# Patient Record
Sex: Male | Born: 1950
Health system: Southern US, Community
[De-identification: ages and names within clinical notes are randomized; demographics above are authoritative.]

## PROBLEM LIST (undated history)

## (undated) DIAGNOSIS — I4891 Unspecified atrial fibrillation: Secondary | ICD-10-CM

## (undated) DIAGNOSIS — N529 Male erectile dysfunction, unspecified: Secondary | ICD-10-CM

## (undated) DIAGNOSIS — G4733 Obstructive sleep apnea (adult) (pediatric): Secondary | ICD-10-CM

## (undated) DIAGNOSIS — I509 Heart failure, unspecified: Secondary | ICD-10-CM

## (undated) DIAGNOSIS — G4700 Insomnia, unspecified: Secondary | ICD-10-CM

## (undated) DIAGNOSIS — J449 Chronic obstructive pulmonary disease, unspecified: Secondary | ICD-10-CM

## (undated) DIAGNOSIS — I519 Heart disease, unspecified: Secondary | ICD-10-CM

## (undated) DIAGNOSIS — I10 Essential (primary) hypertension: Secondary | ICD-10-CM

## (undated) DIAGNOSIS — I447 Left bundle-branch block, unspecified: Secondary | ICD-10-CM

## (undated) HISTORY — PX: VASECTOMY: SHX75

## (undated) HISTORY — PX: OTHER SURGICAL HISTORY: SHX169

## (undated) HISTORY — PX: NASAL SEPTUM SURGERY: SHX37

## (undated) HISTORY — DX: Heart disease, unspecified: I51.9

## (undated) HISTORY — DX: Obstructive sleep apnea (adult) (pediatric): G47.33

---

## 2004-02-06 ENCOUNTER — Ambulatory Visit (HOSPITAL_COMMUNITY): Admission: RE | Admit: 2004-02-06 | Discharge: 2004-02-06 | Payer: Self-pay | Admitting: Gastroenterology

## 2008-11-12 ENCOUNTER — Encounter: Admission: RE | Admit: 2008-11-12 | Discharge: 2008-11-12 | Payer: Self-pay | Admitting: Family Medicine

## 2010-03-24 ENCOUNTER — Encounter: Admission: RE | Admit: 2010-03-24 | Discharge: 2010-03-24 | Payer: Self-pay | Admitting: Family Medicine

## 2011-04-09 NOTE — Op Note (Signed)
NAMEBOSS, DANIELSEN                            ACCOUNT NO.:  [id number]   MEDICAL RECORD NO.:  [id number]                   PATIENT TYPE:  AMB   LOCATION:  ENDO                                 FACILITY:  [facility]   PHYSICIAN:  Graylin Shiver, M.D.                DATE OF BIRTH:  1951/07/06   DATE OF PROCEDURE:  02/06/2004  DATE OF DISCHARGE:                                 OPERATIVE REPORT   PROCEDURE:  Colonoscopy.   INDICATIONS:  Screening.   Informed consent was obtained after explanation of the risks of bleeding,  infection, and perforation.   PREMEDICATION:  Fentanyl 80 mcg IV, Versed 6 mg IV.   PROCEDURE:  With the patient in the left lateral decubitus position, a  rectal exam was performed, no masses were felt.  The Olympus colonoscope was  inserted into the rectum and advanced around the colon to the cecum.  Cecal  landmarks were identified.  The cecum and ascending colon were normal.  The  transverse colon was normal.  The descending colon, sigmoid, and rectum were  normal.  He tolerated the procedure well without complications.   IMPRESSION:  Normal colonoscopy to the cecum.   I would recommend a follow-up screening colonoscopy again in 10 years.                                               Graylin Shiver, M.D.    Germain Osgood  D:  02/06/2004  T:  02/08/2004  Job:  [id number]   cc:   Otilio Connors. Gerri Spore, M.D.  [address]  [place]  [place] [address]  Fax: [phone]

## 2012-08-11 ENCOUNTER — Ambulatory Visit (INDEPENDENT_AMBULATORY_CARE_PROVIDER_SITE_OTHER): Payer: Self-pay | Admitting: Ophthalmology

## 2012-08-25 ENCOUNTER — Ambulatory Visit (INDEPENDENT_AMBULATORY_CARE_PROVIDER_SITE_OTHER): Payer: Self-pay | Admitting: Ophthalmology

## 2012-08-25 ENCOUNTER — Ambulatory Visit (INDEPENDENT_AMBULATORY_CARE_PROVIDER_SITE_OTHER): Payer: BC Managed Care – PPO | Admitting: Ophthalmology

## 2012-08-25 DIAGNOSIS — H3530 Unspecified macular degeneration: Secondary | ICD-10-CM

## 2012-08-25 DIAGNOSIS — H43819 Vitreous degeneration, unspecified eye: Secondary | ICD-10-CM

## 2012-08-25 DIAGNOSIS — H53009 Unspecified amblyopia, unspecified eye: Secondary | ICD-10-CM

## 2012-08-25 DIAGNOSIS — D1809 Hemangioma of other sites: Secondary | ICD-10-CM

## 2012-08-25 DIAGNOSIS — H2510 Age-related nuclear cataract, unspecified eye: Secondary | ICD-10-CM

## 2013-03-26 ENCOUNTER — Ambulatory Visit (INDEPENDENT_AMBULATORY_CARE_PROVIDER_SITE_OTHER): Payer: BC Managed Care – PPO | Admitting: Internal Medicine

## 2013-03-26 DIAGNOSIS — R0602 Shortness of breath: Secondary | ICD-10-CM

## 2013-03-26 LAB — PULMONARY FUNCTION TEST

## 2013-03-26 NOTE — Progress Notes (Signed)
PFT done today. 

## 2013-08-02 ENCOUNTER — Emergency Department (HOSPITAL_COMMUNITY): Payer: BC Managed Care – PPO

## 2013-08-02 ENCOUNTER — Encounter (HOSPITAL_COMMUNITY): Payer: Self-pay | Admitting: Emergency Medicine

## 2013-08-02 ENCOUNTER — Inpatient Hospital Stay (HOSPITAL_COMMUNITY)
Admission: EM | Admit: 2013-08-02 | Discharge: 2013-08-06 | DRG: 544 | Disposition: A | Discharge status: Home / Self Care | Payer: BC Managed Care – PPO | Attending: Interventional Cardiology | Admitting: Interventional Cardiology

## 2013-08-02 DIAGNOSIS — I447 Left bundle-branch block, unspecified: Secondary | ICD-10-CM | POA: Diagnosis present

## 2013-08-02 DIAGNOSIS — G4700 Insomnia, unspecified: Secondary | ICD-10-CM

## 2013-08-02 DIAGNOSIS — J4489 Other specified chronic obstructive pulmonary disease: Secondary | ICD-10-CM | POA: Diagnosis present

## 2013-08-02 DIAGNOSIS — Z87891 Personal history of nicotine dependence: Secondary | ICD-10-CM

## 2013-08-02 DIAGNOSIS — I5021 Acute systolic (congestive) heart failure: Secondary | ICD-10-CM

## 2013-08-02 DIAGNOSIS — I519 Heart disease, unspecified: Secondary | ICD-10-CM

## 2013-08-02 DIAGNOSIS — I4891 Unspecified atrial fibrillation: Secondary | ICD-10-CM

## 2013-08-02 DIAGNOSIS — I10 Essential (primary) hypertension: Secondary | ICD-10-CM | POA: Diagnosis present

## 2013-08-02 DIAGNOSIS — I509 Heart failure, unspecified: Secondary | ICD-10-CM | POA: Diagnosis present

## 2013-08-02 DIAGNOSIS — Z6839 Body mass index (BMI) 39.0-39.9, adult: Secondary | ICD-10-CM

## 2013-08-02 DIAGNOSIS — I5042 Chronic combined systolic (congestive) and diastolic (congestive) heart failure: Secondary | ICD-10-CM | POA: Diagnosis present

## 2013-08-02 DIAGNOSIS — N179 Acute kidney failure, unspecified: Secondary | ICD-10-CM | POA: Diagnosis not present

## 2013-08-02 DIAGNOSIS — T502X5A Adverse effect of carbonic-anhydrase inhibitors, benzothiadiazides and other diuretics, initial encounter: Secondary | ICD-10-CM | POA: Diagnosis not present

## 2013-08-02 DIAGNOSIS — G4730 Sleep apnea, unspecified: Secondary | ICD-10-CM | POA: Diagnosis present

## 2013-08-02 DIAGNOSIS — J449 Chronic obstructive pulmonary disease, unspecified: Secondary | ICD-10-CM | POA: Diagnosis present

## 2013-08-02 DIAGNOSIS — I5031 Acute diastolic (congestive) heart failure: Secondary | ICD-10-CM | POA: Diagnosis present

## 2013-08-02 DIAGNOSIS — R0683 Snoring: Secondary | ICD-10-CM | POA: Diagnosis present

## 2013-08-02 DIAGNOSIS — N529 Male erectile dysfunction, unspecified: Secondary | ICD-10-CM | POA: Diagnosis present

## 2013-08-02 DIAGNOSIS — I480 Paroxysmal atrial fibrillation: Secondary | ICD-10-CM | POA: Diagnosis present

## 2013-08-02 HISTORY — DX: Left bundle-branch block, unspecified: I44.7

## 2013-08-02 HISTORY — DX: Morbid (severe) obesity due to excess calories: E66.01

## 2013-08-02 HISTORY — DX: Unspecified atrial fibrillation: I48.91

## 2013-08-02 HISTORY — DX: Essential (primary) hypertension: I10

## 2013-08-02 HISTORY — DX: Insomnia, unspecified: G47.00

## 2013-08-02 HISTORY — DX: Heart disease, unspecified: I51.9

## 2013-08-02 HISTORY — DX: Male erectile dysfunction, unspecified: N52.9

## 2013-08-02 HISTORY — DX: Chronic obstructive pulmonary disease, unspecified: J44.9

## 2013-08-02 LAB — CBC WITH DIFFERENTIAL/PLATELET
Basophils Absolute: 0 10*3/uL (ref 0.0–0.1)
Basophils Relative: 0 % (ref 0–1)
Eosinophils Absolute: 0 10*3/uL (ref 0.0–0.7)
Eosinophils Relative: 0 % (ref 0–5)
HCT: 39.9 % (ref 39.0–52.0)
Hemoglobin: 14 g/dL (ref 13.0–17.0)
MCH: 31.5 pg (ref 26.0–34.0)
MCHC: 35.1 g/dL (ref 30.0–36.0)
MCV: 89.9 fL (ref 78.0–100.0)
Monocytes Absolute: 0.6 10*3/uL (ref 0.1–1.0)
Monocytes Relative: 4 % (ref 3–12)
Neutro Abs: 12.3 10*3/uL — ABNORMAL HIGH (ref 1.7–7.7)
RDW: 13.2 % (ref 11.5–15.5)

## 2013-08-02 LAB — PROTIME-INR
INR: 1.1 (ref 0.00–1.49)
Prothrombin Time: 14 seconds (ref 11.6–15.2)

## 2013-08-02 LAB — CBC
Hemoglobin: 13.5 g/dL (ref 13.0–17.0)
MCHC: 35.1 g/dL (ref 30.0–36.0)
MCV: 90.8 fL (ref 78.0–100.0)
RBC: 4.24 MIL/uL (ref 4.22–5.81)
RDW: 13.2 % (ref 11.5–15.5)

## 2013-08-02 LAB — BASIC METABOLIC PANEL
BUN: 21 mg/dL (ref 6–23)
CO2: 30 mEq/L (ref 19–32)
GFR calc Af Amer: 70 mL/min — ABNORMAL LOW (ref >90)
GFR calc non Af Amer: 60 mL/min — ABNORMAL LOW (ref >90)
Glucose, Bld: 100 mg/dL — ABNORMAL HIGH (ref 70–99)

## 2013-08-02 LAB — POCT I-STAT TROPONIN I: Troponin i, poc: 0 ng/mL (ref 0.00–0.08)

## 2013-08-02 LAB — COMPREHENSIVE METABOLIC PANEL
BUN: 22 mg/dL (ref 6–23)
CO2: 27 mEq/L (ref 19–32)
Calcium: 9.4 mg/dL (ref 8.4–10.5)
Creatinine, Ser: 1.28 mg/dL (ref 0.50–1.35)
GFR calc Af Amer: 69 mL/min — ABNORMAL LOW (ref >90)
GFR calc non Af Amer: 59 mL/min — ABNORMAL LOW (ref >90)
Glucose, Bld: 201 mg/dL — ABNORMAL HIGH (ref 70–99)
Total Protein: 7.7 g/dL (ref 6.0–8.3)

## 2013-08-02 LAB — MAGNESIUM: Magnesium: 1.9 mg/dL (ref 1.5–2.5)

## 2013-08-02 MED ORDER — ALBUTEROL SULFATE HFA 108 (90 BASE) MCG/ACT IN AERS: 2.0000 | INHALATION_SPRAY | Freq: Four times a day (QID) | RESPIRATORY_TRACT | Status: DC | PRN

## 2013-08-02 MED ORDER — IPRATROPIUM BROMIDE 0.02 % IN SOLN
0.5000 mg | Freq: Once | RESPIRATORY_TRACT | Status: AC
  Administered 2013-08-02: 0.5 mg via RESPIRATORY_TRACT
  Filled 2013-08-02: qty 2.5

## 2013-08-02 MED ORDER — SODIUM CHLORIDE 0.9 % IV SOLN
INTRAVENOUS | Status: DC
  Administered 2013-08-02 – 2013-08-06 (×3): via INTRAVENOUS

## 2013-08-02 MED ORDER — DILTIAZEM HCL 100 MG IV SOLR
5.0000 mg/h | INTRAVENOUS | Status: DC
  Administered 2013-08-02 – 2013-08-03 (×2): 5 mg/h via INTRAVENOUS
  Filled 2013-08-02 (×2): qty 100

## 2013-08-02 MED ORDER — ZOLPIDEM TARTRATE 5 MG PO TABS
5.0000 mg | ORAL_TABLET | Freq: Every evening | ORAL | Status: DC | PRN
  Administered 2013-08-02 – 2013-08-06 (×5): 5 mg via ORAL
  Filled 2013-08-02 (×5): qty 1

## 2013-08-02 MED ORDER — HEPARIN (PORCINE) IN NACL 100-0.45 UNIT/ML-% IJ SOLN
1750.0000 [IU]/h | INTRAMUSCULAR | Status: DC
  Administered 2013-08-02: 1500 [IU]/h via INTRAVENOUS
  Administered 2013-08-03: 10:00:00 1750 [IU]/h via INTRAVENOUS
  Administered 2013-08-03: 05:00:00 1500 [IU]/h via INTRAVENOUS
  Filled 2013-08-02 (×4): qty 250

## 2013-08-02 MED ORDER — HEPARIN BOLUS VIA INFUSION
5000.0000 [IU] | Freq: Once | INTRAVENOUS | Status: AC
  Administered 2013-08-02: 5000 [IU] via INTRAVENOUS
  Filled 2013-08-02: qty 5000

## 2013-08-02 MED ORDER — VALSARTAN-HYDROCHLOROTHIAZIDE 320-25 MG PO TABS: 1.0000 | ORAL_TABLET | Freq: Every day | ORAL | Status: DC

## 2013-08-02 MED ORDER — HYDROCHLOROTHIAZIDE 25 MG PO TABS
25.0000 mg | ORAL_TABLET | Freq: Every day | ORAL | Status: DC
  Filled 2013-08-02: qty 1

## 2013-08-02 MED ORDER — ACETAMINOPHEN 325 MG PO TABS: 650.0000 mg | ORAL_TABLET | ORAL | Status: DC | PRN

## 2013-08-02 MED ORDER — DILTIAZEM LOAD VIA INFUSION
15.0000 mg | Freq: Once | INTRAVENOUS | Status: AC
  Administered 2013-08-02: 18:00:00 15 mg via INTRAVENOUS
  Filled 2013-08-02: qty 15

## 2013-08-02 MED ORDER — ALBUTEROL SULFATE (5 MG/ML) 0.5% IN NEBU
5.0000 mg | INHALATION_SOLUTION | Freq: Once | RESPIRATORY_TRACT | Status: AC
  Administered 2013-08-02: 5 mg via RESPIRATORY_TRACT
  Filled 2013-08-02: qty 1

## 2013-08-02 MED ORDER — FUROSEMIDE 10 MG/ML IJ SOLN
40.0000 mg | Freq: Two times a day (BID) | INTRAMUSCULAR | Status: AC
  Administered 2013-08-02 – 2013-08-03 (×2): 40 mg via INTRAVENOUS
  Filled 2013-08-02 (×2): qty 4

## 2013-08-02 MED ORDER — FLUTICASONE PROPIONATE 50 MCG/ACT NA SUSP
1.0000 | Freq: Every day | NASAL | Status: DC
  Administered 2013-08-03 – 2013-08-06 (×4): 1 via NASAL
  Filled 2013-08-02: qty 16

## 2013-08-02 MED ORDER — ONDANSETRON HCL 4 MG/2ML IJ SOLN: 4.0000 mg | Freq: Four times a day (QID) | INTRAMUSCULAR | Status: DC | PRN

## 2013-08-02 MED ORDER — MOMETASONE FURO-FORMOTEROL FUM 100-5 MCG/ACT IN AERO
2.0000 | INHALATION_SPRAY | Freq: Two times a day (BID) | RESPIRATORY_TRACT | Status: DC
  Administered 2013-08-02 – 2013-08-06 (×8): 2 via RESPIRATORY_TRACT
  Filled 2013-08-02: qty 8.8

## 2013-08-02 MED ORDER — IRBESARTAN 300 MG PO TABS
300.0000 mg | ORAL_TABLET | Freq: Every day | ORAL | Status: DC
  Administered 2013-08-03 – 2013-08-06 (×4): 300 mg via ORAL
  Filled 2013-08-02 (×4): qty 1

## 2013-08-02 MED ORDER — ASPIRIN EC 81 MG PO TBEC
81.0000 mg | DELAYED_RELEASE_TABLET | Freq: Every day | ORAL | Status: DC
  Administered 2013-08-02 – 2013-08-06 (×5): 81 mg via ORAL
  Filled 2013-08-02 (×5): qty 1

## 2013-08-02 NOTE — Progress Notes (Signed)
ANTICOAGULATION CONSULT NOTE - Initial Consult  Pharmacy Consult for heparin Indication: atrial fibrillation  No Known Allergies  Patient Measurements: weight 129 kg, height 70 inches (per pt)   Heparin Dosing Weight: 102kg  Vital Signs: Temp: 98.4 F (36.9 C) (09/11 1646) Temp src: Oral (09/11 1646) BP: 149/100 mmHg (09/11 1646)  Labs:  Recent Labs  08/02/13 1221  HGB 13.5  HCT 38.5*  PLT 250  CREATININE 1.26    CrCl is unknown because there is no height on file for the current visit.   Medical History: Past Medical History  Diagnosis Date  . Hypertension   . Atrial fibrillation with rapid ventricular response 08/02/2013  . Acute diastolic heart failure 08/02/2013    BNP greater than 2000   . Snoring 08/02/2013    Suspect sleep apnea   . Hypertension, essential 08/02/2013  . Morbid obesity 08/02/2013  . Insomnia 08/02/13  . Erectile dysfunction   . COPD (chronic obstructive pulmonary disease)     Moderate by PFTs with response to bronchodilators, May 2014  . Left bundle branch block 08/02/2013    Medications:  See med rec  Assessment: Patient's a 62 y.o M presented to the ED with c/o SOB and now in Afib with RVR. He denies taking any blood thinners at home.  To start heparin for Afib.  Goal of Therapy:  Heparin level 0.3-0.7 units/ml Monitor platelets by anticoagulation protocol: Yes   Plan:  1) heparin 5000 units IV x1 bolus, then heparin drip at 1500 units/hr 2) check 6 hr heparin level  Willa Brocks P 08/02/2013,4:55 PM

## 2013-08-02 NOTE — H&P (Addendum)
Nicholas Foley is a 62 y.o. male  Admit date: 08/02/2013 Referring Physician:  Shaune Pollack, M.D. Primary Cardiologist:: HWB Leia Alf, M.D. Chief complaint / reason for admission: Atrial fibrillation with rapid ventricular response and acute diastolic heart failure  HPI: Very pleasant 62 year old gentleman with a history of COPD who suddenly developed weakness, dyspnea, and fatigue 5 days prior to admission. Spoke to his primary physician and a steroid Dosepak which tried without improvement. It was assumed that he was having an exacerbation of COPD. Because he didn't feel better he came to the [facility] Emergency Room. In the emergency room evaluation identified tachycardia diagnoses atrial fibrillation. He is also noted to have an elevated BNP. He has not been admitted to the hospital with a presumed to 5 day history of more of atrial flutter ablation causing decompensated acute diastolic heart failure.    PMH:    Past Medical History  Diagnosis Date  . Hypertension   . COPD (chronic obstructive pulmonary disease)   . Atrial fibrillation with rapid ventricular response 08/02/2013  . Acute diastolic heart failure 08/02/2013    BNP greater than 2000   . Snoring 08/02/2013    Suspect sleep apnea   . Hypertension, essential 08/02/2013  . Morbid obesity 08/02/2013  . Insomnia 08/02/13  . Erectile dysfunction     PSH:    Past Surgical History  Procedure Laterality Date  . Nasal septum surgery    . Vasectomy      ALLERGIES:   Review of patient's allergies indicates no known allergies.  Prior to Admit Meds:   (Not in a hospital admission) Family HX:    Family History  Problem Relation Age of Onset  . Atrial fibrillation Mother   . COPD Father   . Cerebral aneurysm Brother    Social HX:    History   Social History  . Marital Status: Married    Spouse Name: N/A    Number of Children: N/A  . Years of Education: N/A   Occupational History  . Not on file.   Social History Main  Topics  . Smoking status: Former Smoker -- 1.00 packs/day for 30 years    Types: Cigarettes, Cigars    Quit date: 08/02/2012  . Smokeless tobacco: Not on file  . Alcohol Use: Yes     Comment: occ  . Drug Use: No  . Sexual Activity: Yes   Other Topics Concern  . Not on file   Social History Narrative  . No narrative on file     ROS: ACE inhibitors cause cough. He denies diabetes, stroke, heart disease, liver disease, bleeding, ulcer, arthritis, back trouble, skin illness, and kidney disease.  Physical Exam:   Obese male lying at 47 on the hospital gurney in no acute distress. Blood pressure 142/90 heart rate 110 beats per minute O2 saturation 93% on room air  Skin color is normal. The patient has abated. HEENT exam is grossly unremarkable.  Neck exam is difficult to evaluate because of obesity. No carotid bruits are heard. Carotid upstroke is 2+.  Chest is clear anteriorly and posteriorly. No wheezing is heard.  Cardiac exam reveals a rapid irregularly irregular rhythm. No murmur or rub.  Abdomen is obese, nontender, and has positive bowel sounds.  Extremities reveal no edema. Posterior tibial pulses are 2+ and symmetric.  Neurological exam is unremarkable.   Labs:   Lab Results  Component Value Date   WBC 15.1* 08/02/2013   HGB 13.5 08/02/2013  HCT 38.5* 08/02/2013   MCV 90.8 08/02/2013   PLT 250 08/02/2013     Recent Labs Lab 08/02/13 1221  NA 140  K 3.8  CL 100  CO2 30  BUN 21  CREATININE 1.26  CALCIUM 9.2  GLUCOSE 100*   BNP    Component Value Date/Time   PROBNP 2865.0* 08/02/2013 1221      Radiology:  EXAM:  CHEST 2 VIEW  COMPARISON: 03/24/2010  FINDINGS:  There is hyperinflation of the lungs compatible with COPD. Heart is  upper limits normal in size. Mild peribronchial thickening. No  confluent opacities or effusions. No acute bony abnormality.  IMPRESSION:  COPD, mild bronchitis.  Electronically Signed  By: Charlett Nose M.D.  On:  08/02/2013 13:21    EKG:  Atrial fibrillation, rapid ventricular response to 110 beats per minute, left bundle branch block.  ASSESSMENT:  1. Atrial fibrillation with rapid ventricular response, unknown duration but present at least 5 days.  2. Acute diastolic heart failure, presumed. Rule out systolic dysfunction given left bundle branch block on EKG.  3. New left bundle branch block  4. Suspect sleep apnea  5. Hypertension  6. Morbid obesity  7. COPD  Plan:  1. IV diltiazem for rate control  2. IV diuresis  3. 2-D Doppler echocardiogram  4. Systemic anticoagulation  5. Consider TE cardioversion depending upon clinical course.  Lesleigh Noe 08/02/2013 4:37 PM

## 2013-08-02 NOTE — ED Notes (Signed)
Pt c/o increased SOB x 1 week worse with inspiration; pt denies pain; pt st head congestion

## 2013-08-02 NOTE — ED Notes (Signed)
Patient reports a history of COPD.  States that he has been dyspnic for a week.  He was treated by his PCP with a prednisone dose pak.  His dyspnea has not improved.  His PCP advised that he come to the ED for evaluation.  Patient denies chest pain.  Placed on oxygen at 2 L/M per nasal cannula.

## 2013-08-02 NOTE — ED Provider Notes (Signed)
CSN: [id number]     Arrival date & time 08/02/13  1150 History  First MD Initiated Contact with Patient 08/02/13 1203     Chief Complaint  Patient presents with  . Shortness of Breath   HPI Patient presents emergency room with complaints of shortness of breath that has increasing over the past week. Patient does have history of COPD. He quit smoking cigarettes several years ago and cigars at least 4 months ago. He does use Advair regularly and albuterol very sporadically.  Patient states that every year or so to have a mild exacerbation that is triggered by a upper respiratory infection. Patient states he has not really noticed any of those symptoms this year. He just started feeling short of breath. He went and saw his primary doctor who empirically treated him with a steroid dose pack. Patient thinks that has helped decrease some of the wheezing he was having earlier but he still feels short of breath. He notices it even more so at night when he lies flat. He denies any chest pain. He denies any fevers. He denies any nasal congestion. He denies any leg swelling recent trips or travel. Past Medical History  Diagnosis Date  . Hypertension   . COPD (chronic obstructive pulmonary disease)    History reviewed. No pertinent past surgical history. History reviewed. No pertinent family history. History  Substance Use Topics  . Smoking status: Former Games developer  . Smokeless tobacco: Not on file  . Alcohol Use: Yes     Comment: occ    Review of Systems  All other systems reviewed and are negative.    Allergies  Review of patient's allergies indicates no known allergies.  Home Medications   Current Outpatient Rx  Name  Route  Sig  Dispense  Refill  . acetaminophen (TYLENOL) 650 MG CR tablet   Oral   Take 650 mg by mouth every 8 (eight) hours as needed for pain.         [name] ADVAIR DISKUS 250-50 MCG/DOSE AEPB   Inhalation   Inhale 2 puffs into the lungs 2 (two) times daily.         [name]  albuterol (PROVENTIL HFA;VENTOLIN HFA) 108 (90 BASE) MCG/ACT inhaler   Inhalation   Inhale 2 puffs into the lungs every 6 (six) hours as needed for wheezing or shortness of breath.         [name] aspirin EC 81 MG tablet   Oral   Take 81 mg by mouth daily.         . Cholecalciferol (VITAMIN D PO)   Oral   Take 1 tablet by mouth daily.         [name] NASONEX 50 MCG/ACT nasal spray   Nasal   Place 2 sprays into the nose daily.         . predniSONE (DELTASONE) 20 MG tablet   Oral   Take 20-60 mg by mouth See admin instructions. Take 3 tablets daily for 3 days, 2 tablets daily for 2 days, and 1 tablet for 2 days then stop         . valsartan-hydrochlorothiazide (DIOVAN-HCT) 320-25 MG per tablet   Oral   Take 1 tablet by mouth daily.         [name] VIAGRA 100 MG tablet   Oral   Take 100 mg by mouth daily as needed. For intercourse         . zolpidem (AMBIEN CR) 12.5 MG CR tablet   Oral  Take 1 tablet by mouth at bedtime as needed. For sleep          There were no vitals taken for this visit. Physical Exam  Nursing note and vitals reviewed. Constitutional: No distress.  Overweight  HENT:  Head: Normocephalic and atraumatic.  Right Ear: External ear normal.  Left Ear: External ear normal.  Eyes: Conjunctivae are normal. Right eye exhibits no discharge. Left eye exhibits no discharge. No scleral icterus.  Neck: Neck supple. No tracheal deviation present.  Cardiovascular: Normal rate, regular rhythm and intact distal pulses.   Pulmonary/Chest: Effort normal. No stridor. No respiratory distress. He has no wheezes. He has no rales.  Slightly decreased breath sounds bilaterally  Abdominal: Soft. Bowel sounds are normal. He exhibits no distension. There is no tenderness. There is no rebound and no guarding.  Musculoskeletal: He exhibits no edema and no tenderness.  Neurological: He is alert. He has normal strength. No sensory deficit. Cranial nerve deficit:  no gross defecits  noted. He exhibits normal muscle tone. He displays no seizure activity. Coordination normal.  Skin: Skin is warm and dry. No rash noted.  Psychiatric: He has a normal mood and affect.    ED Course  Procedures (including critical care time) EKG  Rate 110 Atrial fibrillation Left bundle branch block Abnormal ECG No old tracing to compare  Labs Review Labs Reviewed  BASIC METABOLIC PANEL - Abnormal; Notable for the following:    Glucose, Bld 100 (*)    GFR calc non Af Amer 60 (*)    GFR calc Af Amer 70 (*)    All other components within normal limits  CBC - Abnormal; Notable for the following:    WBC 15.1 (*)    HCT 38.5 (*)    All other components within normal limits  PRO B NATRIURETIC PEPTIDE - Abnormal; Notable for the following:    Pro B Natriuretic peptide (BNP) 2865.0 (*)    All other components within normal limits  D-DIMER, QUANTITATIVE  POCT I-STAT TROPONIN I   Imaging Review Dg Chest 2 View  08/02/2013   CLINICAL DATA:  Shortness of breath, COPD.  EXAM: CHEST  2 VIEW  COMPARISON:  03/24/2010  FINDINGS: There is hyperinflation of the lungs compatible with COPD. Heart is upper limits normal in size. Mild peribronchial thickening. No confluent opacities or effusions. No acute bony abnormality.  IMPRESSION: COPD, mild bronchitis.   Electronically Signed   By: Charlett Nose M.D.   On: 08/02/2013 13:21    MDM   1. Atrial fibrillation   2. Congestive heart failure     Patient's EKG shows new onset atrial fibrillation. He is mildly tachycardic but has not required any rate controlling agents.  His BNP is elevated he likely has a mild component of congestive heart failure as the source of his dyspnea along with his atrial fibrillation.  I will consult with the cardiology service regarding possible admission and further treatment.    Celene Kras, MD 08/02/13 [phone]

## 2013-08-03 LAB — CBC
HCT: 38.8 % — ABNORMAL LOW (ref 39.0–52.0)
Hemoglobin: 13.7 g/dL (ref 13.0–17.0)
MCH: 31.8 pg (ref 26.0–34.0)
MCHC: 35.3 g/dL (ref 30.0–36.0)
MCV: 90 fL (ref 78.0–100.0)
RDW: 13.2 % (ref 11.5–15.5)

## 2013-08-03 LAB — LIPID PANEL
Cholesterol: 184 mg/dL (ref 0–200)
LDL Cholesterol: 104 mg/dL — ABNORMAL HIGH (ref 0–99)
Triglycerides: 149 mg/dL (ref <150)
VLDL: 30 mg/dL (ref 0–40)

## 2013-08-03 LAB — BASIC METABOLIC PANEL
CO2: 29 mEq/L (ref 19–32)
Calcium: 9.2 mg/dL (ref 8.4–10.5)
Creatinine, Ser: 1.28 mg/dL (ref 0.50–1.35)
GFR calc non Af Amer: 59 mL/min — ABNORMAL LOW (ref >90)
Sodium: 139 mEq/L (ref 135–145)

## 2013-08-03 LAB — HEPARIN LEVEL (UNFRACTIONATED): Heparin Unfractionated: 0.26 IU/mL — ABNORMAL LOW (ref 0.30–0.70)

## 2013-08-03 LAB — HEMOGLOBIN A1C: Mean Plasma Glucose: 117 mg/dL — ABNORMAL HIGH (ref <117)

## 2013-08-03 MED ORDER — DILTIAZEM HCL 100 MG IV SOLR
5.0000 mg/h | INTRAVENOUS | Status: AC
  Filled 2013-08-03: qty 100

## 2013-08-03 MED ORDER — DILTIAZEM HCL ER COATED BEADS 240 MG PO CP24
240.0000 mg | ORAL_CAPSULE | Freq: Every day | ORAL | Status: DC
  Administered 2013-08-03 – 2013-08-06 (×4): 240 mg via ORAL
  Filled 2013-08-03 (×4): qty 1

## 2013-08-03 MED ORDER — HEPARIN BOLUS VIA INFUSION
2000.0000 [IU] | Freq: Once | INTRAVENOUS | Status: AC
  Administered 2013-08-03: 2000 [IU] via INTRAVENOUS
  Filled 2013-08-03: qty 2000

## 2013-08-03 MED ORDER — METOPROLOL TARTRATE 25 MG PO TABS
25.0000 mg | ORAL_TABLET | Freq: Two times a day (BID) | ORAL | Status: DC
  Administered 2013-08-03 – 2013-08-05 (×5): 25 mg via ORAL
  Filled 2013-08-03 (×6): qty 1

## 2013-08-03 MED ORDER — POTASSIUM CHLORIDE CRYS ER 20 MEQ PO TBCR
40.0000 meq | EXTENDED_RELEASE_TABLET | Freq: Two times a day (BID) | ORAL | Status: AC
  Administered 2013-08-03 (×2): 40 meq via ORAL
  Filled 2013-08-03 (×2): qty 2

## 2013-08-03 MED ORDER — POTASSIUM CHLORIDE CRYS ER 20 MEQ PO TBCR
20.0000 meq | EXTENDED_RELEASE_TABLET | Freq: Every day | ORAL | Status: DC
  Administered 2013-08-04: 11:00:00 20 meq via ORAL
  Filled 2013-08-03 (×2): qty 1

## 2013-08-03 MED ORDER — HEPARIN (PORCINE) IN NACL 100-0.45 UNIT/ML-% IJ SOLN
2150.0000 [IU]/h | INTRAMUSCULAR | Status: DC
  Administered 2013-08-03 – 2013-08-04 (×2): 2000 [IU]/h via INTRAVENOUS
  Administered 2013-08-05: 13:00:00 2150 [IU]/h via INTRAVENOUS
  Administered 2013-08-05: 2000 [IU]/h via INTRAVENOUS
  Administered 2013-08-06: 02:00:00 2150 [IU]/h via INTRAVENOUS
  Filled 2013-08-03 (×8): qty 250

## 2013-08-03 MED ORDER — FUROSEMIDE 40 MG PO TABS
40.0000 mg | ORAL_TABLET | Freq: Every day | ORAL | Status: DC
  Administered 2013-08-04 – 2013-08-05 (×2): 40 mg via ORAL
  Filled 2013-08-03 (×2): qty 1

## 2013-08-03 NOTE — Progress Notes (Signed)
ANTICOAGULATION CONSULT NOTE - Follow Up Consult  Pharmacy Consult for heparin Indication: atrial fibrillation  Labs:  Recent Labs  08/02/13 0020  08/02/13 1221 08/02/13 1751 08/02/13 1830 08/03/13 0640  HGB  --   < > 13.5  --  14.0 13.7  HCT  --   --  38.5*  --  39.9 38.8*  PLT  --   --  250  --  238 233  LABPROT  --   --   --   --  14.0  --   INR  --   --   --   --  1.10  --   HEPARINUNFRC 0.40  --   --   --   --  0.18*  CREATININE  --   --  1.26  --  1.28 1.28  TROPONINI <0.30  --   --  <0.30  --  <0.30  < > = values in this interval not displayed.  Assessment/Plan:  62yo male therapeutic on heparin with initial dosing for Afib, HL now sub-therapeutic at 0.18, no bleeding noted  1) Heparin 2000 units iv bolus x 1 2) Heparin drip to 1750 units / hr 3) 6 hr heparin level  Thank you. Okey Regal, PharmD [phone]  08/03/2013,8:51 AM

## 2013-08-03 NOTE — Progress Notes (Signed)
Pt a/o, independent, no c/o pain, pt ambulated in hallway no c/o SOB, pt on po Cardizem, will continue to monitor

## 2013-08-03 NOTE — Progress Notes (Signed)
Pt was on Cardizem gtt at 32ml/hr, pt given po dose at 0942, gtt d/c'd at 1122

## 2013-08-03 NOTE — Progress Notes (Signed)
ANTICOAGULATION CONSULT NOTE - Follow Up Consult  Pharmacy Consult for heparin Indication: atrial fibrillation  Labs:  Recent Labs  08/02/13 0020 08/02/13 1221 08/02/13 1751 08/02/13 1830  HGB  --  13.5  --  14.0  HCT  --  38.5*  --  39.9  PLT  --  250  --  238  LABPROT  --   --   --  14.0  INR  --   --   --  1.10  HEPARINUNFRC 0.40  --   --   --   CREATININE  --  1.26  --  1.28  TROPONINI <0.30  --  <0.30  --     Assessment/Plan:  62yo male therapeutic on heparin with initial dosing for Afib (results reported as 9/11 early am but truly reported at 0020 on 9/12).  Will continue gtt at current rate and confirm stable with am labs.  Vernard Gambles, PharmD, BCPS  08/03/2013,1:39 AM

## 2013-08-03 NOTE — Progress Notes (Addendum)
The left ventricular systolic function is significantly depressed: Study Conclusions  - Left ventricle: The cavity size was normal. Wall thickness was increased in a pattern of mild LVH. Systolic function was moderately to severely reduced. The estimated ejection fraction was in the range of 30% to 35%. Diffuse hypokinesis. - Mitral valve: Mild regurgitation. - Left atrium: The atrium was mildly dilated.  CONCLUSIONS: Likely tachycardia induced left ventricular systolic dysfunction given normal LV diameter and preserved wall thickness.  Heart failure is acute systolic in nature and has significantly improved with diuresis and weight control.  PLAN: I would recommend keeping the patient over the weekend and planning for a transesophageal echo guided cardioversion on Monday. This would help him return to normal function more rapidly. My inclination would be to add an  antiarrhythmic therapy over the weekend (?amio) to enhance the likelihood of maintaining normal sinus rhythm at the time of cardioversion. Will leave this to the discretion of the rounding physician .

## 2013-08-03 NOTE — Progress Notes (Signed)
  Echocardiogram 2D Echocardiogram has been performed.  Nicholas Foley Nicholas Foley 08/03/2013, 5:21 PM

## 2013-08-03 NOTE — Progress Notes (Addendum)
Patient ID: ROMELL WOLDEN, male   DOB: 1951/06/11, 62 y.o.   MRN: [id number] Patient Name: Nicholas Foley Date of Encounter: 08/03/2013    SUBJECTIVE: Feels much better this morning. Breathing much better. Had marked diuresis last night and is 2500 cc negative. No chest pain. No dizziness. Has exertional fatigue. Walking in the hall increase his heart rate to 110 beats per minute  TELEMETRY:  Atrial fib with controlled rate at rest but increasing quickly greater than 110 with activity: Filed Vitals:   08/02/13 2031 08/03/13 0100 08/03/13 0658 08/03/13 0819  BP: 144/84 130/69 144/84   Pulse: 91 71 73   Temp: 97.9 F (36.6 C) 97.8 F (36.6 C) 96.9 F (36.1 C)   TempSrc: Oral Oral Oral   Resp: 18 18 18    Height:      Weight:   126.735 kg (279 lb 6.4 oz)   SpO2: 95% 98% 99% 99%    Intake/Output Summary (Last 24 hours) at 08/03/13 0855 Last data filed at 08/03/13 0519  Gross per 24 hour  Intake    240 ml  Output   2800 ml  Net  -2560 ml    LABS: Basic Metabolic Panel:  Recent Labs  40/98/11 1221 08/02/13 1830 08/03/13 0640  NA 140 138 139  K 3.8 4.0 3.3*  CL 100 97 97  CO2 30 27 29   GLUCOSE 100* 201* 80  BUN 21 22 22   CREATININE 1.26 1.28 1.28  CALCIUM 9.2 9.4 9.2  MG  --  1.9  --    CBC:  Recent Labs  08/02/13 1221 08/02/13 1830 08/03/13 0640  WBC 15.1* 14.1* 11.2*  NEUTROABS  --  12.3*  --   HGB 13.5 14.0 13.7  HCT 38.5* 39.9 38.8*  MCV 90.8 89.9 90.0  PLT 250 238 233   Cardiac Enzymes:  Recent Labs  08/02/13 0020 08/02/13 1751 08/03/13 0640  TROPONINI <0.30 <0.30 <0.30   BNP: No components found with this basename: POCBNP,  Hemoglobin A1C:  Recent Labs  08/02/13 1830  HGBA1C 5.7*   Fasting Lipid Panel:  Recent Labs  08/03/13 0640  CHOL 184  HDL 50  LDLCALC 104*  TRIG 149  CHOLHDL 3.7    Radiology/Studies:  No new  Physical Exam: Blood pressure 144/84, pulse 73, temperature 96.9 F (36.1 C), temperature source Oral, resp.  rate 18, height 5\' 10"  (1.778 m), weight 126.735 kg (279 lb 6.4 oz), SpO2 99.00%. Weight change:    Irregularly irregular rhythm. No gallop.  Decreased breath sounds at both bases.  No peripheral edema.  ASSESSMENT:  1. Atrial fibrillation, unknown duration, historically perhaps as long as 2-3 months.  2. Acute heart failure, suspect diastolic but cannot rule out systolic dysfunction. Improved with IV Lasix.  3. New left bundle branch block  4. COPD  5. Probable undiagnosed sleep apnea   Plan:  1. Convert IV diltiazem to oral Cardizem and beta blocker therapy  2. 2-D echocardiogram as soon as possible today.  3. Start Apixaban assuming LVEF is not lower than 35%. If LVEF is less than 35% we will pursue TEE guided cardioversion early next week. Otherwise will do outpatient cardioversion in 4 weeks.  4. Discontinue hydrochlorothiazide and start oral Lasix  5. Discharge tomorrow if all oral medications and doing well.  Detailed discussion with patient. Visit lasting greater than 35 minutes.  Selinda Eon 08/03/2013, 8:55 AM

## 2013-08-03 NOTE — Progress Notes (Signed)
ANTICOAGULATION CONSULT NOTE - Follow Up Consult  Pharmacy Consult for Heparin Indication: atrial fibrillation  No Known Allergies  Patient Measurements: Height: 5\' 10"  (177.8 cm) Weight: 279 lb 6.4 oz (126.735 kg) (scale b) IBW/kg (Calculated) : 73 Heparin Dosing Weight: ~ 102kg  Vital Signs: Temp: 97.8 F (36.6 C) (09/12 1501) Temp src: Oral (09/12 1501) BP: 110/70 mmHg (09/12 1501) Pulse Rate: 54 (09/12 1501)  Labs:  Recent Labs  08/02/13 0020  08/02/13 1221 08/02/13 1751 08/02/13 1830 08/03/13 0640 08/03/13 1723  HGB  --   < > 13.5  --  14.0 13.7  --   HCT  --   --  38.5*  --  39.9 38.8*  --   PLT  --   --  250  --  238 233  --   LABPROT  --   --   --   --  14.0  --   --   INR  --   --   --   --  1.10  --   --   HEPARINUNFRC 0.40  --   --   --   --  0.18* 0.26*  CREATININE  --   --  1.26  --  1.28 1.28  --   TROPONINI <0.30  --   --  <0.30  --  <0.30  --   < > = values in this interval not displayed.  Estimated Creatinine Clearance: 82 ml/min (by C-G formula based on Cr of 1.28).   Medications:  Heparin @ 1750 units/hr  Assessment: 62yom continues on heparin for afib. Heparin level is still subthearpeutic despite re-bolus and rate increase this morning. No bleeding reported. Received consult earlier today to start apixaban if EF>35%, however, ECHO today revealed EF 30-35%, so heparin to continue with plans for TEE/DCCV on Monday.  Goal of Therapy:  Heparin level 0.3-0.7 units/ml Monitor platelets by anticoagulation protocol: Yes   Plan:  1) Increase heparin to 2000 units/hr 2) Check 6 hour heparin level  Fredrik Rigger 08/03/2013,5:58 PM

## 2013-08-04 LAB — CBC
MCV: 92 fL (ref 78.0–100.0)
Platelets: 224 10*3/uL (ref 150–400)
RBC: 4.51 MIL/uL (ref 4.22–5.81)
RDW: 13.5 % (ref 11.5–15.5)
WBC: 9 10*3/uL (ref 4.0–10.5)

## 2013-08-04 LAB — HEPARIN LEVEL (UNFRACTIONATED)
Heparin Unfractionated: 0.52 IU/mL (ref 0.30–0.70)
Heparin Unfractionated: 0.46 IU/mL (ref 0.30–0.70)

## 2013-08-04 LAB — BASIC METABOLIC PANEL
CO2: 31 mEq/L (ref 19–32)
Chloride: 96 mEq/L (ref 96–112)
GFR calc Af Amer: 63 mL/min — ABNORMAL LOW (ref >90)
Potassium: 3.9 mEq/L (ref 3.5–5.1)

## 2013-08-04 NOTE — Progress Notes (Signed)
ANTICOAGULATION CONSULT NOTE - Follow Up Consult  Pharmacy Consult for heparin Indication: atrial fibrillation  Labs:  Recent Labs  08/02/13 0020  08/02/13 1221 08/02/13 1751 08/02/13 1830 08/03/13 0640 08/03/13 1723 08/04/13 0140  HGB  --   < > 13.5  --  14.0 13.7  --   --   HCT  --   --  38.5*  --  39.9 38.8*  --   --   PLT  --   --  250  --  238 233  --   --   LABPROT  --   --   --   --  14.0  --   --   --   INR  --   --   --   --  1.10  --   --   --   HEPARINUNFRC 0.40  --   --   --   --  0.18* 0.26* 0.52  CREATININE  --   --  1.26  --  1.28 1.28  --   --   TROPONINI <0.30  --   --  <0.30  --  <0.30  --   --   < > = values in this interval not displayed.  Assessment/Plan:  62yo male now therapeutic on heparin with after rate increases.  Will continue gtt at current rate and confirm stable with additional level.  Vernard Gambles, PharmD, BCPS  08/04/2013,2:40 AM

## 2013-08-04 NOTE — Progress Notes (Signed)
ANTICOAGULATION CONSULT NOTE - Follow Up Consult  Pharmacy Consult for Heparin Indication: atrial fibrillation  No Known Allergies  Labs:  Recent Labs  08/02/13 0020  08/02/13 1221 08/02/13 1751 08/02/13 1830 08/03/13 0640 08/03/13 1723 08/04/13 0140 08/04/13 0445 08/04/13 0845  HGB  --   < > 13.5  --  14.0 13.7  --   --  14.0  --   HCT  --   < > 38.5*  --  39.9 38.8*  --   --  41.5  --   PLT  --   < > 250  --  238 233  --   --  224  --   LABPROT  --   --   --   --  14.0  --   --   --   --   --   INR  --   --   --   --  1.10  --   --   --   --   --   HEPARINUNFRC 0.40  --   --   --   --  0.18* 0.26* 0.52  --  0.46  CREATININE  --   < > 1.26  --  1.28 1.28  --   --  1.38*  --   TROPONINI <0.30  --   --  <0.30  --  <0.30  --   --   --   --   < > = values in this interval not displayed.  Estimated Creatinine Clearance: 75.8 ml/min (by C-G formula based on Cr of 1.38).  Assessment: 62yom continues on heparin for afib.  Heparin level now therapeutic -  Plans for TEE/DCCV on Monday.  No bleeding noted  Goal of Therapy:  Heparin level 0.3-0.7 units/ml Monitor platelets by anticoagulation protocol: Yes   Plan:  1) Continue heparin at 2000 units/hr 2) Follow up AM labs  Thank you. Okey Regal, PharmD [phone]  08/04/2013,10:27 AM

## 2013-08-04 NOTE — Progress Notes (Signed)
Pt. Alert oriented X 4, pt. states he slept for the 1st time in days last night, patient denies pain or discomfort, pt. Seems to be voiding adequately with the lasix medication.

## 2013-08-04 NOTE — Progress Notes (Addendum)
SUBJECTIVE:  No complaints  OBJECTIVE:   Vitals:   Filed Vitals:   08/03/13 1501 08/03/13 2026 08/03/13 2049 08/04/13 0435  BP: 110/70 122/77  134/83  Pulse: 54 72  75  Temp: 97.8 F (36.6 C) 98.1 F (36.7 C)  97.4 F (36.3 C)  TempSrc: Oral Oral  Oral  Resp: 18 18  18   Height:      Weight:    125.737 kg (277 lb 3.2 oz)  SpO2: 98% 97% 98% 98%   I&O's:   Intake/Output Summary (Last 24 hours) at 08/04/13 0820 Last data filed at 08/04/13 0700  Gross per 24 hour  Intake 1488.41 ml  Output   3000 ml  Net -1511.59 ml   TELEMETRY: Reviewed telemetry pt in atrial fibrillation:     PHYSICAL EXAM General: Well developed, well nourished, in no acute distress Head: Eyes PERRLA, No xanthomas.   Normal cephalic and atramatic  Lungs:   Clear bilaterally to auscultation and percussion. Heart:   Irregularly irrgular S1 S2 Pulses are 2+ & equal. Abdomen: Bowel sounds are positive, abdomen soft and non-tender without masses Extremities:   No clubbing, cyanosis or edema.  DP +1 Neuro: Alert and oriented X 3. Psych:  Good affect, responds appropriately   LABS: Basic Metabolic Panel:  Recent Labs  16/10/96 1221 08/02/13 1830 08/03/13 0640 08/04/13 0445  NA 140 138 139 136  K 3.8 4.0 3.3* 3.9  CL 100 97 97 96  CO2 30 27 29 31   GLUCOSE 100* 201* 80 80  BUN 21 22 22  25*  CREATININE 1.26 1.28 1.28 1.38*  CALCIUM 9.2 9.4 9.2 8.9  MG  --  1.9  --   --    Liver Function Tests:  Recent Labs  08/02/13 1830  AST 14  ALT 25  ALKPHOS 46  BILITOT 0.4  PROT 7.7  ALBUMIN 3.9   No results found for this basename: LIPASE, AMYLASE,  in the last 72 hours CBC:  Recent Labs  08/02/13 1221 08/02/13 1830 08/03/13 0640 08/04/13 0445  WBC 15.1* 14.1* 11.2* 9.0  NEUTROABS  --  12.3*  --   --   HGB 13.5 14.0 13.7 14.0  HCT 38.5* 39.9 38.8* 41.5  MCV 90.8 89.9 90.0 92.0  PLT 250 238 233 224   Cardiac Enzymes:  Recent Labs  08/02/13 0020 08/02/13 1751 08/03/13 0640   TROPONINI <0.30 <0.30 <0.30   BNP: No components found with this basename: POCBNP,  D-Dimer:  Recent Labs  08/02/13 1221  DDIMER <0.27   Hemoglobin A1C:  Recent Labs  08/02/13 1830  HGBA1C 5.7*   Fasting Lipid Panel:  Recent Labs  08/03/13 0640  CHOL 184  HDL 50  LDLCALC 104*  TRIG 149  CHOLHDL 3.7   Thyroid Function Tests:  Recent Labs  08/02/13 1830  TSH 0.370   Anemia Panel: No results found for this basename: VITAMINB12, FOLATE, FERRITIN, TIBC, IRON, RETICCTPCT,  in the last 72 hours Coag Panel:   Lab Results  Component Value Date   INR 1.10 08/02/2013    RADIOLOGY: Dg Chest 2 View  08/02/2013   CLINICAL DATA:  Shortness of breath, COPD.  EXAM: CHEST  2 VIEW  COMPARISON:  03/24/2010  FINDINGS: There is hyperinflation of the lungs compatible with COPD. Heart is upper limits normal in size. Mild peribronchial thickening. No confluent opacities or effusions. No acute bony abnormality.  IMPRESSION: COPD, mild bronchitis.   Electronically Signed   By: Charlett Nose M.D.  On: 08/02/2013 13:21   ASSESSMENT:  1. Atrial fibrillation, unknown duration, historically perhaps as long as 2-3 months.  2. Acute systolic heart failure 3. New left bundle branch block  4. COPD  5. Probable undiagnosed sleep apnea   Plan:  1.  Continue Cardizem and Metoprolol 2.  Continue IV Heparin gtt - per Dr. Katrinka Blazing will start Eliquis once he is cardioverted 3.  TEE [facility] on Monday   Quintella Reichert, MD  08/04/2013  8:20 AM

## 2013-08-05 LAB — BASIC METABOLIC PANEL
CO2: 28 mEq/L (ref 19–32)
Calcium: 8.8 mg/dL (ref 8.4–10.5)
Chloride: 100 mEq/L (ref 96–112)
GFR calc non Af Amer: 60 mL/min — ABNORMAL LOW (ref >90)
Glucose, Bld: 89 mg/dL (ref 70–99)
Glucose, Bld: 116 mg/dL — ABNORMAL HIGH (ref 70–99)
Potassium: 5.2 mEq/L — ABNORMAL HIGH (ref 3.5–5.1)
Potassium: 4.1 mEq/L (ref 3.5–5.1)
Sodium: 136 mEq/L (ref 135–145)
Sodium: 137 mEq/L (ref 135–145)

## 2013-08-05 LAB — CBC
Hemoglobin: 14.2 g/dL (ref 13.0–17.0)
Platelets: 222 10*3/uL (ref 150–400)
RBC: 4.56 MIL/uL (ref 4.22–5.81)
WBC: 9.1 10*3/uL (ref 4.0–10.5)

## 2013-08-05 MED ORDER — AMIODARONE HCL 200 MG PO TABS
400.0000 mg | ORAL_TABLET | Freq: Two times a day (BID) | ORAL | Status: DC
  Administered 2013-08-05 – 2013-08-06 (×3): 400 mg via ORAL
  Filled 2013-08-05 (×5): qty 2

## 2013-08-05 MED ORDER — FUROSEMIDE 40 MG PO TABS
40.0000 mg | ORAL_TABLET | Freq: Two times a day (BID) | ORAL | Status: DC
  Administered 2013-08-05: 40 mg via ORAL
  Filled 2013-08-05 (×4): qty 1

## 2013-08-05 MED ORDER — SODIUM CHLORIDE 0.9 % IV SOLN: INTRAVENOUS | Status: DC

## 2013-08-05 MED ORDER — METOPROLOL TARTRATE 25 MG PO TABS
25.0000 mg | ORAL_TABLET | Freq: Two times a day (BID) | ORAL | Status: DC
  Administered 2013-08-05: 25 mg via ORAL
  Filled 2013-08-05 (×3): qty 1

## 2013-08-05 NOTE — Plan of Care (Signed)
Problem: Phase I Progression Outcomes Goal: EF % per last Echo/documented,Core Reminder form on chart Outcome: Completed/Met Date Met:  08/05/13 30-35%(08-03-13

## 2013-08-05 NOTE — Progress Notes (Signed)
ANTICOAGULATION CONSULT NOTE - Follow Up Consult  Pharmacy Consult for Heparin Indication: atrial fibrillation  No Known Allergies  Labs:  Recent Labs  08/02/13 1221 08/02/13 1751 08/02/13 1830 08/03/13 0640  08/04/13 0140 08/04/13 0445 08/04/13 0845 08/05/13 0530 08/05/13 0810  HGB 13.5  --  14.0 13.7  --   --  14.0  --  14.2  --   HCT 38.5*  --  39.9 38.8*  --   --  41.5  --  41.6  --   PLT 250  --  238 233  --   --  224  --  222  --   LABPROT  --   --  14.0  --   --   --   --   --   --   --   INR  --   --  1.10  --   --   --   --   --   --   --   HEPARINUNFRC  --   --   --  0.18*  < > 0.52  --  0.46 0.32  --   CREATININE 1.26  --  1.28 1.28  --   --  1.38*  --  1.25 1.27  TROPONINI  --  <0.30  --  <0.30  --   --   --   --   --   --   < > = values in this interval not displayed.  Estimated Creatinine Clearance: 82.3 ml/min (by C-G formula based on Cr of 1.27).  Assessment: 62yom continues on heparin for afib.  Heparin level therapeutic, but rending down - Plans for TEE/DCCV on Monday.  No bleeding noted  Eliquis to start once he is cardioverted - 5 mg BID, please re-consult pharmacy when appropriate.  Goal of Therapy:  Heparin level 0.3-0.7 units/ml Monitor platelets by anticoagulation protocol: Yes   Plan:  1) Increase heparin to 2150 units / hr 2) Follow up AM labs  Thank you. Okey Regal, PharmD [phone]  08/05/2013,11:45 AM

## 2013-08-05 NOTE — Progress Notes (Signed)
In preparation for the cardioversion tomorrow, will start oral amiodarone and hold the AM dose of metoprolol.

## 2013-08-05 NOTE — Progress Notes (Signed)
Alert and oriented.  No complaints of SOB.  Ambulates without difficulty in room.  Heparin infusion infusing without difficulty.

## 2013-08-05 NOTE — Progress Notes (Addendum)
SUBJECTIVE:  No complaints  OBJECTIVE:   Vitals:   Filed Vitals:   08/04/13 1300 08/04/13 1336 08/04/13 2031 08/05/13 0503  BP:  115/72 122/82 119/81  Pulse:  83 74 62  Temp: 97.8 F (36.6 C) 98.3 F (36.8 C) 97.8 F (36.6 C) 97.4 F (36.3 C)  TempSrc: Oral Oral Oral Oral  Resp:  19 18 18   Height:      Weight:    125.828 kg (277 lb 6.4 oz)  SpO2:  95% 99% 99%   I&O's:   Intake/Output Summary (Last 24 hours) at 08/05/13 0915 Last data filed at 08/05/13 1610  Gross per 24 hour  Intake   1900 ml  Output   2200 ml  Net   -300 ml   TELEMETRY: Reviewed telemetry pt in atrial fibrillation     PHYSICAL EXAM General: Well developed, well nourished, in no acute distress Head: Eyes PERRLA, No xanthomas.   Normal cephalic and atramatic  Lungs:   Clear bilaterally to auscultation and percussion. Heart:   Irregularly irregular S1 S2 Pulses are 2+ & equal. Abdomen: Bowel sounds are positive, abdomen soft and non-tender without masses Extremities:   No clubbing, cyanosis or edema.  DP +1 Neuro: Alert and oriented X 3. Psych:  Good affect, responds appropriately   LABS: Basic Metabolic Panel:  Recent Labs  96/04/54 1221 08/02/13 1830  08/04/13 0445 08/05/13 0530  NA 140 138  < > 136 136  K 3.8 4.0  < > 3.9 5.2*  CL 100 97  < > 96 100  CO2 30 27  < > 31 28  GLUCOSE 100* 201*  < > 80 89  BUN 21 22  < > 25* 24*  CREATININE 1.26 1.28  < > 1.38* 1.25  CALCIUM 9.2 9.4  < > 8.9 8.8  MG  --  1.9  --   --   --   < > = values in this interval not displayed. Liver Function Tests:  Recent Labs  08/02/13 1830  AST 14  ALT 25  ALKPHOS 46  BILITOT 0.4  PROT 7.7  ALBUMIN 3.9   No results found for this basename: LIPASE, AMYLASE,  in the last 72 hours CBC:  Recent Labs  08/02/13 1221 08/02/13 1830  08/04/13 0445 08/05/13 0530  WBC 15.1* 14.1*  < > 9.0 9.1  NEUTROABS  --  12.3*  --   --   --   HGB 13.5 14.0  < > 14.0 14.2  HCT 38.5* 39.9  < > 41.5 41.6  MCV 90.8  89.9  < > 92.0 91.2  PLT 250 238  < > 224 222  < > = values in this interval not displayed. Cardiac Enzymes:  Recent Labs  08/02/13 1751 08/03/13 0640  TROPONINI <0.30 <0.30   BNP: No components found with this basename: POCBNP,  D-Dimer:  Recent Labs  08/02/13 1221  DDIMER <0.27   Hemoglobin A1C:  Recent Labs  08/02/13 1830  HGBA1C 5.7*   Fasting Lipid Panel:  Recent Labs  08/03/13 0640  CHOL 184  HDL 50  LDLCALC 104*  TRIG 149  CHOLHDL 3.7   Thyroid Function Tests:  Recent Labs  08/02/13 1830  TSH 0.370   Anemia Panel: No results found for this basename: VITAMINB12, FOLATE, FERRITIN, TIBC, IRON, RETICCTPCT,  in the last 72 hours Coag Panel:   Lab Results  Component Value Date   INR 1.10 08/02/2013    RADIOLOGY: Dg Chest 2 View  08/02/2013  CLINICAL DATA:  Shortness of breath, COPD.  EXAM: CHEST  2 VIEW  COMPARISON:  03/24/2010  FINDINGS: There is hyperinflation of the lungs compatible with COPD. Heart is upper limits normal in size. Mild peribronchial thickening. No confluent opacities or effusions. No acute bony abnormality.  IMPRESSION: COPD, mild bronchitis.   Electronically Signed   By: Charlett Nose M.D.   On: 08/02/2013 13:21   ASSESSMENT:  1. Atrial fibrillation, unknown duration, historically perhaps as long as 2-3 months.  2. Acute systolic heart failure - appears resolved - on PO Lasix 3. New left bundle branch block  4. COPD  5. Probable undiagnosed sleep apnea  Plan:  1. Continue Cardizem and Metoprolol  2. Continue IV Heparin gtt - per Dr. Katrinka Blazing will start Eliquis once he is cardioverted  3. TEE [facility] on Monday  4.  Stop potassium since K 5.2 today      Quintella Reichert, MD  08/05/2013  9:15 AM

## 2013-08-06 ENCOUNTER — Encounter (HOSPITAL_COMMUNITY): Payer: Self-pay | Admitting: Anesthesiology

## 2013-08-06 ENCOUNTER — Inpatient Hospital Stay (HOSPITAL_COMMUNITY): Payer: BC Managed Care – PPO | Admitting: Anesthesiology

## 2013-08-06 ENCOUNTER — Encounter (HOSPITAL_COMMUNITY)
Admission: EM | Disposition: A | Discharge status: Home / Self Care | Payer: Self-pay | Source: Home / Self Care | Attending: Interventional Cardiology

## 2013-08-06 DIAGNOSIS — I4891 Unspecified atrial fibrillation: Secondary | ICD-10-CM

## 2013-08-06 HISTORY — PX: TEE WITHOUT CARDIOVERSION: SHX5443

## 2013-08-06 HISTORY — PX: CARDIOVERSION: SHX1299

## 2013-08-06 LAB — BASIC METABOLIC PANEL
CO2: 28 mEq/L (ref 19–32)
Calcium: 9.1 mg/dL (ref 8.4–10.5)
Chloride: 100 mEq/L (ref 96–112)
Glucose, Bld: 84 mg/dL (ref 70–99)
Potassium: 3.8 mEq/L (ref 3.5–5.1)
Sodium: 139 mEq/L (ref 135–145)

## 2013-08-06 LAB — CBC
Hemoglobin: 13.4 g/dL (ref 13.0–17.0)
MCH: 30.5 pg (ref 26.0–34.0)
RBC: 4.4 MIL/uL (ref 4.22–5.81)

## 2013-08-06 SURGERY — ECHOCARDIOGRAM, TRANSESOPHAGEAL
Anesthesia: Monitor Anesthesia Care

## 2013-08-06 MED ORDER — FENTANYL CITRATE 0.05 MG/ML IJ SOLN
INTRAMUSCULAR | Status: DC | PRN
  Administered 2013-08-06 (×3): 25 ug via INTRAVENOUS

## 2013-08-06 MED ORDER — DILTIAZEM HCL ER COATED BEADS 240 MG PO CP24: 240.0000 mg | ORAL_CAPSULE | Freq: Every day | ORAL | Status: DC

## 2013-08-06 MED ORDER — AMIODARONE HCL 200 MG PO TABS: 200.0000 mg | ORAL_TABLET | Freq: Two times a day (BID) | ORAL | Status: DC

## 2013-08-06 MED ORDER — APIXABAN 5 MG PO TABS
5.0000 mg | ORAL_TABLET | Freq: Two times a day (BID) | ORAL | Status: DC
  Administered 2013-08-06: 18:00:00 5 mg via ORAL
  Filled 2013-08-06: qty 1

## 2013-08-06 MED ORDER — LIDOCAINE HCL (CARDIAC) 20 MG/ML IV SOLN
INTRAVENOUS | Status: DC | PRN
  Administered 2013-08-06: 50 mg via INTRAVENOUS

## 2013-08-06 MED ORDER — FENTANYL CITRATE 0.05 MG/ML IJ SOLN
INTRAMUSCULAR | Status: AC
  Filled 2013-08-06: qty 2

## 2013-08-06 MED ORDER — APIXABAN 5 MG PO TABS: 5.0000 mg | ORAL_TABLET | Freq: Two times a day (BID) | ORAL | Status: DC

## 2013-08-06 MED ORDER — METOPROLOL TARTRATE 25 MG PO TABS
25.0000 mg | ORAL_TABLET | Freq: Two times a day (BID) | ORAL | Status: DC
  Filled 2013-08-06: qty 1

## 2013-08-06 MED ORDER — BUTAMBEN-TETRACAINE-BENZOCAINE 2-2-14 % EX AERO
INHALATION_SPRAY | CUTANEOUS | Status: DC | PRN
  Administered 2013-08-06: 2 via TOPICAL

## 2013-08-06 MED ORDER — MIDAZOLAM HCL 5 MG/ML IJ SOLN
INTRAMUSCULAR | Status: AC
  Filled 2013-08-06: qty 2

## 2013-08-06 MED ORDER — VALSARTAN-HYDROCHLOROTHIAZIDE 320-25 MG PO TABS: ORAL_TABLET | ORAL | Status: DC

## 2013-08-06 MED ORDER — MIDAZOLAM HCL 10 MG/2ML IJ SOLN
INTRAMUSCULAR | Status: DC | PRN
  Administered 2013-08-06 (×3): 2 mg via INTRAVENOUS

## 2013-08-06 MED ORDER — METOPROLOL TARTRATE 25 MG PO TABS: 25.0000 mg | ORAL_TABLET | Freq: Two times a day (BID) | ORAL | Status: DC

## 2013-08-06 MED ORDER — PROPOFOL 10 MG/ML IV BOLUS
INTRAVENOUS | Status: DC | PRN
  Administered 2013-08-06: 50 mg via INTRAVENOUS

## 2013-08-06 NOTE — Progress Notes (Signed)
Went over discharge notes with pt and wife. Given exit care notes on stroke, at fib , and eliquis.

## 2013-08-06 NOTE — CV Procedure (Addendum)
See full TEE report in [employer]; no LAA thrombus identified; patient subsequently sedated by anesthesia with diprovan 50 mg IV; synchronized DCCV with 120, 150, and 200 J resulted in continued atrial fibrillation. No immediate complications. Further management per Dr Katrinka Blazing. Patient has been started on amiodarone; may be worthwhile to load fully for 30 days and repeat attempt at DCCV at that time. Olga Millers

## 2013-08-06 NOTE — Progress Notes (Signed)
Patient Name: Nicholas Foley Date of Encounter: 08/06/2013    SUBJECTIVE: The patient had a quite weekend. He still has fatigue with exertion. He denies dyspnea. No orthopnea. There is some difficulty with scheduling the TEE but we have arranged for Dr. Crenshaw to perform at 1 PM today.  TELEMETRY:  A. fib with controlled rate Filed Vitals:   08/05/13 2020 08/05/13 2127 08/06/13 0612 08/06/13 0957  BP: 117/68  112/66   Pulse: 77  69   Temp: 97.4 F (36.3 C)  97.9 F (36.6 C)   TempSrc: Oral  Oral   Resp: 16  20   Height:      Weight:   123.9 kg (273 lb 2.4 oz)   SpO2: 100% 97% 95% 98%    Intake/Output Summary (Last 24 hours) at 08/06/13 1009 Last data filed at 08/06/13 0804  Gross per 24 hour  Intake 987.03 ml  Output   3475 ml  Net -2487.97 ml    LABS: Basic Metabolic Panel:  Recent Labs  08/05/13 0810 08/06/13 0530  NA 137 139  K 4.1 3.8  CL 99 100  CO2 29 28  GLUCOSE 116* 84  BUN 23 22  CREATININE 1.27 1.39*  CALCIUM 9.0 9.1   CBC:  Recent Labs  08/05/13 0530 08/06/13 0530  WBC 9.1 9.2  HGB 14.2 13.4  HCT 41.6 40.1  MCV 91.2 91.1  PLT 222 214   Radiology/Studies: No new studies  Physical Exam: Blood pressure 112/66, pulse 69, temperature 97.9 F (36.6 C), temperature source Oral, resp. rate 20, height 5' 10" (1.778 m), weight 123.9 kg (273 lb 2.4 oz), SpO2 98.00%. Weight change: -1.928 kg (-4 lb 4 oz)   Irregularly irregular rhythm, rate controlled.  Lungs clear  No edema  Neurological exam is normal  ASSESSMENT:  1. Acute systolic heart failure, compensated at home the current medical regimen and with rate control of atrial fibrillation. Etiology is likely tachycardia induced LV dysfunction.  2. Atrial fibrillation with a rate control on multiple medications including diltiazem, beta blocker, amiodarone (started yesterday)  3. Acute kidney injury secondary to diuresis  4. Hypertension controlled  5. Undiagnosed sleep  apnea  Plan:  1. Discontinue diuretics  2. Hold the metoprolol until after cardioversion  3. Convert IV heparin to apixaban after cardioversion  4. Possible discharge later this evening if cardioversion is successful.  Signed, Cyril Railey III,Zena Vitelli W 08/06/2013, 10:09 AM 

## 2013-08-06 NOTE — Interval H&P Note (Signed)
History and Physical Interval Note:  08/06/2013 1:32 PM  Nicholas Foley  has presented today for surgery, with the diagnosis of atrial fibrillation  The various methods of treatment have been discussed with the patient and family. After consideration of risks, benefits and other options for treatment, the patient has consented to  Procedure(s) with comments: TRANSESOPHAGEAL ECHOCARDIOGRAM (TEE) (N/A) CARDIOVERSION (N/A) - spoke with [name]  as a surgical intervention .  The patient's history has been reviewed, patient examined, no change in status, stable for surgery.  I have reviewed the patient's chart and labs.  Questions were answered to the patient's satisfaction.     Olga Millers

## 2013-08-06 NOTE — Discharge Summary (Signed)
Patient ID: Nicholas Foley MRN: [id number] DOB/AGE: [birth year] 62 y.o.  Admit date: 08/02/2013 Discharge date: 08/06/2013 Patient Active Problem List   Diagnosis Date Noted  . Atrial fibrillation with rapid ventricular response 08/02/2013    Priority: High    Class: Acute  . Acute diastolic heart failure 08/02/2013    Priority: High    Class: Acute  . COPD (chronic obstructive pulmonary disease) 08/02/2013    Priority: Medium    Class: Chronic  . Snoring 08/02/2013    Priority: Medium    Class: Chronic  . Left bundle branch block 08/02/2013    Priority: Medium    Class: Acute  . Hypertension, essential 08/02/2013    Priority: Low    Class: Chronic  . Morbid obesity 08/02/2013    Priority: Low    Class: Chronic   Primary Discharge Diagnosis: Acute diastolic heart failure  Secondary Discharge Diagnosis: Atrial fibrillation with RVR, of unknown duration COPD Snoring and probable sleep apnea Hypertension Morbid obesity  Significant Diagnostic Studies: Echocardiogram, 08/03/13 TEE Cardioversion, 08/06/13, Dr. Jens Som   Consults: None  Hospital Course: 62 year old admitted with dyspnea and exertional fatigue. Found to be in acute diastolic heart failure with atrial fibrillation and RVR. The duration of AF is unknown and felt to be weeks to months. He had dramatic improvement with diuresis and rate conrol. Echo demonstrated an EF in the 30-35% range. I felt that TEE guided cardioversion would   allow a more rapid recovery and exertional tolerance. On 08/06/13, Dr. Jens Som performed TEE and no thrombi were noted. He attempted cardioversion x 3 (120, 150, and 200 watt-sec.). He did not convert and it is unclear if he had brief AF or simply never reverted. Plan is to fully load with amiodarone over 1 month and repeat cardioversion as an OP. He will be discharged on diltizem, metoprolol, and Amiodarone for rhythm. Will use Eliquis as the anticoagulant.   Discharge  Exam: Blood pressure 113/79, pulse 69, temperature 97.8 F (36.6 C), temperature source Oral, resp. rate 19, height 5\' 10"  (1.778 m), weight 123.9 kg (273 lb 2.4 oz), SpO2 96.00%.    Obese and in no distress. Clear lungs. Cardiac exam with controlled rate and no gallop. No edema. No skin burn.  Labs:   Lab Results  Component Value Date   WBC 9.2 08/06/2013   HGB 13.4 08/06/2013   HCT 40.1 08/06/2013   MCV 91.1 08/06/2013   PLT 214 08/06/2013    Recent Labs Lab 08/02/13 1830  08/06/13 0530  NA 138  < > 139  K 4.0  < > 3.8  CL 97  < > 100  CO2 27  < > 28  BUN 22  < > 22  CREATININE 1.28  < > 1.39*  CALCIUM 9.4  < > 9.1  PROT 7.7  --   --   BILITOT 0.4  --   --   ALKPHOS 46  --   --   ALT 25  --   --   AST 14  --   --   GLUCOSE 201*  < > 84  < > = values in this interval not displayed. Lab Results  Component Value Date   TROPONINI <0.30 08/03/2013    Lab Results  Component Value Date   CHOL 184 08/03/2013   Lab Results  Component Value Date   HDL 50 08/03/2013   Lab Results  Component Value Date   LDLCALC 104* 08/03/2013  Lab Results  Component Value Date   TRIG 149 08/03/2013   Lab Results  Component Value Date   CHOLHDL 3.7 08/03/2013   No results found for this basename: LDLDIRECT      Radiology: No acute CHF  FOLLOW UP PLANS AND APPOINTMENTS      Future Appointments Provider Department Dept Phone   08/31/2013 9:30 AM Sherrie George, MD [facility] [phone]       Medication List    STOP taking these medications       aspirin EC 81 MG tablet     predniSONE 20 MG tablet  Commonly known as:  DELTASONE      TAKE these medications       acetaminophen 650 MG CR tablet  Commonly known as:  TYLENOL  Take 650 mg by mouth every 8 (eight) hours as needed for pain.     ADVAIR DISKUS 250-50 MCG/DOSE Aepb  Generic drug:  Fluticasone-Salmeterol  Inhale 2 puffs into the lungs 2 (two) times daily.     albuterol 108 (90  BASE) MCG/ACT inhaler  Commonly known as:  PROVENTIL HFA;VENTOLIN HFA  Inhale 2 puffs into the lungs every 6 (six) hours as needed for wheezing or shortness of breath.     amiodarone 200 MG tablet  Commonly known as:  PACERONE  Take 1 tablet (200 mg total) by mouth 2 (two) times daily.     apixaban 5 MG Tabs tablet  Commonly known as:  ELIQUIS  Take 1 tablet (5 mg total) by mouth 2 (two) times daily.     diltiazem 240 MG 24 hr capsule  Commonly known as:  CARDIZEM CD  Take 1 capsule (240 mg total) by mouth daily.     metoprolol tartrate 25 MG tablet  Commonly known as:  LOPRESSOR  Take 1 tablet (25 mg total) by mouth 2 (two) times daily.     NASONEX 50 MCG/ACT nasal spray  Generic drug:  mometasone  Place 2 sprays into the nose daily.     valsartan-hydrochlorothiazide 320-25 MG per tablet  Commonly known as:  DIOVAN-HCT  Do not resume until instructed by Dr. Katrinka Blazing.     VIAGRA 100 MG tablet  Generic drug:  sildenafil  Take 100 mg by mouth daily as needed. For intercourse     VITAMIN D PO  Take 1 tablet by mouth daily.     zolpidem 12.5 MG CR tablet  Commonly known as:  AMBIEN CR  Take 1 tablet by mouth at bedtime as needed. For sleep       Follow-up Information   Follow up with Lesleigh Noe, MD On 08/15/2013. (11:30 A)    Specialty:  Cardiology   Contact information:   [address] [place] [address] [phone]       BRING ALL MEDICATIONS WITH YOU TO FOLLOW UP APPOINTMENTS  Time spent with patient to include physician time: 30 minutes Signed: Lesleigh Noe 08/06/2013, 5:33 PM

## 2013-08-06 NOTE — Progress Notes (Signed)
Ambulated for discharge has all d/c info and papers.Accomapnied by wife. Pt has all personal belongings.

## 2013-08-06 NOTE — Preoperative (Signed)
Beta Blockers   Reason not to administer Beta Blockers:metoprolol held today at 1045

## 2013-08-06 NOTE — Progress Notes (Signed)
Echocardiogram Echocardiogram Transesophageal has been performed.  Nicholas Foley 08/06/2013, 2:28 PM

## 2013-08-06 NOTE — Transfer of Care (Signed)
Immediate Anesthesia Transfer of Care Note  Patient: Nicholas Foley  Procedure(s) Performed: Procedure(s) with comments: TRANSESOPHAGEAL ECHOCARDIOGRAM (TEE) (N/A) CARDIOVERSION (N/A) - spoke with [name]   Patient Location: PACU  Anesthesia Type:General  Level of Consciousness: awake, alert , oriented and patient cooperative  Airway & Oxygen Therapy: Patient Spontanous Breathing and Patient connected to nasal cannula oxygen  Post-op Assessment: Report given to PACU RN, Post -op Vital signs reviewed and stable and Patient moving all extremities  Post vital signs: Reviewed and stable  Complications: No apparent anesthesia complications

## 2013-08-06 NOTE — Progress Notes (Signed)
ANTICOAGULATION CONSULT NOTE - Follow Up Consult  Pharmacy Consult for Heparin Indication: atrial fibrillation  No Known Allergies  Labs:  Recent Labs  08/04/13 0445 08/04/13 0845 08/05/13 0530 08/05/13 0810 08/06/13 0530  HGB 14.0  --  14.2  --  13.4  HCT 41.5  --  41.6  --  40.1  PLT 224  --  222  --  214  HEPARINUNFRC  --  0.46 0.32  --  0.68  CREATININE 1.38*  --  1.25 1.27 1.39*    Estimated Creatinine Clearance: 74.7 ml/min (by C-G formula based on Cr of 1.39).  Assessment: 62yom continues on heparin for afib.  Heparin level therapeutic and no noted bleeding.  His H/H is stable as well as his platelets.  Plans for TEE/DCCV and then place patient on apixaban per Dr. Katrinka Blazing.   His potassium is dropping 5.2>4.1>3.8, remains on furosemide which is likely the reason.  Would prefer level to be > 4.0 prior to cardioversion.  His creatinine has bumped slightly but estimated crcl is stil > 50 ml/min.  Goal of Therapy:  Heparin level 0.3-0.7 units/ml Monitor platelets by anticoagulation protocol: Yes   Plan:  1)  Continue IV heparin at 2150 units/hr 2)  Follow up AM labs if Heparin is continued. 3)  Consider adding K+ to IV fluids or supplementing orally while on furosemide  Nadara Mustard, PharmD., MS Clinical Pharmacist Pager:  [phone] Thank you for allowing pharmacy to be part of this patients care team. 08/06/2013,8:29 AM

## 2013-08-06 NOTE — H&P (View-Only) (Signed)
Patient Name: Nicholas Foley Date of Encounter: 08/06/2013    SUBJECTIVE: The patient had a quite weekend. He still has fatigue with exertion. He denies dyspnea. No orthopnea. There is some difficulty with scheduling the TEE but we have arranged for Dr. Jens Som to perform at 1 PM today.  TELEMETRY:  A. fib with controlled rate Filed Vitals:   08/05/13 2020 08/05/13 2127 08/06/13 0612 08/06/13 0957  BP: 117/68  112/66   Pulse: 77  69   Temp: 97.4 F (36.3 C)  97.9 F (36.6 C)   TempSrc: Oral  Oral   Resp: 16  20   Height:      Weight:   123.9 kg (273 lb 2.4 oz)   SpO2: 100% 97% 95% 98%    Intake/Output Summary (Last 24 hours) at 08/06/13 1009 Last data filed at 08/06/13 0804  Gross per 24 hour  Intake 987.03 ml  Output   3475 ml  Net -2487.97 ml    LABS: Basic Metabolic Panel:  Recent Labs  16/10/96 0810 08/06/13 0530  NA 137 139  K 4.1 3.8  CL 99 100  CO2 29 28  GLUCOSE 116* 84  BUN 23 22  CREATININE 1.27 1.39*  CALCIUM 9.0 9.1   CBC:  Recent Labs  08/05/13 0530 08/06/13 0530  WBC 9.1 9.2  HGB 14.2 13.4  HCT 41.6 40.1  MCV 91.2 91.1  PLT 222 214   Radiology/Studies: No new studies  Physical Exam: Blood pressure 112/66, pulse 69, temperature 97.9 F (36.6 C), temperature source Oral, resp. rate 20, height 5\' 10"  (1.778 m), weight 123.9 kg (273 lb 2.4 oz), SpO2 98.00%. Weight change: -1.928 kg (-4 lb 4 oz)   Irregularly irregular rhythm, rate controlled.  Lungs clear  No edema  Neurological exam is normal  ASSESSMENT:  1. Acute systolic heart failure, compensated at home the current medical regimen and with rate control of atrial fibrillation. Etiology is likely tachycardia induced LV dysfunction.  2. Atrial fibrillation with a rate control on multiple medications including diltiazem, beta blocker, amiodarone (started yesterday)  3. Acute kidney injury secondary to diuresis  4. Hypertension controlled  5. Undiagnosed sleep  apnea  Plan:  1. Discontinue diuretics  2. Hold the metoprolol until after cardioversion  3. Convert IV heparin to apixaban after cardioversion  4. Possible discharge later this evening if cardioversion is successful.  Selinda Eon 08/06/2013, 10:09 AM

## 2013-08-06 NOTE — Progress Notes (Signed)
ANTICOAGULATION CONSULT NOTE - Initial Consult  Pharmacy Consult for Apixaban Indication: atrial fibrillation  No Known Allergies  Patient Measurements: Height: 5\' 10"  (177.8 cm) Weight: 273 lb 2.4 oz (123.9 kg) IBW/kg (Calculated) : 73 Heparin Dosing Weight:    Vital Signs: Temp: 97.8 F (36.6 C) (09/15 1426) Temp src: Oral (09/15 1426) BP: 113/79 mmHg (09/15 1500) Pulse Rate: 69 (09/15 1500)  Labs:  Recent Labs  08/04/13 0445 08/04/13 0845 08/05/13 0530 08/05/13 0810 08/06/13 0530  HGB 14.0  --  14.2  --  13.4  HCT 41.5  --  41.6  --  40.1  PLT 224  --  222  --  214  HEPARINUNFRC  --  0.46 0.32  --  0.68  CREATININE 1.38*  --  1.25 1.27 1.39*    Estimated Creatinine Clearance: 74.7 ml/min (by C-G formula based on Cr of 1.39).   Medical History: Past Medical History  Diagnosis Date  . Hypertension   . Atrial fibrillation with rapid ventricular response 08/02/2013  . Acute diastolic heart failure 08/02/2013    BNP greater than 2000   . Snoring 08/02/2013    Suspect sleep apnea   . Hypertension, essential 08/02/2013  . Morbid obesity 08/02/2013  . Insomnia 08/02/13  . Erectile dysfunction   . COPD (chronic obstructive pulmonary disease)     Moderate by PFTs with response to bronchodilators, May 2014  . Left bundle branch block 08/02/2013    Medications:  Prescriptions prior to admission  Medication Sig Dispense Refill  . acetaminophen (TYLENOL) 650 MG CR tablet Take 650 mg by mouth every 8 (eight) hours as needed for pain.      [name] ADVAIR DISKUS 250-50 MCG/DOSE AEPB Inhale 2 puffs into the lungs 2 (two) times daily.      [name] albuterol (PROVENTIL HFA;VENTOLIN HFA) 108 (90 BASE) MCG/ACT inhaler Inhale 2 puffs into the lungs every 6 (six) hours as needed for wheezing or shortness of breath.      . Cholecalciferol (VITAMIN D PO) Take 1 tablet by mouth daily.      [name] NASONEX 50 MCG/ACT nasal spray Place 2 sprays into the nose daily.      [name] VIAGRA 100 MG tablet Take 100  mg by mouth daily as needed. For intercourse      . zolpidem (AMBIEN CR) 12.5 MG CR tablet Take 1 tablet by mouth at bedtime as needed. For sleep      . [DISCONTINUED] aspirin EC 81 MG tablet Take 81 mg by mouth daily.      . [DISCONTINUED] predniSONE (DELTASONE) 20 MG tablet Take 20-60 mg by mouth See admin instructions. Take 3 tablets daily for 3 days, 2 tablets daily for 2 days, and 1 tablet for 2 days then stop      . [DISCONTINUED] valsartan-hydrochlorothiazide (DIOVAN-HCT) 320-25 MG per tablet Take 1 tablet by mouth daily.        Assessment: Patient's a 62 y.o M presented to the ED with c/o SOB and now in Afib with RVR. No anticoag PTA.  Anticoagulation: Heparin now Eliquis for afib,  ECHO revealed EF 30-35%.  Cardiovascular: HTN. Diastolic HF, VSS. RN reports TEE with UNsuccessful cardioversion 9/15. Meds: Amiodarone, ASA 81mg , diltiazem, Avapro, metoprolol  Endocrinology: glucose stable  Nephrology: Scr=1.39 up a little  Pulmonary: COPD hx on Dulera, Flonase  Hematology / Oncology: cbc WNL  PTA Medication Issues: None  Best Practices: Apixaban   Goal of Therapy:  Therapeutic anticoagulation    Plan:  D/c  IV heparin Apixaban 5mg  BID  Pasty Spillers 08/06/2013,5:08 PM

## 2013-08-06 NOTE — Progress Notes (Signed)
Pt returned from Endo. Had TEE with unsuccessful cardioversion.

## 2013-08-07 ENCOUNTER — Encounter (HOSPITAL_COMMUNITY): Payer: Self-pay | Admitting: Cardiology

## 2013-08-07 NOTE — Anesthesia Postprocedure Evaluation (Signed)
  Anesthesia Post-op Note  Patient: Nicholas Foley  Procedure(s) Performed: Procedure(s) with comments: TRANSESOPHAGEAL ECHOCARDIOGRAM (TEE) (N/A) CARDIOVERSION (N/A) - spoke with [name]   Patient Location: PACU and Short Stay  Anesthesia Type:MAC  Level of Consciousness: awake  Airway and Oxygen Therapy: Patient Spontanous Breathing  Post-op Pain: mild  Post-op Assessment: Post-op Vital signs reviewed  Post-op Vital Signs: Reviewed  Complications: No apparent anesthesia complications

## 2013-08-07 NOTE — Anesthesia Preprocedure Evaluation (Signed)
Anesthesia Evaluation  Patient identified by MRN, date of birth, ID band Patient awake    Reviewed: Allergy & Precautions, H&P , NPO status , Patient's Chart, lab work & pertinent test results  Airway Mallampati: II      Dental   Pulmonary COPD         Cardiovascular hypertension, + dysrhythmias Atrial Fibrillation     Neuro/Psych    GI/Hepatic negative GI ROS, Neg liver ROS,   Endo/Other    Renal/GU negative Renal ROS     Musculoskeletal   Abdominal   Peds  Hematology   Anesthesia Other Findings   Reproductive/Obstetrics                           Anesthesia Physical Anesthesia Plan  ASA: III  Anesthesia Plan: MAC   Post-op Pain Management:    Induction: Intravenous  Airway Management Planned: Mask  Additional Equipment:   Intra-op Plan:   Post-operative Plan:   Informed Consent:   Dental advisory given  Plan Discussed with: CRNA, Anesthesiologist and Surgeon  Anesthesia Plan Comments:         Anesthesia Quick Evaluation

## 2013-08-23 ENCOUNTER — Encounter: Payer: Self-pay | Admitting: *Deleted

## 2013-08-23 ENCOUNTER — Encounter: Payer: Self-pay | Admitting: Interventional Cardiology

## 2013-08-28 ENCOUNTER — Other Ambulatory Visit: Payer: Self-pay

## 2013-08-28 ENCOUNTER — Encounter: Payer: Self-pay | Admitting: Interventional Cardiology

## 2013-08-28 ENCOUNTER — Ambulatory Visit (INDEPENDENT_AMBULATORY_CARE_PROVIDER_SITE_OTHER): Payer: BC Managed Care – PPO | Admitting: Interventional Cardiology

## 2013-08-28 VITALS — BP 110/76 | HR 66 | Ht 71.0 in | Wt 284.0 lb

## 2013-08-28 DIAGNOSIS — I5031 Acute diastolic (congestive) heart failure: Secondary | ICD-10-CM

## 2013-08-28 DIAGNOSIS — I4891 Unspecified atrial fibrillation: Secondary | ICD-10-CM

## 2013-08-28 DIAGNOSIS — I10 Essential (primary) hypertension: Secondary | ICD-10-CM

## 2013-08-28 DIAGNOSIS — Z79899 Other long term (current) drug therapy: Secondary | ICD-10-CM

## 2013-08-28 MED ORDER — POTASSIUM CHLORIDE CRYS ER 20 MEQ PO TBCR: EXTENDED_RELEASE_TABLET | ORAL | Status: DC

## 2013-08-28 MED ORDER — FUROSEMIDE 40 MG PO TABS: 80.0000 mg | ORAL_TABLET | Freq: Two times a day (BID) | ORAL | Status: DC

## 2013-08-28 NOTE — Progress Notes (Signed)
Patient ID: Nicholas Foley, male   DOB: 04/23/1951, 62 y.o.   MRN: [id number]    HPI Edge feels terrible. He has exertional fatigue and dyspnea. He is doing better than he was 2 weeks ago. His I have bleeding on Apixaban.  Cardioversion performed while in the hospital in September was unsuccessful. He has been loading with amiodarone since that time. He is on 400 mg per day . No Known Allergies  Current Outpatient Prescriptions  Medication Sig Dispense Refill  . acetaminophen (TYLENOL) 650 MG CR tablet Take 650 mg by mouth every 8 (eight) hours as needed for pain.      . ADVAIR DISKUS 250-50 MCG/DOSE AEPB Inhale 2 puffs into the lungs 2 (two) times daily.      . albuterol (PROVENTIL HFA;VENTOLIN HFA) 108 (90 BASE) MCG/ACT inhaler Inhale 2 puffs into the lungs every 6 (six) hours as needed for wheezing or shortness of breath.      . amiodarone (PACERONE) 200 MG tablet Take 1 tablet (200 mg total) by mouth 2 (two) times daily.  60 tablet  6  . apixaban (ELIQUIS) 5 MG TABS tablet Take 1 tablet (5 mg total) by mouth 2 (two) times daily.  60 tablet  6  . Cholecalciferol (VITAMIN D PO) Take 1 tablet by mouth daily.      . diltiazem (CARDIZEM CD) 240 MG 24 hr capsule Take 1 capsule (240 mg total) by mouth daily.  30 capsule  11  . furosemide (LASIX) 40 MG tablet Take 40 mg by mouth daily.      . metoprolol tartrate (LOPRESSOR) 25 MG tablet Take 1 tablet (25 mg total) by mouth 2 (two) times daily.  60 tablet  6  . NASONEX 50 MCG/ACT nasal spray Place 2 sprays into the nose daily.      . valsartan-hydrochlorothiazide (DIOVAN-HCT) 320-25 MG per tablet Do not resume until instructed by Dr. Arley Garant.      . VIAGRA 100 MG tablet Take 100 mg by mouth daily as needed. For intercourse      . zolpidem (AMBIEN CR) 12.5 MG CR tablet Take 1 tablet by mouth at bedtime as needed. For sleep       No current facility-administered medications for this visit.    Past Medical History  Diagnosis Date  . Hypertension   .  Atrial fibrillation with rapid ventricular response 08/02/2013  . Acute diastolic heart failure 08/02/2013    BNP greater than 2000   . Snoring 08/02/2013    Suspect sleep apnea   . Hypertension, essential 08/02/2013  . Morbid obesity 08/02/2013  . Insomnia 08/02/13  . Erectile dysfunction   . COPD (chronic obstructive pulmonary disease)     Moderate by PFTs with response to bronchodilators, May 2014  . Left bundle branch block 08/02/2013    Past Surgical History  Procedure Laterality Date  . Nasal septum surgery    . Vasectomy    . Tee without cardioversion N/A 08/06/2013    Procedure: TRANSESOPHAGEAL ECHOCARDIOGRAM (TEE);  Surgeon: Brian S Crenshaw, MD;  Location: [facility] ENDOSCOPY;  Service: Cardiovascular;  Laterality: N/A;  . Cardioversion N/A 08/06/2013    Procedure: CARDIOVERSION;  Surgeon: Brian S Crenshaw, MD;  Location: [facility] ENDOSCOPY;  Service: Cardiovascular;  Laterality: N/A;  spoke with [name]     ROS: Fatigue and lethargy. No chest discomfort. PHYSICAL EXAM BP 110/76  Pulse 66  Ht 5' 11" (1.803 m)  Wt 284 lb (128.822 kg)  BMI 39.63 kg/m2 The lung fields   Irregularly irregular rhythm No JVD No peripheral edema  EKG: Atrial fibrillation, with controlled rate, and left axis deviation  ASSESSMENT AND PLAN  1. Atrial fibrillation with controlled rate. Plan to perform elective cardioversion in one week.  2. Acute diastolic heart failure precipitated by atrial fibrillation. We will increase the diuretic to Lasix 80 mg twice daily regimen and potassium to 60 mEq per day. Patient will have a potassium performed this coming Friday  3. Anticoagulation therapy 

## 2013-08-28 NOTE — Patient Instructions (Addendum)
INCREASE POTASIUM TO IN THE AM AND IN THE PM  INCREASE LASIX TO 80MG  TWICE DAILY  Your physician has recommended that you have a Cardioversion (DCCV). Electrical Cardioversion uses a jolt of electricity to your heart either through paddles or wired patches attached to your chest. This is a controlled, usually prescheduled, procedure. Defibrillation is done under light anesthesia in the hospital, and you usually go home the day of the procedure. This is done to get your heart back into a normal rhythm. You are not awake for the procedure. Please see the instruction sheet given to you today.  YOUR CARDIOVERSION IS SCHEDULED FOR 09/03/13 @12 :30PM  Your physician recommends that you return for lab work in: 3 DAYS 08/31/13 FOR A BMET

## 2013-08-29 ENCOUNTER — Telehealth: Payer: Self-pay

## 2013-08-29 NOTE — Telephone Encounter (Signed)
New Problem  Pt returning call// instructions??

## 2013-08-29 NOTE — Telephone Encounter (Signed)
Spoke with pt. Pt given instruction to hold metoprolol that day of cardioversion 09/03/13

## 2013-08-29 NOTE — Telephone Encounter (Signed)
lmom pt to hold metoprolol the day for his cardioversion 09/03/13

## 2013-08-31 ENCOUNTER — Ambulatory Visit (INDEPENDENT_AMBULATORY_CARE_PROVIDER_SITE_OTHER): Payer: BC Managed Care – PPO | Admitting: Ophthalmology

## 2013-08-31 ENCOUNTER — Other Ambulatory Visit (INDEPENDENT_AMBULATORY_CARE_PROVIDER_SITE_OTHER): Payer: BC Managed Care – PPO

## 2013-08-31 DIAGNOSIS — I4891 Unspecified atrial fibrillation: Secondary | ICD-10-CM

## 2013-08-31 DIAGNOSIS — I10 Essential (primary) hypertension: Secondary | ICD-10-CM

## 2013-08-31 DIAGNOSIS — I5031 Acute diastolic (congestive) heart failure: Secondary | ICD-10-CM

## 2013-08-31 LAB — BASIC METABOLIC PANEL
CO2: 32 mEq/L (ref 19–32)
Calcium: 9.4 mg/dL (ref 8.4–10.5)
GFR: 49.19 mL/min — ABNORMAL LOW (ref >60.00)
Sodium: 141 mEq/L (ref 135–145)

## 2013-09-03 ENCOUNTER — Encounter (HOSPITAL_COMMUNITY): Payer: BC Managed Care – PPO | Admitting: Certified Registered Nurse Anesthetist

## 2013-09-03 ENCOUNTER — Encounter (HOSPITAL_COMMUNITY): Payer: Self-pay | Admitting: Anesthesiology

## 2013-09-03 ENCOUNTER — Ambulatory Visit (HOSPITAL_COMMUNITY)
Admission: RE | Admit: 2013-09-03 | Discharge: 2013-09-03 | Disposition: A | Discharge status: Home / Self Care | Payer: BC Managed Care – PPO | Source: Ambulatory Visit | Attending: Interventional Cardiology | Admitting: Interventional Cardiology

## 2013-09-03 ENCOUNTER — Encounter (HOSPITAL_COMMUNITY)
Admission: RE | Disposition: A | Discharge status: Home / Self Care | Payer: Self-pay | Source: Ambulatory Visit | Attending: Interventional Cardiology

## 2013-09-03 ENCOUNTER — Ambulatory Visit (HOSPITAL_COMMUNITY): Payer: BC Managed Care – PPO | Admitting: Certified Registered Nurse Anesthetist

## 2013-09-03 DIAGNOSIS — I4891 Unspecified atrial fibrillation: Secondary | ICD-10-CM

## 2013-09-03 DIAGNOSIS — J449 Chronic obstructive pulmonary disease, unspecified: Secondary | ICD-10-CM | POA: Insufficient documentation

## 2013-09-03 DIAGNOSIS — I10 Essential (primary) hypertension: Secondary | ICD-10-CM | POA: Insufficient documentation

## 2013-09-03 DIAGNOSIS — Z79899 Other long term (current) drug therapy: Secondary | ICD-10-CM | POA: Insufficient documentation

## 2013-09-03 DIAGNOSIS — J4489 Other specified chronic obstructive pulmonary disease: Secondary | ICD-10-CM | POA: Insufficient documentation

## 2013-09-03 DIAGNOSIS — R0989 Other specified symptoms and signs involving the circulatory and respiratory systems: Secondary | ICD-10-CM | POA: Insufficient documentation

## 2013-09-03 DIAGNOSIS — Z7901 Long term (current) use of anticoagulants: Secondary | ICD-10-CM | POA: Insufficient documentation

## 2013-09-03 DIAGNOSIS — N529 Male erectile dysfunction, unspecified: Secondary | ICD-10-CM | POA: Insufficient documentation

## 2013-09-03 DIAGNOSIS — R0609 Other forms of dyspnea: Secondary | ICD-10-CM | POA: Insufficient documentation

## 2013-09-03 DIAGNOSIS — I5031 Acute diastolic (congestive) heart failure: Secondary | ICD-10-CM | POA: Insufficient documentation

## 2013-09-03 HISTORY — PX: CARDIOVERSION: SHX1299

## 2013-09-03 LAB — BASIC METABOLIC PANEL
BUN: 20 mg/dL (ref 6–23)
CO2: 28 mEq/L (ref 19–32)
Calcium: 9 mg/dL (ref 8.4–10.5)
Creatinine, Ser: 1.36 mg/dL — ABNORMAL HIGH (ref 0.50–1.35)
GFR calc Af Amer: 64 mL/min — ABNORMAL LOW (ref >90)
GFR calc non Af Amer: 55 mL/min — ABNORMAL LOW (ref >90)
Potassium: 3.4 mEq/L — ABNORMAL LOW (ref 3.5–5.1)

## 2013-09-03 SURGERY — CARDIOVERSION
Anesthesia: General | Wound class: Clean

## 2013-09-03 MED ORDER — POTASSIUM CHLORIDE CRYS ER 20 MEQ PO TBCR
40.0000 meq | EXTENDED_RELEASE_TABLET | Freq: Once | ORAL | Status: AC
  Administered 2013-09-03: 40 meq via ORAL
  Filled 2013-09-03: qty 2

## 2013-09-03 MED ORDER — SODIUM CHLORIDE 0.9 % IV SOLN
INTRAVENOUS | Status: DC | PRN
  Administered 2013-09-03: 14:00:00 via INTRAVENOUS

## 2013-09-03 MED ORDER — SODIUM CHLORIDE 0.9 % IJ SOLN: 3.0000 mL | Freq: Two times a day (BID) | INTRAMUSCULAR | Status: DC

## 2013-09-03 MED ORDER — SODIUM CHLORIDE 0.9 % IV SOLN
250.0000 mL | INTRAVENOUS | Status: DC
  Administered 2013-09-03: 500 mL via INTRAVENOUS

## 2013-09-03 MED ORDER — PROPOFOL 10 MG/ML IV BOLUS
INTRAVENOUS | Status: DC | PRN
  Administered 2013-09-03: 140 mg via INTRAVENOUS

## 2013-09-03 MED ORDER — SODIUM CHLORIDE 0.9 % IJ SOLN: 3.0000 mL | INTRAMUSCULAR | Status: DC | PRN

## 2013-09-03 NOTE — Anesthesia Preprocedure Evaluation (Signed)
Anesthesia Evaluation  Patient identified by MRN, date of birth, ID band Patient awake    Reviewed: Allergy & Precautions, H&P , NPO status , Patient's Chart, lab work & pertinent test results, reviewed documented beta blocker date and time   Airway Mallampati: II TM Distance: >3 FB Neck ROM: full    Dental   Pulmonary COPD COPD inhaler,  breath sounds clear to auscultation        Cardiovascular hypertension, On Medications and On Home Beta Blockers +CHF negative cardio ROS  + dysrhythmias Atrial Fibrillation Rhythm:regular     Neuro/Psych negative neurological ROS  negative psych ROS   GI/Hepatic negative GI ROS, Neg liver ROS,   Endo/Other  Morbid obesity  Renal/GU negative Renal ROS  negative genitourinary   Musculoskeletal   Abdominal   Peds  Hematology negative hematology ROS (+)   Anesthesia Other Findings See surgeon's H&P   Reproductive/Obstetrics negative OB ROS                           Anesthesia Physical Anesthesia Plan  ASA: III  Anesthesia Plan: General   Post-op Pain Management:    Induction: Intravenous  Airway Management Planned: Mask  Additional Equipment:   Intra-op Plan:   Post-operative Plan:   Informed Consent: I have reviewed the patients History and Physical, chart, labs and discussed the procedure including the risks, benefits and alternatives for the proposed anesthesia with the patient or authorized representative who has indicated his/her understanding and acceptance.   Dental Advisory Given  Plan Discussed with: CRNA and Surgeon  Anesthesia Plan Comments:         Anesthesia Quick Evaluation

## 2013-09-03 NOTE — Interval H&P Note (Signed)
History and Physical Interval Note:  09/03/2013 1:03 PM  Campbell Soup  has presented today for surgery, with the diagnosis of A FIB   The various methods of treatment have been discussed with the patient and family. After consideration of risks, benefits and other options for treatment, the patient has consented to  Procedure(s): CARDIOVERSION (N/A) as a surgical intervention .  The patient's history has been reviewed, patient examined, no change in status, stable for surgery.  I have reviewed the patient's chart and labs.  Questions were answered to the patient's satisfaction.     Lesleigh Noe

## 2013-09-03 NOTE — Anesthesia Procedure Notes (Signed)
Procedure Name: MAC Date/Time: 09/03/2013 1:39 PM Performed by: Lesleigh Noe Pre-anesthesia Checklist: Patient identified, Patient being monitored, Emergency Drugs available, Timeout performed and Suction available Patient Re-evaluated:Patient Re-evaluated prior to inductionOxygen Delivery Method: Ambu bag Preoxygenation: Pre-oxygenation with 100% oxygen Intubation Type: IV induction Ventilation: Mask ventilation without difficulty

## 2013-09-03 NOTE — CV Procedure (Signed)
Electrical Cardioversion Procedure Note ZAKIR HENNER [id number] 05/01/1951  Procedure: Electrical Cardioversion Indications:  Atrial Fibrillation  Time Out: Verified patient identification, verified procedure,medications/allergies/relevent history reviewed, required imaging and test results available.  Performed  Procedure Details  The patient was NPO after midnight. Anesthesia was administered at the beside  by Dr.Kasik with 145 mg of propofol.  Cardioversion was done with synchronized biphasic defibrillation with AP pads with 200 watts.  The patient converted to normal sinus rhythm. The patient tolerated the procedure well   IMPRESSION:  Successful cardioversion of atrial fibrillation to normal sinus rhythm.     Lesleigh Noe 09/03/2013, 2:03 PM

## 2013-09-03 NOTE — H&P (View-Only) (Signed)
Patient ID: Nicholas Foley, male   DOB: [birth year], 62 y.o.   MRN: [id number]    HPI Walfred feels terrible. He has exertional fatigue and dyspnea. He is doing better than he was 2 weeks ago. His I have bleeding on Apixaban.  Cardioversion performed while in the hospital in September was unsuccessful. He has been loading with amiodarone since that time. He is on 400 mg per day . No Known Allergies  Current Outpatient Prescriptions  Medication Sig Dispense Refill  . acetaminophen (TYLENOL) 650 MG CR tablet Take 650 mg by mouth every 8 (eight) hours as needed for pain.      [name] ADVAIR DISKUS 250-50 MCG/DOSE AEPB Inhale 2 puffs into the lungs 2 (two) times daily.      [name] albuterol (PROVENTIL HFA;VENTOLIN HFA) 108 (90 BASE) MCG/ACT inhaler Inhale 2 puffs into the lungs every 6 (six) hours as needed for wheezing or shortness of breath.      [name] amiodarone (PACERONE) 200 MG tablet Take 1 tablet (200 mg total) by mouth 2 (two) times daily.  60 tablet  6  . apixaban (ELIQUIS) 5 MG TABS tablet Take 1 tablet (5 mg total) by mouth 2 (two) times daily.  60 tablet  6  . Cholecalciferol (VITAMIN D PO) Take 1 tablet by mouth daily.      [name] diltiazem (CARDIZEM CD) 240 MG 24 hr capsule Take 1 capsule (240 mg total) by mouth daily.  30 capsule  11  . furosemide (LASIX) 40 MG tablet Take 40 mg by mouth daily.      . metoprolol tartrate (LOPRESSOR) 25 MG tablet Take 1 tablet (25 mg total) by mouth 2 (two) times daily.  60 tablet  6  . NASONEX 50 MCG/ACT nasal spray Place 2 sprays into the nose daily.      . valsartan-hydrochlorothiazide (DIOVAN-HCT) 320-25 MG per tablet Do not resume until instructed by Dr. Katrinka Blazing.      [name] VIAGRA 100 MG tablet Take 100 mg by mouth daily as needed. For intercourse      . zolpidem (AMBIEN CR) 12.5 MG CR tablet Take 1 tablet by mouth at bedtime as needed. For sleep       No current facility-administered medications for this visit.    Past Medical History  Diagnosis Date  . Hypertension   .  Atrial fibrillation with rapid ventricular response 08/02/2013  . Acute diastolic heart failure 08/02/2013    BNP greater than 2000   . Snoring 08/02/2013    Suspect sleep apnea   . Hypertension, essential 08/02/2013  . Morbid obesity 08/02/2013  . Insomnia 08/02/13  . Erectile dysfunction   . COPD (chronic obstructive pulmonary disease)     Moderate by PFTs with response to bronchodilators, May 2014  . Left bundle branch block 08/02/2013    Past Surgical History  Procedure Laterality Date  . Nasal septum surgery    . Vasectomy    . Tee without cardioversion N/A 08/06/2013    Procedure: TRANSESOPHAGEAL ECHOCARDIOGRAM (TEE);  Surgeon: Lewayne Bunting, MD;  Location: [facility] ENDOSCOPY;  Service: Cardiovascular;  Laterality: N/A;  . Cardioversion N/A 08/06/2013    Procedure: CARDIOVERSION;  Surgeon: Lewayne Bunting, MD;  Location: [facility] ENDOSCOPY;  Service: Cardiovascular;  Laterality: N/A;  spoke with [name]     ROS: Fatigue and lethargy. No chest discomfort. PHYSICAL EXAM BP 110/76  Pulse 66  Ht 5\' 11"  (1.803 m)  Wt 284 lb (128.822 kg)  BMI 39.63 kg/m2 The lung fields  Irregularly irregular rhythm No JVD No peripheral edema  EKG: Atrial fibrillation, with controlled rate, and left axis deviation  ASSESSMENT AND PLAN  1. Atrial fibrillation with controlled rate. Plan to perform elective cardioversion in one week.  2. Acute diastolic heart failure precipitated by atrial fibrillation. We will increase the diuretic to Lasix 80 mg twice daily regimen and potassium to 60 mEq per day. Patient will have a potassium performed this coming Friday  3. Anticoagulation therapy

## 2013-09-03 NOTE — Preoperative (Signed)
Beta Blockers   Reason not to administer Beta Blockers:Not Applicable 

## 2013-09-03 NOTE — Anesthesia Postprocedure Evaluation (Signed)
Anesthesia Post Note  Patient: Nicholas Foley  Procedure(s) Performed: Procedure(s) (LRB): CARDIOVERSION (N/A)  Anesthesia type: General  Patient location: PACU  Post pain: Pain level controlled  Post assessment: Patient's Cardiovascular Status Stable  Last Vitals:  Filed Vitals:   09/03/13 1315  BP: 121/77  Temp: 36.7 C  Resp: 16    Post vital signs: Reviewed and stable  Level of consciousness: alert  Complications: No apparent anesthesia complications

## 2013-09-03 NOTE — Transfer of Care (Signed)
Immediate Anesthesia Transfer of Care Note  Patient: Nicholas Foley  Procedure(s) Performed: Procedure(s): CARDIOVERSION (N/A)  Patient Location: PACU  Anesthesia Type:General  Level of Consciousness: awake, alert , oriented and patient cooperative  Airway & Oxygen Therapy: Patient Spontanous Breathing and Patient connected to nasal cannula oxygen  Post-op Assessment: Report given to PACU RN, Post -op Vital signs reviewed and stable and Patient moving all extremities  Post vital signs: Reviewed and stable  Complications: No apparent anesthesia complications

## 2013-09-05 ENCOUNTER — Encounter (HOSPITAL_COMMUNITY): Payer: Self-pay | Admitting: Interventional Cardiology

## 2013-09-05 ENCOUNTER — Ambulatory Visit (INDEPENDENT_AMBULATORY_CARE_PROVIDER_SITE_OTHER): Payer: BC Managed Care – PPO | Admitting: Interventional Cardiology

## 2013-09-05 VITALS — BP 136/86 | HR 68 | Ht 71.0 in | Wt 286.0 lb

## 2013-09-05 DIAGNOSIS — R0609 Other forms of dyspnea: Secondary | ICD-10-CM

## 2013-09-05 DIAGNOSIS — I4891 Unspecified atrial fibrillation: Secondary | ICD-10-CM

## 2013-09-05 DIAGNOSIS — I10 Essential (primary) hypertension: Secondary | ICD-10-CM

## 2013-09-05 DIAGNOSIS — R0683 Snoring: Secondary | ICD-10-CM

## 2013-09-05 DIAGNOSIS — I447 Left bundle-branch block, unspecified: Secondary | ICD-10-CM

## 2013-09-05 DIAGNOSIS — I5031 Acute diastolic (congestive) heart failure: Secondary | ICD-10-CM

## 2013-09-05 MED ORDER — FUROSEMIDE 40 MG PO TABS: 80.0000 mg | ORAL_TABLET | Freq: Every day | ORAL | Status: DC

## 2013-09-05 MED ORDER — METOPROLOL TARTRATE 25 MG PO TABS: 25.0000 mg | ORAL_TABLET | Freq: Every day | ORAL | Status: DC

## 2013-09-05 MED ORDER — POTASSIUM CHLORIDE CRYS ER 20 MEQ PO TBCR: 20.0000 meq | EXTENDED_RELEASE_TABLET | Freq: Every day | ORAL | Status: DC

## 2013-09-05 MED ORDER — AMIODARONE HCL 200 MG PO TABS
200.0000 mg | ORAL_TABLET | Freq: Every day | ORAL | Status: DC
Start: 2013-09-05 — End: 2014-03-22

## 2013-09-05 NOTE — Progress Notes (Signed)
Patient ID: Nicholas Foley, male   DOB: 07/01/1951, 62 y.o.   MRN: [id number]    HPI He feels better. He still feels somewhat fatigued. Dyspnea has significantly improved. He underwent successful electrical cardioversion on 09/03/2013. No postconversion side effects./Complications appear No Known Allergies  Current Outpatient Prescriptions  Medication Sig Dispense Refill  . acetaminophen (TYLENOL) 650 MG CR tablet Take 650 mg by mouth every 8 (eight) hours as needed for pain.      [name] ADVAIR DISKUS 250-50 MCG/DOSE AEPB Inhale 2 puffs into the lungs 2 (two) times daily.      [name] albuterol (PROVENTIL HFA;VENTOLIN HFA) 108 (90 BASE) MCG/ACT inhaler Inhale 2 puffs into the lungs every 6 (six) hours as needed for wheezing or shortness of breath.      [name] amiodarone (PACERONE) 200 MG tablet Take 200 mg by mouth daily.      [name] amiodarone (PACERONE) 200 MG tablet Take 1 tablet (200 mg total) by mouth daily.      [name] apixaban (ELIQUIS) 5 MG TABS tablet Take 1 tablet (5 mg total) by mouth 2 (two) times daily.  60 tablet  6  . Cholecalciferol (VITAMIN D PO) Take 1 tablet by mouth daily.      [name] diltiazem (CARDIZEM CD) 240 MG 24 hr capsule Take 1 capsule (240 mg total) by mouth daily.  30 capsule  11  . furosemide (LASIX) 40 MG tablet Take 2 tablets (80 mg total) by mouth daily.      . metoprolol tartrate (LOPRESSOR) 25 MG tablet Take 1 tablet (25 mg total) by mouth daily.      [name] NASONEX 50 MCG/ACT nasal spray Place 2 sprays into the nose daily.      . potassium chloride SA (K-DUR,KLOR-CON) 20 MEQ tablet Take 1 tablet (20 mEq total) by mouth daily.      . valsartan-hydrochlorothiazide (DIOVAN-HCT) 320-25 MG per tablet Do not resume until instructed by Dr. Katrinka Blazing.      [name] VIAGRA 100 MG tablet Take 100 mg by mouth daily as needed. For intercourse      . zolpidem (AMBIEN CR) 12.5 MG CR tablet Take 1 tablet by mouth at bedtime as needed. For sleep       No current facility-administered medications for this visit.     Past Medical History  Diagnosis Date  . Hypertension   . Atrial fibrillation with rapid ventricular response 08/02/2013  . Acute diastolic heart failure 08/02/2013    BNP greater than 2000   . Snoring 08/02/2013    Suspect sleep apnea   . Hypertension, essential 08/02/2013  . Morbid obesity 08/02/2013  . Insomnia 08/02/13  . Erectile dysfunction   . COPD (chronic obstructive pulmonary disease)     Moderate by PFTs with response to bronchodilators, May 2014  . Left bundle branch block 08/02/2013    Past Surgical History  Procedure Laterality Date  . Nasal septum surgery    . Vasectomy    . Tee without cardioversion N/A 08/06/2013    Procedure: TRANSESOPHAGEAL ECHOCARDIOGRAM (TEE);  Surgeon: Lewayne Bunting, MD;  Location: [facility] ENDOSCOPY;  Service: Cardiovascular;  Laterality: N/A;  . Cardioversion N/A 08/06/2013    Procedure: CARDIOVERSION;  Surgeon: Lewayne Bunting, MD;  Location: [facility] ENDOSCOPY;  Service: Cardiovascular;  Laterality: N/A;  spoke with [name]   . Cardioversion N/A 09/03/2013    Procedure: CARDIOVERSION;  Surgeon: Lesleigh Noe, MD;  Location: [facility] ENDOSCOPY;  Service: Cardiovascular;  Laterality: N/A;    ROS:  Sore throat and cough. PHYSICAL EXAM BP 136/86  Pulse 68  Ht 5\' 11"  (1.803 m)  Wt 286 lb (129.729 kg)  BMI 39.91 kg/m2 Chest is clear S4 gallop on cardiac exam. No murmur. No edema. Neurological exam is intact.    EAV:[id number] sinus rhythm with first degree AV block.   ASSESSMENT AND PLAN  1. Atrial fibrillation, persistent. The patient is now in normal sinus rhythm, status post electrical cardioversion after loading with amiodarone therapy. There's been a dramatic improvement. In exertional tolerance and reduction in dyspnea.  2. Amiodarone therapy.  3. Snoring and probable sleep apnea.  4. Hypertension.  5. Acute on chronic diastolic heart failure, markedly improved with electrical conversion.  Overall, the patient is clinically improved. He  is maintaining normal sinus rhythm. I will decrease amiodarone to 200 mg per day, decrease furosemide to 80 mg per day, decrease potassium to 20 mEq per day, decrease metoprolol to 25 mg once per day for a week and then discontinue. He will return to see me in 6 weeks. He will call if increased symptoms. We will schedule him for sleep study to rule out sleep apnea.

## 2013-09-05 NOTE — Addendum Note (Signed)
Addended by: Jarvis Newcomer on: 09/05/2013 11:28 AM   Modules accepted: Orders

## 2013-09-05 NOTE — Patient Instructions (Addendum)
DECREASE AMIODARONE 200MG  1 TABLET DAILY  DECREASE METOPROLOL TO 25MG  DAILY TAKE FOR 1 WEEK THAN STOP  DECREASE LASIX TO 80MG  DAILY  DECREASE POTASSIUM TO DAILY  FOLLOW UP IN 4-6 WEEKS WITH DR.SMITH

## 2013-09-21 ENCOUNTER — Telehealth: Payer: Self-pay | Admitting: General Surgery

## 2013-09-21 NOTE — Telephone Encounter (Signed)
Patient will call to set up CPAP titration

## 2013-09-21 NOTE — Telephone Encounter (Signed)
Made pt aware [place] [facility] will be calling him next week to set up for Titration

## 2013-09-21 NOTE — Telephone Encounter (Signed)
Message copied by Nita Sells on Fri Sep 21, 2013  1:42 PM ------      Message from: Armanda Magic R      Created: Thu Sep 20, 2013  5:17 PM       Please let patient know that he has severe OSA with an AHI of 74/hr with significant hypoxia and needs CPAP titration ASAP ------

## 2013-09-21 NOTE — Telephone Encounter (Signed)
Pt declined sleep study until talking to Dr. Katrinka Blazing in Office in December. I explained his results and that is was a very concerning study and that we wanted him scheduled ASAP for his tritration. He stated he understood that but did not want to proceed until talking to Dr Katrinka Blazing

## 2013-09-24 ENCOUNTER — Ambulatory Visit (INDEPENDENT_AMBULATORY_CARE_PROVIDER_SITE_OTHER): Payer: BC Managed Care – PPO | Admitting: Interventional Cardiology

## 2013-09-24 ENCOUNTER — Encounter: Payer: Self-pay | Admitting: Interventional Cardiology

## 2013-09-24 ENCOUNTER — Telehealth: Payer: Self-pay

## 2013-09-24 ENCOUNTER — Telehealth: Payer: Self-pay | Admitting: Interventional Cardiology

## 2013-09-24 VITALS — BP 148/83 | HR 85 | Ht 71.0 in | Wt 286.0 lb

## 2013-09-24 DIAGNOSIS — I4891 Unspecified atrial fibrillation: Secondary | ICD-10-CM

## 2013-09-24 DIAGNOSIS — I5031 Acute diastolic (congestive) heart failure: Secondary | ICD-10-CM

## 2013-09-24 MED ORDER — FUROSEMIDE 40 MG PO TABS: 80.0000 mg | ORAL_TABLET | Freq: Two times a day (BID) | ORAL | Status: DC

## 2013-09-24 MED ORDER — POTASSIUM CHLORIDE CRYS ER 20 MEQ PO TBCR: 20.0000 meq | EXTENDED_RELEASE_TABLET | Freq: Two times a day (BID) | ORAL | Status: DC

## 2013-09-24 NOTE — Telephone Encounter (Signed)
Called patient back. Has not felt well this weekend after gaining 15 pounds. Not sure if he is out of rhythm. Complaining of SOB. Discussed with Dr.Smith. OK to add on to his schedule today at 10AM. Patient aware. Will be here at 10Am.

## 2013-09-24 NOTE — Telephone Encounter (Signed)
contacted [facility] to make sure they still had ref on file for pt. pt had declined appt and would like to reschedule after discusiing importance with Dr.Smith. left a detailed message for them to f/u with pt and the tel phone # to this office if I need to assist further

## 2013-09-24 NOTE — Telephone Encounter (Signed)
New problem    Pt was told by Dr Katrinka Blazing to call in when he has gained a lot of water weight.   Pt feels very bad and needs a call back today.

## 2013-09-24 NOTE — Patient Instructions (Signed)
Increase Lasix to 80mg  twice daily  Increase Potassium to 20 meq twice daily  You may return to your original dosage once you reach your baseline weight  Please keep your upcoming appt 10/12/13 @10 :30  We will follow up with Dr.Turner about having your sleep apnea titration

## 2013-09-24 NOTE — Progress Notes (Signed)
Patient ID: [facility], male   DOB: [birth year], 62 y.o.   MRN: [id number]    [address] [place], [place]  [address] Phone: [phone] Fax:  [phone]  Date:  09/24/2013   ID:  [facility], DOB [birth year], MRN [id number]  PCP:  Frederich Chick, MD   ASSESSMENT:  1. Dyspnea and sensation of bloating 2. Acute on chronic diastolic heart failure accounting for symptom noted above 3. History of paroxysmal atrial fibrillation, in normal sinus rhythm today 4. Hypertension 5. Untreated severe obstructive sleep apnea     PLAN:  1. Increase furosemide to 80 mg twice a day until symptoms resolve then back to 80 mg daily 2. Increase K-Dur to 20 mEq twice a day   SUBJECTIVE: Nicholas Foley is a 62 y.o. male who went to a party this weekend. He had multiple beers. Since Saturday evening he has felt bloated and dyspneic. There is some PND. No lower extremity swelling. He is concerned that he is back in atrial fibrillation because these other symptoms he had when his heart was out of rhythm. He denies chest pain.  Severe untreated sleep apnea.   Wt Readings from Last 3 Encounters:  09/24/13 286 lb (129.729 kg)  09/05/13 286 lb (129.729 kg)  08/28/13 284 lb (128.822 kg)     Past Medical History  Diagnosis Date  . Hypertension   . Atrial fibrillation with rapid ventricular response 08/02/2013  . Acute diastolic heart failure 08/02/2013    BNP greater than 2000   . Snoring 08/02/2013    Suspect sleep apnea   . Hypertension, essential 08/02/2013  . Morbid obesity 08/02/2013  . Insomnia 08/02/13  . Erectile dysfunction   . COPD (chronic obstructive pulmonary disease)     Moderate by PFTs with response to bronchodilators, May 2014  . Left bundle branch block 08/02/2013    Current Outpatient Prescriptions  Medication Sig Dispense Refill  . acetaminophen (TYLENOL) 650 MG CR tablet Take 650 mg by mouth every 8 (eight) hours as needed for pain.      [name] ADVAIR DISKUS  250-50 MCG/DOSE AEPB Inhale 2 puffs into the lungs 2 (two) times daily.      [name] albuterol (PROVENTIL HFA;VENTOLIN HFA) 108 (90 BASE) MCG/ACT inhaler Inhale 2 puffs into the lungs every 6 (six) hours as needed for wheezing or shortness of breath.      [name] amiodarone (PACERONE) 200 MG tablet Take 200 mg by mouth daily.      [name] amiodarone (PACERONE) 200 MG tablet Take 1 tablet (200 mg total) by mouth daily.      [name] apixaban (ELIQUIS) 5 MG TABS tablet Take 1 tablet (5 mg total) by mouth 2 (two) times daily.  60 tablet  6  . Cholecalciferol (VITAMIN D PO) Take 1 tablet by mouth daily.      [name] diltiazem (CARDIZEM CD) 240 MG 24 hr capsule Take 1 capsule (240 mg total) by mouth daily.  30 capsule  11  . furosemide (LASIX) 40 MG tablet Take 2 tablets (80 mg total) by mouth daily.      . metoprolol tartrate (LOPRESSOR) 25 MG tablet Take 1 tablet (25 mg total) by mouth daily.      [name] NASONEX 50 MCG/ACT nasal spray Place 2 sprays into the nose daily.      . potassium chloride SA (K-DUR,KLOR-CON) 20 MEQ tablet Take 1 tablet (20 mEq total) by mouth daily.      [name]  valsartan-hydrochlorothiazide (DIOVAN-HCT) 320-25 MG per tablet Do not resume until instructed by Dr. Katrinka Blazing.      [name] VIAGRA 100 MG tablet Take 100 mg by mouth daily as needed. For intercourse      . zolpidem (AMBIEN CR) 12.5 MG CR tablet Take 1 tablet by mouth at bedtime as needed. For sleep       No current facility-administered medications for this visit.    Allergies:   No Known Allergies  Social History:  The patient  reports that he quit smoking about 13 months ago. His smoking use included Cigarettes and Cigars. He has a 30 pack-year smoking history. He does not have any smokeless tobacco history on file. He reports that he drinks alcohol. He reports that he does not use illicit drugs.   ROS:  Please see the history of present illness.      All other systems reviewed and negative.   OBJECTIVE: VS:  BP 148/83  Pulse 85  Ht 5\' 11"  (1.803 m)  Wt 286  lb (129.729 kg)  BMI 39.91 kg/m2 Well nourished, well developed, in no acute distress, obese  HEENT: normal Neck: JVD difficult to evaluate . Carotid bruit no bruit   Cardiac:  normal S1, S2; RRR; no murmur Lungs:  clear to auscultation bilaterally, no wheezing, rhonchi or rales Abd: soft, nontender, no hepatomegaly Ext: Edema none . Pulses regular and 2+ with symmetry  Skin: warm and dry Neuro:  CNs 2-12 intact, no focal abnormalities noted  EKG:  Normal sinus rhythm       Signed, Darci Needle III, MD 09/24/2013 10:44 AM

## 2013-09-28 ENCOUNTER — Telehealth: Payer: Self-pay | Admitting: Interventional Cardiology

## 2013-09-28 NOTE — Telephone Encounter (Signed)
New message    FYI MSG----   Wt dropped back to normal yesterday----but today, back up 4lbs.  Pt feels ok.  Per pt, you do not have to call him back.

## 2013-09-28 NOTE — Telephone Encounter (Signed)
returned pt call pt to contiue to take lasix 80mg  bid and k+ bid.pt to keep upcoming f/u appt.pt verbalized understanding.

## 2013-10-12 ENCOUNTER — Encounter: Payer: Self-pay | Admitting: Interventional Cardiology

## 2013-10-12 ENCOUNTER — Ambulatory Visit (INDEPENDENT_AMBULATORY_CARE_PROVIDER_SITE_OTHER): Payer: BC Managed Care – PPO | Admitting: Interventional Cardiology

## 2013-10-12 VITALS — BP 140/82 | HR 87 | Ht 71.0 in | Wt 283.0 lb

## 2013-10-12 DIAGNOSIS — I4891 Unspecified atrial fibrillation: Secondary | ICD-10-CM

## 2013-10-12 DIAGNOSIS — I10 Essential (primary) hypertension: Secondary | ICD-10-CM

## 2013-10-12 DIAGNOSIS — R0683 Snoring: Secondary | ICD-10-CM

## 2013-10-12 DIAGNOSIS — I447 Left bundle-branch block, unspecified: Secondary | ICD-10-CM

## 2013-10-12 DIAGNOSIS — R0609 Other forms of dyspnea: Secondary | ICD-10-CM

## 2013-10-12 DIAGNOSIS — I5032 Chronic diastolic (congestive) heart failure: Secondary | ICD-10-CM

## 2013-10-12 DIAGNOSIS — Z7901 Long term (current) use of anticoagulants: Secondary | ICD-10-CM

## 2013-10-12 MED ORDER — METOPROLOL TARTRATE 25 MG PO TABS: 12.5000 mg | ORAL_TABLET | Freq: Every day | ORAL | Status: AC

## 2013-10-12 NOTE — Progress Notes (Signed)
Patient ID: [facility], male   DOB: 06-23-1951, 62 y.o.   MRN: [id number]    [address] [place], [place]  [address] Phone: [phone] Fax:  [phone]  Date:  10/12/2013   ID:  N[facility], DOB [birth year], MRN [id number]  PCP:  Frederich Chick, MD   ASSESSMENT:  1. Paroxysmal atrial fibrillation, maintaining sinus rhythm after cardioversion on amiodarone therapy 2. Amiodarone therapy without side effects 3. Chronic anticoagulation therapy 4. Chronic diastolic heart failure, improved on aggressive diuretic regimen 5. Hypertension 6. Severe sleep apnea, not yet treated  PLAN:  1. Wean and discontinue beta blocker therapy 2. 2 month followup appointment 3. Therapy for sleep apnea 4. After sleep apnea is well treated, we will consider additional medication reductions.   SUBJECTIVE: [facility] Foley is a 62 y.o. male who has had acute on chronic diastolic heart failure precipitated by atrial fibrillation. 2 cardioversion procedures were required to be eventually get the patient in rhythm and stable. He needed to be fully loaded with amiodarone to maintain sinus rhythm. After reestablishing sinus rhythm we began weaning therapy including diuretics. On 40 mg of Lasix 40 mg twice a day he developed recurrent dyspnea and swelling. Furosemide was reinstituted at 80 mg twice a day and now he feels great.   Wt Readings from Last 3 Encounters:  10/12/13 283 lb (128.368 kg)  09/24/13 286 lb (129.729 kg)  09/05/13 286 lb (129.729 kg)     Past Medical History  Diagnosis Date  . Hypertension   . Atrial fibrillation with rapid ventricular response 08/02/2013  . Acute diastolic heart failure 08/02/2013    BNP greater than 2000   . Snoring 08/02/2013    Suspect sleep apnea   . Hypertension, essential 08/02/2013  . Morbid obesity 08/02/2013  . Insomnia 08/02/13  . Erectile dysfunction   . COPD (chronic obstructive pulmonary disease)     Moderate by PFTs with response to  bronchodilators, May 2014  . Left bundle branch block 08/02/2013    Current Outpatient Prescriptions  Medication Sig Dispense Refill  . acetaminophen (TYLENOL) 650 MG CR tablet Take 650 mg by mouth every 8 (eight) hours as needed for pain.      [name] ADVAIR DISKUS 250-50 MCG/DOSE AEPB Inhale 2 puffs into the lungs 2 (two) times daily.      [name] albuterol (PROVENTIL HFA;VENTOLIN HFA) 108 (90 BASE) MCG/ACT inhaler Inhale 2 puffs into the lungs every 6 (six) hours as needed for wheezing or shortness of breath.      [name] amiodarone (PACERONE) 200 MG tablet Take 1 tablet (200 mg total) by mouth daily.      [name] apixaban (ELIQUIS) 5 MG TABS tablet Take 1 tablet (5 mg total) by mouth 2 (two) times daily.  60 tablet  6  . Cholecalciferol (VITAMIN D PO) Take 1 tablet by mouth daily.      [name] diltiazem (CARDIZEM CD) 240 MG 24 hr capsule Take 1 capsule (240 mg total) by mouth daily.  30 capsule  11  . furosemide (LASIX) 40 MG tablet Take 2 tablets (80 mg total) by mouth 2 (two) times daily.  120 tablet  5  . metoprolol tartrate (LOPRESSOR) 25 MG tablet Take 1 tablet (25 mg total) by mouth daily.      [name] NASONEX 50 MCG/ACT nasal spray Place 2 sprays into the nose daily.      . potassium chloride SA (K-DUR,KLOR-CON) 20 MEQ tablet Take 1 tablet (20  mEq total) by mouth 2 (two) times daily.  60 tablet  5  . valsartan-hydrochlorothiazide (DIOVAN-HCT) 320-25 MG per tablet Do not resume until instructed by Dr. Katrinka Blazing.      [name] VIAGRA 100 MG tablet Take 100 mg by mouth daily as needed. For intercourse      . zolpidem (AMBIEN CR) 12.5 MG CR tablet Take 1 tablet by mouth at bedtime as needed. For sleep       No current facility-administered medications for this visit.    Allergies:   No Known Allergies  Social History:  The patient  reports that he quit smoking about 14 months ago. His smoking use included Cigarettes and Cigars. He has a 30 pack-year smoking history. He does not have any smokeless tobacco history on file. He  reports that he drinks alcohol. He reports that he does not use illicit drugs.   ROS:  Please see the history of present illness.   Denies edema, palpitations, syncope, and orthopnea.   All other systems reviewed and negative.   OBJECTIVE: VS:  BP 140/82  Pulse 87  Ht 5\' 11"  (1.803 m)  Wt 283 lb (128.368 kg)  BMI 39.49 kg/m2 Well nourished, well developed, in no acute distress, obese, erythematous face HEENT: normal Neck: JVD absent. Carotid bruit 2+  Cardiac:  normal S1, S2; RRR; no murmur Lungs:  clear to auscultation bilaterally, no wheezing, rhonchi or rales Abd: soft, nontender, no hepatomegaly Ext: Edema absent. Pulses 2+ Skin: warm and dry Neuro:  CNs 2-12 intact, no focal abnormalities noted  EKG:  Not repeated       Signed, Darci Needle III, MD 10/12/2013 10:55 AM

## 2013-10-12 NOTE — Patient Instructions (Addendum)
Decrease Metoprolol to 12.5 mg 1/2 tablet twice a day for 1 week and then Stop  You have a follow up appointment scheduled for 01/18/14 @3 :30 pm

## 2013-10-30 ENCOUNTER — Telehealth: Payer: Self-pay | Admitting: Cardiology

## 2013-10-30 ENCOUNTER — Encounter: Payer: Self-pay | Admitting: Cardiology

## 2013-10-30 NOTE — Telephone Encounter (Signed)
Please let patient know that he had successful CPAP titration and set up OV with me in 10 weeks 

## 2013-10-30 NOTE — Telephone Encounter (Signed)
To Danielle.  

## 2013-10-31 ENCOUNTER — Encounter: Payer: Self-pay | Admitting: General Surgery

## 2013-10-31 NOTE — Telephone Encounter (Signed)
Pt is aware and scheduled for 10 week sleep f/u

## 2013-10-31 NOTE — Telephone Encounter (Signed)
lvm for pt to return call °

## 2013-11-07 ENCOUNTER — Encounter: Payer: Self-pay | Admitting: Cardiology

## 2013-11-23 ENCOUNTER — Encounter: Payer: Self-pay | Admitting: Cardiology

## 2013-12-01 NOTE — Telephone Encounter (Signed)
This encounter was created in error - please disregard.

## 2014-01-07 ENCOUNTER — Encounter: Payer: Self-pay | Admitting: Cardiology

## 2014-01-09 ENCOUNTER — Ambulatory Visit: Payer: BC Managed Care – PPO | Admitting: Cardiology

## 2014-01-10 ENCOUNTER — Encounter: Payer: Self-pay | Admitting: General Surgery

## 2014-01-18 ENCOUNTER — Ambulatory Visit: Payer: BC Managed Care – PPO | Admitting: Interventional Cardiology

## 2014-01-18 ENCOUNTER — Ambulatory Visit: Payer: BC Managed Care – PPO | Admitting: Cardiology

## 2014-01-25 ENCOUNTER — Ambulatory Visit (INDEPENDENT_AMBULATORY_CARE_PROVIDER_SITE_OTHER): Payer: BC Managed Care – PPO | Admitting: Ophthalmology

## 2014-01-25 DIAGNOSIS — H2510 Age-related nuclear cataract, unspecified eye: Secondary | ICD-10-CM

## 2014-01-25 DIAGNOSIS — H3530 Unspecified macular degeneration: Secondary | ICD-10-CM

## 2014-01-25 DIAGNOSIS — I10 Essential (primary) hypertension: Secondary | ICD-10-CM

## 2014-01-25 DIAGNOSIS — H43819 Vitreous degeneration, unspecified eye: Secondary | ICD-10-CM

## 2014-01-25 DIAGNOSIS — D1809 Hemangioma of other sites: Secondary | ICD-10-CM

## 2014-01-25 DIAGNOSIS — H35039 Hypertensive retinopathy, unspecified eye: Secondary | ICD-10-CM

## 2014-02-01 ENCOUNTER — Encounter: Payer: Self-pay | Admitting: Interventional Cardiology

## 2014-02-15 ENCOUNTER — Ambulatory Visit: Payer: BC Managed Care – PPO | Admitting: Cardiology

## 2014-02-20 ENCOUNTER — Other Ambulatory Visit: Payer: Self-pay | Admitting: Interventional Cardiology

## 2014-02-26 ENCOUNTER — Other Ambulatory Visit: Payer: Self-pay | Admitting: *Deleted

## 2014-02-26 MED ORDER — APIXABAN 5 MG PO TABS
5.0000 mg | ORAL_TABLET | Freq: Two times a day (BID) | ORAL | Status: DC
Start: 2014-02-26 — End: 2014-05-21

## 2014-03-09 ENCOUNTER — Encounter: Payer: Self-pay | Admitting: Cardiology

## 2014-03-11 ENCOUNTER — Encounter: Payer: Self-pay | Admitting: Interventional Cardiology

## 2014-03-11 ENCOUNTER — Ambulatory Visit (INDEPENDENT_AMBULATORY_CARE_PROVIDER_SITE_OTHER): Payer: BC Managed Care – PPO | Admitting: Interventional Cardiology

## 2014-03-11 ENCOUNTER — Encounter: Payer: Self-pay | Admitting: Cardiology

## 2014-03-11 ENCOUNTER — Ambulatory Visit (INDEPENDENT_AMBULATORY_CARE_PROVIDER_SITE_OTHER): Payer: BC Managed Care – PPO | Admitting: Cardiology

## 2014-03-11 VITALS — BP 144/76 | HR 74 | Ht 71.0 in | Wt 293.0 lb

## 2014-03-11 VITALS — BP 148/82 | HR 73

## 2014-03-11 DIAGNOSIS — I10 Essential (primary) hypertension: Secondary | ICD-10-CM

## 2014-03-11 DIAGNOSIS — G4733 Obstructive sleep apnea (adult) (pediatric): Secondary | ICD-10-CM

## 2014-03-11 DIAGNOSIS — Z7901 Long term (current) use of anticoagulants: Secondary | ICD-10-CM

## 2014-03-11 DIAGNOSIS — Z79899 Other long term (current) drug therapy: Secondary | ICD-10-CM

## 2014-03-11 DIAGNOSIS — I5032 Chronic diastolic (congestive) heart failure: Secondary | ICD-10-CM

## 2014-03-11 DIAGNOSIS — I4891 Unspecified atrial fibrillation: Secondary | ICD-10-CM

## 2014-03-11 NOTE — Patient Instructions (Addendum)
Your physician recommends that you continue on your current medications as directed. Please refer to the Current Medication list given to you today.  Call the office when your return from your trip. [phone] ( We will stop Amiodarone when you return)  Your physician recommends that you schedule a follow-up appointment in: July 2015

## 2014-03-11 NOTE — Patient Instructions (Signed)
Your physician wants you to follow-up in: 6 months with Dr. Radford Pax. You will receive a reminder letter in the mail two months in advance. If you don't receive a letter, please call our office to schedule the follow-up appointment.

## 2014-03-11 NOTE — Progress Notes (Signed)
[facility], [place], [place]  [address] Phone: [phone] Fax:  [phone]  Date:  03/11/2014   ID:  Nicholas [facility], DOB 01/26/1951, MRN [id number]  PCP:  Jonathon Bellows, MD  Cardiologist:  Fransico Him, MD   History of Present Illness: [facility] is a 63 y.o. male with a history of PAF and HTN who presents today for evaluation of OSA.  Before CPAP he was a heavy snorer and he had a lot of daytime sleepiness.  He had very restless sleep.  He recently underwent PSG showing severe OSA with an AHI of 74/hr and underwent CPAP titration to 8cm H2O.  He is doing well with his CPAP.  He tolerates the nasal pillow mask and feels the pressure is adequate.  He feels rested in the am and has no daytime sleepiness.  He says that he sleeps better than he has in 20 years and does not have to take any sleep aides.     Wt Readings from Last 3 Encounters:  03/11/14 293 lb (132.904 kg)  10/12/13 283 lb (128.368 kg)  09/24/13 286 lb (129.729 kg)     Past Medical History  Diagnosis Date  . Hypertension   . Atrial fibrillation with rapid ventricular response 08/02/2013  . Acute diastolic heart failure 7/40/8144    BNP greater than 2000   . Snoring 08/02/2013    Suspect sleep apnea   . Hypertension, essential 08/02/2013  . Morbid obesity 08/02/2013  . Insomnia 08/02/13  . Erectile dysfunction   . COPD (chronic obstructive pulmonary disease)     Moderate by PFTs with response to bronchodilators, May 2014  . Left bundle branch block 08/02/2013  . OSA (obstructive sleep apnea)     severe OSA with AHI 74/hr now on CPAP at 8cm H2O    Current Outpatient Prescriptions  Medication Sig Dispense Refill  . acetaminophen (TYLENOL) 650 MG CR tablet Take 650 mg by mouth every 8 (eight) hours as needed for pain.      [name] ADVAIR DISKUS 250-50 MCG/DOSE AEPB Inhale 2 puffs into the lungs 2 (two) times daily.      [name] albuterol (PROVENTIL HFA;VENTOLIN HFA) 108 (90 BASE) MCG/ACT inhaler Inhale 2 puffs  into the lungs every 6 (six) hours as needed for wheezing or shortness of breath.      [name] amiodarone (PACERONE) 200 MG tablet Take 1 tablet (200 mg total) by mouth daily.      [name] apixaban (ELIQUIS) 5 MG TABS tablet Take 1 tablet (5 mg total) by mouth 2 (two) times daily.  60 tablet  2  . Cholecalciferol (VITAMIN D PO) Take 1 tablet by mouth daily.      [name] diltiazem (CARDIZEM CD) 240 MG 24 hr capsule Take 1 capsule (240 mg total) by mouth daily.  30 capsule  11  . furosemide (LASIX) 40 MG tablet Take 2 tablets (80 mg total) by mouth 2 (two) times daily.  120 tablet  5  . furosemide (LASIX) 40 MG tablet take 2 tablets by mouth twice a day  120 tablet  0  . NASONEX 50 MCG/ACT nasal spray Place 2 sprays into the nose daily.      . potassium chloride SA (K-DUR,KLOR-CON) 20 MEQ tablet Take 1 tablet (20 mEq total) by mouth 2 (two) times daily.  60 tablet  5  . valsartan-hydrochlorothiazide (DIOVAN-HCT) 320-25 MG per tablet Do not resume until instructed by Dr. [name].      [name]  VIAGRA 100 MG tablet Take 100 mg by mouth daily as needed. For intercourse      . zolpidem (AMBIEN CR) 12.5 MG CR tablet Take 1 tablet by mouth at bedtime as needed. For sleep       No current facility-administered medications for this visit.    Allergies:   No Known Allergies  Social History:  The patient  reports that he quit smoking about 19 months ago. His smoking use included Cigarettes and Cigars. He has a 30 pack-year smoking history. He does not have any smokeless tobacco history on file. He reports that he drinks alcohol. He reports that he does not use illicit drugs.   Family History:  The patient's family history includes Atrial fibrillation in his mother; COPD in his father; Cerebral aneurysm in his brother.   ROS:  Please see the history of present illness.      All other systems reviewed and negative.   PHYSICAL EXAM: VS:  BP 148/82  Pulse 73 Well nourished, well developed, in no acute distress HEENT: normal Neck:  no JVD Cardiac:  normal S1, S2; RRR; no murmur Lungs:  clear to auscultation bilaterally, no wheezing, rhonchi or rales Abd: soft, nontender, no hepatomegaly Ext: no edema Skin: warm and dry Neuro:  CNs 2-12 intact, no focal abnormalities noted       ASSESSMENT AND PLAN:  1. Severe OSA on CPAP and tolerating well - I will get a download from his DME 2. HTN borderline control - diltiazem/Diovan HCT 3. Obesity - I have encouarged him to get into a routine exercise regimen since he has gained weight  Followup with me in 6 months  Signed, Fransico Him, MD 03/11/2014 9:13 AM

## 2014-03-11 NOTE — Progress Notes (Signed)
Patient ID: [facility], male   DOB: 08-Feb-1951, 63 y.o.   MRN: [id number]    [address], [address] [place], [place]  [address] Phone: [phone] Fax:  [phone]  Date:  03/11/2014   ID:  Nicholas [facility], DOB 02-Jun-1951, MRN [id number]  PCP:  Jonathon Bellows, MD   ASSESSMENT:  1. Atrial fibrillation, suppressed with amiodarone  2. Chronic diastolic heart failure 3. Hypertension 4. Amiodarone therapy 5. Chronic Coumadin therapy   PLAN:  1. Discontinue amiodarone in late May with office visit 6-8 weeks after discontinuation of the medication. I am hopeful that treating sleep apnea will decrease the chance of having recurrent atrial fibrillation. 2. Otherwise no change in therapy   SUBJECTIVE: Nicholas Foley is a 63 y.o. male who is now wearing CPAP and doing great. He sleeps well with the CPAP. He denies medication side effects.   Wt Readings from Last 3 Encounters:  03/11/14 293 lb (132.904 kg)  10/12/13 283 lb (128.368 kg)  09/24/13 286 lb (129.729 kg)     Past Medical History  Diagnosis Date  . Hypertension   . Atrial fibrillation with rapid ventricular response 08/02/2013  . Acute diastolic heart failure 2/77/8242    BNP greater than 2000   . Snoring 08/02/2013    Suspect sleep apnea   . Hypertension, essential 08/02/2013  . Morbid obesity 08/02/2013  . Insomnia 08/02/13  . Erectile dysfunction   . COPD (chronic obstructive pulmonary disease)     Moderate by PFTs with response to bronchodilators, May 2014  . Left bundle branch block 08/02/2013    Current Outpatient Prescriptions  Medication Sig Dispense Refill  . acetaminophen (TYLENOL) 650 MG CR tablet Take 650 mg by mouth every 8 (eight) hours as needed for pain.      [name] ADVAIR DISKUS 250-50 MCG/DOSE AEPB Inhale 2 puffs into the lungs 2 (two) times daily.      [name] albuterol (PROVENTIL HFA;VENTOLIN HFA) 108 (90 BASE) MCG/ACT inhaler Inhale 2 puffs into the lungs every 6 (six) hours as needed for wheezing or  shortness of breath.      [name] amiodarone (PACERONE) 200 MG tablet Take 1 tablet (200 mg total) by mouth daily.      [name] apixaban (ELIQUIS) 5 MG TABS tablet Take 1 tablet (5 mg total) by mouth 2 (two) times daily.  60 tablet  2  . Cholecalciferol (VITAMIN D PO) Take 1 tablet by mouth daily.      [name] diltiazem (CARDIZEM CD) 240 MG 24 hr capsule Take 1 capsule (240 mg total) by mouth daily.  30 capsule  11  . furosemide (LASIX) 40 MG tablet Take 2 tablets (80 mg total) by mouth 2 (two) times daily.  120 tablet  5  . furosemide (LASIX) 40 MG tablet take 2 tablets by mouth twice a day  120 tablet  0  . NASONEX 50 MCG/ACT nasal spray Place 2 sprays into the nose daily.      . potassium chloride SA (K-DUR,KLOR-CON) 20 MEQ tablet Take 1 tablet (20 mEq total) by mouth 2 (two) times daily.  60 tablet  5  . valsartan-hydrochlorothiazide (DIOVAN-HCT) 320-25 MG per tablet Do not resume until instructed by Dr. [name].      [name] VIAGRA 100 MG tablet Take 100 mg by mouth daily as needed. For intercourse      . zolpidem (AMBIEN CR) 12.5 MG CR tablet Take 1 tablet by mouth at bedtime as needed. For sleep  No current facility-administered medications for this visit.    Allergies:   No Known Allergies  Social History:  The patient  reports that he quit smoking about 19 months ago. His smoking use included Cigarettes and Cigars. He has a 30 pack-year smoking history. He does not have any smokeless tobacco history on file. He reports that he drinks alcohol. He reports that he does not use illicit drugs.   ROS:  Please see the history of present illness.   No edema. Appetite is stable. No medication side effects.   All other systems reviewed and negative.   OBJECTIVE: VS:  BP 144/76  Pulse 74  Ht 5\' 11"  (1.803 m)  Wt 293 lb (132.904 kg)  BMI 40.88 kg/m2 Well nourished, well developed, in no acute distress, obese  HEENT: normal Neck: JVD flat. Carotid bruit absent  Cardiac:  normal S1, S2; RRR; no murmur Lungs:   clear to auscultation bilaterally, no wheezing, rhonchi or rales Abd: soft, nontender, no hepatomegaly Ext: Edema absent. Pulses 2+ and symmetric  Skin: warm and dry Neuro:  CNs 2-12 intact, no focal abnormalities noted  EKG:  Not repeated       Signed, Illene Labrador III, MD 03/11/2014 8:48 AM

## 2014-03-22 ENCOUNTER — Other Ambulatory Visit: Payer: Self-pay

## 2014-03-22 DIAGNOSIS — I4891 Unspecified atrial fibrillation: Secondary | ICD-10-CM

## 2014-03-22 DIAGNOSIS — I5031 Acute diastolic (congestive) heart failure: Secondary | ICD-10-CM

## 2014-03-22 MED ORDER — POTASSIUM CHLORIDE CRYS ER 20 MEQ PO TBCR: 20.0000 meq | EXTENDED_RELEASE_TABLET | Freq: Two times a day (BID) | ORAL | Status: DC

## 2014-03-22 MED ORDER — AMIODARONE HCL 200 MG PO TABS: 200.0000 mg | ORAL_TABLET | Freq: Every day | ORAL | Status: DC

## 2014-03-22 MED ORDER — FUROSEMIDE 40 MG PO TABS: 80.0000 mg | ORAL_TABLET | Freq: Two times a day (BID) | ORAL | Status: DC

## 2014-03-26 ENCOUNTER — Other Ambulatory Visit: Payer: Self-pay

## 2014-03-26 DIAGNOSIS — I5031 Acute diastolic (congestive) heart failure: Secondary | ICD-10-CM

## 2014-03-26 MED ORDER — FUROSEMIDE 40 MG PO TABS: 80.0000 mg | ORAL_TABLET | Freq: Two times a day (BID) | ORAL | Status: DC

## 2014-05-21 ENCOUNTER — Other Ambulatory Visit: Payer: Self-pay | Admitting: *Deleted

## 2014-05-21 MED ORDER — APIXABAN 5 MG PO TABS: 5.0000 mg | ORAL_TABLET | Freq: Two times a day (BID) | ORAL | Status: DC

## 2014-06-10 ENCOUNTER — Ambulatory Visit (INDEPENDENT_AMBULATORY_CARE_PROVIDER_SITE_OTHER): Payer: BC Managed Care – PPO | Admitting: Interventional Cardiology

## 2014-06-10 ENCOUNTER — Encounter: Payer: Self-pay | Admitting: Interventional Cardiology

## 2014-06-10 VITALS — BP 156/80 | HR 71 | Ht 71.0 in | Wt 295.0 lb

## 2014-06-10 DIAGNOSIS — I10 Essential (primary) hypertension: Secondary | ICD-10-CM

## 2014-06-10 DIAGNOSIS — I4891 Unspecified atrial fibrillation: Secondary | ICD-10-CM

## 2014-06-10 DIAGNOSIS — Z79899 Other long term (current) drug therapy: Secondary | ICD-10-CM

## 2014-06-10 DIAGNOSIS — I480 Paroxysmal atrial fibrillation: Secondary | ICD-10-CM

## 2014-06-10 DIAGNOSIS — I5032 Chronic diastolic (congestive) heart failure: Secondary | ICD-10-CM

## 2014-06-10 NOTE — Patient Instructions (Signed)
Your physician recommends that you continue on your current medications as directed. Please refer to the Current Medication list given to you today.  Your physician discussed the importance of regular exercise and recommended that you start or continue a regular exercise program for good health.  Decrease Salt Intake.   Your physician wants you to follow-up in: 6 months with Dr. [name]. You will receive a reminder letter in the mail two months in advance. If you don't receive a letter, please call our office to schedule the follow-up appointment.

## 2014-06-10 NOTE — Progress Notes (Signed)
Patient ID: Nicholas Foley, male   DOB: November 12, 1951, 63 y.o.   MRN: [id number]    [address], [address] [place], [place]  [address] Phone: [phone] Fax:  [phone]  Date:  06/10/2014   ID:  Nicholas Foley, DOB December 15, 1950, MRN [id number]  PCP:  Jonathon Bellows, MD    ASSESSMENT:  1. Paroxysmal atrial fibrillation, stable 2. Hypertension, controlled. Repeat blood pressure 137/78 mmHg 3. Obesity 4. Obstructive sleep apnea  PLAN:  1. Restriction of salt in diet 2. Aerobic activity, active lifestyle 3. Weight loss 4. Clinical followup in 6 months   SUBJECTIVE: Nicholas Foley is a 63 y.o. male was doing well. Still has some fatigue and dyspnea on exertion. Markedly improved compared to prior evaluations and went in atrial fibrillation. No chest pain. No syncope.   Wt Readings from Last 3 Encounters:  06/10/14 295 lb (133.811 kg)  03/11/14 293 lb (132.904 kg)  10/12/13 283 lb (128.368 kg)     Past Medical History  Diagnosis Date  . Hypertension   . Atrial fibrillation with rapid ventricular response 08/02/2013  . Acute diastolic heart failure 07/03/7516    BNP greater than 2000   . Snoring 08/02/2013    Suspect sleep apnea   . Hypertension, essential 08/02/2013  . Morbid obesity 08/02/2013  . Insomnia 08/02/13  . Erectile dysfunction   . COPD (chronic obstructive pulmonary disease)     Moderate by PFTs with response to bronchodilators, May 2014  . Left bundle branch block 08/02/2013  . OSA (obstructive sleep apnea)     severe OSA with AHI 74/hr now on CPAP at 8cm H2O    Current Outpatient Prescriptions  Medication Sig Dispense Refill  . amiodarone (PACERONE) 200 MG tablet Take 1 tablet (200 mg total) by mouth daily.  30 tablet  3  . apixaban (ELIQUIS) 5 MG TABS tablet Take 1 tablet (5 mg total) by mouth 2 (two) times daily.  60 tablet  0  . Cholecalciferol (VITAMIN D PO) Take 1 tablet by mouth daily.      [name] diltiazem (CARDIZEM CD) 240 MG 24 hr capsule Take 1 capsule  (240 mg total) by mouth daily.  30 capsule  11  . Fluticasone-Salmeterol (ADVAIR) 250-50 MCG/DOSE AEPB Inhale 2 puffs into the lungs 2 (two) times daily.      . furosemide (LASIX) 40 MG tablet Take 2 tablets (80 mg total) by mouth 2 (two) times daily.  90 tablet  5  . NASONEX 50 MCG/ACT nasal spray Place 2 sprays into the nose daily.      . potassium chloride SA (K-DUR,KLOR-CON) 20 MEQ tablet Take 1 tablet (20 mEq total) by mouth 2 (two) times daily.  60 tablet  5  . VIAGRA 100 MG tablet Take 100 mg by mouth daily as needed. For intercourse      . zolpidem (AMBIEN CR) 12.5 MG CR tablet Take 1 tablet by mouth at bedtime as needed. For sleep      . acetaminophen (TYLENOL) 650 MG CR tablet Take 650 mg by mouth every 8 (eight) hours as needed for pain.       No current facility-administered medications for this visit.    Allergies:   No Known Allergies  Social History:  The patient  reports that he quit smoking about 22 months ago. His smoking use included Cigarettes and Cigars. He has a 30 pack-year smoking history. He does not have any smokeless tobacco history on file. He  reports that he drinks alcohol. He reports that he does not use illicit drugs.   ROS:  Please see the history of present illness.   No episodes of neurological abnormality, denies bleeding, no lower extremity swelling, no orthopnea PND   All other systems reviewed and negative.   OBJECTIVE: VS:  BP 156/80  Pulse 71  Ht 5\' 11"  (1.803 m)  Wt 295 lb (133.811 kg)  BMI 41.16 kg/m2 Well nourished, well developed, in no acute distress, obese HEENT: normal Neck: JVD flat. Carotid bruit absent  Cardiac:  normal S1, S2; RRR; no murmur Lungs:  clear to auscultation bilaterally, no wheezing, rhonchi or rales Abd: soft, nontender, no hepatomegaly Ext: Edema absent. Pulses 2+ Skin: warm and dry Neuro:  CNs 2-12 intact, no focal abnormalities noted  EKG:  Not repeated       Signed, Illene Labrador III, MD 06/10/2014 8:36 AM

## 2014-06-21 ENCOUNTER — Other Ambulatory Visit: Payer: Self-pay

## 2014-06-21 MED ORDER — APIXABAN 5 MG PO TABS: 5.0000 mg | ORAL_TABLET | Freq: Two times a day (BID) | ORAL | Status: DC

## 2014-07-19 ENCOUNTER — Other Ambulatory Visit: Payer: Self-pay

## 2014-07-19 MED ORDER — DILTIAZEM HCL ER COATED BEADS 240 MG PO CP24: 240.0000 mg | ORAL_CAPSULE | Freq: Every day | ORAL | Status: DC

## 2014-08-11 ENCOUNTER — Encounter: Payer: Self-pay | Admitting: Cardiology

## 2014-09-04 ENCOUNTER — Other Ambulatory Visit: Payer: Self-pay | Admitting: *Deleted

## 2014-09-04 ENCOUNTER — Other Ambulatory Visit: Payer: Self-pay

## 2014-09-04 DIAGNOSIS — I5031 Acute diastolic (congestive) heart failure: Secondary | ICD-10-CM

## 2014-09-04 MED ORDER — POTASSIUM CHLORIDE CRYS ER 20 MEQ PO TBCR: 20.0000 meq | EXTENDED_RELEASE_TABLET | Freq: Two times a day (BID) | ORAL | Status: DC

## 2014-09-04 MED ORDER — FUROSEMIDE 40 MG PO TABS: 80.0000 mg | ORAL_TABLET | Freq: Two times a day (BID) | ORAL | Status: DC

## 2014-09-06 ENCOUNTER — Ambulatory Visit (INDEPENDENT_AMBULATORY_CARE_PROVIDER_SITE_OTHER): Payer: BC Managed Care – PPO | Admitting: Cardiology

## 2014-09-06 ENCOUNTER — Encounter: Payer: Self-pay | Admitting: Cardiology

## 2014-09-06 VITALS — BP 126/72 | HR 77 | Ht 71.0 in | Wt 295.8 lb

## 2014-09-06 DIAGNOSIS — I10 Essential (primary) hypertension: Secondary | ICD-10-CM

## 2014-09-06 DIAGNOSIS — G4733 Obstructive sleep apnea (adult) (pediatric): Secondary | ICD-10-CM

## 2014-09-06 NOTE — Progress Notes (Signed)
[facility] [address], [place], [place]  [address] Phone: [phone] Fax:  [phone]  Date:  09/06/2014   ID:  Nicholas Foley, DOB August 22, 1951, MRN [id number]  PCP:  Jonathon Bellows, MD  Cardiologist:  Fransico Him, MD    History of Present Illness: Nicholas Foley is a 63 y.o. male with a history of PAF, HTN and OSA.  He is doing well with his CPAP. He tolerates the nasal pillow mask and feels the pressure is adequate. He feels rested in the am and has no daytime sleepiness. He says that he sleeps better than he has in 20 years and does not have to take any sleep aides.     Wt Readings from Last 3 Encounters:  09/06/14 295 lb 12.8 oz (134.174 kg)  06/10/14 295 lb (133.811 kg)  03/11/14 293 lb (132.904 kg)     Past Medical History  Diagnosis Date  . Hypertension   . Atrial fibrillation with rapid ventricular response 08/02/2013  . Acute diastolic heart failure 2/42/6834    BNP greater than 2000   . Snoring 08/02/2013    Suspect sleep apnea   . Hypertension, essential 08/02/2013  . Morbid obesity 08/02/2013  . Insomnia 08/02/13  . Erectile dysfunction   . COPD (chronic obstructive pulmonary disease)     Moderate by PFTs with response to bronchodilators, May 2014  . Left bundle branch block 08/02/2013  . OSA (obstructive sleep apnea)     severe OSA with AHI 74/hr now on CPAP at 8cm H2O    Current Outpatient Prescriptions  Medication Sig Dispense Refill  . acetaminophen (TYLENOL) 650 MG CR tablet Take 650 mg by mouth every 8 (eight) hours as needed for pain.      [name] amiodarone (PACERONE) 200 MG tablet Take 1 tablet (200 mg total) by mouth daily.  30 tablet  3  . apixaban (ELIQUIS) 5 MG TABS tablet Take 1 tablet (5 mg total) by mouth 2 (two) times daily.  60 tablet  3  . Cholecalciferol (VITAMIN D PO) Take 1 tablet by mouth daily.      [name] diltiazem (CARDIZEM CD) 240 MG 24 hr capsule Take 1 capsule (240 mg total) by mouth daily.  30 capsule  6  . Fluticasone-Salmeterol (ADVAIR)  250-50 MCG/DOSE AEPB Inhale 2 puffs into the lungs 2 (two) times daily.      . furosemide (LASIX) 40 MG tablet Take 2 tablets (80 mg total) by mouth 2 (two) times daily.  120 tablet  5  . NASONEX 50 MCG/ACT nasal spray Place 2 sprays into the nose daily.      . potassium chloride SA (K-DUR,KLOR-CON) 20 MEQ tablet Take 1 tablet (20 mEq total) by mouth 2 (two) times daily.  60 tablet  5  . VIAGRA 100 MG tablet Take 100 mg by mouth daily as needed. For intercourse      . zolpidem (AMBIEN CR) 12.5 MG CR tablet Take 1 tablet by mouth at bedtime as needed. For sleep       No current facility-administered medications for this visit.    Allergies:   No Known Allergies  Social History:  The patient  reports that he quit smoking about 2 years ago. His smoking use included Cigarettes and Cigars. He has a 30 pack-year smoking history. He does not have any smokeless tobacco history on file. He reports that he drinks alcohol. He reports that he does not use illicit drugs.   Family History:  The patient's family history includes Atrial fibrillation in his mother; COPD in his father; Cerebral aneurysm in his brother.   ROS:  Please see the history of present illness.      All other systems reviewed and negative.   PHYSICAL EXAM: VS:  BP 126/72  Pulse 77  Ht 5\' 11"  (1.803 m)  Wt 295 lb 12.8 oz (134.174 kg)  BMI 41.27 kg/m2 Well nourished, well developed, in no acute distress HEENT: normal Neck: no JVD Cardiac:  normal S1, S2; RRR; no murmur Lungs:  clear to auscultation bilaterally, no wheezing, rhonchi or rales Abd: soft, nontender, no hepatomegaly Ext: trace edema Skin: warm and dry Neuro:  CNs 2-12 intact, no focal abnormalities noted  ASSESSMENT AND PLAN:  1. Severe OSA on CPAP and tolerating well - I will get a download from his DME  2. HTN with good control - diltiazem 3. Obesity - I have encouarged him to get into a routine exercise regimen and watch his portion  Followup with me in 6  months    Signed, Fransico Him, MD [facility] 09/06/2014 2:02 PM

## 2014-09-06 NOTE — Patient Instructions (Addendum)
Your physician recommends that you continue on your current medications as directed. Please refer to the Current Medication list given to you today.  Your physician wants you to follow-up in: 6 months with Dr Turner You will receive a reminder letter in the mail two months in advance. If you don't receive a letter, please call our office to schedule the follow-up appointment.  

## 2014-10-15 ENCOUNTER — Other Ambulatory Visit: Payer: Self-pay

## 2014-10-15 DIAGNOSIS — I4891 Unspecified atrial fibrillation: Secondary | ICD-10-CM

## 2014-10-15 MED ORDER — APIXABAN 5 MG PO TABS: 5.0000 mg | ORAL_TABLET | Freq: Two times a day (BID) | ORAL | Status: DC

## 2014-10-15 MED ORDER — AMIODARONE HCL 200 MG PO TABS: 200.0000 mg | ORAL_TABLET | Freq: Every day | ORAL | Status: DC

## 2014-12-19 ENCOUNTER — Encounter: Payer: Self-pay | Admitting: Cardiology

## 2014-12-20 ENCOUNTER — Ambulatory Visit: Payer: BC Managed Care – PPO | Admitting: Interventional Cardiology

## 2015-01-03 ENCOUNTER — Encounter: Payer: Self-pay | Admitting: Interventional Cardiology

## 2015-01-03 ENCOUNTER — Ambulatory Visit (INDEPENDENT_AMBULATORY_CARE_PROVIDER_SITE_OTHER): Payer: No Typology Code available for payment source | Admitting: Interventional Cardiology

## 2015-01-03 VITALS — BP 134/86 | HR 67 | Ht 71.0 in | Wt 305.0 lb

## 2015-01-03 DIAGNOSIS — I4891 Unspecified atrial fibrillation: Secondary | ICD-10-CM

## 2015-01-03 MED ORDER — AMIODARONE HCL 100 MG PO TABS: 100.0000 mg | ORAL_TABLET | Freq: Every day | ORAL | Status: DC

## 2015-01-03 MED ORDER — AMIODARONE HCL 200 MG PO TABS: 100.0000 mg | ORAL_TABLET | Freq: Every day | ORAL | Status: DC

## 2015-01-03 NOTE — Addendum Note (Signed)
Addended by: Thompson Grayer on: 01/03/2015 04:21 PM   Modules accepted: Orders

## 2015-01-03 NOTE — Progress Notes (Signed)
Cardiology Office Note   Date:  01/03/2015   ID:  Nicholas Foley, DOB 12/25/1950, MRN [id number]  PCP:  Jonathon Bellows, MD  Cardiologist:   [name], MD   Chief Complaint  Patient presents with  . Shortness of Breath    leg stiffness      History of Present Illness: Nicholas Foley is a 64 y.o. male who presents for  Paroxysmal atrial fibrillation. He has had no recurrence since cardioversion in October 2014. He is on amiodarone  100 mg per day and Eliquis 5 mg twice a day. No bleeding complications. He still complains of exertional fatigue, dyspnea on exertion, and overall decreased energy. He's had no wheezing. His appetite is stable. He wears a seat Mask. Sleep apnea is been treated now for greater than 6 months.    Past Medical History  Diagnosis Date  . Hypertension   . Atrial fibrillation with rapid ventricular response 08/02/2013  . Acute diastolic heart failure 8/75/6433    BNP greater than 2000   . Snoring 08/02/2013    Suspect sleep apnea   . Hypertension, essential 08/02/2013  . Morbid obesity 08/02/2013  . Insomnia 08/02/13  . Erectile dysfunction   . COPD (chronic obstructive pulmonary disease)     Moderate by PFTs with response to bronchodilators, May 2014  . Left bundle branch block 08/02/2013  . OSA (obstructive sleep apnea)     severe OSA with AHI 74/hr now on CPAP at 8cm H2O    Past Surgical History  Procedure Laterality Date  . Nasal septum surgery    . Vasectomy    . Tee without cardioversion N/A 08/06/2013    Procedure: TRANSESOPHAGEAL ECHOCARDIOGRAM (TEE);  Surgeon: Lelon Perla, MD;  Location: [facility] ENDOSCOPY;  Service: Cardiovascular;  Laterality: N/A;  . Cardioversion N/A 08/06/2013    Procedure: CARDIOVERSION;  Surgeon: Lelon Perla, MD;  Location: [facility] ENDOSCOPY;  Service: Cardiovascular;  Laterality: N/A;  spoke with [name]   . Cardioversion N/A 09/03/2013    Procedure: CARDIOVERSION;  Surgeon: [name], MD;  Location: [facility]  ENDOSCOPY;  Service: Cardiovascular;  Laterality: N/A;     Current Outpatient Prescriptions  Medication Sig Dispense Refill  . acetaminophen (TYLENOL) 650 MG CR tablet Take 650 mg by mouth every 8 (eight) hours as needed for pain.    [name] amiodarone (PACERONE) 200 MG tablet Take 0.5 tablets (100 mg total) by mouth daily. 15 tablet 6  . apixaban (ELIQUIS) 5 MG TABS tablet Take 1 tablet (5 mg total) by mouth 2 (two) times daily. 60 tablet 6  . Cholecalciferol (VITAMIN D PO) Take 1 tablet by mouth daily.    [name] CIALIS 20 MG tablet Take 20 mg by mouth daily as needed for erectile dysfunction.   0  . diltiazem (CARDIZEM CD) 240 MG 24 hr capsule Take 1 capsule (240 mg total) by mouth daily. 30 capsule 6  . Fluticasone-Salmeterol (ADVAIR) 250-50 MCG/DOSE AEPB Inhale 2 puffs into the lungs 2 (two) times daily.    . furosemide (LASIX) 40 MG tablet Take 2 tablets (80 mg total) by mouth 2 (two) times daily. 120 tablet 5  . potassium chloride SA (K-DUR,KLOR-CON) 20 MEQ tablet Take 1 tablet (20 mEq total) by mouth 2 (two) times daily. 60 tablet 5  . pravastatin (PRAVACHOL) 10 MG tablet Take 10 mg by mouth daily.   1  . sildenafil (VIAGRA) 100 MG tablet Take 100 mg by mouth daily as needed for erectile  dysfunction.    [name] zolpidem (AMBIEN CR) 12.5 MG CR tablet Take 1 tablet by mouth at bedtime as needed. For sleep     No current facility-administered medications for this visit.    Allergies:   Review of patient's allergies indicates no known allergies.    Social History:  The patient  reports that he quit smoking about 2 years ago. His smoking use included Cigarettes and Cigars. He has a 30 pack-year smoking history. He does not have any smokeless tobacco history on file. He reports that he drinks alcohol. He reports that he does not use illicit drugs.   Family History:  The patient's family history includes Atrial fibrillation in his mother; COPD in his father; Cerebral aneurysm in his brother.    ROS:   Please see the history of present illness.   Otherwise, review of systems are positive for  Upper respiratory and sinus trouble with nasal drainage..   All other systems are reviewed and negative.    PHYSICAL EXAM: VS:  BP 134/86 mmHg  Pulse 67  Ht 5\' 11"  (1.803 m)  Wt 305 lb (138.347 kg)  BMI 42.56 kg/m2 , BMI Body mass index is 42.56 kg/(m^2). GEN: Well nourished, well developed, in no acute distress HEENT: normal Neck: no JVD, carotid bruits, or masses Cardiac: RRR; no murmurs, rubs, or gallops,no edema  Respiratory:  clear to auscultation bilaterally, normal work of breathing GI: soft, nontender, nondistended, + BS MS: no deformity or atrophy Skin: warm and dry, no rash Neuro:  Strength and sensation are intact Psych: euthymic mood, full affect   EKG:  EKG is ordered today. The ekg ordered today demonstrates  Normal sinus rhythm with horizontal axis and nonspecific T-wave abnormality   Recent Labs: No results found for requested labs within last 365 days.    Lipid Panel    Component Value Date/Time   CHOL 184 08/03/2013 0640   TRIG 149 08/03/2013 0640   HDL 50 08/03/2013 0640   CHOLHDL 3.7 08/03/2013 0640   VLDL 30 08/03/2013 0640   LDLCALC 104* 08/03/2013 0640      Wt Readings from Last 3 Encounters:  01/03/15 305 lb (138.347 kg)  09/06/14 295 lb 12.8 oz (134.174 kg)  06/10/14 295 lb (133.811 kg)      Other studies Reviewed: Additional studies/ records that were reviewed today include:  none.    ASSESSMENT AND PLAN:  1.   Atrial fibrillation, with rhythm control on amiodarone. No recurrences since definitive cardioversion was performed in October 2014.  2. Chronic diastolic heart failure, controlled on current medical regimen.  3. Hypertension under excellent control  4. Obstructive sleep apnea related to obesity hypoventilation    Current medicines are reviewed at length with the patient today.  The patient has concerns regarding medicines.  The  following changes have been made:   I need to give the patient in opportunity to remain in sinus rhythm off of amiodarone. We will decrease amiodarone 100 mg per day for 6 months. If he remains in sinus rhythm I will then discontinue the medication and continue anticoagulation for an additional 6 months before switching to aspirin. A rationale is in now sleep apnea is under control, the stem was atrial fibrillation may have been removed.  Labs/ tests ordered today include:   Orders Placed This Encounter  Procedures  . EKG 12-Lead     Disposition:   FU with  Linard Millers in 6 month   Signed, [name], MD  01/03/2015 4:19 PM    [facility] [place], [place], [place]  [address] Phone: [phone]; Fax: [phone]

## 2015-01-03 NOTE — Patient Instructions (Signed)
Your physician wants you to follow-up in:  6 months. You will receive a reminder letter in the mail two months in advance. If you don't receive a letter, please call our office to schedule the follow-up appointment.  Your physician has recommended you make the following change in your medication: Decrease amiodarone to 100 mg by mouth daily

## 2015-01-31 ENCOUNTER — Ambulatory Visit (INDEPENDENT_AMBULATORY_CARE_PROVIDER_SITE_OTHER): Payer: BC Managed Care – PPO | Admitting: Ophthalmology

## 2015-02-03 ENCOUNTER — Other Ambulatory Visit: Payer: Self-pay | Admitting: *Deleted

## 2015-02-03 MED ORDER — DILTIAZEM HCL ER COATED BEADS 240 MG PO CP24: 240.0000 mg | ORAL_CAPSULE | Freq: Every day | ORAL | Status: DC

## 2015-02-27 ENCOUNTER — Other Ambulatory Visit: Payer: Self-pay

## 2015-02-27 DIAGNOSIS — I5031 Acute diastolic (congestive) heart failure: Secondary | ICD-10-CM

## 2015-02-27 MED ORDER — POTASSIUM CHLORIDE CRYS ER 20 MEQ PO TBCR: 20.0000 meq | EXTENDED_RELEASE_TABLET | Freq: Two times a day (BID) | ORAL | Status: DC

## 2015-02-27 MED ORDER — FUROSEMIDE 40 MG PO TABS: 80.0000 mg | ORAL_TABLET | Freq: Two times a day (BID) | ORAL | Status: DC

## 2015-03-07 ENCOUNTER — Ambulatory Visit: Payer: Self-pay | Admitting: Cardiology

## 2015-04-23 ENCOUNTER — Other Ambulatory Visit: Payer: Self-pay

## 2015-04-23 MED ORDER — APIXABAN 5 MG PO TABS: 5.0000 mg | ORAL_TABLET | Freq: Two times a day (BID) | ORAL | Status: DC

## 2015-04-24 NOTE — Progress Notes (Signed)
Cardiology Office Note   Date:  04/25/2015   ID:  Nicholas Foley, DOB 04/24/1951, MRN [id number]  PCP:  Jonathon Bellows, MD    Chief Complaint  Patient presents with  . Follow-up    OSA      History of Present Illness: Nicholas Foley is a 64 y.o. male with a history of PAF, HTN and OSA. He is doing well with his CPAP. He tolerates the nasal pillow mask and feels the pressure is adequate. He feels rested in the am and has no daytime sleepiness when he sleeps well at night.  He snores on occasion. He says that he sleeps better than he has in 20 years and does not have to take any sleep aides.   He has some mild dry mouth on occasion.  He sleeps on his sleep.      Past Medical History  Diagnosis Date  . Hypertension   . Atrial fibrillation with rapid ventricular response 08/02/2013  . Acute diastolic heart failure 7/61/6073    BNP greater than 2000   . Snoring 08/02/2013    Suspect sleep apnea   . Hypertension, essential 08/02/2013  . Morbid obesity 08/02/2013  . Insomnia 08/02/13  . Erectile dysfunction   . COPD (chronic obstructive pulmonary disease)     Moderate by PFTs with response to bronchodilators, May 2014  . Left bundle branch block 08/02/2013  . OSA (obstructive sleep apnea)     severe OSA with AHI 74/hr now on CPAP at 8cm H2O    Past Surgical History  Procedure Laterality Date  . Nasal septum surgery    . Vasectomy    . Tee without cardioversion N/A 08/06/2013    Procedure: TRANSESOPHAGEAL ECHOCARDIOGRAM (TEE);  Surgeon: Lelon Perla, MD;  Location: [facility] ENDOSCOPY;  Service: Cardiovascular;  Laterality: N/A;  . Cardioversion N/A 08/06/2013    Procedure: CARDIOVERSION;  Surgeon: Lelon Perla, MD;  Location: [facility] ENDOSCOPY;  Service: Cardiovascular;  Laterality: N/A;  spoke with [name]   . Cardioversion N/A 09/03/2013    Procedure: CARDIOVERSION;  Surgeon: [name], MD;  Location[facility] ENDOSCOPY;  Service: Cardiovascular;  Laterality: N/A;       Current Outpatient Prescriptions  Medication Sig Dispense Refill  . acetaminophen (TYLENOL) 650 MG CR tablet Take 650 mg by mouth every 8 (eight) hours as needed for pain.    [name] amiodarone (PACERONE) 100 MG tablet Take 1 tablet (100 mg total) by mouth daily. 30 tablet 11  . apixaban (ELIQUIS) 5 MG TABS tablet Take 1 tablet (5 mg total) by mouth 2 (two) times daily. 60 tablet 6  . Cholecalciferol (VITAMIN D PO) Take 1 tablet by mouth daily.    [name] CIALIS 20 MG tablet Take 20 mg by mouth daily as needed for erectile dysfunction.   0  . diltiazem (CARDIZEM CD) 240 MG 24 hr capsule Take 1 capsule (240 mg total) by mouth daily. 30 capsule 5  . fluticasone (FLONASE) 50 MCG/ACT nasal spray Place 1 spray into both nostrils daily.  0  . Fluticasone-Salmeterol (ADVAIR) 250-50 MCG/DOSE AEPB Inhale 1 puff into the lungs 2 (two) times daily.     . furosemide (LASIX) 40 MG tablet Take 2 tablets (80 mg total) by mouth 2 (two) times daily. 120 tablet 5  . potassium chloride SA (K-DUR,KLOR-CON) 20 MEQ tablet Take 1 tablet (20 mEq total) by mouth 2 (  two) times daily. 60 tablet 5  . pravastatin (PRAVACHOL) 10 MG tablet Take 10 mg by mouth daily.   1  . sildenafil (VIAGRA) 100 MG tablet Take 100 mg by mouth daily as needed for erectile dysfunction.    [name] zolpidem (AMBIEN CR) 12.5 MG CR tablet Take 1 tablet by mouth at bedtime as needed. For sleep     No current facility-administered medications for this visit.    Allergies:   Review of patient's allergies indicates no known allergies.    Social History:  The patient  reports that he quit smoking about 2 years ago. His smoking use included Cigarettes and Cigars. He has a 30 pack-year smoking history. He does not have any smokeless tobacco history on file. He reports that he drinks alcohol. He reports that he does not use illicit drugs.   Family History:  The patient's family history includes Atrial fibrillation in his mother; COPD in his father; Cerebral  aneurysm in his brother.    ROS:  Please see the history of present illness.   Otherwise, review of systems are positive for none.   All other systems are reviewed and negative.    PHYSICAL EXAM: VS:  BP 142/68 mmHg  Pulse 73  Ht 5\' 11"  (1.803 m)  Wt 303 lb 12.8 oz (137.803 kg)  BMI 42.39 kg/m2  SpO2 93% , BMI Body mass index is 42.39 kg/(m^2). GEN: Well nourished, well developed, in no acute distress HEENT: normal Neck: no JVD, carotid bruits, or masses Cardiac: RRR; no murmurs, rubs, or gallops,no edema  Respiratory:  clear to auscultation bilaterally, normal work of breathing GI: soft, nontender, nondistended, + BS MS: no deformity or atrophy Skin: warm and dry, no rash Neuro:  Strength and sensation are intact Psych: euthymic mood, full affect   EKG:  EKG is not ordered today.    Recent Labs: No results found for requested labs within last 365 days.    Lipid Panel    Component Value Date/Time   CHOL 184 08/03/2013 0640   TRIG 149 08/03/2013 0640   HDL 50 08/03/2013 0640   CHOLHDL 3.7 08/03/2013 0640   VLDL 30 08/03/2013 0640   LDLCALC 104* 08/03/2013 0640      Wt Readings from Last 3 Encounters:  04/25/15 303 lb 12.8 oz (137.803 kg)  01/03/15 305 lb (138.347 kg)  09/06/14 295 lb 12.8 oz (134.174 kg)    ASSESSMENT AND PLAN: 1.  Severe OSA on CPAP and tolerating well.  His d/l today showed an AHI of 2/hr on 8cm H2O and 100% compliance in using more than 4 hours nightly.  - We discussed proper sleep hygiene, avoidance of sleeping in the supine position and avoidance of alcohol within 4 hours of bedtime.  The patient was also instructed to avoid driving if sleepy.   2.  HTN with good control - continue diltiazem 3.  Obesity - I have encouarged him to get into a routine exercise regimen and watch his portion   Current medicines are reviewed at length with the patient today.  The patient does not have concerns regarding medicines.  The following changes have  been made:  no change  Labs/ tests ordered today: See above Assessment and Plan No orders of the defined types were placed in this encounter.     Disposition:   FU with me in 1 year  Signed, Sueanne Margarita, MD  04/25/2015 3:48 PM    [address],  [place], [place]  [address] Phone: [phone]; Fax: [phone]

## 2015-04-25 ENCOUNTER — Encounter: Payer: Self-pay | Admitting: Cardiology

## 2015-04-25 ENCOUNTER — Ambulatory Visit (INDEPENDENT_AMBULATORY_CARE_PROVIDER_SITE_OTHER): Payer: No Typology Code available for payment source | Admitting: Cardiology

## 2015-04-25 VITALS — BP 142/68 | HR 73 | Ht 71.0 in | Wt 303.8 lb

## 2015-04-25 DIAGNOSIS — I10 Essential (primary) hypertension: Secondary | ICD-10-CM

## 2015-04-25 DIAGNOSIS — G4733 Obstructive sleep apnea (adult) (pediatric): Secondary | ICD-10-CM | POA: Diagnosis not present

## 2015-04-25 NOTE — Patient Instructions (Signed)

## 2015-04-29 ENCOUNTER — Other Ambulatory Visit: Payer: Self-pay

## 2015-05-06 ENCOUNTER — Telehealth: Payer: Self-pay | Admitting: *Deleted

## 2015-05-06 NOTE — Telephone Encounter (Signed)
called to let them know that the Amio had been decreased and a new RX sent in at the [facility] 2//12/16, they needed to fix that in there system, spoke to [place] who said they would fix that.

## 2015-05-08 ENCOUNTER — Encounter: Payer: Self-pay | Admitting: Cardiology

## 2015-07-21 ENCOUNTER — Other Ambulatory Visit: Payer: Self-pay | Admitting: *Deleted

## 2015-07-21 MED ORDER — DILTIAZEM HCL ER COATED BEADS 240 MG PO CP24: 240.0000 mg | ORAL_CAPSULE | Freq: Every day | ORAL | Status: DC

## 2015-08-22 ENCOUNTER — Encounter: Payer: Self-pay | Admitting: Interventional Cardiology

## 2015-08-22 ENCOUNTER — Ambulatory Visit (INDEPENDENT_AMBULATORY_CARE_PROVIDER_SITE_OTHER): Payer: No Typology Code available for payment source | Admitting: Interventional Cardiology

## 2015-08-22 ENCOUNTER — Telehealth: Payer: Self-pay

## 2015-08-22 VITALS — BP 156/84 | HR 74 | Ht 71.0 in | Wt 312.1 lb

## 2015-08-22 DIAGNOSIS — Z79899 Other long term (current) drug therapy: Secondary | ICD-10-CM

## 2015-08-22 DIAGNOSIS — I10 Essential (primary) hypertension: Secondary | ICD-10-CM

## 2015-08-22 DIAGNOSIS — I5032 Chronic diastolic (congestive) heart failure: Secondary | ICD-10-CM | POA: Diagnosis not present

## 2015-08-22 DIAGNOSIS — I480 Paroxysmal atrial fibrillation: Secondary | ICD-10-CM

## 2015-08-22 DIAGNOSIS — G4733 Obstructive sleep apnea (adult) (pediatric): Secondary | ICD-10-CM

## 2015-08-22 DIAGNOSIS — Z7901 Long term (current) use of anticoagulants: Secondary | ICD-10-CM

## 2015-08-22 NOTE — Patient Instructions (Signed)
Medication Instructions:  Your physician recommends that you continue on your current medications as directed. Please refer to the Current Medication list given to you today.   Labwork: Tsh, Hepatic today  Your physician recommends that you return for lab work in: 1 year Tsh, hepatic   Testing/Procedures: A chest x-ray takes a picture of the organs and structures inside the chest, including the heart, lungs, and blood vessels. This test can show several things, including, whether the heart is enlarges; whether fluid is building up in the lungs; and whether pacemaker / defibrillator leads are still in place. (To be performed in Sept.2017 @ [facility])  Follow-Up: Your physician wants you to follow-up in: 1 year with Dr.Smith You will receive a reminder letter in the mail two months in advance. If you don't receive a letter, please call our office to schedule the follow-up appointment.   Any Other Special Instructions Will Be Listed Below (If Applicable). Your physician encouraged you to lose weight for better health. Please obtain a personal trainer and start a exercise regimen 30 minutes 3 times a week.

## 2015-08-22 NOTE — Progress Notes (Signed)
Cardiology Office Note   Date:  08/22/2015   ID:  Nicholas Foley, DOB 1951-04-02, MRN [id number]  PCP:  Jonathon Bellows, MD  Cardiologist:  [name], MD   Chief Complaint  Patient presents with  . Atrial Fibrillation      History of Present Illness: Nicholas Foley is a 64 y.o. male who presents for paroxysmal atrial fibrillation, amiodarone therapy, obstructive sleep apnea, hypertension, chronic diastolic heart failure, morbid obesity, and left bundle branch block.  He is doing well and denies any prolonged palpitations. He has not had syncope. He does have chronic dyspnea. He is able to lie flat in bed. He is wearing CPAP. He denies excessive lower extremity swelling. He has not had angina. No blood in the urine or stool. He has not fallen or had head trauma.  When I ask about his activity level, it appears that he is going to work in the mornings, sitting at a desk, returns home and sits in his recliner for a couple hours before going to bed each night. Absolutely no physical activity at all.    Past Medical History  Diagnosis Date  . Hypertension   . Atrial fibrillation with rapid ventricular response 08/02/2013  . Acute diastolic heart failure 5/62/1308    BNP greater than 2000   . Snoring 08/02/2013    Suspect sleep apnea   . Hypertension, essential 08/02/2013  . Morbid obesity 08/02/2013  . Insomnia 08/02/13  . Erectile dysfunction   . COPD (chronic obstructive pulmonary disease)     Moderate by PFTs with response to bronchodilators, May 2014  . Left bundle branch block 08/02/2013  . OSA (obstructive sleep apnea)     severe OSA with AHI 74/hr now on CPAP at 8cm H2O    Past Surgical History  Procedure Laterality Date  . Nasal septum surgery    . Vasectomy    . Tee without cardioversion N/A 08/06/2013    Procedure: TRANSESOPHAGEAL ECHOCARDIOGRAM (TEE);  Surgeon: Lelon Perla, MD;  Location: [facility] ENDOSCOPY;  Service: Cardiovascular;  Laterality: N/A;  .  Cardioversion N/A 08/06/2013    Procedure: CARDIOVERSION;  Surgeon: Lelon Perla, MD;  Location: [facility] ENDOSCOPY;  Service: Cardiovascular;  Laterality: N/A;  spoke with [name]   . Cardioversion N/A 09/03/2013    Procedure: CARDIOVERSION;  Surgeon: [name], MD;  Location: [facility] ENDOSCOPY;  Service: Cardiovascular;  Laterality: N/A;     Current Outpatient Prescriptions  Medication Sig Dispense Refill  . acetaminophen (TYLENOL) 650 MG CR tablet Take 650 mg by mouth every 8 (eight) hours as needed for pain.    [name] amiodarone (PACERONE) 100 MG tablet Take 1 tablet (100 mg total) by mouth daily. 30 tablet 11  . apixaban (ELIQUIS) 5 MG TABS tablet Take 1 tablet (5 mg total) by mouth 2 (two) times daily. 60 tablet 6  . Cholecalciferol (VITAMIN D PO) Take 1 tablet by mouth daily.    [name] CIALIS 20 MG tablet Take 20 mg by mouth daily as needed for erectile dysfunction.   0  . diltiazem (CARDIZEM CD) 240 MG 24 hr capsule Take 1 capsule (240 mg total) by mouth daily. 30 capsule 1  . fluticasone (FLONASE) 50 MCG/ACT nasal spray Place 1 spray into both nostrils daily.  0  . Fluticasone-Salmeterol (ADVAIR) 250-50 MCG/DOSE AEPB Inhale 1 puff into the lungs 2 (two) times daily.     . furosemide (LASIX) 40 MG tablet Take 2 tablets (80 mg total)  by mouth 2 (two) times daily. 120 tablet 5  . potassium chloride SA (K-DUR,KLOR-CON) 20 MEQ tablet Take 1 tablet (20 mEq total) by mouth 2 (two) times daily. 60 tablet 5  . pravastatin (PRAVACHOL) 10 MG tablet Take 10 mg by mouth daily.   1  . sildenafil (VIAGRA) 100 MG tablet Take 100 mg by mouth daily as needed for erectile dysfunction.    [name] zolpidem (AMBIEN CR) 12.5 MG CR tablet Take 1 tablet by mouth at bedtime as needed. For sleep     No current facility-administered medications for this visit.    Allergies:   Review of patient's allergies indicates no known allergies.    Social History:  The patient  reports that he quit smoking about 3 years ago. His  smoking use included Cigarettes and Cigars. He has a 30 pack-year smoking history. He has never used smokeless tobacco. He reports that he drinks alcohol. He reports that he does not use illicit drugs.   Family History:  The patient's family history includes Atrial fibrillation in his mother; COPD in his father; Cerebral aneurysm in his brother.    ROS:  Please see the history of present illness.   Otherwise, review of systems are positive for nasal congestion, occasional wheezing, back discomfort, muscle pain. He also has snoring, excessive fatigue, sweating. He advocates that C Pap is being used as prescribed..   All other systems are reviewed and negative.    PHYSICAL EXAM: VS:  BP 156/84 mmHg  Pulse 74  Ht 5\' 11"  (1.803 m)  Wt 141.577 kg (312 lb 1.9 oz)  BMI 43.55 kg/m2 , BMI Body mass index is 43.55 kg/(m^2). GEN: Well nourished, well developed, in no acute distress HEENT: normal Neck: no JVD, carotid bruits, or masses Cardiac: RRR.  There is no murmur, rub, or gallop. There is no edema. Respiratory:  clear to auscultation bilaterally, normal work of breathing. GI: soft, nontender, nondistended, + BS MS: no deformity or atrophy Skin: warm and dry, no rash Neuro:  Strength and sensation are intact Psych: euthymic mood, full affect   EKG:  EKG is  ordered today and reveals normal sinus rhythm with mild first degree AV block.the left bundle branch block is resolved on today's tracing.    Recent Labs: No results found for requested labs within last 365 days.    Lipid Panel    Component Value Date/Time   CHOL 184 08/03/2013 0640   TRIG 149 08/03/2013 0640   HDL 50 08/03/2013 0640   CHOLHDL 3.7 08/03/2013 0640   VLDL 30 08/03/2013 0640   LDLCALC 104* 08/03/2013 0640      Wt Readings from Last 3 Encounters:  08/22/15 141.577 kg (312 lb 1.9 oz)  04/25/15 137.803 kg (303 lb 12.8 oz)  01/03/15 138.347 kg (305 lb)      Other studies Reviewed: Additional studies/  records that were reviewed today include: no recent hospitalizations or hospital visits.. The findings include none..    ASSESSMENT AND PLAN:  1. Paroxysmal a-fib No recurrence  2. Chronic diastolic heart failure Volume status is stable  3. Morbid obesity Weight is increasing and this is his most pressing current issue.  4. Hypertension, essential elevated  5. Chronic anticoagulation No complications  6. OSA (obstructive sleep apnea) Advocates compliance with therapy   Current medicines are reviewed at length with the patient today.  The patient has the following concerns regarding medicines: none.  The following changes/actions have been instituted:    Enforce 2  g sodium diet  Increase physical activity. I have recommended a physical trainer to provide some accountability for physical activity.  Decreased caloric intake  Amiodarone monitoring today and in 6 months upon return with TSH, liver panel, and PA and lateral chest x-ray  Labs/ tests ordered today include:   Orders Placed This Encounter  Procedures  . DG Chest 2 View  . TSH  . Hepatic function panel  . EKG 12-Lead     Disposition:   FU with HS in 6 months  Signed, [name], MD  08/22/2015 10:02 AM    [facility] [address], [place], [place]  [address] Phone: [phone]; Fax: [phone]

## 2015-08-22 NOTE — Telephone Encounter (Signed)
Patient could not wait today to have his lab done. Reordered labs for Monday.

## 2015-08-25 ENCOUNTER — Other Ambulatory Visit: Payer: Self-pay

## 2015-08-25 ENCOUNTER — Other Ambulatory Visit (INDEPENDENT_AMBULATORY_CARE_PROVIDER_SITE_OTHER): Payer: No Typology Code available for payment source | Admitting: *Deleted

## 2015-08-25 DIAGNOSIS — Z79899 Other long term (current) drug therapy: Secondary | ICD-10-CM | POA: Diagnosis not present

## 2015-08-25 DIAGNOSIS — I5031 Acute diastolic (congestive) heart failure: Secondary | ICD-10-CM

## 2015-08-25 LAB — TSH: TSH: 1.75 u[IU]/mL (ref 0.35–4.50)

## 2015-08-25 LAB — HEPATIC FUNCTION PANEL
ALBUMIN: 3.9 g/dL (ref 3.5–5.2)
ALT: 21 U/L (ref 0–53)
AST: 15 U/L (ref 0–37)
Alkaline Phosphatase: 70 U/L (ref 39–117)
Bilirubin, Direct: 0.1 mg/dL (ref 0.0–0.3)
Total Bilirubin: 0.4 mg/dL (ref 0.2–1.2)
Total Protein: 7.1 g/dL (ref 6.0–8.3)

## 2015-08-25 NOTE — Telephone Encounter (Signed)
Belva Crome, MD at 08/22/2015 9:56 AM  potassium chloride SA (K-DUR,KLOR-CON) 20 MEQ tabletTake 1 tablet (20 mEq total) by mouth 2 (two) times daily Patient Instructions     Medication Instructions:  Your physician recommends that you continue on your current medications as directed. Please refer to the Current Medication list given to you today.

## 2015-08-26 ENCOUNTER — Telehealth: Payer: Self-pay

## 2015-08-26 NOTE — Telephone Encounter (Signed)
Pt aware of lab results. Normal Pt verbalized understanding.

## 2015-08-26 NOTE — Telephone Encounter (Signed)
-----   Message from Belva Crome, MD sent at 08/25/2015  9:29 PM EDT ----- normal

## 2015-08-28 MED ORDER — POTASSIUM CHLORIDE CRYS ER 20 MEQ PO TBCR: 20.0000 meq | EXTENDED_RELEASE_TABLET | Freq: Two times a day (BID) | ORAL | Status: DC

## 2015-09-17 ENCOUNTER — Other Ambulatory Visit: Payer: Self-pay | Admitting: *Deleted

## 2015-09-17 DIAGNOSIS — I5031 Acute diastolic (congestive) heart failure: Secondary | ICD-10-CM

## 2015-09-17 MED ORDER — FUROSEMIDE 40 MG PO TABS: 80.0000 mg | ORAL_TABLET | Freq: Two times a day (BID) | ORAL | Status: DC

## 2015-09-22 ENCOUNTER — Other Ambulatory Visit: Payer: Self-pay

## 2015-09-22 MED ORDER — DILTIAZEM HCL ER COATED BEADS 240 MG PO CP24: 240.0000 mg | ORAL_CAPSULE | Freq: Every day | ORAL | Status: DC

## 2015-10-21 ENCOUNTER — Other Ambulatory Visit: Payer: Self-pay | Admitting: *Deleted

## 2015-10-21 DIAGNOSIS — I5031 Acute diastolic (congestive) heart failure: Secondary | ICD-10-CM

## 2015-10-21 MED ORDER — POTASSIUM CHLORIDE CRYS ER 20 MEQ PO TBCR: 20.0000 meq | EXTENDED_RELEASE_TABLET | Freq: Two times a day (BID) | ORAL | Status: DC

## 2015-11-19 ENCOUNTER — Other Ambulatory Visit: Payer: Self-pay

## 2015-11-19 MED ORDER — APIXABAN 5 MG PO TABS: 5.0000 mg | ORAL_TABLET | Freq: Two times a day (BID) | ORAL | Status: DC

## 2015-11-19 NOTE — Telephone Encounter (Signed)
Belva Crome, MD at 08/22/2015 9:56 AM  apixaban (ELIQUIS) 5 MG TABS tabletTake 1 tablet (5 mg total) by mouth 2 (two) times daily Patient Instructions     Medication Instructions:  Your physician recommends that you continue on your current medications as directed. Please refer to the Current Medication list given to you today.

## 2016-01-16 ENCOUNTER — Other Ambulatory Visit: Payer: Self-pay

## 2016-01-16 MED ORDER — AMIODARONE HCL 100 MG PO TABS: 100.0000 mg | ORAL_TABLET | Freq: Every day | ORAL | Status: DC

## 2016-01-30 ENCOUNTER — Ambulatory Visit (INDEPENDENT_AMBULATORY_CARE_PROVIDER_SITE_OTHER): Payer: 59 | Admitting: Ophthalmology

## 2016-01-30 DIAGNOSIS — H35033 Hypertensive retinopathy, bilateral: Secondary | ICD-10-CM | POA: Diagnosis not present

## 2016-01-30 DIAGNOSIS — H43813 Vitreous degeneration, bilateral: Secondary | ICD-10-CM

## 2016-01-30 DIAGNOSIS — H353122 Nonexudative age-related macular degeneration, left eye, intermediate dry stage: Secondary | ICD-10-CM | POA: Diagnosis not present

## 2016-01-30 DIAGNOSIS — D1809 Hemangioma of other sites: Secondary | ICD-10-CM | POA: Diagnosis not present

## 2016-01-30 DIAGNOSIS — I10 Essential (primary) hypertension: Secondary | ICD-10-CM

## 2016-02-06 ENCOUNTER — Ambulatory Visit (INDEPENDENT_AMBULATORY_CARE_PROVIDER_SITE_OTHER): Payer: Self-pay | Admitting: Ophthalmology

## 2016-02-10 ENCOUNTER — Other Ambulatory Visit: Payer: Self-pay | Admitting: *Deleted

## 2016-02-10 MED ORDER — APIXABAN 5 MG PO TABS: 5.0000 mg | ORAL_TABLET | Freq: Two times a day (BID) | ORAL | Status: DC

## 2016-04-23 ENCOUNTER — Encounter: Payer: Self-pay | Admitting: Cardiology

## 2016-04-23 ENCOUNTER — Ambulatory Visit (INDEPENDENT_AMBULATORY_CARE_PROVIDER_SITE_OTHER): Payer: 59 | Admitting: Cardiology

## 2016-04-23 VITALS — BP 140/84 | HR 88 | Ht 71.0 in | Wt 307.8 lb

## 2016-04-23 DIAGNOSIS — G4733 Obstructive sleep apnea (adult) (pediatric): Secondary | ICD-10-CM

## 2016-04-23 DIAGNOSIS — I10 Essential (primary) hypertension: Secondary | ICD-10-CM

## 2016-04-23 NOTE — Progress Notes (Signed)
Cardiology Office Note    Date:  04/23/2016   ID:  YUAN ILGENFRITZ, DOB [birth year], MRN [id number]  PCP:  Jonathon Bellows, MD  Cardiologist:  Fransico Him, MD   Chief Complaint  Patient presents with  . Sleep Apnea  . Hypertension    History of Present Illness:  Nicholas Foley is a 65 y.o. male with a history of PAF, HTN and OSA. He is doing well with his CPAP. He tolerates the nasal pillow mask and feels the pressure is adequate. He feels rested in the am and has no daytime sleepiness when he sleeps well at night. He does not think that he snores anymore.  He occasionally will have some mouth dryness.  He has chronic nasal congestion but has improved with the CPAP.   Past Medical History  Diagnosis Date  . Hypertension   . Atrial fibrillation with rapid ventricular response ([place]) 08/02/2013  . Acute diastolic heart failure ([place]) 08/02/2013    BNP greater than 2000   . Snoring 08/02/2013    Suspect sleep apnea   . Hypertension, essential 08/02/2013  . Morbid obesity ([place]) 08/02/2013  . Insomnia 08/02/13  . Erectile dysfunction   . COPD (chronic obstructive pulmonary disease) (H[facility])     Moderate by PFTs with response to bronchodilators, May 2014  . Left bundle branch block 08/02/2013  . OSA (obstructive sleep apnea)     severe OSA with AHI 74/hr now on CPAP at 8cm H2O    Past Surgical History  Procedure Laterality Date  . Nasal septum surgery    . Vasectomy    . Tee without cardioversion N/A 08/06/2013    Procedure: TRANSESOPHAGEAL ECHOCARDIOGRAM (TEE);  Surgeon: Lelon Perla, MD;  Location: [facility] ENDOSCOPY;  Service: Cardiovascular;  Laterality: N/A;  . Cardioversion N/A 08/06/2013    Procedure: CARDIOVERSION;  Surgeon: Lelon Perla, MD;  Location: [facility] ENDOSCOPY;  Service: Cardiovascular;  Laterality: N/A;  spoke with [name]   . Cardioversion N/A 09/03/2013    Procedure: CARDIOVERSION;  Surgeon: [name], MD;  Location: [facility] ENDOSCOPY;  Service: Cardiovascular;   Laterality: N/A;    Current Medications: Outpatient Prescriptions Prior to Visit  Medication Sig Dispense Refill  . acetaminophen (TYLENOL) 650 MG CR tablet Take 650 mg by mouth every 8 (eight) hours as needed for pain.    [name] amiodarone (PACERONE) 100 MG tablet Take 1 tablet (100 mg total) by mouth daily. 30 tablet 6  . apixaban (ELIQUIS) 5 MG TABS tablet Take 1 tablet (5 mg total) by mouth 2 (two) times daily. 180 tablet 1  . Cholecalciferol (VITAMIN D PO) Take 1 tablet by mouth daily.    [name] CIALIS 20 MG tablet Take 20 mg by mouth daily as needed for erectile dysfunction.   0  . diltiazem (CARDIZEM CD) 240 MG 24 hr capsule Take 1 capsule (240 mg total) by mouth daily. 30 capsule 7  . fluticasone (FLONASE) 50 MCG/ACT nasal spray Place 1 spray into both nostrils daily.  0  . Fluticasone-Salmeterol (ADVAIR) 250-50 MCG/DOSE AEPB Inhale 1 puff into the lungs 2 (two) times daily.     . furosemide (LASIX) 40 MG tablet Take 2 tablets (80 mg total) by mouth 2 (two) times daily. 120 tablet 10  . potassium chloride SA (K-DUR,KLOR-CON) 20 MEQ tablet Take 1 tablet (20 mEq total) by mouth 2 (two) times daily. 60 tablet 10  . pravastatin (PRAVACHOL) 10 MG tablet Take 10 mg by mouth daily.  1  . sildenafil (VIAGRA) 100 MG tablet Take 100 mg by mouth daily as needed for erectile dysfunction.    [name] zolpidem (AMBIEN CR) 12.5 MG CR tablet Take 1 tablet by mouth at bedtime as needed. For sleep     No facility-administered medications prior to visit.     Allergies:   Review of patient's allergies indicates no known allergies.   Social History   Social History  . Marital Status: Married    Spouse Name: N/A  . Number of Children: N/A  . Years of Education: N/A   Social History Main Topics  . Smoking status: Former Smoker -- 1.00 packs/day for 30 years    Types: Cigarettes, Cigars    Quit date: 08/02/2012  . Smokeless tobacco: Never Used  . Alcohol Use: 0.0 oz/week    0 Standard drinks or equivalent per  week     Comment: occ  . Drug Use: No  . Sexual Activity: Yes   Other Topics Concern  . None   Social History Narrative     Family History:  The patient's family history includes Atrial fibrillation in his mother; COPD in his father; Cerebral aneurysm in his brother.   ROS:   Please see the history of present illness.    ROS All other systems reviewed and are negative.   PHYSICAL EXAM:   VS:  BP 140/84 mmHg  Pulse 88  Ht 5\' 11"  (1.803 m)  Wt 307 lb 12.8 oz (139.617 kg)  BMI 42.95 kg/m2   GEN: Well nourished, well developed, in no acute distress HEENT: normal Neck: no JVD, carotid bruits, or masses Cardiac: RRR; no murmurs, rubs, or gallops,no edema.  Intact distal pulses bilaterally.  Respiratory:  clear to auscultation bilaterally, normal work of breathing GI: soft, nontender, nondistended, + BS MS: no deformity or atrophy Skin: warm and dry, no rash Neuro:  Alert and Oriented x 3, Strength and sensation are intact Psych: euthymic mood, full affect  Wt Readings from Last 3 Encounters:  04/23/16 307 lb 12.8 oz (139.617 kg)  08/22/15 312 lb 1.9 oz (141.577 kg)  04/25/15 303 lb 12.8 oz (137.803 kg)      Studies/Labs Reviewed:   EKG:  EKG is not ordered today.    Recent Labs: 08/25/2015: ALT 21; TSH 1.75   Lipid Panel    Component Value Date/Time   CHOL 184 08/03/2013 0640   TRIG 149 08/03/2013 0640   HDL 50 08/03/2013 0640   CHOLHDL 3.7 08/03/2013 0640   VLDL 30 08/03/2013 0640   LDLCALC 104* 08/03/2013 0640    Additional studies/ records that were reviewed today include:  CPAP download    ASSESSMENT:    1. OSA (obstructive sleep apnea)   2. Hypertension, essential   3. Morbid obesity, unspecified obesity type ([place])      PLAN:  In order of problems listed above:  OSA - the patient is tolerating PAP therapy well without any problems. The PAP download was reviewed today and showed an AHI of 2.1/hr on 8 cm H2O with 100% compliance in using more  than 4 hours nightly.  The patient has been using and benefiting from CPAP use and will continue to benefit from therapy.  HTN - BP controlled on current medical therapy.  Continue CCB Obesity - I have encouraged him to get into a routine exercise program and cut back on carbs and portions. He has started working with a Clinical research associate and has lost 5 lbs.  His aerobic exercise  is limited by his COPD.     Medication Adjustments/Labs and Tests Ordered: Current medicines are reviewed at length with the patient today.  Concerns regarding medicines are outlined above.  Medication changes, Labs and Tests ordered today are listed in the Patient Instructions below.  There are no Patient Instructions on file for this visit.   Signed, Fransico Him, MD  04/23/2016 8:31 AM    [facility] [address], [place], [place]  [address] Phone: [phone]; Fax: [phone]

## 2016-04-23 NOTE — Patient Instructions (Signed)

## 2016-04-29 ENCOUNTER — Encounter: Payer: Self-pay | Admitting: Cardiology

## 2016-04-30 ENCOUNTER — Ambulatory Visit: Payer: No Typology Code available for payment source | Admitting: Cardiology

## 2016-05-10 ENCOUNTER — Other Ambulatory Visit: Payer: Self-pay

## 2016-05-10 MED ORDER — DILTIAZEM HCL ER COATED BEADS 240 MG PO CP24: 240.0000 mg | ORAL_CAPSULE | Freq: Every day | ORAL | Status: DC

## 2016-05-10 NOTE — Telephone Encounter (Signed)
Belva Crome, MD at 08/22/2015 9:56 AM  diltiazem (CARDIZEM CD) 240 MG 24 hr capsuleTake 1 capsule (240 mg total) by mouth daily Patient Instructions     Medication Instructions:  Your physician recommends that you continue on your current medications as directed. Please refer to the Current Medication list given to you today.

## 2016-06-09 ENCOUNTER — Encounter: Payer: Self-pay | Admitting: Interventional Cardiology

## 2016-07-06 ENCOUNTER — Ambulatory Visit (INDEPENDENT_AMBULATORY_CARE_PROVIDER_SITE_OTHER): Payer: 59 | Admitting: Neurology

## 2016-07-06 ENCOUNTER — Encounter: Payer: Self-pay | Admitting: Neurology

## 2016-07-06 VITALS — BP 132/78 | HR 80 | Ht 70.5 in | Wt 312.0 lb

## 2016-07-06 DIAGNOSIS — M5412 Radiculopathy, cervical region: Secondary | ICD-10-CM

## 2016-07-06 DIAGNOSIS — R202 Paresthesia of skin: Secondary | ICD-10-CM | POA: Diagnosis not present

## 2016-07-06 NOTE — Progress Notes (Signed)
Nicholas Foley was seen today in neurologic consultation at the request of WEBB, CAROL D, MD.  The consultation is for the evaluation of hand paresthesias.  Pt states that one week ago today, he woke up with bilateral pinky and ring fingers bilaterally.  The following day it was worse.  He is noting now that the ADQM is sore bilaterally.  He is right hand dominant.  He has no elbow tenderness.  Thinks that he began to have some weakness even in the pointer fingers last week (thursday).  Trouble pushing TV remote buttons but no trouble with grasp.  No trouble with twising off a cap.   He works out with a Science writer a lot of weight.  No neck pain.  No loss of bladder/bowel control.  He has had some intermittent feet numbness in the feet over the year but this is different.  Has been on amiodarone x 2 months.  He is on eliquis for a-fib hx.  No DM or hx of such.     ALLERGIES:  No Known Allergies  CURRENT MEDICATIONS:  Outpatient Encounter Prescriptions as of 07/06/2016  Medication Sig  . acetaminophen (TYLENOL) 650 MG CR tablet Take 650 mg by mouth every 8 (eight) hours as needed for pain.  [name] amiodarone (PACERONE) 100 MG tablet Take 1 tablet (100 mg total) by mouth daily.  [name] apixaban (ELIQUIS) 5 MG TABS tablet Take 1 tablet (5 mg total) by mouth 2 (two) times daily.  . Cholecalciferol (VITAMIN D PO) Take 1 tablet by mouth daily.  [name] CIALIS 20 MG tablet Take 20 mg by mouth daily as needed for erectile dysfunction.   [name] diltiazem (CARDIZEM CD) 240 MG 24 hr capsule Take 1 capsule (240 mg total) by mouth daily.  . fluticasone (FLONASE) 50 MCG/ACT nasal spray Place 1 spray into both nostrils daily.  . Fluticasone-Salmeterol (ADVAIR) 250-50 MCG/DOSE AEPB Inhale 1 puff into the lungs 2 (two) times daily.   . furosemide (LASIX) 40 MG tablet Take 2 tablets (80 mg total) by mouth 2 (two) times daily.  . potassium chloride SA (K-DUR,KLOR-CON) 20 MEQ tablet Take 1 tablet (20 mEq total) by mouth 2  (two) times daily.  [name] zolpidem (AMBIEN CR) 12.5 MG CR tablet Take 1 tablet by mouth at bedtime as needed. For sleep  . [DISCONTINUED] pravastatin (PRAVACHOL) 10 MG tablet Take 10 mg by mouth daily.   . [DISCONTINUED] sildenafil (VIAGRA) 100 MG tablet Take 100 mg by mouth daily as needed for erectile dysfunction.   No facility-administered encounter medications on file as of 07/06/2016.     PAST MEDICAL HISTORY:   Past Medical History:  Diagnosis Date  . Acute diastolic heart failure ([place] [facility]) 08/02/2013   BNP greater than 2000   . Atrial fibrillation with rapid ventricular response ([place]) 08/02/2013  . COPD (chronic obstructive pulmonary disease) ([facility])    Moderate by PFTs with response to bronchodilators, May 2014  . Erectile dysfunction   . Hypertension   . Insomnia 08/02/13  . Left bundle branch block 08/02/2013  . Morbid obesity ([place]) 08/02/2013  . OSA (obstructive sleep apnea)    severe OSA with AHI 74/hr now on CPAP at 8cm H2O  . Snoring 08/02/2013   Suspect sleep apnea     PAST SURGICAL HISTORY:   Past Surgical History:  Procedure Laterality Date  . CARDIOVERSION N/A 08/06/2013   Procedure: CARDIOVERSION;  Surgeon: Lelon Perla, MD;  Location: [facility];  Service: Cardiovascular;  Laterality:  N/A;  spoke with [name]   . CARDIOVERSION N/A 09/03/2013   Procedure: CARDIOVERSION;  Surgeon: [name], MD;  Location: [facility];  Service: Cardiovascular;  Laterality: N/A;  . NASAL SEPTUM SURGERY    . TEE WITHOUT CARDIOVERSION N/A 08/06/2013   Procedure: TRANSESOPHAGEAL ECHOCARDIOGRAM (TEE);  Surgeon: Lelon Perla, MD;  Location: [facility] ENDOSCOPY;  Service: Cardiovascular;  Laterality: N/A;  . VASECTOMY      SOCIAL HISTORY:   Social History   Social History  . Marital status: Married    Spouse name: N/A  . Number of children: N/A  . Years of education: N/A   Occupational History  . real estate     investment   Social History Main Topics  . Smoking status:  Former Smoker    Packs/day: 1.00    Years: 30.00    Types: Cigarettes, Cigars    Quit date: 08/02/2012  . Smokeless tobacco: Never Used  . Alcohol use 0.0 oz/week     Comment: once a week  . Drug use: No  . Sexual activity: Yes   Other Topics Concern  . Not on file   Social History Narrative  . No narrative on file    FAMILY HISTORY:   Family Status  Relation Status  . Mother Deceased  . Father Deceased  . Sister Alive  . Brother Deceased  . Brother Alive  . Sister Alive   healthy    ROS:  A complete 10 system review of systems was obtained and was unremarkable apart from what is mentioned above.  PHYSICAL EXAMINATION:    Pt undressed and placed into examination gown:  VITALS:   Vitals:   07/06/16 0902  BP: 132/78  Pulse: 80  Weight: (!) 312 lb (141.5 kg)  Height: 5' 10.5" (1.791 m)    GEN:  Normal appears male in no acute distress.  Appears stated age. HEENT:  Normocephalic, atraumatic. The mucous membranes are moist. The superficial temporal arteries are without ropiness or tenderness. Cardiovascular: Regular rate and rhythm. Lungs: Clear to auscultation bilaterally.  Some DOE Neck/Heme: There are no carotid bruits noted bilaterally.  NEUROLOGICAL: Orientation:  The patient is alert and oriented x 3.  Fund of knowledge is appropriate.  Recent and remote memory intact.  Attention span and concentration normal.  Repeats and names without difficulty. Cranial nerves: There is good facial symmetry. The pupils are equal round and reactive to light bilaterally. Fundoscopic exam reveals clear disc margins bilaterally. Extraocular muscles are intact and visual fields are full to confrontational testing. Speech is fluent and clear. Soft palate rises symmetrically and there is no tongue deviation. Hearing is intact to conversational tone. Tone: Tone is good throughout. Sensation: Sensation is intact to light touch and pinprick throughout (facial, trunk, extremities).  There is decreased pin over the L pointer, middle and ring finger compared to the right. Reports no asymmetry in light touch or temp but does state that the left pinky feels numb even though no objective findings when tested with light touch, pin, pressure, temp.    Vibration is intact at the bilateral big toe. There is no extinction with double simultaneous stimulation. There is no sensory dermatomal level identified. Coordination:  The patient has no difficulty with RAM's or FNF bilaterally. Motor: There is weakness (3+-4-/5) in the L finger abductors.  There is also slight weakness when he makes the "OK" sign on the L (anternior internosseus nerve) compared to the right.  Otherwise, in all other muscles  tested, including grip, strength was 5/5. DTR's: Deep tendon reflexes are 2+-3/4 at the bilateral biceps, triceps, brachioradialis, patella and achilles.  Plantar responses are downgoing bilaterally. Gait and Station: The patient is able to ambulate without difficulty. The patient is able to heel toe walk without any difficulty. The patient is able to ambulate in a tandem fashion. The patient is able to stand in the Romberg position.   IMPRESSION/PLAN  1. Cervical radiculopathy, possibly C7-8  -pt will have an MRI of the cervical spine  -it is too early for him to undergo EMG, so will schedule 4 weeks from now  -pt will call me should new sx's develop  -f/u will depend on results of the above 2.  Much greater than 50% of this visit was spent in counseling/coordinating care.  Total face to face time:  60 min

## 2016-07-06 NOTE — Patient Instructions (Signed)
1. We have sent a referral to [facility] for your MRI and they will call you directly to schedule your appt. They are located at [address] If you need to contact them directly please call [phone].   

## 2016-07-15 ENCOUNTER — Ambulatory Visit
Admission: RE | Admit: 2016-07-15 | Discharge: 2016-07-15 | Disposition: A | Discharge status: Home / Self Care | Payer: 59 | Source: Ambulatory Visit | Attending: Neurology | Admitting: Neurology

## 2016-07-15 ENCOUNTER — Telehealth: Payer: Self-pay | Admitting: Neurology

## 2016-07-15 DIAGNOSIS — R202 Paresthesia of skin: Secondary | ICD-10-CM

## 2016-07-15 DIAGNOSIS — M5412 Radiculopathy, cervical region: Secondary | ICD-10-CM

## 2016-07-15 NOTE — Telephone Encounter (Signed)
-----   Message from [place], DO sent at 07/15/2016 11:17 AM EDT ----- Let pt know that cervical spine looked pretty good.  Little bit of nerve root irritation on the L at c5-6.  Would proceed with scheduled EMG and see how he does.

## 2016-07-15 NOTE — Telephone Encounter (Signed)
Patient made aware.

## 2016-07-22 ENCOUNTER — Ambulatory Visit (INDEPENDENT_AMBULATORY_CARE_PROVIDER_SITE_OTHER): Payer: 59 | Admitting: Neurology

## 2016-07-22 DIAGNOSIS — G608 Other hereditary and idiopathic neuropathies: Secondary | ICD-10-CM

## 2016-07-22 DIAGNOSIS — R202 Paresthesia of skin: Secondary | ICD-10-CM | POA: Diagnosis not present

## 2016-07-22 NOTE — Procedures (Signed)
[facility]  [address], [address]  [place], [place] [address] Tel: [phone] Fax:  [phone] Test Date:  07/22/2016  Patient: Nicholas Foley DOB: October 05, 1951 Physician: Narda Amber, DO  Sex: Male Height: 5\' 10"  Ref Phys: Alonza Bogus, M.D.  ID#: [id number] Temp: 33.3C Technician: Jerilynn Mages. Dean   Patient Complaints: This is a 65 year old gentleman referred for evaluation of four-week history of numbness and tingling involving the fourth and fifth digits bilaterally.  NCV & EMG Findings: Extensive electrodiagnostic testing of the right upper extremity and additional studies of the left shows: 1. Bilateral median and ulnar sensory responses show prolonged latency and normal amplitudes, except the right median sensory response which is reduced (7.2 V).  Bilateral radial sensory responses are within normal limits. 2. Bilateral median motor responses show prolonged latency (R4.6, L4.3 ms) and normal amplitude and conduction velocity.  Bilateral ulnar motor responses show reduced amplitudes (R5.6, L2.9 mV) with normal latency and conduction velocity.  There is no evidence of temporal dispersion or conduction block. 3. Needle electrode examination shows rapid recruitment pattern involving bilateral first dorsal interosseous, abductor digiti minimi, and right abductor pollicis brevis muscles. Positive sharp waves were seen in the left first dorsal interosseous and abductor digit minimi minimi muscles. Cervical paraspinal muscles were not tested as patient is on anticoagulation.  Impression: The electrophysiologic studies are most consistent with a subacute, symmetric sensorimotor polyneuropathy, demyelinating and axon loss in type, affecting the upper extremities. Overall, these findings are moderate in degree electrically.    Mononeuritis multiplex is considered and additional electrodiagnostic testing of the lower extremities may be indicated. Clinical correlation  recommended.   ___________________________ Narda Amber, DO    Nerve Conduction Studies Anti Sensory Summary Table   Stim Site NR Peak (ms) Norm Peak (ms) P-T Amp (V) Norm P-T Amp  Left Median Anti Sensory (2nd Digit)  33.3C  Wrist    4.4 <3.8 12.0 >10  Right Median Anti Sensory (2nd Digit)  33.3C  Wrist    5.0 <3.8 7.2 >10  Left Radial Anti Sensory (Base 1st Digit)  33.3C  Wrist    2.3 <2.8 20.4 >10  Right Radial Anti Sensory (Base 1st Digit)  33.3C  Wrist    2.2 <2.8 19.2 >10  Left Ulnar Anti Sensory (5th Digit)  33.3C  Wrist    3.8 <3.2 8.3 >5  Right Ulnar Anti Sensory (5th Digit)  33.3C  Wrist    3.8 <3.2 7.9 >5   Motor Summary Table   Stim Site NR Onset (ms) Norm Onset (ms) O-P Amp (mV) Norm O-P Amp Site1 Site2 Delta-0 (ms) Dist (cm) Vel (m/s) Norm Vel (m/s)  Left Median Motor (Abd Poll Brev)  Wrist    4.3 <4.0 8.2 >5 Elbow Wrist 3.9 23.0 59 >50  Elbow    8.2  8.1         Right Median Motor (Abd Poll Brev)  Wrist    4.6 <4.0 8.1 >5 Elbow Wrist 4.3 24.0 56 >50  Elbow    8.9  7.4         Left Ulnar Motor (Abd Dig Minimi)  Wrist    3.1 <3.1 2.9 >7 B Elbow Wrist 4.2 23.0 55 >50  B Elbow    7.3  2.6  A Elbow B Elbow 1.8 10.0 56 >50  A Elbow    9.1  2.5  Axilla A Elbow 4.7 0.0    Right Ulnar Motor (Abd Dig Minimi)  Wrist    2.9 <  3.1 5.6 >7 B Elbow Wrist 3.4 21.0 62 >50  B Elbow    6.3  5.2  A Elbow B Elbow 1.5 10.0 67 >50  A Elbow    7.8  4.7         Left Ulnar (FDI) Motor (1st DI)  Wrist    4.2 <4.5 5.0 >7 B Elbow Wrist 4.1 23.0 56 >50  B Elbow    8.3  4.3  A Elbow B Elbow 1.7 10.0 59 >50  A Elbow    10.0  3.9          EMG   Side Muscle Ins Act Fibs Psw Fasc Number Recrt Dur Dur. Amp Amp. Poly Poly. Comment  Right Biceps Nml Nml Nml Nml Nml Nml Nml Nml Nml Nml Nml Nml N/A  Right 1stDorInt Nml Nml Nml Nml 1- Rapid Nml Nml Nml Nml Nml Nml N/A  Right Ext Indicis Nml Nml Nml Nml Nml Nml Nml Nml Nml Nml Nml Nml N/A  Right PronatorTeres Nml Nml Nml Nml Nml Nml  Nml Nml Nml Nml Nml Nml N/A  Right FlexDigProf 4,5 Nml Nml Nml Nml Nml Nml Nml Nml Nml Nml Nml Nml N/A  Right Triceps Nml Nml Nml Nml Nml Nml Nml Nml Nml Nml Nml Nml N/A  Right Deltoid Nml Nml Nml Nml Nml Nml Nml Nml Nml Nml Nml Nml N/A  Right ABD Dig Min Nml Nml Nml Nml 1- Rapid Nml Nml Nml Nml Nml Nml N/A  Right Abd Poll Brev Nml Nml Nml Nml 1- Rapid Nml Nml Nml Nml Nml Nml N/A  Left FlexDigProf 4,5 Nml Nml Nml Nml 1- Mod-R Nml Nml Nml Nml Nml Nml N/A  Left 1stDorInt Nml Nml 1+ Nml 1- Mod-R Nml Nml Nml Nml Nml Nml N/A  Left Abd Poll Brev Nml Nml Nml Nml Nml Nml Nml Nml Nml Nml Nml Nml N/A  Left Ext Indicis Nml Nml Nml Nml Nml Nml Nml Nml Nml Nml Nml Nml N/A  Left PronatorTeres Nml Nml Nml Nml Nml Nml Nml Nml Nml Nml Nml Nml N/A  Left Biceps Nml Nml Nml Nml Nml Nml Nml Nml Nml Nml Nml Nml N/A  Left Triceps Nml Nml Nml Nml Nml Nml Nml Nml Nml Nml Nml Nml N/A  Left Deltoid Nml Nml Nml Nml Nml Nml Nml Nml Nml Nml Nml Nml N/A  Left ABD Dig Min Nml Nml 1+ Nml 1- Mod-R Nml Nml Nml Nml Nml Nml N/A      Waveforms:

## 2016-07-23 ENCOUNTER — Telehealth: Payer: Self-pay | Admitting: Neurology

## 2016-07-23 NOTE — Telephone Encounter (Signed)
-----   Message from Clayton, DO sent at 07/23/2016  9:55 AM EDT ----- See if patient can come next week sometime to discuss

## 2016-07-23 NOTE — Telephone Encounter (Signed)
Patient can not come in next week.   Appt made for 08/06/16 with patient around his schedule.

## 2016-07-23 NOTE — Telephone Encounter (Signed)
Left message on machine for patient to call back.

## 2016-07-29 ENCOUNTER — Encounter: Payer: 59 | Admitting: Neurology

## 2016-08-04 NOTE — Progress Notes (Signed)
Nicholas Foley was seen today in neurologic consultation at the request of WEBB, CAROL D, MD.  The consultation is for the evaluation of hand paresthesias.  Pt states that one week ago today, he woke up with bilateral pinky and ring fingers bilaterally.  The following day it was worse.  He is noting now that the ADQM is sore bilaterally.  He is right hand dominant.  He has no elbow tenderness.  Thinks that he began to have some weakness even in the pointer fingers last week (thursday).  Trouble pushing TV remote buttons but no trouble with grasp.  No trouble with twising off a cap.   He works out with a Science writer a lot of weight.  No neck pain.  No loss of bladder/bowel control.  He has had some intermittent feet numbness in the feet over the year but this is different.  Has been on amiodarone x 2 months.  He is on eliquis for a-fib hx.  No DM or hx of such.    08/04/16 update:  The patient follows up today.  I had wanted to see him earlier than this, but his schedule was unable to accommodate.  He had an EMG done by Dr. Posey Pronto on 07/22/2016.  She had concern for possible mononeuritis multiplex.  MRI of the cervical spine was done since last visit as well and reviewed by me.  There was some mild disc bulging, but nothing to explain his symptoms.  He still has the same sx's but they are not worse.  Is working out with a Clinical research associate at [employer].     ALLERGIES:  No Known Allergies  CURRENT MEDICATIONS:  Outpatient Encounter Prescriptions as of 08/06/2016  Medication Sig  . acetaminophen (TYLENOL) 650 MG CR tablet Take 650 mg by mouth every 8 (eight) hours as needed for pain.  [name] amiodarone (PACERONE) 100 MG tablet Take 1 tablet (100 mg total) by mouth daily.  [name] apixaban (ELIQUIS) 5 MG TABS tablet Take 1 tablet (5 mg total) by mouth 2 (two) times daily.  . Cholecalciferol (VITAMIN D PO) Take 1 tablet by mouth daily.  [name] CIALIS 20 MG tablet Take 20 mg by mouth daily as needed for erectile  dysfunction.   [name] diltiazem (CARDIZEM CD) 240 MG 24 hr capsule Take 1 capsule (240 mg total) by mouth daily.  . fluticasone (FLONASE) 50 MCG/ACT nasal spray Place 1 spray into both nostrils daily.  . Fluticasone-Salmeterol (ADVAIR) 250-50 MCG/DOSE AEPB Inhale 1 puff into the lungs 2 (two) times daily.   . furosemide (LASIX) 40 MG tablet Take 2 tablets (80 mg total) by mouth 2 (two) times daily.  . potassium chloride SA (K-DUR,KLOR-CON) 20 MEQ tablet Take 1 tablet (20 mEq total) by mouth 2 (two) times daily.  [name] zolpidem (AMBIEN CR) 12.5 MG CR tablet Take 1 tablet by mouth at bedtime as needed. For sleep   No facility-administered encounter medications on file as of 08/06/2016.     PAST MEDICAL HISTORY:   Past Medical History:  Diagnosis Date  . Acute diastolic heart failure ([place]) 08/02/2013   BNP greater than 2000   . Atrial fibrillation with rapid ventricular response ([place]) 08/02/2013  . COPD (chronic obstructive pulmonary disease) ([facility])    Moderate by PFTs with response to bronchodilators, May 2014  . Erectile dysfunction   . Hypertension   . Insomnia 08/02/13  . Left bundle branch block 08/02/2013  . Morbid obesity ([place]) 08/02/2013  . OSA (obstructive  sleep apnea)    severe OSA with AHI 74/hr now on CPAP at 8cm H2O  . Snoring 08/02/2013   Suspect sleep apnea     PAST SURGICAL HISTORY:   Past Surgical History:  Procedure Laterality Date  . CARDIOVERSION N/A 08/06/2013   Procedure: CARDIOVERSION;  Surgeon: Lelon Perla, MD;  Location: [facility] ENDOSCOPY;  Service: Cardiovascular;  Laterality: N/A;  spoke with [name]   . CARDIOVERSION N/A 09/03/2013   Procedure: CARDIOVERSION;  Surgeon: [name], MD;  Location: [facility];  Service: Cardiovascular;  Laterality: N/A;  . NASAL SEPTUM SURGERY    . TEE WITHOUT CARDIOVERSION N/A 08/06/2013   Procedure: TRANSESOPHAGEAL ECHOCARDIOGRAM (TEE);  Surgeon: Lelon Perla, MD;  Location: [facility] ENDOSCOPY;  Service: Cardiovascular;  Laterality:  N/A;  . VASECTOMY      SOCIAL HISTORY:   Social History   Social History  . Marital status: Married    Spouse name: N/A  . Number of children: N/A  . Years of education: N/A   Occupational History  . real estate     investment   Social History Main Topics  . Smoking status: Former Smoker    Packs/day: 1.00    Years: 30.00    Types: Cigarettes, Cigars    Quit date: 08/02/2012  . Smokeless tobacco: Never Used  . Alcohol use 0.0 oz/week     Comment: once a week  . Drug use: No  . Sexual activity: Yes   Other Topics Concern  . Not on file   Social History Narrative  . No narrative on file    FAMILY HISTORY:   Family Status  Relation Status  . Mother Deceased  . Father Deceased  . Sister Alive  . Brother Deceased  . Brother Alive  . Sister Alive   healthy    ROS:  A complete 10 system review of systems was obtained and was unremarkable apart from what is mentioned above.  PHYSICAL EXAMINATION:    Pt undressed and placed into examination gown:  VITALS:   Vitals:   08/06/16 1431  BP: 134/80  Pulse: 82  Weight: (!) 311 lb (141.1 kg)  Height: 5' 10.5" (1.791 m)    GEN:  Normal appears male in no acute distress.  Appears stated age. HEENT:  Normocephalic, atraumatic. The mucous membranes are moist. The superficial temporal arteries are without ropiness or tenderness. Cardiovascular: Regular rate and rhythm. Lungs: Clear to auscultation bilaterally.  Some DOE Neck/Heme: There are no carotid bruits noted bilaterally.  NEUROLOGICAL: Orientation:  The patient is alert and oriented x 3.   Virtually 100% of visit in counseling today  Labs: The patient had lab work through his primary care physician on 03/26/2016.  I reviewed this.  His sodium was 140, potassium 4.0, chloride 101, CO2 29, BUN 22, creatinine 1.23 (this is actually slightly improved).  His AST was 17 and ALT 26.  Total cholesterol was 210 with an LDL of 117.   IMPRESSION/PLAN  1.   Mononeuritis multiplex  -pt will have an EMG of the lower extremities to confirm the diagnosis.  -He will have lab work to include sedimentation rate, CRP, TSH, B12, B1, copper, SSA/SSB, ANA, hemoglobin A1c, SPEP and UPEP with immunofixation.  -Depending on the results of the above, the patient may need a lumbar puncture.  -I will refer the patient to my partner, Dr. Posey Pronto, for continuing care, given that this is her field of expertise.

## 2016-08-06 ENCOUNTER — Encounter: Payer: Self-pay | Admitting: Neurology

## 2016-08-06 ENCOUNTER — Ambulatory Visit (INDEPENDENT_AMBULATORY_CARE_PROVIDER_SITE_OTHER): Payer: 59 | Admitting: Neurology

## 2016-08-06 VITALS — BP 134/80 | HR 82 | Ht 70.5 in | Wt 311.0 lb

## 2016-08-06 DIAGNOSIS — R739 Hyperglycemia, unspecified: Secondary | ICD-10-CM

## 2016-08-06 DIAGNOSIS — G587 Mononeuritis multiplex: Secondary | ICD-10-CM | POA: Diagnosis not present

## 2016-08-06 DIAGNOSIS — G609 Hereditary and idiopathic neuropathy, unspecified: Secondary | ICD-10-CM | POA: Diagnosis not present

## 2016-08-06 LAB — VITAMIN B12: Vitamin B-12: 327 pg/mL (ref 200–1100)

## 2016-08-06 LAB — TSH: TSH: 1.93 mIU/L (ref 0.40–4.50)

## 2016-08-07 LAB — HEMOGLOBIN A1C
Hgb A1c MFr Bld: 5.3 % (ref <5.7)
Mean Plasma Glucose: 105 mg/dL

## 2016-08-07 LAB — SEDIMENTATION RATE: Sed Rate: 10 mm/hr (ref 0–20)

## 2016-08-07 LAB — C-REACTIVE PROTEIN: CRP: 4.2 mg/L (ref <8.0)

## 2016-08-09 LAB — SJOGREN'S SYNDROME ANTIBODS(SSA + SSB)
SSA (Ro) (ENA) Antibody, IgG: <1
SSB (LA) (ENA) ANTIBODY, IGG: NEGATIVE

## 2016-08-09 LAB — ANA: Anti Nuclear Antibody(ANA): NEGATIVE

## 2016-08-10 LAB — IMMUNOFIXATION ELECTROPHORESIS
IGG (IMMUNOGLOBIN G), SERUM: 955 mg/dL (ref 694–1618)
IGM, SERUM: 139 mg/dL (ref 48–271)
IgA: 446 mg/dL (ref 81–463)

## 2016-08-10 LAB — PROTEIN ELECTROPHORESIS, SERUM
Albumin ELP: 3.7 g/dL — ABNORMAL LOW (ref 3.8–4.8)
Alpha-1-Globulin: 0.5 g/dL — ABNORMAL HIGH (ref 0.2–0.3)
Alpha-2-Globulin: 0.7 g/dL (ref 0.5–0.9)
BETA 2: 0.5 g/dL (ref 0.2–0.5)
BETA GLOBULIN: 0.5 g/dL (ref 0.4–0.6)
GAMMA GLOBULIN: 0.9 g/dL (ref 0.8–1.7)
Total Protein, Serum Electrophoresis: 6.7 g/dL (ref 6.1–8.1)

## 2016-08-10 LAB — PROTEIN ELECTROPHORESIS,RANDOM URN
ALBUMIN UR 24 HR ELECTRO: 47.3 %
ALPHA-1-GLOBULIN, U: 25.5 %
ALPHA-2-GLOBULIN, U: 13.4 %
BETA GLOBULIN, U: 9.9 %
Creatinine, Urine: 127 mg/dL (ref 20–370)
GAMMA GLOBULIN, U: 3.9 %
PROTEIN CREATININE RATIO: 205 mg/g{creat} — AB (ref 22–128)
Total Protein, Urine: 26 mg/dL — ABNORMAL HIGH (ref 5–25)

## 2016-08-10 LAB — VITAMIN B1: VITAMIN B1 (THIAMINE): 10 nmol/L (ref 8–30)

## 2016-08-10 LAB — IMMUNOFIXATION INTE

## 2016-08-12 ENCOUNTER — Ambulatory Visit (INDEPENDENT_AMBULATORY_CARE_PROVIDER_SITE_OTHER): Payer: 59 | Admitting: Neurology

## 2016-08-12 DIAGNOSIS — G587 Mononeuritis multiplex: Secondary | ICD-10-CM

## 2016-08-12 DIAGNOSIS — G609 Hereditary and idiopathic neuropathy, unspecified: Secondary | ICD-10-CM

## 2016-08-12 NOTE — Procedures (Signed)
[facility]  [address], [address]  [place], [place] [address] Tel: [phone] Fax:  [phone] Test Date:  08/12/2016  Patient: Nicholas Foley DOB: 1951-09-27 Physician: Narda Amber, DO  Sex: Male Height: 5\' 11"  Ref Phys: Narda Amber, DO  ID#: [id number] Temp: 33.1C Technician: Jerilynn Mages. Dean   Patient Complaints: This is a 65 year old gentleman recently diagnosed with subacute sensorimotor polyneuropathy in the upper extremities and has been referred for evaluation for neuropathy of the legs.  NCV & EMG Findings: Extensive electrodiagnostic testing of the left lower extremity and additional studies of the right shows: 1. All sensory responses including bilateral sural and superficial peroneal nerves are within normal limits. 2. All motor responses including bilateral tibial and peroneal nerves are within normal limits. 3. Bilateral tibial H and F wave studies are within normal limits. 4. There is no evidence of active or chronic motor axon loss changes affecting any of the tested muscles. Motor unit configuration and recruitment pattern is within normal limits.  Impression: This is a normal study of the lower extremities. In particular, there is no evidence of a sensorimotor polyneuropathy or lumbosacral radiculopathy.   ___________________________ Narda Amber, DO    Nerve Conduction Studies Anti Sensory Summary Table   Stim Site NR Peak (ms) Norm Peak (ms) P-T Amp (V) Norm P-T Amp  Left Sup Peroneal Anti Sensory (Ant Lat Mall)  33.1C  12 cm    2.8 <4.6 6.8 >3  Right Sup Peroneal Anti Sensory (Ant Lat Mall)  33.1C  12 cm    2.9 <4.6 5.8 >3  Left Sural Anti Sensory (Lat Mall)  33.1C  Calf    3.6 <4.6 7.9 >3  Right Sural Anti Sensory (Lat Mall)  33.1C  Calf    3.3 <4.6 8.8 >3   Motor Summary Table   Stim Site NR Onset (ms) Norm Onset (ms) O-P Amp (mV) Norm O-P Amp Site1 Site2 Delta-0 (ms) Dist (cm) Vel (m/s) Norm Vel (m/s)  Left Peroneal Motor (Ext Dig  Brev)  33.1C  Ankle    3.0 <6.0 5.1 >2.5 B Fib Ankle 7.9 33.0 42 >40  B Fib    10.9  4.1  Poplt B Fib 1.9 10.0 53 >40  Poplt    12.8  4.1         Right Peroneal Motor (Ext Dig Brev)  33.1C  Ankle    3.9 <6.0 7.7 >2.5 B Fib Ankle 6.8 33.0 49 >40  B Fib    10.7  7.7  Poplt B Fib 2.0 10.0 50 >40  Poplt    12.7  7.3         Left Tibial Motor (Abd Hall Brev)  33.1C  Ankle    4.1 <6.0 8.8 >4 Knee Ankle 8.7 38.0 44 >40  Knee    12.8  6.9         Right Tibial Motor (Abd Hall Brev)  33.1C  Ankle    4.8 <6.0 9.5 >4 Knee Ankle 7.6 38.0 50 >40  Knee    12.4  9.3          F Wave Studies   NR F-Lat (ms) Lat Norm (ms) L-R F-Lat (ms) M-Lat (ms) FLat-MLat (ms)  Left Tibial (Mrkrs) (Abd Hallucis)  33.1C     51.08 <55 1.39 4.16 46.92  Right Tibial (Mrkrs) (Abd Hallucis)  33.1C     52.47 <55 1.39 4.16 48.31   H Reflex Studies   NR H-Lat (ms) Lat Norm (ms) L-R  H-Lat (ms) M-Lat (ms) HLat-MLat (ms)  Left Tibial (Gastroc)  33.1C     34.15 <35 0.54 4.22 29.93  Right Tibial (Gastroc)  33.1C     34.69 <35 0.54 4.22 30.47   EMG   Side Muscle Ins Act Fibs Psw Fasc Number Recrt Dur Dur. Amp Amp. Poly Poly. Comment  Right AntTibialis Nml Nml Nml Nml Nml Nml Nml Nml Nml Nml Nml Nml N/A  Right Gastroc Nml Nml Nml Nml Nml Nml Nml Nml Nml Nml Nml Nml N/A  Right Flex Dig Long Nml Nml Nml Nml Nml Nml Nml Nml Nml Nml Nml Nml N/A  Right RectFemoris Nml Nml Nml Nml Nml Nml Nml Nml Nml Nml Nml Nml N/A  Right GluteusMed Nml Nml Nml Nml Nml Nml Nml Nml Nml Nml Nml Nml N/A  Left AntTibialis Nml Nml Nml Nml Nml Nml Nml Nml Nml Nml Nml Nml N/A  Left Gastroc Nml Nml Nml Nml Nml Nml Nml Nml Nml Nml Nml Nml N/A  Left Flex Dig Long Nml Nml Nml Nml Nml Nml Nml Nml Nml Nml Nml Nml N/A  Left RectFemoris Nml Nml Nml Nml Nml Nml Nml Nml Nml Nml Nml Nml N/A  Left GluteusMed Nml Nml Nml Nml Nml Nml Nml Nml Nml Nml Nml Nml N/A  Left BicepsFemS Nml Nml Nml Nml Nml Nml Nml Nml Nml Nml Nml Nml N/A  Right BicepsFemS Nml Nml  Nml Nml Nml Nml Nml Nml Nml Nml Nml Nml N/A      Waveforms:

## 2016-08-13 ENCOUNTER — Telehealth: Payer: Self-pay | Admitting: Neurology

## 2016-08-13 LAB — COPPER, FREE: COPPER - FREE, SERUM/PLASMA: 340 ug/L

## 2016-08-13 NOTE — Telephone Encounter (Signed)
Patient made aware.

## 2016-08-13 NOTE — Telephone Encounter (Signed)
-----   Message from [place], DO sent at 08/13/2016 10:42 AM EDT ----- Let pt know labs okay.  B12 just little low.  Add oral B12 supplement 1065mcg daily

## 2016-08-13 NOTE — Telephone Encounter (Signed)
Left message on machine for patient to call back.

## 2016-08-16 ENCOUNTER — Other Ambulatory Visit: Payer: Self-pay | Admitting: Interventional Cardiology

## 2016-08-16 DIAGNOSIS — I5031 Acute diastolic (congestive) heart failure: Secondary | ICD-10-CM

## 2016-08-19 ENCOUNTER — Ambulatory Visit (INDEPENDENT_AMBULATORY_CARE_PROVIDER_SITE_OTHER): Payer: 59 | Admitting: Neurology

## 2016-08-19 ENCOUNTER — Encounter: Payer: Self-pay | Admitting: Neurology

## 2016-08-19 ENCOUNTER — Other Ambulatory Visit (INDEPENDENT_AMBULATORY_CARE_PROVIDER_SITE_OTHER): Payer: 59

## 2016-08-19 ENCOUNTER — Other Ambulatory Visit: Payer: Self-pay | Admitting: Neurology

## 2016-08-19 VITALS — BP 120/80 | HR 86 | Ht 70.5 in | Wt 310.1 lb

## 2016-08-19 DIAGNOSIS — G608 Other hereditary and idiopathic neuropathies: Secondary | ICD-10-CM | POA: Diagnosis not present

## 2016-08-19 LAB — FOLATE: FOLATE: 16 ng/mL (ref >5.9)

## 2016-08-19 NOTE — Patient Instructions (Addendum)
1.  Check blood work 2.  If you develop any worsening symptoms, call my office and the next step will be lumbar puncture 3.  Return to clinic on November 22 at 11:30am.  Please arrive 10-minutes.

## 2016-08-19 NOTE — Progress Notes (Signed)
[facility] Clinic Note - Initial Visit   Date: 08/19/16  Nicholas Foley MRN: [id number] DOB: [birth year]   Dear Dr Justin Mend:  Thank you for your kind referral of Nicholas Foley for consultation of bilateral hand paresthesias. Although his history is well known to you, please allow [place] to reiterate it for the purpose of our medical record. The patient was accompanied to the clinic by self.    History of Present Illness: Nicholas Foley is a 65 y.o. right-handed Caucasian male with OSA on CPAP, hypertension, atrial fibrillation, COPD,  presenting for evaluation of bilateral hand paresthesias.[name]    He was in [place] in early August and when he woke up, he noticed both 4th and 5th digits were numb and tingling.  The numbness has been constant since onset.  He also notices mild weakness of the hand, especially involving the last two fingers. He recalls that initially his last 2 fingers were much weaker because his finger would fold up, such as when he was washing his hands. This has significantly improved over the past several weeks. He did not have any severe pain of the hands when symptoms started. He currently reports mild numbness and tingling over the fourth and fifth digits, which is not anywhere as severe as it previously was.  He denies any weakness of the hands, in fact, he has been going to the gym and has not noticed any new difficulty handling hand weights. He denies any numbness/tinging of the finger three fingers, including thumb, or neck pain.  He has not been on any new medications.    He initially was evaluated by my colleague, Dr. Carles Collet, on 07/06/2016. At that time, electrodiagnostic testing of the upper extremities was ordered which showed bilateral median and ulnar neuropathies with predominately prolonged latency and reduced ulnar motor amplitude. Because of the demyelinating features on his exam and multiple nerve involvement, most mononeuritis multiplex as  well as polyradiculoneuropathy was considered. He has had extensive laboratory testing (see below) which does not show evidence of inflammation, vitamin deficiencies, or paraproteinemias.  Further, electrodiagnostic testing of the lower extremities was normal.  Out-side paper records, electronic medical record, and images have been reviewed where available and summarized as:  NCS/EMG of the upper extremities 07/22/2016:  The electrophysiologic studies are most consistent with a subacute, symmetric sensorimotor polyneuropathy, demyelinating and axon loss in type, affecting the upper extremities. Overall, these findings are moderate in degree electrically.   Mononeuritis multiplex is considered and additional electrodiagnostic testing of the lower extremities may be indicated. Clinical correlation recommended.  NCS/EMG of the legs 08/12/2016:  normal  Labs 08/06/2016:  ESR 10, CRP 4.2, ANA neg, SSA/B neg, SPEP/UPEP with IFE no M protein, TSH 1.93, vitamin B12 327, vitamin B1 10, copper 340   Past Medical History:  Diagnosis Date  . Acute diastolic heart failure ([employer]) 08/02/2013   BNP greater than 2000   . Atrial fibrillation with rapid ventricular response ([place]) 08/02/2013  . COPD (chronic obstructive pulmonary disease) (HCC)    Moderate by PFTs with response to bronchodilators, May 2014  . Erectile dysfunction   . Hypertension   . Insomnia 08/02/13  . Left bundle branch block 08/02/2013  . Morbid obesity ([place]) 08/02/2013  . OSA (obstructive sleep apnea)    severe OSA with AHI 74/hr now on CPAP at 8cm H2O  . Snoring 08/02/2013   Suspect sleep apnea     Past Surgical History:  Procedure Laterality Date  . CARDIOVERSION  N/A 08/06/2013   Procedure: CARDIOVERSION;  Surgeon: Lelon Perla, MD;  Location[facility] ENDOSCOPY;  Service: Cardiovascular;  Laterality: N/A;  spoke with [name]   . CARDIOVERSION N/A 09/03/2013   Procedure: CARDIOVERSION;  Surgeon: [name], MD;  Location: [facility];  Service: Cardiovascular;  Laterality: N/A;  . NASAL SEPTUM SURGERY    . TEE WITHOUT CARDIOVERSION N/A 08/06/2013   Procedure: TRANSESOPHAGEAL ECHOCARDIOGRAM (TEE);  Surgeon: Lelon Perla, MD;  Location[facility] ENDOSCOPY;  Service: Cardiovascular;  Laterality: N/A;  . VASECTOMY       Medications:  Outpatient Encounter Prescriptions as of 08/19/2016  Medication Sig Note  . acetaminophen (TYLENOL) 650 MG CR tablet Take 650 mg by mouth every 8 (eight) hours as needed for pain.   [name] amiodarone (PACERONE) 100 MG tablet Take 1 tablet (100 mg total) by mouth daily.   [name] apixaban (ELIQUIS) 5 MG TABS tablet Take 1 tablet (5 mg total) by mouth 2 (two) times daily.   . Cholecalciferol (VITAMIN D PO) Take 1 tablet by mouth daily.   [name] CIALIS 20 MG tablet Take 20 mg by mouth daily as needed for erectile dysfunction.  01/03/2015: Received from: External Pharmacy  . diltiazem (CARDIZEM CD) 240 MG 24 hr capsule Take 1 capsule (240 mg total) by mouth daily.   [name] FLUARIX QUADRIVALENT 0.5 ML injection inject 0.5 milliliter intramuscularly 08/19/2016: Received from: External Pharmacy  . fluticasone (FLONASE) 50 MCG/ACT nasal spray Place 1 spray into both nostrils daily. 04/25/2015: Received from: External Pharmacy Received Sig:   . Fluticasone-Salmeterol (ADVAIR) 250-50 MCG/DOSE AEPB Inhale 1 puff into the lungs 2 (two) times daily.    . furosemide (LASIX) 40 MG tablet Take 2 tablets (80 mg total) by mouth 2 (two) times daily.   . potassium chloride SA (K-DUR,KLOR-CON) 20 MEQ tablet Take 1 tablet (20 mEq total) by mouth 2 (two) times daily.   [name] zolpidem (AMBIEN CR) 12.5 MG CR tablet Take 1 tablet by mouth at bedtime as needed. For sleep    No facility-administered encounter medications on file as of 08/19/2016.      Allergies: No Known Allergies  Family History: Family History  Problem Relation Age of Onset  . Atrial fibrillation Mother   . COPD Father   . Cerebral aneurysm Brother   . Healthy  Daughter     Social History: Social History  Substance Use Topics  . Smoking status: Former Smoker    Packs/day: 1.00    Years: 30.00    Types: Cigarettes, Cigars    Quit date: 08/02/2012  . Smokeless tobacco: Never Used  . Alcohol use 0.0 oz/week     Comment: once a week; previously 3-4 times per week    Social History   Social History Narrative  . No narrative on file    Review of Systems:  CONSTITUTIONAL: No fevers, chills, night sweats, or weight loss.   EYES: No visual changes or eye pain ENT: No hearing changes.  No history of nose bleeds.   RESPIRATORY: No cough, wheezing and shortness of breath.   CARDIOVASCULAR: Negative for chest pain, and palpitations.   GI: Negative for abdominal discomfort, blood in stools or black stools.  No recent change in bowel habits.   GU:  No history of incontinence.   MUSCLOSKELETAL: No history of joint pain or swelling.  No myalgias.   SKIN: Negative for lesions, rash, and itching.   HEMATOLOGY/ONCOLOGY: Negative for prolonged bleeding, bruising easily, and swollen nodes.  No  history of cancer.   ENDOCRINE: Negative for cold or heat intolerance, polydipsia or goiter.   PSYCH:  No depression or anxiety symptoms.   NEURO: As Above.   Vital Signs:  BP 120/80   Pulse 86   Ht 5' 10.5" (1.791 m)   Wt (!) 310 lb 1 oz (140.6 kg)   SpO2 92%   BMI 43.86 kg/m  Pain Scale: 0 on a scale of 0-10   General Medical Exam:   General:  Well appearing, comfortable.   Eyes/ENT: see cranial nerve examination.   Neck: No masses appreciated.  Full range of motion without tenderness.  No carotid bruits. Respiratory:  Clear to auscultation, good air entry bilaterally.   Cardiac:  Regular rate and rhythm, no murmur.   Extremities:  No deformities, edema, or skin discoloration.  Skin:  No rashes or lesions.  Neurological Exam: MENTAL STATUS including orientation to time, place, person, recent and remote memory, attention span and concentration,  language, and fund of knowledge is normal.  Speech is not dysarthric.  CRANIAL NERVES: II:  No visual field defects.  Unremarkable fundi.   III-IV-VI: Pupils equal round and reactive to light.  Normal conjugate, extra-ocular eye movements in all directions of gaze.  No nystagmus.  No ptosis.   V:  Normal facial sensation.    VII:  Normal facial symmetry and movements.  No pathologic facial reflexes.  VIII:  Normal hearing and vestibular function.   IX-X:  Normal palatal movement.   XI:  Normal shoulder shrug and head rotation.   XII:  Normal tongue strength and range of motion, no deviation or fasciculation.  MOTOR:  No atrophy, fasciculations or abnormal movements.  No pronator drift.  Tone is normal.    Right Upper Extremity:    Left Upper Extremity:    Deltoid  5/5   Deltoid  5/5   Biceps  5/5   Biceps  5/5   Triceps  5/5   Triceps  5/5   Wrist extensors  5/5   Wrist extensors  5/5   Wrist flexors  5/5   Wrist flexors  5/5   Finger extensors  5/5   Finger extensors  5/5   Finger flexors  5/5   Finger flexors  5/5   ADM 4+/5   ADM 5-/5  Abductor pollicis  5/5   Abductor pollicis  5/5   Tone (Ashworth scale)  0  Tone (Ashworth scale)  0   Right Lower Extremity:    Left Lower Extremity:    Hip flexors  5/5   Hip flexors  5/5   Hip extensors  5/5   Hip extensors  5/5   Knee flexors  5/5   Knee flexors  5/5   Knee extensors  5/5   Knee extensors  5/5   Dorsiflexors  5/5   Dorsiflexors  5/5   Plantarflexors  5/5   Plantarflexors  5/5   Toe extensors  5/5   Toe extensors  5/5   Toe flexors  5/5   Toe flexors  5/5   Tone (Ashworth scale)  0  Tone (Ashworth scale)  0   MSRs:  Right  Left brachioradialis 2+  brachioradialis 2+  biceps 2+  biceps 2+  triceps 2+  triceps 2+  patellar 2+  patellar 2+  ankle jerk 2+  ankle jerk 2+  Hoffman no  Hoffman no  plantar response down  plantar response down   SENSORY:  Normal  and symmetric perception of light touch, pinprick, vibration, and proprioception.  Romberg's sign absent.   COORDINATION/GAIT: Normal finger-to- nose-finger.  Intact rapid alternating movements bilaterally.  Able to rise from a chair without using arms.  Gait narrow based and stable.    IMPRESSION: Nicholas Foley is a delightful 65 year old gentleman referred for evaluation of bilateral hand paresthesias, worse over the fourth and fifth digits.  By history, he has features suggestive of bilateral ulnar neuropathy; however, his electrodiagnostic testing not only showed bilateral ulnar neuropathies but also bilateral median neuropathy. Because of this multiple nerve involvement, mononeuritis multiplex was considered, however he lacks the typical severe pain which is associated with this and there is no evidence of systemic inflammation.    Other considerations include a polyradiculoneuropathy, especially with demyelinating features. Of note, paraspinal muscles were not tested due to him being on anticoagulation therapy. To further evaluate this, we spinal fluid analysis would be the next step.   After long discussion of the risks and benefits, we decided to hold on CSF testing because (1) he is clinically improving, (2) there has been no new neurological symptoms outside of the initial nerve involvement, (3) there was no evidence of conduction block or temporal dispersion on electrodiagnostic testing. If he develops any new neurological symptoms, I have very low threshold to pursue spinal fluid analysis.  In the meantime I will check a few additional labs, including folate, heavy metal screen, vitamin B6, ceruloplasmin, copper, ACE, zinc, HIV.    Barnetta Chapel has not been on any new medications, however amiodarone can cause demyelinating neuropathy which he has been taking for a number of years.  I do not recommend any changes be made at this time, but going forward this may need to be considered, should his CSF  testing return normal and he continues to progress.  I will follow him closely and see him back in 6 weeks.  The duration of this appointment visit was 50 minutes of face-to-face time with the patient.  Greater than 50% of this time was spent in counseling, explanation of diagnosis, planning of further management, and coordination of care.   Thank you for allowing me to participate in patient's care.  If I can answer any additional questions, I would be pleased to do so.    Sincerely,    Donika K. Posey Pronto, DO

## 2016-08-20 LAB — ANGIOTENSIN CONVERTING ENZYME: Angiotensin-Converting Enzyme: 43 U/L (ref 9–67)

## 2016-08-20 LAB — HIV ANTIBODY (ROUTINE TESTING W REFLEX): HIV: NONREACTIVE

## 2016-08-21 LAB — HEAVY METALS PANEL, BLOOD
Arsenic: <3 mcg/L (ref <23)
LEAD: 2 ug/dL (ref <5)
Mercury, B: <4 mcg/L (ref <=10)

## 2016-08-21 LAB — ZINC: Zinc: 94 ug/dL (ref 60–130)

## 2016-08-21 LAB — COPPER, SERUM: COPPER: 92 ug/dL (ref 72–166)

## 2016-08-23 LAB — VITAMIN B6: Vitamin B6: 14.1 ng/mL (ref 2.1–21.7)

## 2016-08-23 LAB — CERULOPLASMIN: Ceruloplasmin: 27 mg/dL (ref 18–36)

## 2016-09-08 NOTE — Progress Notes (Signed)
Cardiology Office Note    Date:  09/09/2016   ID:  Nicholas Foley, DOB 12/23/1950, MRN [id number]  PCP:  Jonathon Bellows, MD  Cardiologist: [name], MD   Chief Complaint  Patient presents with  . Atrial Fibrillation    History of Present Illness:  Nicholas Foley is a 65 y.o. male who presents for paroxysmal atrial fibrillation, amiodarone therapy, obstructive sleep apnea, hypertension, chronic diastolic heart failure, morbid obesity, and left bundle branch block.  Doing well. 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 Slowly improving. Within the past 3 months liver panel and TSH of been documented normal. He has had no recurrence of atrial fibrillation.   Past Medical History:  Diagnosis Date  . Acute diastolic heart failure ([place]) 08/02/2013   BNP greater than 2000   . Atrial fibrillation with rapid ventricular response ([place]) 08/02/2013  . COPD (chronic obstructive pulmonary disease) (HCC)    Moderate by PFTs with response to bronchodilators, May 2014  . Erectile dysfunction   . Hypertension   . Insomnia 08/02/13  . Left bundle branch block 08/02/2013  . Morbid obesity ([place]) 08/02/2013  . OSA (obstructive sleep apnea)    severe OSA with AHI 74/hr now on CPAP at 8cm H2O  . Snoring 08/02/2013   Suspect sleep apnea     Past Surgical History:  Procedure Laterality Date  . CARDIOVERSION N/A 08/06/2013   Procedure: CARDIOVERSION;  Surgeon: Lelon Perla, MD;  Location: [facility] ENDOSCOPY;  Service: Cardiovascular;  Laterality: N/A;  spoke with [name]   . CARDIOVERSION N/A 09/03/2013   Procedure: CARDIOVERSION;  Surgeon: [name], MD;  Location: [facility];  Service: Cardiovascular;  Laterality: N/A;  . NASAL SEPTUM SURGERY    . TEE WITHOUT CARDIOVERSION N/A 08/06/2013   Procedure: TRANSESOPHAGEAL ECHOCARDIOGRAM (TEE);  Surgeon: Lelon Perla, MD;  Location: [facility] ENDOSCOPY;  Service: Cardiovascular;  Laterality: N/A;  . VASECTOMY      Current  Medications: Outpatient Medications Prior to Visit  Medication Sig Dispense Refill  . acetaminophen (TYLENOL) 650 MG CR tablet Take 650 mg by mouth every 8 (eight) hours as needed for pain.    [name] amiodarone (PACERONE) 100 MG tablet Take 1 tablet (100 mg total) by mouth daily. 30 tablet 1  . apixaban (ELIQUIS) 5 MG TABS tablet Take 1 tablet (5 mg total) by mouth 2 (two) times daily. 180 tablet 0  . Cholecalciferol (VITAMIN D PO) Take 1 tablet by mouth daily.    [name] CIALIS 20 MG tablet Take 20 mg by mouth daily as needed for erectile dysfunction.   0  . diltiazem (CARDIZEM CD) 240 MG 24 hr capsule Take 1 capsule (240 mg total) by mouth daily. 30 capsule 3  . FLUARIX QUADRIVALENT 0.5 ML injection inject 0.5 milliliter intramuscularly  0  . fluticasone (FLONASE) 50 MCG/ACT nasal spray Place 1 spray into both nostrils daily.  0  . Fluticasone-Salmeterol (ADVAIR) 250-50 MCG/DOSE AEPB Inhale 1 puff into the lungs 2 (two) times daily.     . furosemide (LASIX) 40 MG tablet Take 2 tablets (80 mg total) by mouth 2 (two) times daily. 120 tablet 1  . potassium chloride SA (K-DUR,KLOR-CON) 20 MEQ tablet Take 1 tablet (20 mEq total) by mouth 2 (two) times daily. 60 tablet 10  . zolpidem (AMBIEN CR) 12.5 MG CR tablet Take 1 tablet by mouth at bedtime as needed. For sleep     No facility-administered medications prior to visit.  Allergies:   Review of patient's allergies indicates no known allergies.   Social History   Social History  . Marital status: Married    Spouse name: N/A  . Number of children: N/A  . Years of education: N/A   Occupational History  . real estate     investment   Social History Main Topics  . Smoking status: Former Smoker    Packs/day: 1.00    Years: 30.00    Types: Cigarettes, Cigars    Quit date: 08/02/2012  . Smokeless tobacco: Never Used  . Alcohol use 0.0 oz/week     Comment: once a week; previously 3-4 times per week   . Drug use: No  . Sexual activity: Yes    Other Topics Concern  . None   Social History Narrative   Works as a Engineer, structural for apartments   Lives with wife.  They have one grown daughter   Highest level of education:  college     Family History:  The patient's family history includes Atrial fibrillation in his mother; COPD in his father; Cerebral aneurysm in his brother; Healthy in his daughter.   ROS:   Please see the history of present illness.    Chronic shortness of breath, hearing loss, muscle pain. Also mentioning tinnitus with decreased hearing All other systems reviewed and are negative.   PHYSICAL EXAM:   VS:  BP (!) 160/74   Pulse 80   Ht 5\' 11"  (1.803 m)   Wt (!) 311 lb 12.8 oz (141.4 kg)   SpO2 95%   BMI 43.49 kg/m    GEN: Well nourished, well developed, in no acute distress  HEENT: normal  Neck: no JVD, carotid bruits, or masses Cardiac: RRR; no murmurs, rubs, or gallops,no edema  Respiratory:  clear to auscultation bilaterally, normal work of breathing GI: soft, nontender, nondistended, + BS MS: no deformity or atrophy  Skin: warm and dry, no rash Neuro:  Alert and Oriented x 3, Strength and sensation are intact Psych: euthymic mood, full affect  Wt Readings from Last 3 Encounters:  09/09/16 (!) 311 lb 12.8 oz (141.4 kg)  08/19/16 (!) 310 lb 1 oz (140.6 kg)  08/06/16 (!) 311 lb (141.1 kg)      Studies/Labs Reviewed:   EKG:  EKG  Normal sinus rhythm with left axis deviation.  Recent Labs: 08/06/2016: TSH 1.93   Lipid Panel    Component Value Date/Time   CHOL 184 08/03/2013 0640   TRIG 149 08/03/2013 0640   HDL 50 08/03/2013 0640   CHOLHDL 3.7 08/03/2013 0640   VLDL 30 08/03/2013 0640   LDLCALC 104 (H) 08/03/2013 0640    Additional studies/ records that were reviewed today include:  Recent sedimentation rate, liver tests, and TSH were normal.    ASSESSMENT:    1. Paroxysmal a-fib (HCC)   2. Chronic anticoagulation   3. OSA (obstructive sleep apnea)   4. Chronic  diastolic heart failure ([place])   5. Hypertension, essential   6. Left bundle branch block   7. Encounter for monitoring amiodarone therapy   8. On amiodarone therapy      PLAN:  In order of problems listed above:  1. Controlled on very low-dose amiodarone 2. No bleeding complications 3. Wearing C Pap and feels markedly improved. 4. No evidence of volume overload 5. 2 g sodium diet and weight loss is advocated. Repeat blood pressure 145/80 mmHg. 6. Present when in atrial fibrillation with rapid ventricular response. 7.  Consider discontinuation of amiodarone therapy at next visit.    Medication Adjustments/Labs and Tests Ordered: Current medicines are reviewed at length with the patient today.  Concerns regarding medicines are outlined above.  Medication changes, Labs and Tests ordered today are listed in the Patient Instructions below. Patient Instructions  Medication Instructions:  None  Labwork: Your physician recommends that you return for lab work in: 6 months at the same time as your follow up appointment. (Liver, TSH)   Testing/Procedures: A chest x-ray takes a picture of the organs and structures inside the chest, including the heart, lungs, and blood vessels. This test can show several things, including, whether the heart is enlarges; whether fluid is building up in the lungs; and whether pacemaker / defibrillator leads are still in place.  This needs to be completed just prior to your follow up appointment in 6 months with Dr. [name].   Follow-Up: Your physician wants you to follow-up in: 6 months with Dr [name]. You will receive a reminder letter in the mail two months in advance. If you don't receive a letter, please call our office to schedule the follow-up appointment.   Any Other Special Instructions Will Be Listed Below (If Applicable).     If you need a refill on your cardiac medications before your next appointment, please call your pharmacy.       Signed, [name], MD  09/09/2016 2:36 PM    [facility] [address], [place], [place]  [address] Phone: [phone]; Fax: [phone]

## 2016-09-09 ENCOUNTER — Encounter (INDEPENDENT_AMBULATORY_CARE_PROVIDER_SITE_OTHER): Payer: Self-pay

## 2016-09-09 ENCOUNTER — Ambulatory Visit (INDEPENDENT_AMBULATORY_CARE_PROVIDER_SITE_OTHER): Payer: 59 | Admitting: Interventional Cardiology

## 2016-09-09 ENCOUNTER — Encounter: Payer: Self-pay | Admitting: Interventional Cardiology

## 2016-09-09 VITALS — BP 160/74 | HR 80 | Ht 71.0 in | Wt 311.8 lb

## 2016-09-09 DIAGNOSIS — Z79899 Other long term (current) drug therapy: Secondary | ICD-10-CM

## 2016-09-09 DIAGNOSIS — Z5181 Encounter for therapeutic drug level monitoring: Secondary | ICD-10-CM

## 2016-09-09 DIAGNOSIS — I5032 Chronic diastolic (congestive) heart failure: Secondary | ICD-10-CM | POA: Diagnosis not present

## 2016-09-09 DIAGNOSIS — I10 Essential (primary) hypertension: Secondary | ICD-10-CM

## 2016-09-09 DIAGNOSIS — I480 Paroxysmal atrial fibrillation: Secondary | ICD-10-CM

## 2016-09-09 DIAGNOSIS — G4733 Obstructive sleep apnea (adult) (pediatric): Secondary | ICD-10-CM

## 2016-09-09 DIAGNOSIS — I447 Left bundle-branch block, unspecified: Secondary | ICD-10-CM

## 2016-09-09 DIAGNOSIS — Z7901 Long term (current) use of anticoagulants: Secondary | ICD-10-CM

## 2016-09-09 NOTE — Patient Instructions (Signed)
Medication Instructions:  None  Labwork: Your physician recommends that you return for lab work in: 6 months at the same time as your follow up appointment. (Liver, TSH)   Testing/Procedures: A chest x-ray takes a picture of the organs and structures inside the chest, including the heart, lungs, and blood vessels. This test can show several things, including, whether the heart is enlarges; whether fluid is building up in the lungs; and whether pacemaker / defibrillator leads are still in place.  This needs to be completed just prior to your follow up appointment in 6 months with Dr. [name].   Follow-Up: Your physician wants you to follow-up in: 6 months with Dr [name]. You will receive a reminder letter in the mail two months in advance. If you don't receive a letter, please call our office to schedule the follow-up appointment.   Any Other Special Instructions Will Be Listed Below (If Applicable).     If you need a refill on your cardiac medications before your next appointment, please call your pharmacy.

## 2016-09-15 ENCOUNTER — Other Ambulatory Visit: Payer: Self-pay | Admitting: Interventional Cardiology

## 2016-09-15 DIAGNOSIS — I5031 Acute diastolic (congestive) heart failure: Secondary | ICD-10-CM

## 2016-10-13 ENCOUNTER — Other Ambulatory Visit: Payer: Self-pay | Admitting: Interventional Cardiology

## 2016-10-13 ENCOUNTER — Encounter: Payer: Self-pay | Admitting: Neurology

## 2016-10-13 ENCOUNTER — Ambulatory Visit (INDEPENDENT_AMBULATORY_CARE_PROVIDER_SITE_OTHER): Payer: 59 | Admitting: Neurology

## 2016-10-13 VITALS — BP 130/74 | HR 81 | Ht 71.0 in | Wt 313.5 lb

## 2016-10-13 DIAGNOSIS — I5031 Acute diastolic (congestive) heart failure: Secondary | ICD-10-CM

## 2016-10-13 DIAGNOSIS — G608 Other hereditary and idiopathic neuropathies: Secondary | ICD-10-CM

## 2016-10-13 NOTE — Patient Instructions (Signed)
You look great today!  If your numbness/tingling or weakness gets worse, come back and see me.

## 2016-10-13 NOTE — Progress Notes (Signed)
Follow-up Visit   Date: 10/13/16    [name] MRN: [id number] DOB: 10/16/1951   Interim History: Nicholas Foley is a 65 y.o. right-handed Caucasian male with OSA on CPAP, hypertension, atrial fibrillation, COPD returning to the clinic for follow-up of bilateral hand paresthesias.  The patient was accompanied to the clinic by self.  History of present illness: He was in [place] in early August and when he woke up, he noticed both 4th and 5th digits were numb and tingling.  The numbness has been constant since onset.  He also notices mild weakness of the hand, especially involving the last two fingers. He recalls that initially his last 2 fingers were much weaker because his finger would fold up, such as when he was washing his hands. This has significantly improved over the past several weeks. He did not have any severe pain of the hands when symptoms started. He currently reports mild numbness and tingling over the fourth and fifth digits, which is not anywhere as severe as it previously was.  He denies any weakness of the hands, in fact, he has been going to the gym and has not noticed any new difficulty handling hand weights. He denies any numbness/tinging of the finger three fingers, including thumb, or neck pain.  He has not been on any new medications.    He initially was evaluated by my colleague, Dr. Carles Collet, on 07/06/2016. At that time, electrodiagnostic testing of the upper extremities was ordered which showed bilateral median and ulnar neuropathies with predominately prolonged latency and reduced ulnar motor amplitude. Because of the demyelinating features on his exam and multiple nerve involvement, most mononeuritis multiplex as well as polyradiculoneuropathy was considered. He has had extensive laboratory testing (see below) which does not show evidence of inflammation, vitamin deficiencies, or paraproteinemias.  Further, electrodiagnostic testing of the lower extremities was  normal.  UPDATE 10/13/2016:  He is doing very well and reports marked improvement of hand paresthesias, with only 10% residual tingling of the last two digits.  He denies any weakness.  No new complaints.  Medications:  Current Outpatient Prescriptions on File Prior to Visit  Medication Sig Dispense Refill  . acetaminophen (TYLENOL) 650 MG CR tablet Take 650 mg by mouth every 8 (eight) hours as needed for pain.    [name] amiodarone (PACERONE) 100 MG tablet Take 1 tablet (100 mg total) by mouth daily. 30 tablet 1  . apixaban (ELIQUIS) 5 MG TABS tablet Take 1 tablet (5 mg total) by mouth 2 (two) times daily. 180 tablet 0  . CARTIA XT 240 MG 24 hr capsule take 1 capsule by mouth daily 30 capsule 6  . Cholecalciferol (VITAMIN D PO) Take 1 tablet by mouth daily.    [name] CIALIS 20 MG tablet Take 20 mg by mouth daily as needed for erectile dysfunction.   0  . FLUARIX QUADRIVALENT 0.5 ML injection inject 0.5 milliliter intramuscularly  0  . fluticasone (FLONASE) 50 MCG/ACT nasal spray Place 1 spray into both nostrils daily.  0  . Fluticasone-Salmeterol (ADVAIR) 250-50 MCG/DOSE AEPB Inhale 1 puff into the lungs 2 (two) times daily.     . furosemide (LASIX) 40 MG tablet Take 2 tablets (80 mg total) by mouth 2 (two) times daily. 120 tablet 1  . potassium chloride SA (K-DUR,KLOR-CON) 20 MEQ tablet take 1 tablet by mouth twice a day 60 tablet 6  . zolpidem (AMBIEN CR) 12.5 MG CR tablet Take 1 tablet by mouth at bedtime as  needed. For sleep     No current facility-administered medications on file prior to visit.     Allergies: No Known Allergies  Review of Systems:  CONSTITUTIONAL: No fevers, chills, night sweats, or weight loss.  EYES: No visual changes or eye pain ENT: No hearing changes.  No history of nose bleeds.   RESPIRATORY: No cough, wheezing and shortness of breath.   CARDIOVASCULAR: Negative for chest pain, and palpitations.   GI: Negative for abdominal discomfort, blood in stools or black  stools.  No recent change in bowel habits.   GU:  No history of incontinence.   MUSCLOSKELETAL: No history of joint pain or swelling.  No myalgias.   SKIN: Negative for lesions, rash, and itching.   ENDOCRINE: Negative for cold or heat intolerance, polydipsia or goiter.   PSYCH:  No depression or anxiety symptoms.   NEURO: As Above.   Vital Signs:  BP 130/74   Pulse 81   Ht '5\' 11"'$  (1.803 m)   Wt (!) 313 lb 8 oz (142.2 kg)   SpO2 93%   BMI 43.72 kg/m   Neurological Exam: MENTAL STATUS including orientation to time, place, person, recent and remote memory, attention span and concentration, language, and fund of knowledge is normal.  Speech is not dysarthric.  CRANIAL NERVES:  Face is symmetric.   MOTOR:  Motor strength is 5/5 in all extremities, including bilateral ADM (improved)  Tone is normal.    MSRs:  Reflexes are 2+/4 throughout  SENSORY:  Intact to vibration and temperature throughout.  COORDINATION/GAIT:  Normal finger-to- nose-finger.  Intact rapid alternating movements bilaterally.  Gait narrow based and stable.   Data: NCS/EMG of the upper extremities 07/22/2016:  The electrophysiologic studies are most consistent with asubacute, symmetric sensorimotor polyneuropathy, demyelinating and axon loss in type, affecting the upper extremities. Overall, these findings are moderate in degree electrically. Mononeuritis multiplex is considered and additional electrodiagnostic testing of the lower extremities may be indicated. Clinical correlation recommended.  NCS/EMG of the legs 08/12/2016:  normal  Labs 08/06/2016:  ESR 10, CRP 4.2, ANA neg, SSA/B neg, SPEP/UPEP with IFE no M protein, TSH 1.93, vitamin B12 327, vitamin B1 10, copper 340  Labs 08/19/2016:   folate, heavy metal screen, vitamin B6, ceruloplasmin, copper, ACE, zinc, HIV - normal     IMPRESSION/PLAN: Bilateral hand paresthesias, worse over the ulnar distribution started in August 2017 acutely.  By history, he  has features suggestive of bilateral ulnar neuropathy; however, his electrodiagnostic testing not only showed bilateral ulnar neuropathies but also bilateral median neuropathy.  He had extensive laboratory testing looking for vasculitis, autoimmune, vitamin deficiencies, toxic, and paraproteinemias which all returned normal.  Because he was showing clinical improvement, we decided not to pursue CSF testing and instead follow him clinically.  He returns today and has marked improvement in paresthesias and no longer as ADM weakness.  Unusual that symptoms started abruptly and also improved without intervention. Because he is neurologically intact, no further testing is needed.   Return to clinic for new neurological symptoms.    The duration of this appointment visit was 15 minutes of face-to-face time with the patient.  Greater than 50% of this time was spent in counseling, explanation of diagnosis, planning of further management, and coordination of care.   Thank you for allowing me to participate in patient's care.  If I can answer any additional questions, I would be pleased to do so.    Sincerely,    Elberta Lachapelle K. Posey Pronto,  DO

## 2016-11-14 ENCOUNTER — Other Ambulatory Visit: Payer: Self-pay | Admitting: Interventional Cardiology

## 2017-02-04 ENCOUNTER — Ambulatory Visit (INDEPENDENT_AMBULATORY_CARE_PROVIDER_SITE_OTHER): Payer: 59 | Admitting: Ophthalmology

## 2017-02-04 DIAGNOSIS — D3131 Benign neoplasm of right choroid: Secondary | ICD-10-CM

## 2017-02-04 DIAGNOSIS — H33302 Unspecified retinal break, left eye: Secondary | ICD-10-CM

## 2017-02-04 DIAGNOSIS — I10 Essential (primary) hypertension: Secondary | ICD-10-CM | POA: Diagnosis not present

## 2017-02-04 DIAGNOSIS — H43813 Vitreous degeneration, bilateral: Secondary | ICD-10-CM

## 2017-02-04 DIAGNOSIS — H353122 Nonexudative age-related macular degeneration, left eye, intermediate dry stage: Secondary | ICD-10-CM | POA: Diagnosis not present

## 2017-02-04 DIAGNOSIS — H35033 Hypertensive retinopathy, bilateral: Secondary | ICD-10-CM

## 2017-02-11 ENCOUNTER — Ambulatory Visit (INDEPENDENT_AMBULATORY_CARE_PROVIDER_SITE_OTHER): Payer: 59 | Admitting: Ophthalmology

## 2017-02-11 DIAGNOSIS — H33302 Unspecified retinal break, left eye: Secondary | ICD-10-CM

## 2017-02-18 ENCOUNTER — Encounter: Payer: Self-pay | Admitting: *Deleted

## 2017-03-01 ENCOUNTER — Ambulatory Visit
Admission: RE | Admit: 2017-03-01 | Discharge: 2017-03-01 | Disposition: A | Discharge status: Home / Self Care | Payer: 59 | Source: Ambulatory Visit | Attending: Interventional Cardiology | Admitting: Interventional Cardiology

## 2017-03-01 DIAGNOSIS — Z79899 Other long term (current) drug therapy: Principal | ICD-10-CM

## 2017-03-01 DIAGNOSIS — Z5181 Encounter for therapeutic drug level monitoring: Secondary | ICD-10-CM

## 2017-03-06 NOTE — Progress Notes (Signed)
Cardiology Office Note    Date:  03/07/2017   ID:  Nicholas Foley, DOB 05-May-1951, MRN [id number]  PCP:  Jonathon Bellows, MD  Cardiologist: [name], MD   Chief Complaint  Patient presents with  . Atrial Fibrillation  . Congestive Heart Failure    History of Present Illness:  Nicholas Foley is a 66 y.o. male who presents for paroxysmal atrial fibrillation, amiodarone therapy, obstructive sleep apnea, hypertension, chronic diastolic heart failure, morbid obesity, and left bundle branch block.  Doing about the same. He [facility] Exercise causes dyspnea. His endurance has improved since working out at [employer] on a regular basis. He denies palpitations, syncope, orthopnea, and PND. There is no peripheral edema.  Past Medical History:  Diagnosis Date  . Acute diastolic heart failure ([place]) 08/02/2013   BNP greater than 2000   . Atrial fibrillation with rapid ventricular response ([place]) 08/02/2013  . COPD (chronic obstructive pulmonary disease) (HCC)    Moderate by PFTs with response to bronchodilators, May 2014  . Erectile dysfunction   . Hypertension   . Insomnia 08/02/13  . Left bundle branch block 08/02/2013  . Morbid obesity ([place]) 08/02/2013  . OSA (obstructive sleep apnea)    severe OSA with AHI 74/hr now on CPAP at 8cm H2O  . Snoring 08/02/2013   Suspect sleep apnea     Past Surgical History:  Procedure Laterality Date  . CARDIOVERSION N/A 08/06/2013   Procedure: CARDIOVERSION;  Surgeon: Lelon Perla, MD;  Location: [facility] ENDOSCOPY;  Service: Cardiovascular;  Laterality: N/A;  spoke with [name]   . CARDIOVERSION N/A 09/03/2013   Procedure: CARDIOVERSION;  Surgeon: [name], MD;  Location: [place];  Service: Cardiovascular;  Laterality: N/A;  . NASAL SEPTUM SURGERY    . TEE WITHOUT CARDIOVERSION N/A 08/06/2013   Procedure: TRANSESOPHAGEAL ECHOCARDIOGRAM (TEE);  Surgeon: Lelon Perla, MD;  Location: [facility]  ENDOSCOPY;  Service: Cardiovascular;  Laterality: N/A;  . VASECTOMY      Current Medications: Outpatient Medications Prior to Visit  Medication Sig Dispense Refill  . acetaminophen (TYLENOL) 650 MG CR tablet Take 650 mg by mouth every 8 (eight) hours as needed for pain.    [name] CARTIA XT 240 MG 24 hr capsule take 1 capsule by mouth daily 30 capsule 6  . Cholecalciferol (VITAMIN D PO) Take 1 tablet by mouth daily.    [name] CIALIS 20 MG tablet Take 20 mg by mouth daily as needed for erectile dysfunction.   0  . ELIQUIS 5 MG TABS tablet take 1 tablet by mouth twice a day 180 tablet 3  . fluticasone (FLONASE) 50 MCG/ACT nasal spray Place 1 spray into both nostrils daily.  0  . Fluticasone-Salmeterol (ADVAIR) 250-50 MCG/DOSE AEPB Inhale 1 puff into the lungs 2 (two) times daily.     . furosemide (LASIX) 40 MG tablet take 2 tablet by mouth twice a day 360 tablet 3  . PACERONE 100 MG tablet take 1 tablet by mouth once daily 90 tablet 3  . potassium chloride SA (K-DUR,KLOR-CON) 20 MEQ tablet take 1 tablet by mouth twice a day 60 tablet 6  . zolpidem (AMBIEN CR) 12.5 MG CR tablet Take 1 tablet by mouth at bedtime as needed. For sleep    . FLUARIX QUADRIVALENT 0.5 ML injection inject 0.5 milliliter intramuscularly  0   No facility-administered medications prior to visit.  Allergies:   Patient has no known allergies.   Social History   Social History  . Marital status: Married    Spouse name: N/A  . Number of children: N/A  . Years of education: N/A   Occupational History  . real estate     investment   Social History Main Topics  . Smoking status: Former Smoker    Packs/day: 1.00    Years: 30.00    Types: Cigarettes, Cigars    Quit date: 08/02/2012  . Smokeless tobacco: Never Used  . Alcohol use 0.0 oz/week     Comment: once a week; previously 3-4 times per week   . Drug use: No  . Sexual activity: Yes   Other Topics Concern  . Not on file   Social History Narrative   Works as  a Engineer, structural for apartments   Lives with wife.  They have one grown daughter   Highest level of education:  college     Family History:  The patient's family history includes Atrial fibrillation in his mother; COPD in his father; Cerebral aneurysm in his brother; Healthy in his daughter.   ROS:   Please see the history of present illness.    Back pain, muscle discomfort, rash, loss, occasional PND, and excessive fatigue.  All other systems reviewed and are negative.   PHYSICAL EXAM:   VS:  BP (!) 142/88 (BP Location: Left Arm)   Pulse 85   Ht 5\' 11"  (1.803 m)   Wt (!) 316 lb 12.8 oz (143.7 kg)   BMI 44.18 kg/m    GEN: Well nourished, well developed, in no acute distress  HEENT: normal  Neck: no JVD, carotid bruits, or masses Cardiac: RRR; no murmurs, rubs, or gallops,no edema. Rash anterior left shin  Respiratory:  clear to auscultation bilaterally, normal work of breathing GI: soft, nontender, nondistended, + BS MS: no deformity or atrophy  Skin: warm and dry, no rash Neuro:  Alert and Oriented x 3, Strength and sensation are intact Psych: euthymic mood, full affect  Wt Readings from Last 3 Encounters:  03/07/17 (!) 316 lb 12.8 oz (143.7 kg)  10/13/16 (!) 313 lb 8 oz (142.2 kg)  09/09/16 (!) 311 lb 12.8 oz (141.4 kg)      Studies/Labs Reviewed:   EKG:  EKG  None.  Recent Labs: 08/06/2016: TSH 1.93   Lipid Panel    Component Value Date/Time   CHOL 184 08/03/2013 0640   TRIG 149 08/03/2013 0640   HDL 50 08/03/2013 0640   CHOLHDL 3.7 08/03/2013 0640   VLDL 30 08/03/2013 0640   LDLCALC 104 (H) 08/03/2013 0640    Additional studies/ records that were reviewed today include:  Chest x-ray done 03/01/17 reveals no acute abnormality. Oh evidence CHF.  Blood work is pending.  ASSESSMENT:    1. Chronic diastolic heart failure ([place])   2. Hypertension, essential   3. Left bundle branch block   4. Paroxysmal A-fib ([place])   5. Chronic obstructive pulmonary  disease, unspecified COPD type ([place])   6. OSA (obstructive sleep apnea)      PLAN:  In order of problems listed above:  1. No evidence of volume overload. Limit salt in diet. 2. Blood pressure is adequately controlled. Needs weight loss. We discussed. He is making an effort. 3. Not reassessed. 4. No clinical recurrence of atrial fibrillation on low-dose amiodarone. Without recurrent atrial fibrillation. Continue Apixiban for stroke prophylaxis. 5. Not addressed 6. Continue Cipro. 7. Encouraged  to continue C Pap.  Overall, not making any progress with weight reduction. Obesity along with sleep apnea are his major comorbidities. Continue current medical regimen. Is no evidence of amiodarone toxicity. Clinical follow-up in 6 months. Blood work is pending today.  Medication Adjustments/Labs and Tests Ordered: Current medicines are reviewed at length with the patient today.  Concerns regarding medicines are outlined above.  Medication changes, Labs and Tests ordered today are listed in the Patient Instructions below. Patient Instructions  Medication Instructions:  Your physician recommends that you continue on your current medications as directed. Please refer to the Current Medication list given to you today.   Labwork: Will call back with results once they come in.  Testing/Procedures: None ordered  Follow-Up: Your physician wants you to follow-up in 6 months with Dr. [name]. You will receive a reminder letter in the mail two months in advance. If you don't receive a letter, please call our office to schedule the follow-up appointment.   Any Other Special Instructions Will Be Listed Below (If Applicable).     If you need a refill on your cardiac medications before your next appointment, please call your pharmacy.      Signed, [name], MD  03/07/2017 3:20 PM    [facility] [place], [place], [place]  [address] Phone: [phone]; Fax:  [phone]

## 2017-03-07 ENCOUNTER — Ambulatory Visit (INDEPENDENT_AMBULATORY_CARE_PROVIDER_SITE_OTHER): Payer: 59 | Admitting: Interventional Cardiology

## 2017-03-07 ENCOUNTER — Other Ambulatory Visit: Payer: 59 | Admitting: *Deleted

## 2017-03-07 ENCOUNTER — Other Ambulatory Visit: Payer: Self-pay | Admitting: Interventional Cardiology

## 2017-03-07 VITALS — BP 142/88 | HR 85 | Ht 71.0 in | Wt 316.8 lb

## 2017-03-07 DIAGNOSIS — I5032 Chronic diastolic (congestive) heart failure: Secondary | ICD-10-CM

## 2017-03-07 DIAGNOSIS — G4733 Obstructive sleep apnea (adult) (pediatric): Secondary | ICD-10-CM

## 2017-03-07 DIAGNOSIS — Z5181 Encounter for therapeutic drug level monitoring: Secondary | ICD-10-CM

## 2017-03-07 DIAGNOSIS — I480 Paroxysmal atrial fibrillation: Secondary | ICD-10-CM | POA: Diagnosis not present

## 2017-03-07 DIAGNOSIS — J449 Chronic obstructive pulmonary disease, unspecified: Secondary | ICD-10-CM

## 2017-03-07 DIAGNOSIS — I447 Left bundle-branch block, unspecified: Secondary | ICD-10-CM | POA: Diagnosis not present

## 2017-03-07 DIAGNOSIS — Z79899 Other long term (current) drug therapy: Principal | ICD-10-CM

## 2017-03-07 DIAGNOSIS — I10 Essential (primary) hypertension: Secondary | ICD-10-CM

## 2017-03-07 LAB — HEPATIC FUNCTION PANEL
ALT: 22 IU/L (ref 0–44)
AST: 13 IU/L (ref 0–40)
Albumin: 4.3 g/dL (ref 3.6–4.8)
Alkaline Phosphatase: 75 IU/L (ref 39–117)
BILIRUBIN TOTAL: 0.3 mg/dL (ref 0.0–1.2)
Bilirubin, Direct: 0.09 mg/dL (ref 0.00–0.40)
Total Protein: 7.2 g/dL (ref 6.0–8.5)

## 2017-03-07 LAB — TSH: TSH: 2 u[IU]/mL (ref 0.450–4.500)

## 2017-03-07 NOTE — Patient Instructions (Signed)
Medication Instructions:  Your physician recommends that you continue on your current medications as directed. Please refer to the Current Medication list given to you today.   Labwork: Will call back with results once they come in.  Testing/Procedures: None ordered  Follow-Up: Your physician wants you to follow-up in 6 months with Dr. [name]. You will receive a reminder letter in the mail two months in advance. If you don't receive a letter, please call our office to schedule the follow-up appointment.   Any Other Special Instructions Will Be Listed Below (If Applicable).     If you need a refill on your cardiac medications before your next appointment, please call your pharmacy.

## 2017-03-08 ENCOUNTER — Telehealth: Payer: Self-pay | Admitting: Interventional Cardiology

## 2017-03-08 NOTE — Telephone Encounter (Signed)
Informed pt of lab results. Pt verbalized understanding. 

## 2017-03-08 NOTE — Telephone Encounter (Signed)
New Message     Leave complete message on vm with his lab results , this is a private line

## 2017-03-15 ENCOUNTER — Other Ambulatory Visit: Payer: Self-pay | Admitting: Interventional Cardiology

## 2017-03-15 DIAGNOSIS — I5031 Acute diastolic (congestive) heart failure: Secondary | ICD-10-CM

## 2017-04-22 ENCOUNTER — Encounter (INDEPENDENT_AMBULATORY_CARE_PROVIDER_SITE_OTHER): Payer: Self-pay

## 2017-04-22 ENCOUNTER — Encounter: Payer: Self-pay | Admitting: Cardiology

## 2017-04-22 ENCOUNTER — Ambulatory Visit (INDEPENDENT_AMBULATORY_CARE_PROVIDER_SITE_OTHER): Payer: 59 | Admitting: Cardiology

## 2017-04-22 VITALS — BP 164/80 | HR 97 | Ht 71.0 in | Wt 316.8 lb

## 2017-04-22 DIAGNOSIS — G4733 Obstructive sleep apnea (adult) (pediatric): Secondary | ICD-10-CM

## 2017-04-22 DIAGNOSIS — I10 Essential (primary) hypertension: Secondary | ICD-10-CM | POA: Diagnosis not present

## 2017-04-22 NOTE — Progress Notes (Signed)
Cardiology Office Note    Date:  04/22/2017   ID:  Nicholas Foley, DOB 1950/12/01, MRN [id number]  PCP:  [name], MD  Cardiologist:  Daneen Schick, MD   Chief Complaint  Patient presents with  . Sleep Apnea  . Hypertension    History of Present Illness:  Nicholas Foley is a 66 y.o. male  with a history of persistent AF, HTN and OSA. He is doing well with his CPAP. He tolerates the nasal pillow mask and feels the pressure is adequate. He continues to feel rested in the am and has no daytime sleepiness when he sleeps well at night. Occasionally he will take at nap at lunch.  He does not think that he snores anymore.  He occasionally will have some mouth and nasal dryness but not too bad.  He has chronic nasal congestion but it is stable.   Past Medical History:  Diagnosis Date  . Acute diastolic heart failure ([place]) 08/02/2013   BNP greater than 2000   . Atrial fibrillation with rapid ventricular response ([place]) 08/02/2013  . COPD (chronic obstructive pulmonary disease) (HCC)    Moderate by PFTs with response to bronchodilators, May 2014  . Erectile dysfunction   . Hypertension   . Insomnia 08/02/13  . Left bundle branch block 08/02/2013  . Morbid obesity ([place]) 08/02/2013  . OSA (obstructive sleep apnea)    severe OSA with AHI 74/hr now on CPAP at 8cm H2O  . Snoring 08/02/2013   Suspect sleep apnea     Past Surgical History:  Procedure Laterality Date  . CARDIOVERSION N/A 08/06/2013   Procedure: CARDIOVERSION;  Surgeon: Lelon Perla, MD;  Location: [facility] ENDOSCOPY;  Service: Cardiovascular;  Laterality: N/A;  spoke with [name]   . CARDIOVERSION N/A 09/03/2013   Procedure: CARDIOVERSION;  Surgeon: [name], MD;  Location: [place];  Service: Cardiovascular;  Laterality: N/A;  . NASAL SEPTUM SURGERY    . TEE WITHOUT CARDIOVERSION N/A 08/06/2013   Procedure: TRANSESOPHAGEAL ECHOCARDIOGRAM (TEE);  Surgeon: Lelon Perla, MD;  Location: [facility] ENDOSCOPY;  Service:  Cardiovascular;  Laterality: N/A;  . VASECTOMY      Current Medications: Current Meds  Medication Sig  . acetaminophen (TYLENOL) 650 MG CR tablet Take 650 mg by mouth every 8 (eight) hours as needed for pain.  [name] CARTIA XT 240 MG 24 hr capsule take 1 capsule by mouth once daily  . Cholecalciferol (VITAMIN D PO) Take 1 tablet by mouth daily.  [name] CIALIS 20 MG tablet Take 20 mg by mouth daily as needed for erectile dysfunction.   [name] ELIQUIS 5 MG TABS tablet take 1 tablet by mouth twice a day  . fluticasone (FLONASE) 50 MCG/ACT nasal spray Place 1 spray into both nostrils daily.  . Fluticasone-Salmeterol (ADVAIR) 250-50 MCG/DOSE AEPB Inhale 1 puff into the lungs 2 (two) times daily.   . furosemide (LASIX) 40 MG tablet take 2 tablet by mouth twice a day  . Omeprazole Magnesium (PRILOSEC OTC PO) Take by mouth daily. Heartburn..the patient unsure of dosage .  [name] PACERONE 100 MG tablet take 1 tablet by mouth once daily  . potassium chloride SA (K-DUR,KLOR-CON) 20 MEQ tablet take 1 tablet by mouth twice a day  . vitamin B-12 (CYANOCOBALAMIN) 1000 MCG tablet Take 1,000 mcg by mouth daily.  [name] zolpidem (AMBIEN CR) 12.5 MG CR tablet Take 1 tablet by mouth at bedtime as needed. For sleep    Allergies:   Patient has no  known allergies.   Social History   Social History  . Marital status: Married    Spouse name: N/A  . Number of children: N/A  . Years of education: N/A   Occupational History  . real estate     investment   Social History Main Topics  . Smoking status: Former Smoker    Packs/day: 1.00    Years: 30.00    Types: Cigarettes, Cigars    Quit date: 08/02/2012  . Smokeless tobacco: Never Used  . Alcohol use 0.0 oz/week     Comment: once a week; previously 3-4 times per week   . Drug use: No  . Sexual activity: Yes   Other Topics Concern  . None   Social History Narrative   Works as a Engineer, structural for apartments   Lives with wife.  They have one grown daughter    Highest level of education:  college     Family History:  The patient's family history includes Atrial fibrillation in his mother; COPD in his father; Cerebral aneurysm in his brother; Healthy in his daughter.   ROS:   Please see the history of present illness.    Review of Systems  Musculoskeletal: Positive for back pain and muscle cramps.   All other systems reviewed and are negative.  No flowsheet data found.     PHYSICAL EXAM:   VS:  BP (!) 164/80   Pulse 97   Ht 5\' 11"  (1.803 m)   Wt (!) 316 lb 12.8 oz (143.7 kg)   SpO2 95%   BMI 44.18 kg/m    GEN: Well nourished, well developed, in no acute distress  HEENT: normal  Neck: no JVD, carotid bruits, or masses Cardiac: RRR; no murmurs, rubs, or gallops,no edema.  Intact distal pulses bilaterally.  Respiratory:  clear to auscultation bilaterally, normal work of breathing GI: soft, nontender, nondistended, + BS MS: no deformity or atrophy  Skin: diffuse erythematous rash pruritic rash on LLE with excortiations. Neuro:  Alert and Oriented x 3, Strength and sensation are intact Psych: euthymic mood, full affect  Wt Readings from Last 3 Encounters:  04/22/17 (!) 316 lb 12.8 oz (143.7 kg)  03/07/17 (!) 316 lb 12.8 oz (143.7 kg)  10/13/16 (!) 313 lb 8 oz (142.2 kg)      Studies/Labs Reviewed:   EKG:  EKG is not ordered today.   Recent Labs: 03/07/2017: ALT 22; TSH 2.000   Lipid Panel    Component Value Date/Time   CHOL 184 08/03/2013 0640   TRIG 149 08/03/2013 0640   HDL 50 08/03/2013 0640   CHOLHDL 3.7 08/03/2013 0640   VLDL 30 08/03/2013 0640   LDLCALC 104 (H) 08/03/2013 0640    Additional studies/ records that were reviewed today include:  CPAP download    ASSESSMENT:    1. OSA (obstructive sleep apnea)   2. Morbid obesity ([place])   3. Hypertension, essential      PLAN:  In order of problems listed above:  OSA - the patient is tolerating PAP therapy well without any problems. The PAP download was  reviewed today and showed an AHI of 2/hr on 8 cm H2O with 100% compliance in using more than 4 hours nightly.  The patient has been using and benefiting from CPAP use and will continue to benefit from therapy.  2.   Morbid obesity - I have encouraged him to get into a routine exercise program and cut back on carbs and portions.  3.  HTN - BP is controlled on exam today.  He will continue on Cartia XT 240mg  daily.  He says that his BP is elevated because he just had a heated exchange with a car salesman.   Medication Adjustments/Labs and Tests Ordered: Current medicines are reviewed at length with the patient today.  Concerns regarding medicines are outlined above.  Medication changes, Labs and Tests ordered today are listed in the Patient Instructions below.  There are no Patient Instructions on file for this visit.   Signed, Fransico Him, MD  04/22/2017 1:55 PM    [facility] [place], [place], [place]  [address] Phone: [phone]; Fax: [phone]

## 2017-04-22 NOTE — Patient Instructions (Signed)

## 2017-06-17 ENCOUNTER — Ambulatory Visit (INDEPENDENT_AMBULATORY_CARE_PROVIDER_SITE_OTHER): Payer: 59 | Admitting: Ophthalmology

## 2017-06-17 DIAGNOSIS — I10 Essential (primary) hypertension: Secondary | ICD-10-CM | POA: Diagnosis not present

## 2017-06-17 DIAGNOSIS — H43813 Vitreous degeneration, bilateral: Secondary | ICD-10-CM

## 2017-06-17 DIAGNOSIS — H35033 Hypertensive retinopathy, bilateral: Secondary | ICD-10-CM | POA: Diagnosis not present

## 2017-06-17 DIAGNOSIS — H33302 Unspecified retinal break, left eye: Secondary | ICD-10-CM | POA: Diagnosis not present

## 2017-06-17 DIAGNOSIS — D1809 Hemangioma of other sites: Secondary | ICD-10-CM

## 2017-06-17 DIAGNOSIS — H353122 Nonexudative age-related macular degeneration, left eye, intermediate dry stage: Secondary | ICD-10-CM | POA: Diagnosis not present

## 2017-08-29 ENCOUNTER — Encounter: Payer: Self-pay | Admitting: Interventional Cardiology

## 2017-09-09 ENCOUNTER — Ambulatory Visit (INDEPENDENT_AMBULATORY_CARE_PROVIDER_SITE_OTHER): Payer: 59 | Admitting: Interventional Cardiology

## 2017-09-09 ENCOUNTER — Encounter: Payer: Self-pay | Admitting: Interventional Cardiology

## 2017-09-09 ENCOUNTER — Other Ambulatory Visit: Payer: Self-pay | Admitting: Interventional Cardiology

## 2017-09-09 VITALS — BP 156/86 | HR 79 | Ht 71.0 in | Wt 323.0 lb

## 2017-09-09 DIAGNOSIS — I10 Essential (primary) hypertension: Secondary | ICD-10-CM | POA: Diagnosis not present

## 2017-09-09 DIAGNOSIS — I447 Left bundle-branch block, unspecified: Secondary | ICD-10-CM | POA: Diagnosis not present

## 2017-09-09 DIAGNOSIS — I5031 Acute diastolic (congestive) heart failure: Secondary | ICD-10-CM

## 2017-09-09 DIAGNOSIS — G4733 Obstructive sleep apnea (adult) (pediatric): Secondary | ICD-10-CM

## 2017-09-09 DIAGNOSIS — J449 Chronic obstructive pulmonary disease, unspecified: Secondary | ICD-10-CM | POA: Diagnosis not present

## 2017-09-09 DIAGNOSIS — Z7901 Long term (current) use of anticoagulants: Secondary | ICD-10-CM | POA: Diagnosis not present

## 2017-09-09 DIAGNOSIS — I480 Paroxysmal atrial fibrillation: Secondary | ICD-10-CM | POA: Diagnosis not present

## 2017-09-09 DIAGNOSIS — Z79899 Other long term (current) drug therapy: Secondary | ICD-10-CM | POA: Insufficient documentation

## 2017-09-09 DIAGNOSIS — I5032 Chronic diastolic (congestive) heart failure: Secondary | ICD-10-CM

## 2017-09-09 NOTE — Telephone Encounter (Signed)
AVS Reports   Date/Time Report Action User  09/09/2017 2:16 PM After Visit Summary Printed Loren Racer, LPN  23/53/6144 3:15 PM After Visit Summary Printed Loren Racer, LPN  Patient Instructions   Medication Instructions:  1) DISCONTINUE Amiodarone

## 2017-09-09 NOTE — Patient Instructions (Addendum)
Medication Instructions:  1) DISCONTINUE Amiodarone  Labwork: None  Testing/Procedures: None  Follow-Up: Your physician recommends that you schedule a follow-up appointment in: 3 months with Dr. [name].     Any Other Special Instructions Will Be Listed Below (If Applicable).     If you need a refill on your cardiac medications before your next appointment, please call your pharmacy.

## 2017-09-09 NOTE — Progress Notes (Signed)
Cardiology Office Note    Date:  09/09/2017   ID:  Nicholas Foley, DOB [birth year], MRN [id number]  PCP:  [name], MD  Cardiologist: [name], MD   Chief Complaint  Patient presents with  . Congestive Heart Failure  . Atrial Fibrillation    History of Present Illness:  Nicholas Foley is a 66 y.o. male who presents for paroxysmal atrial fibrillation, amiodarone therapy, obstructive sleep apnea, hypertension, chronic anticoagulation (Apixaban), chronic diastolic heart failure, morbid obesity, and left bundle branch block.   He points out some stasis dermatitis. Otherwise no complaints. Has not had palpitations or exertional fatigue as he did with atrial fibrillation in the past. Denies chest pain. No orthopnea PND. No medication side effects that he is able to ascertain.  Past Medical History:  Diagnosis Date  . Acute diastolic heart failure ([place]) 08/02/2013   BNP greater than 2000   . Atrial fibrillation with rapid ventricular response ([place]) 08/02/2013  . COPD (chronic obstructive pulmonary disease) (HCC)    Moderate by PFTs with response to bronchodilators, May 2014  . Erectile dysfunction   . Hypertension   . Insomnia 08/02/13  . Left bundle branch block 08/02/2013  . Morbid obesity ([place]) 08/02/2013  . OSA (obstructive sleep apnea)    severe OSA with AHI 74/hr now on CPAP at 8cm H2O  . Snoring 08/02/2013   Suspect sleep apnea     Past Surgical History:  Procedure Laterality Date  . CARDIOVERSION N/A 08/06/2013   Procedure: CARDIOVERSION;  Surgeon: Lelon Perla, MD;  Location: [facility] ENDOSCOPY;  Service: Cardiovascular;  Laterality: N/A;  spoke with [name]   . CARDIOVERSION N/A 09/03/2013   Procedure: CARDIOVERSION;  Surgeon: [name], MD;  Location: [facility];  Service: Cardiovascular;  Laterality: N/A;  . NASAL SEPTUM SURGERY    . TEE WITHOUT CARDIOVERSION N/A 08/06/2013   Procedure: TRANSESOPHAGEAL ECHOCARDIOGRAM (TEE);  Surgeon: Lelon Perla,  MD;  Location: [facility] ENDOSCOPY;  Service: Cardiovascular;  Laterality: N/A;  . VASECTOMY      Current Medications: Outpatient Medications Prior to Visit  Medication Sig Dispense Refill  . acetaminophen (TYLENOL) 650 MG CR tablet Take 650 mg by mouth every 8 (eight) hours as needed for pain.    [name] CARTIA XT 240 MG 24 hr capsule take 1 capsule by mouth once daily 30 capsule 11  . Cholecalciferol (VITAMIN D PO) Take 1 tablet by mouth daily.    [name] CIALIS 20 MG tablet Take 20 mg by mouth daily as needed for erectile dysfunction.   0  . ELIQUIS 5 MG TABS tablet take 1 tablet by mouth twice a day 180 tablet 3  . fluticasone (FLONASE) 50 MCG/ACT nasal spray Place 1 spray into both nostrils daily.  0  . Fluticasone-Salmeterol (ADVAIR) 250-50 MCG/DOSE AEPB Inhale 1 puff into the lungs 2 (two) times daily.     . furosemide (LASIX) 40 MG tablet take 2 tablet by mouth twice a day 360 tablet 3  . Omeprazole Magnesium (PRILOSEC OTC PO) Take by mouth daily. Heartburn..the patient unsure of dosage .    . potassium chloride SA (K-DUR,KLOR-CON) 20 MEQ tablet take 1 tablet by mouth twice a day 60 tablet 11  . vitamin B-12 (CYANOCOBALAMIN) 1000 MCG tablet Take 1,000 mcg by mouth daily.    [name] zolpidem (AMBIEN CR) 12.5 MG CR tablet Take 1 tablet by mouth at bedtime as needed. For sleep    . PACERONE 100 MG tablet take  1 tablet by mouth once daily 90 tablet 3   No facility-administered medications prior to visit.      Allergies:   Patient has no known allergies.   Social History   Social History  . Marital status: Married    Spouse name: N/A  . Number of children: N/A  . Years of education: N/A   Occupational History  . real estate     investment   Social History Main Topics  . Smoking status: Former Smoker    Packs/day: 1.00    Years: 30.00    Types: Cigarettes, Cigars    Quit date: 08/02/2012  . Smokeless tobacco: Never Used  . Alcohol use 0.0 oz/week     Comment: once a week; previously 3-4 times  per week   . Drug use: No  . Sexual activity: Yes   Other Topics Concern  . Not on file   Social History Narrative   Works as a Engineer, structural for apartments   Lives with wife.  They have one grown daughter   Highest level of education:  college     Family History:  The patient's family history includes Atrial fibrillation in his mother; COPD in his father; Cerebral aneurysm in his brother; Healthy in his daughter.   ROS:   Please see the history of present illness.    Swelling left lower extremity greater than right. Itching in left lower extremity. Has seen dermatologist concerning this. Compliant with CP. All other systems reviewed and are negative.   PHYSICAL EXAM:   VS:  BP (!) 156/86 (BP Location: Left Arm)   Pulse 79   Ht 5\' 11"  (1.803 m)   Wt (!) 323 lb (146.5 kg)   BMI 45.05 kg/m    GEN: Well nourished, well developed, in no acute distress  HEENT: normal  Neck: no JVD, carotid bruits, or masses Cardiac: RRR; no murmurs, rubs, or gallops,no edema  Respiratory:  clear to auscultation bilaterally, normal work of breathing GI: soft, nontender, nondistended, + BS MS: no deformity or atrophy  Skin: warm and dry, no rash Neuro:  Alert and Oriented x 3, Strength and sensation are intact Psych: euthymic mood, full affect  Wt Readings from Last 3 Encounters:  09/09/17 (!) 323 lb (146.5 kg)  04/22/17 (!) 316 lb 12.8 oz (143.7 kg)  03/07/17 (!) 316 lb 12.8 oz (143.7 kg)      Studies/Labs Reviewed:   EKG:  EKG  Normal sinus rhythm with left axis deviation. Nonspecific T wave flattening.there is no change when compared to prior.  Recent Labs: 03/07/2017: ALT 22; TSH 2.000   Lipid Panel    Component Value Date/Time   CHOL 184 08/03/2013 0640   TRIG 149 08/03/2013 0640   HDL 50 08/03/2013 0640   CHOLHDL 3.7 08/03/2013 0640   VLDL 30 08/03/2013 0640   LDLCALC 104 (H) 08/03/2013 0640    Additional studies/ records that were reviewed today include:  No new  data    ASSESSMENT:    1. Paroxysmal A-fib ([place])   2. Chronic diastolic heart failure ([place])   3. Left bundle branch block   4. Hypertension, essential   5. Chronic obstructive pulmonary disease, unspecified COPD type ([place])   6. OSA (obstructive sleep apnea)   7. Chronic anticoagulation   8. On amiodarone therapy      PLAN:  In order of problems listed above:  1. Maintaining sinus rhythm on very low-dose amiodarone. Sleep apnea has been diagnosed and under  therapy now for several years. Plan to discontinue amiodarone which is only low dose and get follow-up EKG in 3 months with office visit with me in 6 months. 2. No evidence of volume overload. 3. The electrocardiogram does not reveal evidence of left bundle branch block. He has left axis deviation otherwise unremarkable. 4. Excellent blood pressure control. 5. Currently being managed with Advair. 6. Continue C Pap. 7. Continue apixaban 8. Amiodarone has been discontinued on today's visit.  Clinical follow-up in 3 months with EKG. We discussed the possibility of recurrent atrial fibrillation off of amiodarone. I'd rather determine if he can maintain sinus rhythm off the medications and exposed him in definitely if it is not needed. Hopefully management of sleep apnea which I believe versus trigger will prevent recurrence.  Medication Adjustments/Labs and Tests Ordered: Current medicines are reviewed at length with the patient today.  Concerns regarding medicines are outlined above.  Medication changes, Labs and Tests ordered today are listed in the Patient Instructions below. Patient Instructions  Medication Instructions:  1) DISCONTINUE Amiodarone  Labwork: None  Testing/Procedures: None  Follow-Up: Your physician recommends that you schedule a follow-up appointment in: 3 months with Dr. [name].     Any Other Special Instructions Will Be Listed Below (If Applicable).     If you need a refill on your cardiac  medications before your next appointment, please call your pharmacy.      Signed, [name], MD  09/09/2017 2:19 PM    [facility] [address] [place], [place], [place]  [address] Phone: [phone]; Fax: [phone]

## 2017-10-12 ENCOUNTER — Other Ambulatory Visit: Payer: Self-pay | Admitting: *Deleted

## 2017-10-12 MED ORDER — APIXABAN 5 MG PO TABS: 5.0000 mg | ORAL_TABLET | Freq: Two times a day (BID) | ORAL | 3 refills | Status: DC

## 2017-10-12 NOTE — Telephone Encounter (Addendum)
Eliquis 5mg  refill received; pt is 66 yrs old, wt-146.5kg, last seen by Dr. [name] on 09/09/17, Crea-1.43 on 08/02/2017 from PCP office as called for latest labs. Refill sent per request.

## 2017-12-02 ENCOUNTER — Encounter: Payer: Self-pay | Admitting: Interventional Cardiology

## 2017-12-02 ENCOUNTER — Ambulatory Visit: Payer: 59 | Admitting: Interventional Cardiology

## 2017-12-02 VITALS — BP 154/78 | HR 82 | Ht 71.0 in | Wt 325.0 lb

## 2017-12-02 DIAGNOSIS — I480 Paroxysmal atrial fibrillation: Secondary | ICD-10-CM

## 2017-12-02 DIAGNOSIS — Z79899 Other long term (current) drug therapy: Secondary | ICD-10-CM

## 2017-12-02 DIAGNOSIS — Z7901 Long term (current) use of anticoagulants: Secondary | ICD-10-CM

## 2017-12-02 DIAGNOSIS — I5032 Chronic diastolic (congestive) heart failure: Secondary | ICD-10-CM

## 2017-12-02 DIAGNOSIS — I10 Essential (primary) hypertension: Secondary | ICD-10-CM

## 2017-12-02 NOTE — Patient Instructions (Signed)
Medication Instructions:  Your physician recommends that you continue on your current medications as directed. Please refer to the Current Medication list given to you today.  Labwork: None  Testing/Procedures: None  Follow-Up: Your physician wants you to follow-up in: 6 months with Dr. [name] with an EKG.  You will receive a reminder letter in the mail two months in advance. If you don't receive a letter, please call our office to schedule the follow-up appointment.   Any Other Special Instructions Will Be Listed Below (If Applicable).     If you need a refill on your cardiac medications before your next appointment, please call your pharmacy.

## 2017-12-02 NOTE — Progress Notes (Signed)
Cardiology Office Note    Date:  12/02/2017   ID:  Nicholas Foley, DOB [birth year], MRN [id number]  PCP:  [name], MD  Cardiologist: [name], MD   Chief Complaint  Patient presents with  . Atrial Fibrillation    History of Present Illness:  Nicholas Foley is a 67 y.o. male who presents for paroxysmal atrial fibrillation, amiodarone therapy, obstructive sleep apnea, hypertension, chronic anticoagulation (Apixaban), chronic diastolic heart failure, morbid obesity, and left bundle branch block.  Amiodarone was discontinued in late 2018 returns today for EKG and to rule out recurrence.  Belen feels well.  He has had no palpitations since discontinuation of amiodarone greater than 3 months ago.  He denies chest pain, orthopnea, PND, and syncope.   Past Medical History:  Diagnosis Date  . Acute diastolic heart failure ([place]) 08/02/2013   BNP greater than 2000   . Atrial fibrillation with rapid ventricular response ([place]) 08/02/2013  . COPD (chronic obstructive pulmonary disease) (HCC)    Moderate by PFTs with response to bronchodilators, May 2014  . Erectile dysfunction   . Hypertension   . Insomnia 08/02/13  . Left bundle branch block 08/02/2013  . Morbid obesity ([place]) 08/02/2013  . OSA (obstructive sleep apnea)    severe OSA with AHI 74/hr now on CPAP at 8cm H2O  . Snoring 08/02/2013   Suspect sleep apnea     Past Surgical History:  Procedure Laterality Date  . CARDIOVERSION N/A 08/06/2013   Procedure: CARDIOVERSION;  Surgeon: Lelon Perla, MD;  Location: [facility] ENDOSCOPY;  Service: Cardiovascular;  Laterality: N/A;  spoke with [name]   . CARDIOVERSION N/A 09/03/2013   Procedure: CARDIOVERSION;  Surgeon: [name], MD;  Location: [facility];  Service: Cardiovascular;  Laterality: N/A;  . NASAL SEPTUM SURGERY    . TEE WITHOUT CARDIOVERSION N/A 08/06/2013   Procedure: TRANSESOPHAGEAL ECHOCARDIOGRAM (TEE);  Surgeon: Lelon Perla, MD;  Location: [facility] ENDOSCOPY;   Service: Cardiovascular;  Laterality: N/A;  . VASECTOMY      Current Medications: Outpatient Medications Prior to Visit  Medication Sig Dispense Refill  . acetaminophen (TYLENOL) 650 MG CR tablet Take 650 mg by mouth every 8 (eight) hours as needed for pain.    [name] apixaban (ELIQUIS) 5 MG TABS tablet Take 1 tablet (5 mg total) by mouth 2 (two) times daily. 180 tablet 3  . CARTIA XT 240 MG 24 hr capsule take 1 capsule by mouth once daily 30 capsule 11  . Cholecalciferol (VITAMIN D PO) Take 1 tablet by mouth daily.    [name] CIALIS 20 MG tablet Take 20 mg by mouth daily as needed for erectile dysfunction.   0  . fluticasone (FLONASE) 50 MCG/ACT nasal spray Place 1 spray into both nostrils daily.  0  . Fluticasone-Salmeterol (ADVAIR) 250-50 MCG/DOSE AEPB Inhale 1 puff into the lungs 2 (two) times daily.     . furosemide (LASIX) 40 MG tablet take 2 tablets by mouth twice a day 360 tablet 3  . halobetasol (ULTRAVATE) 0.05 % cream Apply 1 application topically daily.    . Omeprazole Magnesium (PRILOSEC OTC PO) Take by mouth daily. Heartburn..the patient unsure of dosage .    . potassium chloride SA (K-DUR,KLOR-CON) 20 MEQ tablet take 1 tablet by mouth twice a day 60 tablet 11  . vitamin B-12 (CYANOCOBALAMIN) 1000 MCG tablet Take 1,000 mcg by mouth daily.    [name] zolpidem (AMBIEN CR) 12.5 MG CR tablet Take 1 tablet by  mouth at bedtime as needed. For sleep     No facility-administered medications prior to visit.      Allergies:   Patient has no known allergies.   Social History   Socioeconomic History  . Marital status: Married    Spouse name: Not on file  . Number of children: Not on file  . Years of education: Not on file  . Highest education level: Not on file  Social Needs  . Financial resource strain: Not on file  . Food insecurity - worry: Not on file  . Food insecurity - inability: Not on file  . Transportation needs - medical: Not on file  . Transportation needs - non-medical: Not on  file  Occupational History  . Occupation: real estate    Comment: investment  Tobacco Use  . Smoking status: Former Smoker    Packs/day: 1.00    Years: 30.00    Pack years: 30.00    Types: Cigarettes, Cigars    Last attempt to quit: 08/02/2012    Years since quitting: 5.3  . Smokeless tobacco: Never Used  Substance and Sexual Activity  . Alcohol use: Yes    Alcohol/week: 0.0 oz    Comment: once a week; previously 3-4 times per week   . Drug use: No  . Sexual activity: Yes  Other Topics Concern  . Not on file  Social History Narrative   Works as a Engineer, structural for apartments   Lives with wife.  They have one grown daughter   Highest level of education:  college     Family History:  The patient's family history includes Atrial fibrillation in his mother; COPD in his father; Cerebral aneurysm in his brother; Healthy in his daughter.   ROS:   Please see the history of present illness.    No complaints All other systems reviewed and are negative.   PHYSICAL EXAM:   VS:  BP (!) 154/78   Pulse 82   Ht 5\' 11"  (1.803 m)   Wt (!) 325 lb (147.4 kg)   BMI 45.33 kg/m    GEN: Well nourished, morbidly obese. HEENT: normal  Neck: no JVD, carotid bruits, or masses Cardiac: RRR; no murmurs, rubs, or gallops,no edema  Respiratory:  clear to auscultation bilaterally, normal work of breathing GI: soft, nontender, nondistended, + BS MS: no deformity or atrophy  Skin: warm and dry, no rash Neuro:  Alert and Oriented x 3, Strength and sensation are intact Psych: euthymic mood, full affect  Wt Readings from Last 3 Encounters:  12/02/17 (!) 325 lb (147.4 kg)  09/09/17 (!) 323 lb (146.5 kg)  04/22/17 (!) 316 lb 12.8 oz (143.7 kg)      Studies/Labs Reviewed:   EKG:  EKG ectopic atrial pacemaker rhythm with nonspecific T wave abnormality in the inferior leads.  Heart rate 82 bpm.  Recent Labs: 03/07/2017: ALT 22; TSH 2.000   Lipid Panel    Component Value Date/Time    CHOL 184 08/03/2013 0640   TRIG 149 08/03/2013 0640   HDL 50 08/03/2013 0640   CHOLHDL 3.7 08/03/2013 0640   VLDL 30 08/03/2013 0640   LDLCALC 104 (H) 08/03/2013 0640    Additional studies/ records that were reviewed today include:  None    ASSESSMENT:    1. On amiodarone therapy   2. Paroxysmal A-fib ([place])   3. Chronic diastolic heart failure ([place])   4. Hypertension, essential   5. Chronic anticoagulation      PLAN:  In order of problems listed above:  1. Amiodarone has been discontinued and now at least 3 months off the medication he has no recurrence of atrial fibrillation.  We will keep the patient off of amiodarone going forward. 2. No recurrence of atrial fibrillation.  Continue anticoagulation therapy for a while longer yet and may be indefinitely.  Hopefully treating sleep apnea has removed the trigger that was causing atrial fibrillation in the past. 3. No evidence of volume overload. 4. Borderline elevated blood pressure.  Discussed low-salt diet and weight loss.  Clinical follow-up 6 months.  EKG at that time.  Other than discontinuation of amiodarone now for greater than 3 months, no medication changes made.  Medication Adjustments/Labs and Tests Ordered: Current medicines are reviewed at length with the patient today.  Concerns regarding medicines are outlined above.  Medication changes, Labs and Tests ordered today are listed in the Patient Instructions below. Patient Instructions  Medication Instructions:  Your physician recommends that you continue on your current medications as directed. Please refer to the Current Medication list given to you today.  Labwork: None  Testing/Procedures: None  Follow-Up: Your physician wants you to follow-up in: 6 months with Dr. [name] with an EKG.  You will receive a reminder letter in the mail two months in advance. If you don't receive a letter, please call our office to schedule the follow-up appointment.   Any  Other Special Instructions Will Be Listed Below (If Applicable).     If you need a refill on your cardiac medications before your next appointment, please call your pharmacy.      Signed, [name], MD  12/02/2017 2:44 PM    [facility] [facility] [address], [place], [place]  [address] Phone: [phone]; Fax: [phone]

## 2018-04-10 ENCOUNTER — Other Ambulatory Visit: Payer: Self-pay

## 2018-04-10 DIAGNOSIS — I5031 Acute diastolic (congestive) heart failure: Secondary | ICD-10-CM

## 2018-04-10 MED ORDER — DILTIAZEM HCL ER COATED BEADS 240 MG PO CP24: 240.0000 mg | ORAL_CAPSULE | Freq: Every day | ORAL | 0 refills | Status: DC

## 2018-04-10 MED ORDER — POTASSIUM CHLORIDE CRYS ER 20 MEQ PO TBCR: 20.0000 meq | EXTENDED_RELEASE_TABLET | Freq: Two times a day (BID) | ORAL | 0 refills | Status: DC

## 2018-05-08 ENCOUNTER — Other Ambulatory Visit: Payer: Self-pay

## 2018-05-08 DIAGNOSIS — I5031 Acute diastolic (congestive) heart failure: Secondary | ICD-10-CM

## 2018-05-08 MED ORDER — POTASSIUM CHLORIDE CRYS ER 20 MEQ PO TBCR: 20.0000 meq | EXTENDED_RELEASE_TABLET | Freq: Two times a day (BID) | ORAL | 6 refills | Status: DC

## 2018-05-08 MED ORDER — DILTIAZEM HCL ER COATED BEADS 240 MG PO CP24: 240.0000 mg | ORAL_CAPSULE | Freq: Every day | ORAL | 2 refills | Status: DC

## 2018-05-12 ENCOUNTER — Ambulatory Visit: Payer: Managed Care, Other (non HMO) | Admitting: Cardiology

## 2018-05-12 ENCOUNTER — Encounter: Payer: Self-pay | Admitting: Cardiology

## 2018-05-12 ENCOUNTER — Telehealth: Payer: Self-pay | Admitting: *Deleted

## 2018-05-12 VITALS — BP 158/88 | HR 91 | Ht 71.0 in | Wt 318.8 lb

## 2018-05-12 DIAGNOSIS — I10 Essential (primary) hypertension: Secondary | ICD-10-CM

## 2018-05-12 DIAGNOSIS — G4733 Obstructive sleep apnea (adult) (pediatric): Secondary | ICD-10-CM | POA: Diagnosis not present

## 2018-05-12 NOTE — Telephone Encounter (Signed)
Order placed to [facility] today. 

## 2018-05-12 NOTE — Telephone Encounter (Signed)
-----   Message from [place], RN sent at 05/12/2018  1:20 PM EDT ----- Regarding: dme order  dme order placed   Thanks  Rena

## 2018-05-12 NOTE — Patient Instructions (Signed)
Medication Instructions:  Your physician recommends that you continue on your current medications as directed. Please refer to the Current Medication list given to you today.  If you need a refill on your cardiac medications, please contact your pharmacy first.  Labwork: None ordered   Testing/Procedures: None ordered   Follow-Up: Your physician recommends that you schedule a follow-up appointment in: 10 weeks with Dr. Radford Pax   Any Other Special Instructions Will Be Listed Below (If Applicable). Your physician has ordered you a new CPAP with auto-titration 4-18 cm water pressure and a chin strap.  Thank you for choosing [facility], RN  [phone]  If you need a refill on your cardiac medications before your next appointment, please call your pharmacy.

## 2018-05-12 NOTE — Telephone Encounter (Signed)
Resmed CPAP wit auto-titration from 4-18 cm water pressure and chin strap

## 2018-05-12 NOTE — Progress Notes (Signed)
Cardiology Office Note:    Date:  05/12/2018   ID:  Nicholas Foley, DOB October 04, 1951, MRN [id number]  PCP:  [name], MD  Cardiologist:  [name], MD    Referring MD: [name], MD   Chief Complaint  Patient presents with  . Sleep Apnea  . Hypertension    History of Present Illness:    Nicholas Foley is a 67 y.o. male with a hx of severe obstructive sleep apnea with an AHI of 74/h on PSG now on CPAP at 8 cm H2O.He is doing well with his CPAP device.  He tolerates the mask sometimes feels like he is not getting adequate pressure.  He continues to feel rested in the am and has no significant daytime sleepiness.  Lips well at night and uses his device religiously every night.  He has been having some problems with dry mouth recently.[name]  He does not think that he snores.     Past Medical History:  Diagnosis Date  . Acute diastolic heart failure ([place]) 08/02/2013   BNP greater than 2000   . Atrial fibrillation with rapid ventricular response ([place]) 08/02/2013  . COPD (chronic obstructive pulmonary disease) (HCC)    Moderate by PFTs with response to bronchodilators, May 2014  . Erectile dysfunction   . Hypertension   . Insomnia 08/02/13  . Left bundle branch block 08/02/2013  . Morbid obesity ([place]) 08/02/2013  . OSA (obstructive sleep apnea)    severe OSA with AHI 74/hr now on CPAP at 8cm H2O  . Snoring 08/02/2013   Suspect sleep apnea     Past Surgical History:  Procedure Laterality Date  . CARDIOVERSION N/A 08/06/2013   Procedure: CARDIOVERSION;  Surgeon: Lelon Perla, MD;  Location: [facility] ENDOSCOPY;  Service: Cardiovascular;  Laterality: N/A;  spoke with [name]   . CARDIOVERSION N/A 09/03/2013   Procedure: CARDIOVERSION;  Surgeon: [name], MD;  Location: [place];  Service: Cardiovascular;  Laterality: N/A;  . NASAL SEPTUM SURGERY    . TEE WITHOUT CARDIOVERSION N/A 08/06/2013   Procedure: TRANSESOPHAGEAL ECHOCARDIOGRAM (TEE);  Surgeon: Lelon Perla, MD;   Location: [facility] ENDOSCOPY;  Service: Cardiovascular;  Laterality: N/A;  . VASECTOMY      Current Medications: Current Meds  Medication Sig  . acetaminophen (TYLENOL) 650 MG CR tablet Take 650 mg by mouth every 8 (eight) hours as needed for pain.  [name] apixaban (ELIQUIS) 5 MG TABS tablet Take 1 tablet (5 mg total) by mouth 2 (two) times daily.  . Cholecalciferol (VITAMIN D PO) Take 1 tablet by mouth daily.  [name] CIALIS 20 MG tablet Take 20 mg by mouth daily as needed for erectile dysfunction.   [name] diltiazem (CARTIA XT) 240 MG 24 hr capsule Take 1 capsule (240 mg total) by mouth daily.  . fluticasone (FLONASE) 50 MCG/ACT nasal spray Place 1 spray into both nostrils daily.  . Fluticasone-Salmeterol (ADVAIR) 250-50 MCG/DOSE AEPB Inhale 1 puff into the lungs 2 (two) times daily.   . furosemide (LASIX) 40 MG tablet take 2 tablets by mouth twice a day  . halobetasol (ULTRAVATE) 0.05 % cream Apply 1 application topically daily.  . Omeprazole Magnesium (PRILOSEC OTC PO) Take by mouth daily. Heartburn..the patient unsure of dosage .  . potassium chloride SA (K-DUR,KLOR-CON) 20 MEQ tablet Take 1 tablet (20 mEq total) by mouth 2 (two) times daily.  . vitamin B-12 (CYANOCOBALAMIN) 1000 MCG tablet Take 1,000 mcg by mouth daily.  [name] zolpidem (AMBIEN CR)  12.5 MG CR tablet Take 1 tablet by mouth at bedtime as needed. For sleep     Allergies:   Patient has no known allergies.   Social History   Socioeconomic History  . Marital status: Married    Spouse name: Not on file  . Number of children: Not on file  . Years of education: Not on file  . Highest education level: Not on file  Occupational History  . Occupation: real estate    Comment: investment  Social Needs  . Financial resource strain: Not on file  . Food insecurity:    Worry: Not on file    Inability: Not on file  . Transportation needs:    Medical: Not on file    Non-medical: Not on file  Tobacco Use  . Smoking status: Former Smoker     Packs/day: 1.00    Years: 30.00    Pack years: 30.00    Types: Cigarettes, Cigars    Last attempt to quit: 08/02/2012    Years since quitting: 5.7  . Smokeless tobacco: Never Used  Substance and Sexual Activity  . Alcohol use: Yes    Alcohol/week: 0.0 oz    Comment: once a week; previously 3-4 times per week   . Drug use: No  . Sexual activity: Yes  Lifestyle  . Physical activity:    Days per week: Not on file    Minutes per session: Not on file  . Stress: Not on file  Relationships  . Social connections:    Talks on phone: Not on file    Gets together: Not on file    Attends religious service: Not on file    Active member of club or organization: Not on file    Attends meetings of clubs or organizations: Not on file    Relationship status: Not on file  Other Topics Concern  . Not on file  Social History Narrative   Works as a Engineer, structural for apartments   Lives with wife.  They have one grown daughter   Highest level of education:  college     Family History: The patient's family history includes Atrial fibrillation in his mother; COPD in his father; Cerebral aneurysm in his brother; Healthy in his daughter.  ROS:   Please see the history of present illness.    ROS  All other systems reviewed and negative.   EKGs/Labs/Other Studies Reviewed:    The following studies were reviewed today: PAP download  EKG:  EKG is not ordered today.    Recent Labs: No results found for requested labs within last 8760 hours.   Recent Lipid Panel    Component Value Date/Time   CHOL 184 08/03/2013 0640   TRIG 149 08/03/2013 0640   HDL 50 08/03/2013 0640   CHOLHDL 3.7 08/03/2013 0640   VLDL 30 08/03/2013 0640   LDLCALC 104 (H) 08/03/2013 0640    Physical Exam:    VS:  BP (!) 158/88   Pulse 91   Ht 5\' 11"  (1.803 m)   Wt (!) 318 lb 12.8 oz (144.6 kg)   SpO2 93%   BMI 44.46 kg/m     Wt Readings from Last 3 Encounters:  05/12/18 (!) 318 lb 12.8 oz (144.6 kg)    12/02/17 (!) 325 lb (147.4 kg)  09/09/17 (!) 323 lb (146.5 kg)     GEN:  Well nourished, well developed in no acute distress HEENT: Normal NECK: No JVD; No carotid bruits LYMPHATICS: No  lymphadenopathy CARDIAC: RRR, no murmurs, rubs, gallops RESPIRATORY:  Clear to auscultation without rales, wheezing or rhonchi  ABDOMEN: Soft, non-tender, non-distended MUSCULOSKELETAL:  No edema; No deformity  SKIN: Warm and dry NEUROLOGIC:  Alert and oriented x 3 PSYCHIATRIC:  Normal affect   ASSESSMENT:    1. OSA (obstructive sleep apnea)   2. Hypertension, essential   3. Morbid obesity ([place])    PLAN:    In order of problems listed above:  1.  OSA - the patient is tolerating PAP therapy well without any problems.  The patient has been using and benefiting from PAP use and will continue to benefit from therapy.  His machine is more than 65 years old so I am going to order a new ResMed CPAP and placed on auto from 4 to 18 cm H2O to see what pressure we need to set him at.  I will also order a chinstrap to help control his mouth breathing.  2.  HTN - BP is borderline controlled on exam today.  He will continue on Cartia XT 240mg  daily.  Encouraged him to watch the sodium in his diet.  3.  Obesity - I have encouraged him to get into a routine exercise program and cut back on carbs and portions.    Medication Adjustments/Labs and Tests Ordered: Current medicines are reviewed at length with the patient today.  Concerns regarding medicines are outlined above.  No orders of the defined types were placed in this encounter.  No orders of the defined types were placed in this encounter.   Signed, Fransico Him, MD  05/12/2018 1:05 PM    [place]

## 2018-05-15 NOTE — Telephone Encounter (Signed)
Per [facility] Nicholas Foley) patient is not eligible to receive a new CPAP until December 17/19. Nicholas Foley will contact the patient and let him know when he will order his cpap at that time.

## 2018-06-16 ENCOUNTER — Encounter (INDEPENDENT_AMBULATORY_CARE_PROVIDER_SITE_OTHER): Payer: 59 | Admitting: Ophthalmology

## 2018-06-16 DIAGNOSIS — D1809 Hemangioma of other sites: Secondary | ICD-10-CM

## 2018-06-16 DIAGNOSIS — H35033 Hypertensive retinopathy, bilateral: Secondary | ICD-10-CM

## 2018-06-16 DIAGNOSIS — H353122 Nonexudative age-related macular degeneration, left eye, intermediate dry stage: Secondary | ICD-10-CM | POA: Diagnosis not present

## 2018-06-16 DIAGNOSIS — I10 Essential (primary) hypertension: Secondary | ICD-10-CM

## 2018-06-16 DIAGNOSIS — H43813 Vitreous degeneration, bilateral: Secondary | ICD-10-CM

## 2018-06-16 DIAGNOSIS — H2513 Age-related nuclear cataract, bilateral: Secondary | ICD-10-CM

## 2018-06-16 DIAGNOSIS — H33302 Unspecified retinal break, left eye: Secondary | ICD-10-CM

## 2018-07-31 ENCOUNTER — Ambulatory Visit: Payer: Managed Care, Other (non HMO) | Admitting: Cardiology

## 2018-08-11 ENCOUNTER — Ambulatory Visit: Payer: Self-pay | Admitting: Interventional Cardiology

## 2018-09-17 NOTE — Progress Notes (Signed)
Cardiology Office Note:    Date:  09/18/2018   ID:  Nicholas Foley, DOB 08-31-1951, MRN [id number]  PCP:  Nicholas Small, MD  Cardiologist:  Nicholas Grooms, MD   Referring MD: Nicholas Small, MD   Chief Complaint  Patient presents with  . Atrial Fibrillation  . Abnormal ECG    Left bundle branch block    History of Present Illness:    Nicholas Foley is a 67 y.o. male with a hx of who presents for paroxysmal atrial fibrillation, amiodarone therapy, obstructive sleep apnea, hypertension, chronic anticoagulation (Apixaban), chronic diastolic heart failure, morbid obesity, and left bundle branch block.   Nicholas Foley is having some dyspnea on exertion.  There has been occasional orthopnea.  He is no longer exercising because he feels tired all the time and says that he just cannot exercise.  Says he is aching and having muscle pain all the time.  He is not on statin therapy.  He has back pain in addition to the muscle pain.  There is been some wheezing and concerned that this could be respiratory related problem.  He awakens feeling fatigued.  He is using CPAP on a regular basis.  He does wake up at night short of breath.  His machine occasionally stops working.  In reviewing prior records, do note that he had left bundle when in atrial fibrillation in 2014 which resolved with rate/rhythm control.   Past Medical History:  Diagnosis Date  . Acute diastolic heart failure ([place]) 08/02/2013   BNP greater than 2000   . Atrial fibrillation with rapid ventricular response ([place]) 08/02/2013  . COPD (chronic obstructive pulmonary disease) (HCC)    Moderate by PFTs with response to bronchodilators, May 2014  . Erectile dysfunction   . Hypertension   . Insomnia 08/02/13  . Left bundle branch block 08/02/2013  . Morbid obesity ([place]) 08/02/2013  . OSA (obstructive sleep apnea)    severe OSA with AHI 74/hr now on CPAP at 8cm H2O  . Snoring 08/02/2013   Suspect sleep apnea     Past Surgical History:    Procedure Laterality Date  . CARDIOVERSION N/A 08/06/2013   Procedure: CARDIOVERSION;  Surgeon: Nicholas Perla, MD;  Location: [facility] ENDOSCOPY;  Service: Cardiovascular;  Laterality: N/A;  spoke with Nicholas Foley   . CARDIOVERSION N/A 09/03/2013   Procedure: CARDIOVERSION;  Surgeon: Nicholas Grooms, MD;  Location: [place];  Service: Cardiovascular;  Laterality: N/A;  . NASAL SEPTUM SURGERY    . TEE WITHOUT CARDIOVERSION N/A 08/06/2013   Procedure: TRANSESOPHAGEAL ECHOCARDIOGRAM (TEE);  Surgeon: Nicholas Perla, MD;  Location: [facility] ENDOSCOPY;  Service: Cardiovascular;  Laterality: N/A;  . VASECTOMY      Current Medications: Current Meds  Medication Sig  . acetaminophen (TYLENOL) 650 MG CR tablet Take 650 mg by mouth every 8 (eight) hours as needed for pain.  [name] apixaban (ELIQUIS) 5 MG TABS tablet Take 1 tablet (5 mg total) by mouth 2 (two) times daily.  . Cholecalciferol (VITAMIN D PO) Take 1 tablet by mouth daily.  [name] CIALIS 20 MG tablet Take 20 mg by mouth daily as needed for erectile dysfunction.   [name] diltiazem (CARTIA XT) 240 MG 24 hr capsule Take 1 capsule (240 mg total) by mouth daily.  . fluticasone (FLONASE) 50 MCG/ACT nasal spray Place 1 spray into both nostrils daily.  . Fluticasone-Salmeterol (ADVAIR) 250-50 MCG/DOSE AEPB Inhale 1 puff into the lungs 2 (two) times daily.   . furosemide (LASIX)  40 MG tablet take 2 tablets by mouth twice a day  . halobetasol (ULTRAVATE) 0.05 % cream Apply 1 application topically daily.  . Omeprazole Magnesium (PRILOSEC OTC PO) Take by mouth daily. Heartburn..the patient unsure of dosage .  . potassium chloride SA (K-DUR,KLOR-CON) 20 MEQ tablet Take 1 tablet (20 mEq total) by mouth 2 (two) times daily.  . vitamin B-12 (CYANOCOBALAMIN) 1000 MCG tablet Take 1,000 mcg by mouth daily.  [name] zolpidem (AMBIEN CR) 12.5 MG CR tablet Take 1 tablet by mouth at bedtime as needed. For sleep     Allergies:   Patient has no known allergies.   Social History    Socioeconomic History  . Marital status: Married    Spouse name: Not on file  . Number of children: Not on file  . Years of education: Not on file  . Highest education level: Not on file  Occupational History  . Occupation: real estate    Comment: investment  Social Needs  . Financial resource strain: Not on file  . Food insecurity:    Worry: Not on file    Inability: Not on file  . Transportation needs:    Medical: Not on file    Non-medical: Not on file  Tobacco Use  . Smoking status: Former Smoker    Packs/day: 1.00    Years: 30.00    Pack years: 30.00    Types: Cigarettes, Cigars    Last attempt to quit: 08/02/2012    Years since quitting: 6.1  . Smokeless tobacco: Never Used  Substance and Sexual Activity  . Alcohol use: Yes    Alcohol/week: 0.0 standard drinks    Comment: once a week; previously 3-4 times per week   . Drug use: No  . Sexual activity: Yes  Lifestyle  . Physical activity:    Days per week: Not on file    Minutes per session: Not on file  . Stress: Not on file  Relationships  . Social connections:    Talks on phone: Not on file    Gets together: Not on file    Attends religious service: Not on file    Active member of club or organization: Not on file    Attends meetings of clubs or organizations: Not on file    Relationship status: Not on file  Other Topics Concern  . Not on file  Social History Narrative   Works as a Engineer, structural for apartments   Lives with wife.  They have one grown daughter   Highest level of education:  college     Family History: The patient's family history includes Atrial fibrillation in his mother; COPD in his father; Cerebral aneurysm in his brother; Healthy in his daughter.  ROS:   Please see the history of present illness.    Muscle pain, wheezing, back pain, decreased hearing, feeling of exhaustion.  No longer exercising.  All other systems reviewed and are negative.  EKGs/Labs/Other Studies  Reviewed:    The following studies were reviewed today: No recent functional data.  EKG:  EKG is  ordered today.  The ekg ordered today demonstrates left bundle branch block, normal sinus rhythm, left axis deviation.  When compared to the prior tracing from January 2019, the bundle branch block is new.  Recent Labs: No results found for requested labs within last 8760 hours.  Recent Lipid Panel    Component Value Date/Time   CHOL 184 08/03/2013 0640   TRIG 149 08/03/2013 0640  HDL 50 08/03/2013 0640   CHOLHDL 3.7 08/03/2013 0640   VLDL 30 08/03/2013 0640   LDLCALC 104 (H) 08/03/2013 0640    Physical Exam:    VS:  BP 140/78   Pulse 79   Ht 5\' 11"  (1.803 m)   Wt (!) 324 lb 6.4 oz (147.1 kg)   BMI 45.24 kg/m     Wt Readings from Last 3 Encounters:  09/18/18 (!) 324 lb 6.4 oz (147.1 kg)  05/12/18 (!) 318 lb 12.8 oz (144.6 kg)  12/02/17 (!) 325 lb (147.4 kg)     GEN: Morbid obesity.  Well developed in no acute distress HEENT: Normal NECK: No JVD. LYMPHATICS: No lymphadenopathy CARDIAC: RRR, no murmur, no gallop, no  edema. VASCULAR: 2+ bilateral radial and carotid pulses.  No bruits. RESPIRATORY:  Clear to auscultation without rales, wheezing or rhonchi  ABDOMEN: Soft, non-tender, non-distended, No pulsatile mass, MUSCULOSKELETAL: No deformity  SKIN: Warm and dry NEUROLOGIC:  Alert and oriented x 3 PSYCHIATRIC:  Normal affect   ASSESSMENT:    1. Paroxysmal A-fib ([place])   2. Left bundle branch block   3. Chronic diastolic heart failure ([place])   4. Unilateral emphysema ([place])   5. Chronic anticoagulation   6. OSA (obstructive sleep apnea)   7. On amiodarone therapy    PLAN:    In order of problems listed above:  1. No obvious clinical recurrence of atrial fibrillation now off amiodarone for greater than 9 months.  He denies palpitations. CHADS VASC IS 3. 2. Left bundle has recurred, and is currently not rate related.  5 years ago he had left bundle in the setting  of A. fib with RVR that went away with management of the atrial fibrillation. 3. With increasing dyspnea, orthopnea, and exertional fatigue in conjunction with a left bundle, we need to rule out progression to systolic dysfunction as a possible source of his current dyspnea and fatigue.  Adjustment of therapy especially diltiazem if LV dysfunction is noted. 4. Chronic lung disease is being managed by Dr. Justin Mend.  5. No bleeding complications on apixaban. 6. Encouraged compliance with CPAP.  He is going to have to have his equipment updated.  I encouraged him to do this quickly. 7. Amiodarone has been discontinued.  Assess LV function.  May need a BNP at some point in the future if LV function is down.  If LV function is down, we may need to add beta-blocker instead of diltiazem, ACE/arb/arni, and diuretic.  2D Doppler echocardiogram will help [place] decide.  Plan currently is to continue long-term anticoagulation because he is at high risk for recurrent episodes of A. Fib.  Greater than 50% of the time during this office visit was spent in education, counseling, and coordination of care related to underlying disease process and testing as outlined.    Medication Adjustments/Labs and Tests Ordered: Current medicines are reviewed at length with the patient today.  Concerns regarding medicines are outlined above.  Orders Placed This Encounter  Procedures  . EKG 12-Lead  . ECHOCARDIOGRAM COMPLETE   No orders of the defined types were placed in this encounter.   Patient Instructions  Medication Instructions:  Your physician recommends that you continue on your current medications as directed. Please refer to the Current Medication list given to you today.  If you need a refill on your cardiac medications before your next appointment, please call your pharmacy.   Lab work: none If you have labs (blood work) drawn today and your  tests are completely normal, you will receive your results only  by: [name] MyChart Message (if you have MyChart) OR . A paper copy in the mail If you have any lab test that is abnormal or we need to change your treatment, we will call you to review the results.  Testing/Procedures: Your physician has requested that you have an echocardiogram. Echocardiography is a painless test that uses sound waves to create images of your heart. It provides your doctor with information about the size and shape of your heart and how well your heart's chambers and valves are working. This procedure takes approximately one hour. There are no restrictions for this procedure.    Follow-Up: At [facility], you and your health needs are our priority.  As part of our continuing mission to provide you with exceptional heart care, we have created designated Provider Care Teams.  These Care Teams include your primary Cardiologist (physician) and Advanced Practice Providers (APPs -  Physician Assistants and Nurse Practitioners) who all work together to provide you with the care you need, when you need it. You will need a follow up appointment in 9-12 months.  Please call our office 2 months in advance to schedule this appointment.  You may see Nicholas Grooms, MD or one of the following Advanced Practice Providers on your designated Care Team:   [name], NP [name], NP . [name], NP  Any Other Special Instructions Will Be Listed Below (If Applicable).        Signed, Nicholas Grooms, MD  09/18/2018 10:53 AM    [place]

## 2018-09-18 ENCOUNTER — Encounter: Payer: Self-pay | Admitting: Interventional Cardiology

## 2018-09-18 ENCOUNTER — Ambulatory Visit: Payer: Managed Care, Other (non HMO) | Admitting: Interventional Cardiology

## 2018-09-18 VITALS — BP 140/78 | HR 79 | Ht 71.0 in | Wt 324.4 lb

## 2018-09-18 DIAGNOSIS — Z79899 Other long term (current) drug therapy: Secondary | ICD-10-CM

## 2018-09-18 DIAGNOSIS — I480 Paroxysmal atrial fibrillation: Secondary | ICD-10-CM

## 2018-09-18 DIAGNOSIS — I5032 Chronic diastolic (congestive) heart failure: Secondary | ICD-10-CM | POA: Diagnosis not present

## 2018-09-18 DIAGNOSIS — I447 Left bundle-branch block, unspecified: Secondary | ICD-10-CM

## 2018-09-18 DIAGNOSIS — J430 Unilateral pulmonary emphysema [MacLeod's syndrome]: Secondary | ICD-10-CM

## 2018-09-18 DIAGNOSIS — Z7901 Long term (current) use of anticoagulants: Secondary | ICD-10-CM

## 2018-09-18 DIAGNOSIS — G4733 Obstructive sleep apnea (adult) (pediatric): Secondary | ICD-10-CM

## 2018-09-18 NOTE — Patient Instructions (Signed)
Medication Instructions:  Your physician recommends that you continue on your current medications as directed. Please refer to the Current Medication list given to you today.  If you need a refill on your cardiac medications before your next appointment, please call your pharmacy.   Lab work: none If you have labs (blood work) drawn today and your tests are completely normal, you will receive your results only by: [name] MyChart Message (if you have MyChart) OR . A paper copy in the mail If you have any lab test that is abnormal or we need to change your treatment, we will call you to review the results.  Testing/Procedures: Your physician has requested that you have an echocardiogram. Echocardiography is a painless test that uses sound waves to create images of your heart. It provides your doctor with information about the size and shape of your heart and how well your heart's chambers and valves are working. This procedure takes approximately one hour. There are no restrictions for this procedure.    Follow-Up: At [facility], you and your health needs are our priority.  As part of our continuing mission to provide you with exceptional heart care, we have created designated Provider Care Teams.  These Care Teams include your primary Cardiologist (physician) and Advanced Practice Providers (APPs -  Physician Assistants and Nurse Practitioners) who all work together to provide you with the care you need, when you need it. You will need a follow up appointment in 9-12 months.  Please call our office 2 months in advance to schedule this appointment.  You may see [name], MD or one of the following Advanced Practice Providers on your designated Care Team:   [name], NP [name], NP . [name], NP  Any Other Special Instructions Will Be Listed Below (If Applicable).

## 2018-09-22 ENCOUNTER — Telehealth: Payer: Self-pay | Admitting: *Deleted

## 2018-09-22 ENCOUNTER — Ambulatory Visit (HOSPITAL_COMMUNITY): Payer: Managed Care, Other (non HMO) | Attending: Cardiovascular Disease

## 2018-09-22 ENCOUNTER — Other Ambulatory Visit: Payer: Self-pay

## 2018-09-22 DIAGNOSIS — I5032 Chronic diastolic (congestive) heart failure: Secondary | ICD-10-CM

## 2018-09-22 DIAGNOSIS — R5383 Other fatigue: Secondary | ICD-10-CM

## 2018-09-22 DIAGNOSIS — I480 Paroxysmal atrial fibrillation: Secondary | ICD-10-CM | POA: Insufficient documentation

## 2018-09-22 DIAGNOSIS — I447 Left bundle-branch block, unspecified: Secondary | ICD-10-CM | POA: Insufficient documentation

## 2018-09-22 MED ORDER — PERFLUTREN LIPID MICROSPHERE
1.0000 mL | INTRAVENOUS | Status: AC | PRN
  Administered 2018-09-22: 3 mL via INTRAVENOUS

## 2018-09-22 NOTE — Telephone Encounter (Signed)
-----   Message from Belva Crome, MD sent at 09/22/2018  4:03 PM EDT ----- Let the patient know the heart pumping function has decreased significantly.  EF is now 35 to 40% down from 50 to 55% when last evaluated.  Decreased pumping action could be causing him to feel short of breath and fatigue.  He needs to discontinue diltiazem and start Entresto 24/26 mg twice daily.  Before he starts the [facility] he needs to get a basic metabolic panel, TSH, and CBC.  He needs to have an evaluation with EP to consider resynchronization therapy.  Would prefer Dr. Lovena Le. A copy will be sent to [name], MD

## 2018-09-22 NOTE — Telephone Encounter (Signed)
Spoke with pt and made him aware of results and recommendations per Dr. [name].  Pt will come for labs on Monday.  Advised pt to pick up [facility] card in the event that we do start the medication.  Advised I will place referral for EP consult as well.  Pt verbalized understanding and was in agreement with this plan.

## 2018-09-25 ENCOUNTER — Other Ambulatory Visit: Payer: Managed Care, Other (non HMO) | Admitting: *Deleted

## 2018-09-25 DIAGNOSIS — I480 Paroxysmal atrial fibrillation: Secondary | ICD-10-CM

## 2018-09-25 DIAGNOSIS — R5383 Other fatigue: Secondary | ICD-10-CM

## 2018-09-25 DIAGNOSIS — I5032 Chronic diastolic (congestive) heart failure: Secondary | ICD-10-CM

## 2018-09-25 LAB — CBC
HEMATOCRIT: 39.4 % (ref 37.5–51.0)
Hemoglobin: 13.9 g/dL (ref 13.0–17.7)
MCH: 31 pg (ref 26.6–33.0)
MCHC: 35.3 g/dL (ref 31.5–35.7)
MCV: 88 fL (ref 79–97)
PLATELETS: 241 10*3/uL (ref 150–450)
RBC: 4.48 x10E6/uL (ref 4.14–5.80)
RDW: 12.8 % (ref 12.3–15.4)
WBC: 7 10*3/uL (ref 3.4–10.8)

## 2018-09-25 LAB — BASIC METABOLIC PANEL
BUN/Creatinine Ratio: 11 (ref 10–24)
BUN: 12 mg/dL (ref 8–27)
CALCIUM: 9 mg/dL (ref 8.6–10.2)
CHLORIDE: 100 mmol/L (ref 96–106)
CO2: 23 mmol/L (ref 20–29)
CREATININE: 1.14 mg/dL (ref 0.76–1.27)
GFR calc Af Amer: 78 mL/min/{1.73_m2} (ref >59)
GFR calc non Af Amer: 67 mL/min/{1.73_m2} (ref >59)
Glucose: 113 mg/dL — ABNORMAL HIGH (ref 65–99)
Potassium: 3.6 mmol/L (ref 3.5–5.2)
Sodium: 141 mmol/L (ref 134–144)

## 2018-09-25 LAB — TSH: TSH: 1.5 u[IU]/mL (ref 0.450–4.500)

## 2018-09-26 ENCOUNTER — Telehealth: Payer: Self-pay | Admitting: *Deleted

## 2018-09-26 DIAGNOSIS — I5032 Chronic diastolic (congestive) heart failure: Secondary | ICD-10-CM

## 2018-09-26 MED ORDER — SACUBITRIL-VALSARTAN 24-26 MG PO TABS: 1.0000 | ORAL_TABLET | Freq: Two times a day (BID) | ORAL | 11 refills | Status: DC

## 2018-09-26 NOTE — Telephone Encounter (Signed)
-----   Message from Belva Crome, MD sent at 09/22/2018  4:03 PM EDT ----- Let the patient know the heart pumping function has decreased significantly.  EF is now 35 to 40% down from 50 to 55% when last evaluated.  Decreased pumping action could be causing him to feel short of breath and fatigue.  He needs to discontinue diltiazem and start Entresto 24/26 mg twice daily.  Before he starts the [facility] he needs to get a basic metabolic panel, TSH, and CBC.  He needs to have an evaluation with EP to consider resynchronization therapy.  Would prefer Dr. Lovena Le. A copy will be sent to [name], MD

## 2018-09-26 NOTE — Telephone Encounter (Signed)
Notes recorded by Belva Crome, MD on 09/26/2018 at 3:10 PM EST Please start 24/26 mg p.o. twice daily. Discontinue diltiazem. 1 week after starting Entresto, if stable without dyspnea or other complaints we should start carvedilol 3.125 mg twice daily. He needs an office visit to see me in 2 weeks. Needs an EKG at that office visit. He needs a basic metabolic panel 1 week after starting Entresto.  Spoke with pt and went over recommendations per Dr. [name].  Pt will have labs drawn 11/13 when he sees Dr. [name].  Scheduled pt to see Dr. [name] 11/18.  Pt aware to d/c Diltiazem.  Pt verbalized understanding and was in agreement with this plan.

## 2018-10-04 ENCOUNTER — Ambulatory Visit (INDEPENDENT_AMBULATORY_CARE_PROVIDER_SITE_OTHER): Payer: Managed Care, Other (non HMO)

## 2018-10-04 ENCOUNTER — Ambulatory Visit: Payer: Managed Care, Other (non HMO) | Admitting: Internal Medicine

## 2018-10-04 ENCOUNTER — Encounter: Payer: Self-pay | Admitting: Internal Medicine

## 2018-10-04 ENCOUNTER — Other Ambulatory Visit: Payer: Managed Care, Other (non HMO) | Admitting: *Deleted

## 2018-10-04 VITALS — BP 134/84 | HR 87 | Ht 71.0 in | Wt 320.0 lb

## 2018-10-04 DIAGNOSIS — I5032 Chronic diastolic (congestive) heart failure: Secondary | ICD-10-CM

## 2018-10-04 DIAGNOSIS — G4733 Obstructive sleep apnea (adult) (pediatric): Secondary | ICD-10-CM

## 2018-10-04 DIAGNOSIS — I447 Left bundle-branch block, unspecified: Secondary | ICD-10-CM

## 2018-10-04 DIAGNOSIS — I480 Paroxysmal atrial fibrillation: Secondary | ICD-10-CM

## 2018-10-04 LAB — BASIC METABOLIC PANEL
BUN/Creatinine Ratio: 13 (ref 10–24)
BUN: 16 mg/dL (ref 8–27)
CO2: 25 mmol/L (ref 20–29)
Calcium: 9.2 mg/dL (ref 8.6–10.2)
Chloride: 98 mmol/L (ref 96–106)
Creatinine, Ser: 1.2 mg/dL (ref 0.76–1.27)
GFR, EST AFRICAN AMERICAN: 73 mL/min/{1.73_m2} (ref >59)
GFR, EST NON AFRICAN AMERICAN: 63 mL/min/{1.73_m2} (ref >59)
Glucose: 99 mg/dL (ref 65–99)
POTASSIUM: 4.2 mmol/L (ref 3.5–5.2)
SODIUM: 139 mmol/L (ref 134–144)

## 2018-10-04 NOTE — Patient Instructions (Addendum)
Medication Instructions:  Your physician recommends that you continue on your current medications as directed. Please refer to the Current Medication list given to you today.  Labwork: None ordered.  Testing/Procedures: Your physician has recommended that you wear an event monitor. Event monitors are medical devices that record the heart's electrical activity. Doctors most often [place] these monitors to diagnose arrhythmias. Arrhythmias are problems with the speed or rhythm of the heartbeat. The monitor is a small, portable device. You can wear one while you do your normal daily activities. This is usually used to diagnose what is causing palpitations/syncope (passing out).  Please schedule for a 30 day monitor.  (one of the new types of monitors)  Follow-Up: Your physician wants you to follow-up in: 2 months with Dr. [name].      Any Other Special Instructions Will Be Listed Below (If Applicable).  If you need a refill on your cardiac medications before your next appointment, please call your pharmacy.

## 2018-10-04 NOTE — Progress Notes (Signed)
Electrophysiology Office Note   Date:  10/04/2018   ID:  Nicholas FLEMMER, DOB 1951-07-18, MRN [id number]  PCP:  Nicholas Small, MD  Cardiologist:  Dr Nicholas Foley Primary Electrophysiologist: Nicholas Grayer, MD    CC: afib   History of Present Illness: Nicholas Foley is a 67 y.o. male who presents today for electrophysiology evaluation.   He is referred by Dr Nicholas Foley for EP consultation regarding afib.  He reports initially having atrial fibrillation in 2014.  He was placed on amiodarone for a short time.  He is unaware of afib since that time, but reports that he did not have much symptoms during his prior afib.  He has OSA for which he uses CPAP.  With afib, he has symptoms of fatigue and decreased exercise tolerance.  Today, he denies symptoms of palpitations, chest pain, shortness of breath, orthopnea, PND, lower extremity edema, claudication, dizziness, presyncope, syncope, bleeding, or neurologic sequela. The patient is tolerating medications without difficulties and is otherwise without complaint today.    Past Medical History:  Diagnosis Date  . Acute diastolic heart failure ([place]) 08/02/2013   BNP greater than 2000   . Atrial fibrillation with rapid ventricular response ([place]) 08/02/2013  . COPD (chronic obstructive pulmonary disease) (H[facility])    Moderate by PFTs with response to bronchodilators, May 2014  . Erectile dysfunction   . Hypertension   . Insomnia 08/02/13  . Left bundle branch block 08/02/2013  . Morbid obesity ([place]) 08/02/2013  . OSA (obstructive sleep apnea)    severe OSA with AHI 74/hr now on CPAP at 8cm H2O  . Snoring 08/02/2013   Suspect sleep apnea    Past Surgical History:  Procedure Laterality Date  . CARDIOVERSION N/A 08/06/2013   Procedure: CARDIOVERSION;  Surgeon: Lelon Perla, MD;  Location: [facility] ENDOSCOPY;  Service: Cardiovascular;  Laterality: N/A;  spoke with Nicholas Foley   . CARDIOVERSION N/A 09/03/2013   Procedure: CARDIOVERSION;  Surgeon: [name], MD;   Location: [place];  Service: Cardiovascular;  Laterality: N/A;  . NASAL SEPTUM SURGERY    . TEE WITHOUT CARDIOVERSION N/A 08/06/2013   Procedure: TRANSESOPHAGEAL ECHOCARDIOGRAM (TEE);  Surgeon: Lelon Perla, MD;  Location[facility] ENDOSCOPY;  Service: Cardiovascular;  Laterality: N/A;  . VASECTOMY       Current Outpatient Medications  Medication Sig Dispense Refill  . acetaminophen (TYLENOL) 650 MG CR tablet Take 650 mg by mouth every 8 (eight) hours as needed for pain.    [name] apixaban (ELIQUIS) 5 MG TABS tablet Take 1 tablet (5 mg total) by mouth 2 (two) times daily. 180 tablet 3  . Cholecalciferol (VITAMIN D PO) Take 1 tablet by mouth daily.    [name] CIALIS 20 MG tablet Take 20 mg by mouth daily as needed for erectile dysfunction.   0  . fluticasone (FLONASE) 50 MCG/ACT nasal spray Place 1 spray into both nostrils daily.  0  . Fluticasone-Salmeterol (ADVAIR) 250-50 MCG/DOSE AEPB Inhale 1 puff into the lungs 2 (two) times daily.     . furosemide (LASIX) 40 MG tablet take 2 tablets by mouth twice a day 360 tablet 3  . halobetasol (ULTRAVATE) 0.05 % cream Apply 1 application topically daily.    . Omeprazole Magnesium (PRILOSEC OTC PO) Take by mouth daily. Heartburn..the patient unsure of dosage .    . potassium chloride SA (K-DUR,KLOR-CON) 20 MEQ tablet Take 1 tablet (20 mEq total) by mouth 2 (two) times daily. 60 tablet 6  . sacubitril-valsartan (ENTRESTO) 24-26  MG Take 1 tablet by mouth 2 (two) times daily. 60 tablet 11  . vitamin B-12 (CYANOCOBALAMIN) 1000 MCG tablet Take 1,000 mcg by mouth daily.    [name] zolpidem (AMBIEN CR) 12.5 MG CR tablet Take 1 tablet by mouth at bedtime as needed. For sleep     No current facility-administered medications for this visit.     Allergies:   Patient has no known allergies.   Social History:  The patient  reports that he quit smoking about 6 years ago. His smoking use included cigarettes and cigars. He has a 30.00 pack-year smoking history. He has never  used smokeless tobacco. He reports that he drinks alcohol. He reports that he does not use drugs.   Family History:  The patient's  family history includes Atrial fibrillation in his mother; COPD in his father; Cerebral aneurysm in his brother; Healthy in his daughter.    ROS:  Please see the history of present illness.   All other systems are personally reviewed and negative.    PHYSICAL EXAM: VS:  BP 134/84   Pulse 87   Ht 5\' 11"  (1.803 m)   Wt (!) 320 lb (145.2 kg)   SpO2 98%   BMI 44.63 kg/m  , BMI Body mass index is 44.63 kg/m. GEN:  Overweight , in no acute distress  HEENT: normal  Neck: no JVD, carotid bruits, or masses Cardiac: RRR with frequent ectopy; no murmurs, rubs, or gallops,no edema  Respiratory:  clear to auscultation bilaterally, normal work of breathing GI: soft, nontender, nondistended, + BS MS: no deformity or atrophy  Skin: warm and dry  Neuro:  Strength and sensation are intact Psych: euthymic mood, full affect  EKG:  EKG is ordered today. The ekg ordered today is personally reviewed and shows sinus rhythm with frequent PACs, LBBB   Recent Labs: 09/25/2018: BUN 12; Creatinine, Ser 1.14; Hemoglobin 13.9; Platelets 241; Potassium 3.6; Sodium 141; TSH 1.500  personally reviewed   Lipid Panel     Component Value Date/Time   CHOL 184 08/03/2013 0640   TRIG 149 08/03/2013 0640   HDL 50 08/03/2013 0640   CHOLHDL 3.7 08/03/2013 0640   VLDL 30 08/03/2013 0640   LDLCALC 104 (H) 08/03/2013 0640   personally reviewed   Wt Readings from Last 3 Encounters:  10/04/18 (!) 320 lb (145.2 kg)  09/18/18 (!) 324 lb 6.4 oz (147.1 kg)  05/12/18 (!) 318 lb 12.8 oz (144.6 kg)      Other studies personally reviewed: Additional studies/ records that were reviewed today include: Dr Nicholas Caul notes  Review of the above records today demonstrates: echo 09/22/18 reveals EF 35-40%, moderate concentric LVH, LVEDD 57 mm, RA 53 mm, LA 43 mm with volume of 92 ml  ASSESSMENT  AND PLAN:  1.  Cardiomyopathy Unclear etiology I do not see that he has had recent CV risk stratification.  Would consider cath.   I will also obtain echo to see if he has afib which could be causing his CM.   Clinically improved with cessation of diltiazem and starting entresto. Dr Nicholas Foley to continue to optimize therapy.  Add beta blocker if COPD will allow.  I agree with Dr Nicholas Foley that LBBB cardiomyopathy could be the cause and that we should consider pacing at some point.  I do think that we need to exclude both CAD and afib prior to this.  Continue aggressive medicine titration.  If his EF does not improve, would consider CRT-P after 3 months of  medicine optimization.  2. Morbid obesity Body mass index is 44.63 kg/m. Lifestyle modification encouraged  3. OSA Compliance with CPAP encouraged  4. AFib As above Will obtain 30 day monitor  Follow-up:  With Dr Nicholas Foley as scheduled I will see in 2 months for further discussion  Current medicines are reviewed at length with the patient today.   The patient does not have concerns regarding his medicines.  The following changes were made today:  none  Labs/ tests ordered today include:  Orders Placed This Encounter  Procedures  . EKG 12-Lead     Signed, Nicholas Grayer, MD  10/04/2018 10:01 AM     [facility] [address] [place] [address] [phone] (office) [phone] (fax)

## 2018-10-06 ENCOUNTER — Telehealth: Payer: Self-pay | Admitting: *Deleted

## 2018-10-06 ENCOUNTER — Telehealth: Payer: Self-pay | Admitting: Internal Medicine

## 2018-10-06 NOTE — Telephone Encounter (Signed)
New Message   Josh with [employer] is calling to report a abnormal EKG

## 2018-10-06 NOTE — Telephone Encounter (Signed)
[facility] faxed an urgent report over on this pt from 11/14 at [place], showing the pt to be in sinus rhythm, then went into afib, with a rate of 90 bpm.  This was an auto-triggered event.  Attempted to call the pt to inquire if he had any symptoms or not. Pt did not answer, so left him a VM to call the office back.  Pt does have a history of afib, and is on Eliquis.  Will go and show DOD Dr. [place] Knack pts monitor report, per protocol.

## 2018-10-06 NOTE — Telephone Encounter (Signed)
Showed the pts event monitor to DOD Dr [place] Knack, and no new changes to be made at this time.  Continue to monitor and continue current med regimen.  Advised to route this message to both Dr [name] and Sonia Baller RN for further follow-up.  Pt was already aware that we will only call him back if changes needed to be made.

## 2018-10-08 NOTE — Progress Notes (Signed)
Cardiology Office Note:    Date:  10/09/2018   ID:  Nicholas Foley, DOB 02-13-1951, MRN [id number]  PCP:  [name], MD  Cardiologist:  [name], MD   Referring MD: [name], MD   Chief Complaint  Patient presents with  . Congestive Heart Failure  . Atrial Fibrillation    History of Present Illness:    Nicholas Foley is a 67 y.o. male with a hx of paroxysmal atrial fibrillation, amiodarone therapy, obstructive sleep apnea, hypertension,chronic anticoagulation (Apixaban),chronic combined systolic and diastolic heart failure, morbid obesity, and left bundle branch block.   Need to consider cardiac cath due to LV systolic dysfunction.[name]  He is not having angina.  Once I optimize heart failure therapy we will perform coronary angiography.  That may still be 4 to 6 weeks away.  If he has no coronary disease and after appropriate guideline directed medical therapy for systolic dysfunction, the left ventricle remains depressed, he may be a candidate for resynchronization therapy.   Since starting [facility] he feels better.  He does not have orthopnea.  No medication side effects.  Kidney function and potassium are stable after initiation of therapy.  Most recent potassium was 4.2.  This is with the patient on Entresto 24/26 mg twice daily and 20 mEq twice daily of K. Dur.  Past Medical History:  Diagnosis Date  . Atrial fibrillation with rapid ventricular response ([place]) 08/02/2013  . Chronic systolic dysfunction of left ventricle 08/02/2013   BNP greater than 2000   . COPD (chronic obstructive pulmonary disease) (H[facility])    Moderate by PFTs with response to bronchodilators, May 2014  . Erectile dysfunction   . Hypertension   . Insomnia 08/02/13  . Left bundle branch block 08/02/2013  . Morbid obesity ([place]) 08/02/2013  . OSA (obstructive sleep apnea)    severe OSA with AHI 74/hr now on CPAP at 8cm H2O    Past Surgical History:  Procedure Laterality Date  . CARDIOVERSION N/A  08/06/2013   Procedure: CARDIOVERSION;  Surgeon: Lelon Perla, MD;  Location: [facility] ENDOSCOPY;  Service: Cardiovascular;  Laterality: N/A;  spoke with [name]   . CARDIOVERSION N/A 09/03/2013   Procedure: CARDIOVERSION;  Surgeon: [name], MD;  Location: [facility];  Service: Cardiovascular;  Laterality: N/A;  . NASAL SEPTUM SURGERY    . TEE WITHOUT CARDIOVERSION N/A 08/06/2013   Procedure: TRANSESOPHAGEAL ECHOCARDIOGRAM (TEE);  Surgeon: Lelon Perla, MD;  Location: [facility] ENDOSCOPY;  Service: Cardiovascular;  Laterality: N/A;  . VASECTOMY      Current Medications: Current Meds  Medication Sig  . acetaminophen (TYLENOL) 650 MG CR tablet Take 650 mg by mouth every 8 (eight) hours as needed for pain.  [name] apixaban (ELIQUIS) 5 MG TABS tablet Take 1 tablet (5 mg total) by mouth 2 (two) times daily.  . Cholecalciferol (VITAMIN D PO) Take 1 tablet by mouth daily.  [name] CIALIS 20 MG tablet Take 20 mg by mouth daily as needed for erectile dysfunction.   . fluticasone (FLONASE) 50 MCG/ACT nasal spray Place 1 spray into both nostrils daily.  . Fluticasone-Salmeterol (ADVAIR) 250-50 MCG/DOSE AEPB Inhale 1 puff into the lungs 2 (two) times daily.   . furosemide (LASIX) 40 MG tablet Take 2 tablets (80 mg total) by mouth 2 (two) times daily.  . halobetasol (ULTRAVATE) 0.05 % cream Apply 1 application topically daily.  . Multiple Vitamins-Minerals (PRESERVISION AREDS 2 PO) Take 1 tablet by mouth daily.  . Omeprazole Magnesium (  PRILOSEC OTC PO) Take by mouth daily. Heartburn..the patient unsure of dosage .  . potassium chloride SA (K-DUR,KLOR-CON) 20 MEQ tablet Take 1 tablet (20 mEq total) by mouth 2 (two) times daily.  . vitamin B-12 (CYANOCOBALAMIN) 1000 MCG tablet Take 1,000 mcg by mouth daily.  [name] zolpidem (AMBIEN CR) 12.5 MG CR tablet Take 1 tablet by mouth at bedtime as needed. For sleep  . [DISCONTINUED] apixaban (ELIQUIS) 5 MG TABS tablet Take 1 tablet (5 mg total) by mouth 2 (two) times daily.   . [DISCONTINUED] furosemide (LASIX) 40 MG tablet take 2 tablets by mouth twice a day  . [DISCONTINUED] potassium chloride SA (K-DUR,KLOR-CON) 20 MEQ tablet Take 1 tablet (20 mEq total) by mouth 2 (two) times daily.  . [DISCONTINUED] sacubitril-valsartan (ENTRESTO) 24-26 MG Take 1 tablet by mouth 2 (two) times daily.     Allergies:   Patient has no known allergies.   Social History   Socioeconomic History  . Marital status: Married    Spouse name: Not on file  . Number of children: Not on file  . Years of education: Not on file  . Highest education level: Not on file  Occupational History  . Occupation: real estate    Comment: investment  Social Needs  . Financial resource strain: Not on file  . Food insecurity:    Worry: Not on file    Inability: Not on file  . Transportation needs:    Medical: Not on file    Non-medical: Not on file  Tobacco Use  . Smoking status: Former Smoker    Packs/day: 1.00    Years: 30.00    Pack years: 30.00    Types: Cigarettes, Cigars    Last attempt to quit: 08/02/2012    Years since quitting: 6.1  . Smokeless tobacco: Never Used  Substance and Sexual Activity  . Alcohol use: Yes    Alcohol/week: 0.0 standard drinks    Comment: once a week; previously 3-4 times per week   . Drug use: No  . Sexual activity: Yes  Lifestyle  . Physical activity:    Days per week: Not on file    Minutes per session: Not on file  . Stress: Not on file  Relationships  . Social connections:    Talks on phone: Not on file    Gets together: Not on file    Attends religious service: Not on file    Active member of club or organization: Not on file    Attends meetings of clubs or organizations: Not on file    Relationship status: Not on file  Other Topics Concern  . Not on file  Social History Narrative   Works as a Engineer, structural for apartments   Lives with wife.  They have one grown daughter   Highest level of education:  college     Family  History: The patient's family history includes Atrial fibrillation in his mother; COPD in his father; Cerebral aneurysm in his brother; Healthy in his daughter.  ROS:   Please see the history of present illness.    Shortness of breath with moderate to heavy activity.  Muscle pain.  Excessive sweating.  All other systems reviewed and are negative.  EKGs/Labs/Other Studies Reviewed:    The following studies were reviewed today: No new data  EKG:  EKG is ordered today.  The ekg ordered today demonstrates sinus rhythm, left atrial abnormality, left bundle with QRS duration 150 ms.  Normal PR interval.  Recent Labs: 09/25/2018: Hemoglobin 13.9; Platelets 241; TSH 1.500 10/04/2018: BUN 16; Creatinine, Ser 1.20; Potassium 4.2; Sodium 139  Recent Lipid Panel    Component Value Date/Time   CHOL 184 08/03/2013 0640   TRIG 149 08/03/2013 0640   HDL 50 08/03/2013 0640   CHOLHDL 3.7 08/03/2013 0640   VLDL 30 08/03/2013 0640   LDLCALC 104 (H) 08/03/2013 0640    Physical Exam:    VS:  BP 138/88   Pulse 84   Ht 5\' 11"  (1.803 m)   Wt (!) 323 lb 12.8 oz (146.9 kg)   SpO2 97%   BMI 45.16 kg/m     Wt Readings from Last 3 Encounters:  10/09/18 (!) 323 lb 12.8 oz (146.9 kg)  10/04/18 (!) 320 lb (145.2 kg)  09/18/18 (!) 324 lb 6.4 oz (147.1 kg)     GEN:  Well nourished, well developed in no acute distress HEENT: Normal NECK: No JVD. LYMPHATICS: No lymphadenopathy CARDIAC: RRR, no murmur, S3 gallop, no edema. VASCULAR: 2+ bilateral radial pulses.  No bruits. RESPIRATORY:  Clear to auscultation without rales, wheezing or rhonchi  ABDOMEN: Soft, non-tender, non-distended, No pulsatile mass, MUSCULOSKELETAL: No deformity  SKIN: Warm and dry NEUROLOGIC:  Alert and oriented x 3 PSYCHIATRIC:  Normal affect   ASSESSMENT:    1. Paroxysmal A-fib ([place])   2. Left bundle branch block   3. Chronic combined systolic and diastolic heart failure ([place])   4. Chronic anticoagulation   5.  Hypertension, essential   6. On amiodarone therapy   7. OSA (obstructive sleep apnea)   8. Acute diastolic heart failure ([place])    PLAN:    In order of problems listed above:  1. Currently in sinus rhythm on today's EKG.  2. No specific new data. 3. Tolerating low-dose Entresto.  Plan to uptitrate to 49/51 mg p.o. twice daily today.  Basic metabolic panel 10 to 14 days.  Office visit in 3 weeks at which time beta-blocker therapy will be started. 4. Continue Eliquis. 5. Blood pressure is better control since starting Eliquis.  Target 130/80 mmHg.  1 the patient about low-salt diet. 6. Not currently taking amiodarone 7. Sleep apnea and compliant with device.  Briefly discussed coronary angiography.  Will likely get around to doing this after medication titration is complete.  Likely in January.  Clinical follow-up in 2 to 3 weeks.  Blood work in 1 to 2 weeks.   Medication Adjustments/Labs and Tests Ordered: Current medicines are reviewed at length with the patient today.  Concerns regarding medicines are outlined above.  Orders Placed This Encounter  Procedures  . Basic metabolic panel   Meds ordered this encounter  Medications  . apixaban (ELIQUIS) 5 MG TABS tablet    Sig: Take 1 tablet (5 mg total) by mouth 2 (two) times daily.    Dispense:  180 tablet    Refill:  3  . furosemide (LASIX) 40 MG tablet    Sig: Take 2 tablets (80 mg total) by mouth 2 (two) times daily.    Dispense:  360 tablet    Refill:  3  . potassium chloride SA (K-DUR,KLOR-CON) 20 MEQ tablet    Sig: Take 1 tablet (20 mEq total) by mouth 2 (two) times daily.    Dispense:  60 tablet    Refill:  11  . sacubitril-valsartan (ENTRESTO) 49-51 MG    Sig: Take 1 tablet by mouth 2 (two) times daily.    Dispense:  60 tablet  Refill:  11    Dose increase    Patient Instructions  Medication Instructions:  Your physician has recommended you make the following change in your medication:  1.) increase Entresto  to 49/51 mg twice daily  Lab work: BMET - approx 10-14 days  If you have labs (blood work) drawn today and your tests are completely normal, you will receive your results only by: [name] MyChart Message (if you have MyChart) OR . A paper copy in the mail If you have any lab test that is abnormal or we need to change your treatment, we will call you to review the results.  Testing/Procedures: none  Follow-Up: Your physician recommends that you schedule a follow-up appointment in: 3 weeks with PA/NP for Dr. [name].  Any Other Special Instructions Will Be Listed Below (If Applicable).       Signed, [name], MD  10/09/2018 5:15 PM    [facility]

## 2018-10-09 ENCOUNTER — Ambulatory Visit: Payer: Managed Care, Other (non HMO) | Admitting: Interventional Cardiology

## 2018-10-09 ENCOUNTER — Encounter: Payer: Self-pay | Admitting: Interventional Cardiology

## 2018-10-09 VITALS — BP 138/88 | HR 84 | Ht 71.0 in | Wt 323.8 lb

## 2018-10-09 DIAGNOSIS — I5042 Chronic combined systolic (congestive) and diastolic (congestive) heart failure: Secondary | ICD-10-CM

## 2018-10-09 DIAGNOSIS — Z79899 Other long term (current) drug therapy: Secondary | ICD-10-CM

## 2018-10-09 DIAGNOSIS — Z7901 Long term (current) use of anticoagulants: Secondary | ICD-10-CM

## 2018-10-09 DIAGNOSIS — I480 Paroxysmal atrial fibrillation: Secondary | ICD-10-CM | POA: Diagnosis not present

## 2018-10-09 DIAGNOSIS — G4733 Obstructive sleep apnea (adult) (pediatric): Secondary | ICD-10-CM

## 2018-10-09 DIAGNOSIS — I10 Essential (primary) hypertension: Secondary | ICD-10-CM

## 2018-10-09 DIAGNOSIS — I5031 Acute diastolic (congestive) heart failure: Secondary | ICD-10-CM

## 2018-10-09 DIAGNOSIS — I447 Left bundle-branch block, unspecified: Secondary | ICD-10-CM | POA: Diagnosis not present

## 2018-10-09 MED ORDER — APIXABAN 5 MG PO TABS: 5.0000 mg | ORAL_TABLET | Freq: Two times a day (BID) | ORAL | 3 refills | Status: DC

## 2018-10-09 MED ORDER — POTASSIUM CHLORIDE CRYS ER 20 MEQ PO TBCR: 20.0000 meq | EXTENDED_RELEASE_TABLET | Freq: Two times a day (BID) | ORAL | 11 refills | Status: DC

## 2018-10-09 MED ORDER — FUROSEMIDE 40 MG PO TABS: 80.0000 mg | ORAL_TABLET | Freq: Two times a day (BID) | ORAL | 3 refills | Status: DC

## 2018-10-09 MED ORDER — SACUBITRIL-VALSARTAN 49-51 MG PO TABS: 1.0000 | ORAL_TABLET | Freq: Two times a day (BID) | ORAL | 11 refills | Status: DC

## 2018-10-09 NOTE — Patient Instructions (Signed)
Medication Instructions:  Your physician has recommended you make the following change in your medication:  1.) increase Entresto to 49/51 mg twice daily  Lab work: BMET - approx 10-14 days  If you have labs (blood work) drawn today and your tests are completely normal, you will receive your results only by: [name] MyChart Message (if you have MyChart) OR . A paper copy in the mail If you have any lab test that is abnormal or we need to change your treatment, we will call you to review the results.  Testing/Procedures: none  Follow-Up: Your physician recommends that you schedule a follow-up appointment in: 3 weeks with PA/NP for Dr. [name].  Any Other Special Instructions Will Be Listed Below (If Applicable).

## 2018-10-11 NOTE — Addendum Note (Signed)
Addended by: Carylon Perches on: 10/11/2018 11:10 AM   Modules accepted: Orders

## 2018-10-27 ENCOUNTER — Other Ambulatory Visit: Payer: Managed Care, Other (non HMO)

## 2018-10-30 ENCOUNTER — Telehealth: Payer: Self-pay | Admitting: Interventional Cardiology

## 2018-10-30 ENCOUNTER — Encounter (INDEPENDENT_AMBULATORY_CARE_PROVIDER_SITE_OTHER): Payer: Self-pay

## 2018-10-30 ENCOUNTER — Other Ambulatory Visit: Payer: Managed Care, Other (non HMO) | Admitting: *Deleted

## 2018-10-30 DIAGNOSIS — I5042 Chronic combined systolic (congestive) and diastolic (congestive) heart failure: Secondary | ICD-10-CM

## 2018-10-30 LAB — BASIC METABOLIC PANEL
BUN / CREAT RATIO: 10 (ref 10–24)
BUN: 14 mg/dL (ref 8–27)
CHLORIDE: 99 mmol/L (ref 96–106)
CO2: 25 mmol/L (ref 20–29)
Calcium: 8.9 mg/dL (ref 8.6–10.2)
Creatinine, Ser: 1.34 mg/dL — ABNORMAL HIGH (ref 0.76–1.27)
GFR calc non Af Amer: 55 mL/min/{1.73_m2} — ABNORMAL LOW (ref >59)
GFR, EST AFRICAN AMERICAN: 64 mL/min/{1.73_m2} (ref >59)
Glucose: 88 mg/dL (ref 65–99)
Potassium: 4 mmol/L (ref 3.5–5.2)
SODIUM: 142 mmol/L (ref 134–144)

## 2018-10-30 NOTE — Telephone Encounter (Signed)
Informed pt of results. Pt verbalized understanding. 

## 2018-10-30 NOTE — Telephone Encounter (Signed)
Follow up   Pt returning call for nurse about labs

## 2018-10-31 NOTE — Progress Notes (Signed)
CARDIOLOGY OFFICE NOTE  Date:  11/01/2018    Nicholas Foley Date of Birth: 11-05-1951 Medical Record #[id number]  PCP:  [name], MD  Cardiologist:  [name]  & [facility]  Chief Complaint  Patient presents with  . Congestive Heart Failure    Follow up visit - seen for Dr. [name]    History of Present Illness: Nicholas Foley is a 67 y.o. male who presents today for a 3 week check. Seen for Dr. [name].   He has a history of paroxysmal atrial fibrillation - previously on on amiodarone therapy, obstructive sleep apnea - on CPAP, hypertension,chronic anticoagulation (Apixaban), recently found chronic combined systolic and diastolic heart failure, morbid obesity, and left bundle branch block.  He was seen by Dr. [name] about 3 weeks ago - he has wanted to proceed with cardiac cath once on optimal heart failure therapy. If he has no coronary disease and after appropriate guideline directed medical therapy for systolic dysfunction, the left ventricle remains depressed, he may be a candidate for resynchronization therapy/ICD. He has seen Dr. [name] as well - event monitor in process looking for AF burden. He is on Entresto. This dose has been increased. Beta blocker to be started. Dr. [name] was anticipating cath later in January of 2020.   Comes in today. Here alone. He notes that he continues to feel "even better" with the [facility]. He has had less shortness of breath. No chest pain. Not dizzy. Still with lots of BP and HR to work with. He turned in his monitor earlier this week - final report pending. Preliminary findings with recurrent AF with RVR and runs of PSVT noted. He was asymptomatic. He remains on Eliquis. No bleeding/bruising.   Past Medical History:  Diagnosis Date  . Atrial fibrillation with rapid ventricular response ([place]) 08/02/2013  . Chronic systolic dysfunction of left ventricle 08/02/2013   BNP greater than 2000   . COPD (chronic obstructive pulmonary disease) (HCC)    Moderate by PFTs with response to bronchodilators, May 2014  . Erectile dysfunction   . Hypertension   . Insomnia 08/02/13  . Left bundle branch block 08/02/2013  . Morbid obesity ([place]) 08/02/2013  . OSA (obstructive sleep apnea)    severe OSA with AHI 74/hr now on CPAP at 8cm H2O    Past Surgical History:  Procedure Laterality Date  . CARDIOVERSION N/A 08/06/2013   Procedure: CARDIOVERSION;  Surgeon: Lelon Perla, MD;  Location[facility] ENDOSCOPY;  Service: Cardiovascular;  Laterality: N/A;  spoke with [name]   . CARDIOVERSION N/A 09/03/2013   Procedure: CARDIOVERSION;  Surgeon: [name], MD;  Location: [place];  Service: Cardiovascular;  Laterality: N/A;  . NASAL SEPTUM SURGERY    . TEE WITHOUT CARDIOVERSION N/A 08/06/2013   Procedure: TRANSESOPHAGEAL ECHOCARDIOGRAM (TEE);  Surgeon: Lelon Perla, MD;  Location[facility] ENDOSCOPY;  Service: Cardiovascular;  Laterality: N/A;  . VASECTOMY       Medications: Current Meds  Medication Sig  . acetaminophen (TYLENOL) 650 MG CR tablet Take 650 mg by mouth every 8 (eight) hours as needed for pain.  [name] apixaban (ELIQUIS) 5 MG TABS tablet Take 1 tablet (5 mg total) by mouth 2 (two) times daily.  . Cholecalciferol (VITAMIN D PO) Take 1 tablet by mouth daily.  [name] CIALIS 20 MG tablet Take 20 mg by mouth daily as needed for erectile dysfunction.   . fluticasone (FLONASE) 50 MCG/ACT nasal spray Place 1 spray into both nostrils daily.  [name]  Fluticasone-Salmeterol (ADVAIR) 250-50 MCG/DOSE AEPB Inhale 1 puff into the lungs 2 (two) times daily.   . furosemide (LASIX) 40 MG tablet Take 2 tablets (80 mg total) by mouth 2 (two) times daily.  . halobetasol (ULTRAVATE) 0.05 % cream Apply 1 application topically daily.  . Multiple Vitamins-Minerals (PRESERVISION AREDS 2 PO) Take 1 tablet by mouth daily.  . Omeprazole Magnesium (PRILOSEC OTC PO) Take by mouth daily. Heartburn..the patient unsure of dosage .  . potassium chloride SA (K-DUR,KLOR-CON) 20  MEQ tablet Take 1 tablet (20 mEq total) by mouth 2 (two) times daily.  . sacubitril-valsartan (ENTRESTO) 49-51 MG Take 1 tablet by mouth 2 (two) times daily.  . vitamin B-12 (CYANOCOBALAMIN) 1000 MCG tablet Take 1,000 mcg by mouth daily.  [name] zolpidem (AMBIEN CR) 12.5 MG CR tablet Take 1 tablet by mouth at bedtime as needed. For sleep     Allergies: No Known Allergies  Social History: The patient  reports that he quit smoking about 6 years ago. His smoking use included cigarettes and cigars. He has a 30.00 pack-year smoking history. He has never used smokeless tobacco. He reports that he drinks alcohol. He reports that he does not use drugs.   Family History: The patient's family history includes Atrial fibrillation in his mother; COPD in his father; Cerebral aneurysm in his brother; Healthy in his daughter.   Review of Systems: Please see the history of present illness.   Otherwise, the review of systems is positive for none.   All other systems are reviewed and negative.   Physical Exam: VS:  BP (!) 130/94   Pulse 95   Ht 5\' 11"  (1.803 m)   Wt (!) 323 lb 12.8 oz (146.9 kg)   SpO2 95%   BMI 45.16 kg/m  .  BMI Body mass index is 45.16 kg/m.  Wt Readings from Last 3 Encounters:  11/01/18 (!) 323 lb 12.8 oz (146.9 kg)  10/09/18 (!) 323 lb 12.8 oz (146.9 kg)  10/04/18 (!) 320 lb (145.2 kg)    General: Pleasant. Morbidly obese. Alert and in no acute distress.   HEENT: Normal.  Neck: Supple, no JVD, carotid bruits, or masses noted.  Cardiac: Regular rate and rhythm. Heart rate is a little fast. No edema.  Respiratory:  Lungs are clear to auscultation bilaterally with normal work of breathing.  GI: Soft and nontender.  MS: No deformity or atrophy. Gait and ROM intact.  Skin: Warm and dry. Color is normal.  Neuro:  Strength and sensation are intact and no gross focal deficits noted.  Psych: Alert, appropriate and with normal affect.   LABORATORY DATA:  EKG:  EKG is not ordered  today.  Lab Results  Component Value Date   WBC 7.0 09/25/2018   HGB 13.9 09/25/2018   HCT 39.4 09/25/2018   PLT 241 09/25/2018   GLUCOSE 88 10/30/2018   CHOL 184 08/03/2013   TRIG 149 08/03/2013   HDL 50 08/03/2013   LDLCALC 104 (H) 08/03/2013   ALT 22 03/07/2017   AST 13 03/07/2017   NA 142 10/30/2018   K 4.0 10/30/2018   CL 99 10/30/2018   CREATININE 1.34 (H) 10/30/2018   BUN 14 10/30/2018   CO2 25 10/30/2018   TSH 1.500 09/25/2018   INR 1.10 08/02/2013   HGBA1C 5.3 08/06/2016        BNP (last 3 results) No results for input(s): BNP in the last 8760 hours.  ProBNP (last 3 results) No results for input(s): PROBNP in  the last 8760 hours.   Other Studies Reviewed Today:  Echo Study Conclusions 09/2018  - Left ventricle: The cavity size was mildly dilated. There was   moderate concentric hypertrophy. Systolic function was moderately   reduced. The estimated ejection fraction was in the range of 35%   to 40%. Mild diffuse hypokinesis with no identifiable regional   variations. Features are consistent with a pseudonormal left   ventricular filling pattern, with concomitant abnormal relaxation   and increased filling pressure (grade 2 diastolic dysfunction). - Ventricular septum: Septal motion showed abnormal function,   dyssynergy, and paradox. These changes are consistent with a left   bundle branch block. - Left atrium: The atrium was mildly dilated.  Impressions:  - Prominent LBBB-related dyssynchrony is present.   Assessment/Plan:  1. New onset chronic systolic and diastolic HF - EF of 35 to 40% - managed medically with plans for future cardiac cath. On good doses of Entresto. Starting Coreg 6.25 mg BID today. Cautioned about shortness of breath. He had lab earlier this week which was stable. Plan to recheck on return.   2. PAF - preliminary monitor with several runs of AF with RVR along with PSVT - Coreg being started today. Remains on  anticoagulation.   3. HTN - starting Coreg today  4. OSA - on CPAP  5. LBBB - dyssynchrony on echo - may be looking at device implant going forward   6. Morbid obesity  7. Former smoker.    Current medicines are reviewed with the patient today.  The patient does not have concerns regarding medicines other than what has been noted above.  The following changes have been made:  See above.  Labs/ tests ordered today include:   No orders of the defined types were placed in this encounter.    Disposition:   FU with me in about 2 to 3 weeks. Plan to arrange cardiac cath on return. Seeing Dr. [name] next month as well to discuss possible device implant.   Patient is agreeable to this plan and will call if any problems develop in the interim.   SignedTruitt Merle, NP  11/01/2018 12:13 PM  Nicholas Foley [address] [place], [place]  [address] Phone: [phone] Fax: [phone]

## 2018-11-01 ENCOUNTER — Ambulatory Visit: Payer: Managed Care, Other (non HMO) | Admitting: Nurse Practitioner

## 2018-11-01 ENCOUNTER — Encounter: Payer: Self-pay | Admitting: Nurse Practitioner

## 2018-11-01 VITALS — BP 130/94 | HR 95 | Ht 71.0 in | Wt 323.8 lb

## 2018-11-01 DIAGNOSIS — I5032 Chronic diastolic (congestive) heart failure: Secondary | ICD-10-CM | POA: Diagnosis not present

## 2018-11-01 DIAGNOSIS — I10 Essential (primary) hypertension: Secondary | ICD-10-CM

## 2018-11-01 DIAGNOSIS — I5042 Chronic combined systolic (congestive) and diastolic (congestive) heart failure: Secondary | ICD-10-CM

## 2018-11-01 DIAGNOSIS — I480 Paroxysmal atrial fibrillation: Secondary | ICD-10-CM | POA: Diagnosis not present

## 2018-11-01 MED ORDER — CARVEDILOL 6.25 MG PO TABS: 6.2500 mg | ORAL_TABLET | Freq: Two times a day (BID) | ORAL | 3 refills | Status: DC

## 2018-11-01 NOTE — Patient Instructions (Addendum)
We will be checking the following labs today - NONE  If you have labs (blood work) drawn today and your tests are completely normal, you will receive your results only by: [name] MyChart Message (if you have MyChart) OR . A paper copy in the mail If you have any lab test that is abnormal or we need to change your treatment, we will call you to review the results.   Medication Instructions:    Continue with your current medicines. BUT   I am adding Coreg 6.25 mg to take twice a day - this is at your pharmacy - please start first dose tonight - and then twice a day starting tomorrow.    If you need a refill on your cardiac medications before your next appointment, please call your pharmacy.     Testing/Procedures To Be Arranged:  N/A  Follow-Up:   See me in early January    At [facility], you and your health needs are our priority.  As part of our continuing mission to provide you with exceptional heart care, we have created designated Provider Care Teams.  These Care Teams include your primary Cardiologist (physician) and Advanced Practice Providers (APPs -  Physician Assistants and Nurse Practitioners) who all work together to provide you with the care you need, when you need it.  Special Instructions:  . None  Call the [place] office at [phone] if you have any questions, problems or concerns.

## 2018-11-19 ENCOUNTER — Other Ambulatory Visit: Payer: Self-pay | Admitting: Interventional Cardiology

## 2018-11-19 DIAGNOSIS — I5031 Acute diastolic (congestive) heart failure: Secondary | ICD-10-CM

## 2018-11-27 ENCOUNTER — Ambulatory Visit: Payer: Managed Care, Other (non HMO) | Admitting: Nurse Practitioner

## 2018-11-27 ENCOUNTER — Ambulatory Visit
Admission: RE | Admit: 2018-11-27 | Discharge: 2018-11-27 | Disposition: A | Discharge status: Home / Self Care | Payer: Managed Care, Other (non HMO) | Source: Ambulatory Visit | Attending: Nurse Practitioner | Admitting: Nurse Practitioner

## 2018-11-27 ENCOUNTER — Encounter: Payer: Self-pay | Admitting: Nurse Practitioner

## 2018-11-27 VITALS — BP 150/76 | HR 94 | Ht 71.0 in | Wt 325.8 lb

## 2018-11-27 DIAGNOSIS — R0600 Dyspnea, unspecified: Secondary | ICD-10-CM

## 2018-11-27 DIAGNOSIS — I5042 Chronic combined systolic (congestive) and diastolic (congestive) heart failure: Secondary | ICD-10-CM | POA: Diagnosis not present

## 2018-11-27 DIAGNOSIS — J449 Chronic obstructive pulmonary disease, unspecified: Secondary | ICD-10-CM

## 2018-11-27 DIAGNOSIS — Z7901 Long term (current) use of anticoagulants: Secondary | ICD-10-CM

## 2018-11-27 DIAGNOSIS — I447 Left bundle-branch block, unspecified: Secondary | ICD-10-CM

## 2018-11-27 NOTE — H&P (View-Only) (Signed)
CARDIOLOGY OFFICE NOTE  Date:  11/27/2018    Nicholas Foley Date of Birth: 09-01-1951 Medical Record #[id number]  PCP:  [name], MD  Cardiologist:  [name]    Chief Complaint  Patient presents with  . Congestive Heart Failure    Follow up visit - seen for Dr. [name]    History of Present Illness: Nicholas Foley is a 68 y.o. male who presents today for a one month check. Seen for Dr. [name].   He has a history of paroxysmal atrial fibrillation - previously on on amiodarone therapy, obstructive sleep apnea - on CPAP, hypertension,chronic anticoagulation (Apixaban), recently found chroniccombined systolic anddiastolic heart failure, morbid obesity, and left bundle branch block.  He was seen by Dr. [name] back in November of 2019. He has wanted to proceed with cardiac cath once on optimal heart failure therapy. If he has no coronary disease and after appropriate guideline directed medical therapy for systolic dysfunction, the left ventricle remains depressed, he may be a candidate for resynchronization therapy/ICD. He has seen Dr. [name] as well - event monitor in process looking for AF burden. He is on Entresto. This dose has been increased. Beta blocker to be started. Dr. [name] was anticipating cath later in January of 2020.   I saw him about 3 weeks ago - he was doing well. Less short of breath. Coreg was started.   Comes in today. Here alone. He tells me now that his breathing has been getting worse "for a while". He admits his shortness of breath was "downplayed" at last visit.  While he did feel better with [name] - this has "passed now". He feels like his breathing has gotten worse since my last visit - he is not sure if this is from his heart or his COPD. He wants to proceed on with "whatever is needed". No chest pain. Not lightheaded or dizzy. He has not had his diuretic yet today.   Past Medical History:  Diagnosis Date  . Atrial fibrillation with rapid  ventricular response ([place]) 08/02/2013  . Chronic systolic dysfunction of left ventricle 08/02/2013   BNP greater than 2000   . COPD (chronic obstructive pulmonary disease) (HCC)    Moderate by PFTs with response to bronchodilators, May 2014  . Erectile dysfunction   . Hypertension   . Insomnia 08/02/13  . Left bundle branch block 08/02/2013  . Morbid obesity ([place]) 08/02/2013  . OSA (obstructive sleep apnea)    severe OSA with AHI 74/hr now on CPAP at 8cm H2O    Past Surgical History:  Procedure Laterality Date  . CARDIOVERSION N/A 08/06/2013   Procedure: CARDIOVERSION;  Surgeon: Lelon Perla, MD;  Location: [facility] ENDOSCOPY;  Service: Cardiovascular;  Laterality: N/A;  spoke with [name]   . CARDIOVERSION N/A 09/03/2013   Procedure: CARDIOVERSION;  Surgeon: [name], MD;  Location: [place];  Service: Cardiovascular;  Laterality: N/A;  . NASAL SEPTUM SURGERY    . TEE WITHOUT CARDIOVERSION N/A 08/06/2013   Procedure: TRANSESOPHAGEAL ECHOCARDIOGRAM (TEE);  Surgeon: Lelon Perla, MD;  Location: [facility] ENDOSCOPY;  Service: Cardiovascular;  Laterality: N/A;  . VASECTOMY       Medications: Current Meds  Medication Sig  . acetaminophen (TYLENOL) 650 MG CR tablet Take 650 mg by mouth every 8 (eight) hours as needed for pain.  [name] apixaban (ELIQUIS) 5 MG TABS tablet Take 1 tablet (5 mg total) by mouth 2 (two) times daily.  . carvedilol (COREG)  6.25 MG tablet Take 1 tablet (6.25 mg total) by mouth 2 (two) times daily.  . Cholecalciferol (VITAMIN D PO) Take 1 tablet by mouth daily.  [name] CIALIS 20 MG tablet Take 20 mg by mouth daily as needed for erectile dysfunction.   . fluticasone (FLONASE) 50 MCG/ACT nasal spray Place 1 spray into both nostrils daily.  . Fluticasone-Salmeterol (ADVAIR) 250-50 MCG/DOSE AEPB Inhale 1 puff into the lungs 2 (two) times daily.   . furosemide (LASIX) 40 MG tablet Take 2 tablets (80 mg total) by mouth 2 (two) times daily.  . halobetasol (ULTRAVATE) 0.05 %  cream Apply 1 application topically daily.  . Multiple Vitamins-Minerals (PRESERVISION AREDS 2 PO) Take 1 tablet by mouth daily.  . Omeprazole Magnesium (PRILOSEC OTC PO) Take by mouth daily. Heartburn..the patient unsure of dosage .  . potassium chloride SA (K-DUR,KLOR-CON) 20 MEQ tablet TAKE 1 TABLET(20 MEQ) BY MOUTH TWICE DAILY  . sacubitril-valsartan (ENTRESTO) 49-51 MG Take 1 tablet by mouth 2 (two) times daily.  . vitamin B-12 (CYANOCOBALAMIN) 1000 MCG tablet Take 1,000 mcg by mouth daily.  [name] zolpidem (AMBIEN CR) 12.5 MG CR tablet Take 1 tablet by mouth at bedtime as needed. For sleep     Allergies: No Known Allergies  Social History: The patient  reports that he quit smoking about 6 years ago. His smoking use included cigarettes and cigars. He has a 30.00 pack-year smoking history. He has never used smokeless tobacco. He reports current alcohol use. He reports that he does not use drugs.   Family History: The patient's family history includes Atrial fibrillation in his mother; COPD in his father; Cerebral aneurysm in his brother; Healthy in his daughter.   Review of Systems: Please see the history of present illness.   Otherwise, the review of systems is positive for none.   All other systems are reviewed and negative.   Physical Exam: VS:  BP (!) 150/76 (BP Location: Left Arm, Patient Position: Standing, Cuff Size: Large)   Pulse 94   Ht 5\' 11"  (1.803 m)   Wt (!) 325 lb 12.8 oz (147.8 kg)   SpO2 96% Comment: at rest  BMI 45.44 kg/m  .  BMI Body mass index is 45.44 kg/m.  Wt Readings from Last 3 Encounters:  11/27/18 (!) 325 lb 12.8 oz (147.8 kg)  11/01/18 (!) 323 lb 12.8 oz (146.9 kg)  10/09/18 (!) 323 lb 12.8 oz (146.9 kg)    General: Pleasant. Morbidly obese. Alert and in no acute distress but he is a little short of breath with just sitting on the exam table intermittently. Weight is up 2 pounds.  HEENT: Normal.  Neck: Supple, no JVD, carotid bruits, or masses  noted.  Cardiac: Regular rate and rhythm. Heart tones are distant. No edema.  Respiratory:  Lungs are clear to auscultation bilaterally with normal work of breathing but difficult due to body habitus.  GI: Soft and nontender.  MS: No deformity or atrophy. Gait and ROM intact.  Skin: Warm and dry. Color is normal.  Neuro:  Strength and sensation are intact and no gross focal deficits noted.  Psych: Alert, appropriate and with normal affect.   LABORATORY DATA:  EKG:  EKG is ordered today. This demonstrates NSR - LBBB - does have 1st degree AV block  Lab Results  Component Value Date   WBC 7.0 09/25/2018   HGB 13.9 09/25/2018   HCT 39.4 09/25/2018   PLT 241 09/25/2018   GLUCOSE 88 10/30/2018   CHOL  184 08/03/2013   TRIG 149 08/03/2013   HDL 50 08/03/2013   LDLCALC 104 (H) 08/03/2013   ALT 22 03/07/2017   AST 13 03/07/2017   NA 142 10/30/2018   K 4.0 10/30/2018   CL 99 10/30/2018   CREATININE 1.34 (H) 10/30/2018   BUN 14 10/30/2018   CO2 25 10/30/2018   TSH 1.500 09/25/2018   INR 1.10 08/02/2013   HGBA1C 5.3 08/06/2016     BNP (last 3 results) No results for input(s): BNP in the last 8760 hours.  ProBNP (last 3 results) No results for input(s): PROBNP in the last 8760 hours.   Other Studies Reviewed Today:  Echo Study Conclusions 09/2018  - Left ventricle: The cavity size was mildly dilated. There was moderate concentric hypertrophy. Systolic function was moderately reduced. The estimated ejection fraction was in the range of 35% to 40%. Mild diffuse hypokinesis with no identifiable regional variations. Features are consistent with a pseudonormal left ventricular filling pattern, with concomitant abnormal relaxation and increased filling pressure (grade 2 diastolic dysfunction). - Ventricular septum: Septal motion showed abnormal function, dyssynergy, and paradox. These changes are consistent with a left bundle branch block. - Left atrium: The  atrium was mildly dilated.  Impressions:  - Prominent LBBB-related dyssynchrony is present.   Assessment/Plan:  1. New onset chronic systolic and diastolic HF - EF of 35 to 40% - managed medically with plans for future cardiac cath. On good doses of Entresto. He has been placed on Coreg - ?may be causing worsening SOB. He wants to proceed on with cardiac cath. Discussed with Dr. [name] and we will proceed with L/R cath next week. Hold Eliquis 4 doses prior per pharmacy. Lab and CXR today. The patient understands that risks include but are not limited to stroke (1 in 1000), death (1 in 9), kidney failure [usually temporary] (1 in 500), bleeding (1 in 200), allergic reaction [possibly serious] (1 in 200), and agrees to proceed. I have left him on his current regimen for now. Further disposition pending.   2. PAF - preliminary monitor with several runs of AF with RVR along with PSVT - Coreg has been started. He remains on his anticoagulation. I am sending for CXR as well today along with BNP. He is in NSR today by EKG and exam.   3. HTN - not at goal. Will proceed with cath and then address accordingly.   4. OSA - on CPAP  5. LBBB - dyssynchrony on echo - may be looking at device implant going forward - has EP follow up planned. I do not think he will be able to have further increase in his beta blocker with 1st degree AV block on EKG today.   6. Morbid obesity  7. Former smoker.    Current medicines are reviewed with the patient today.  The patient does not have concerns regarding medicines other than what has been noted above.  The following changes have been made:  See above.  Labs/ tests ordered today include:    Orders Placed This Encounter  Procedures  . DG Chest 2 View  . Comprehensive metabolic panel  . CBC  . Pro b natriuretic peptide (BNP)  . EKG 12-Lead     Disposition:   FU with EP as planned and will arrange OV with Dr. [name] in about 6 weeks - I am  happy to see as needed.   Patient is agreeable to this plan and will call if any problems develop in the  interim.   SignedTruitt Merle, NP  11/27/2018 11:03 AM  [place] [address] [place], [place]  [address] Phone: [phone] Fax: [phone]

## 2018-11-27 NOTE — Progress Notes (Addendum)
CARDIOLOGY OFFICE NOTE  Date:  11/27/2018    [name] Date of Birth: 1951-01-10 Medical Record #[id number]  PCP:  [name], MD  Cardiologist:  [name]    Chief Complaint  Patient presents with  . Congestive Heart Failure    Follow up visit - seen for Dr. [name]    History of Present Illness: Nicholas Foley is a 68 y.o. male who presents today for a one month check. Seen for Dr. [name].   He has a history of paroxysmal atrial fibrillation - previously on on amiodarone therapy, obstructive sleep apnea - on CPAP, hypertension,chronic anticoagulation (Apixaban), recently found chroniccombined systolic anddiastolic heart failure, morbid obesity, and left bundle branch block.  He was seen by Dr. [name] back in November of 2019. He has wanted to proceed with cardiac cath once on optimal heart failure therapy. If he has no coronary disease and after appropriate guideline directed medical therapy for systolic dysfunction, the left ventricle remains depressed, he may be a candidate for resynchronization therapy/ICD. He has seen Dr. [name] as well - event monitor in process looking for AF burden. He is on Entresto. This dose has been increased. Beta blocker to be started. Dr. [name] was anticipating cath later in January of 2020.   I saw him about 3 weeks ago - he was doing well. Less short of breath. Coreg was started.   Comes in today. Here alone. He tells me now that his breathing has been getting worse "for a while". He admits his shortness of breath was "downplayed" at last visit.  While he did feel better with [name] - this has "passed now". He feels like his breathing has gotten worse since my last visit - he is not sure if this is from his heart or his COPD. He wants to proceed on with "whatever is needed". No chest pain. Not lightheaded or dizzy. He has not had his diuretic yet today.   Past Medical History:  Diagnosis Date  . Atrial fibrillation with rapid  ventricular response ([place]) 08/02/2013  . Chronic systolic dysfunction of left ventricle 08/02/2013   BNP greater than 2000   . COPD (chronic obstructive pulmonary disease) ([facility])    Moderate by PFTs with response to bronchodilators, May 2014  . Erectile dysfunction   . Hypertension   . Insomnia 08/02/13  . Left bundle branch block 08/02/2013  . Morbid obesity ([name]) 08/02/2013  . OSA (obstructive sleep apnea)    severe OSA with AHI 74/hr now on CPAP at 8cm H2O    Past Surgical History:  Procedure Laterality Date  . CARDIOVERSION N/A 08/06/2013   Procedure: CARDIOVERSION;  Surgeon: Lelon Perla, MD;  Location: [facility] ENDOSCOPY;  Service: Cardiovascular;  Laterality: N/A;  spoke with [name]   . CARDIOVERSION N/A 09/03/2013   Procedure: CARDIOVERSION;  Surgeon: [name], MD;  Location: [place];  Service: Cardiovascular;  Laterality: N/A;  . NASAL SEPTUM SURGERY    . TEE WITHOUT CARDIOVERSION N/A 08/06/2013   Procedure: TRANSESOPHAGEAL ECHOCARDIOGRAM (TEE);  Surgeon: Lelon Perla, MD;  Location: [facility] ENDOSCOPY;  Service: Cardiovascular;  Laterality: N/A;  . VASECTOMY       Medications: Current Meds  Medication Sig  . acetaminophen (TYLENOL) 650 MG CR tablet Take 650 mg by mouth every 8 (eight) hours as needed for pain.  [name] apixaban (ELIQUIS) 5 MG TABS tablet Take 1 tablet (5 mg total) by mouth 2 (two) times daily.  . carvedilol (COREG)  6.25 MG tablet Take 1 tablet (6.25 mg total) by mouth 2 (two) times daily.  . Cholecalciferol (VITAMIN D PO) Take 1 tablet by mouth daily.  [name] CIALIS 20 MG tablet Take 20 mg by mouth daily as needed for erectile dysfunction.   . fluticasone (FLONASE) 50 MCG/ACT nasal spray Place 1 spray into both nostrils daily.  . Fluticasone-Salmeterol (ADVAIR) 250-50 MCG/DOSE AEPB Inhale 1 puff into the lungs 2 (two) times daily.   . furosemide (LASIX) 40 MG tablet Take 2 tablets (80 mg total) by mouth 2 (two) times daily.  . halobetasol (ULTRAVATE) 0.05 %  cream Apply 1 application topically daily.  . Multiple Vitamins-Minerals (PRESERVISION AREDS 2 PO) Take 1 tablet by mouth daily.  . Omeprazole Magnesium (PRILOSEC OTC PO) Take by mouth daily. Heartburn..the patient unsure of dosage .  . potassium chloride SA (K-DUR,KLOR-CON) 20 MEQ tablet TAKE 1 TABLET(20 MEQ) BY MOUTH TWICE DAILY  . sacubitril-valsartan (ENTRESTO) 49-51 MG Take 1 tablet by mouth 2 (two) times daily.  . vitamin B-12 (CYANOCOBALAMIN) 1000 MCG tablet Take 1,000 mcg by mouth daily.  [name] zolpidem (AMBIEN CR) 12.5 MG CR tablet Take 1 tablet by mouth at bedtime as needed. For sleep     Allergies: No Known Allergies  Social History: The patient  reports that he quit smoking about 6 years ago. His smoking use included cigarettes and cigars. He has a 30.00 pack-year smoking history. He has never used smokeless tobacco. He reports current alcohol use. He reports that he does not use drugs.   Family History: The patient's family history includes Atrial fibrillation in his mother; COPD in his father; Cerebral aneurysm in his brother; Healthy in his daughter.   Review of Systems: Please see the history of present illness.   Otherwise, the review of systems is positive for none.   All other systems are reviewed and negative.   Physical Exam: VS:  BP (!) 150/76 (BP Location: Left Arm, Patient Position: Standing, Cuff Size: Large)   Pulse 94   Ht 5\' 11"  (1.803 m)   Wt (!) 325 lb 12.8 oz (147.8 kg)   SpO2 96% Comment: at rest  BMI 45.44 kg/m  .  BMI Body mass index is 45.44 kg/m.  Wt Readings from Last 3 Encounters:  11/27/18 (!) 325 lb 12.8 oz (147.8 kg)  11/01/18 (!) 323 lb 12.8 oz (146.9 kg)  10/09/18 (!) 323 lb 12.8 oz (146.9 kg)    General: Pleasant. Morbidly obese. Alert and in no acute distress but he is a little short of breath with just sitting on the exam table intermittently. Weight is up 2 pounds.  HEENT: Normal.  Neck: Supple, no JVD, carotid bruits, or masses  noted.  Cardiac: Regular rate and rhythm. Heart tones are distant. No edema.  Respiratory:  Lungs are clear to auscultation bilaterally with normal work of breathing but difficult due to body habitus.  GI: Soft and nontender.  MS: No deformity or atrophy. Gait and ROM intact.  Skin: Warm and dry. Color is normal.  Neuro:  Strength and sensation are intact and no gross focal deficits noted.  Psych: Alert, appropriate and with normal affect.   LABORATORY DATA:  EKG:  EKG is ordered today. This demonstrates NSR - LBBB - does have 1st degree AV block  Lab Results  Component Value Date   WBC 7.0 09/25/2018   HGB 13.9 09/25/2018   HCT 39.4 09/25/2018   PLT 241 09/25/2018   GLUCOSE 88 10/30/2018   CHOL  184 08/03/2013   TRIG 149 08/03/2013   HDL 50 08/03/2013   LDLCALC 104 (H) 08/03/2013   ALT 22 03/07/2017   AST 13 03/07/2017   NA 142 10/30/2018   K 4.0 10/30/2018   CL 99 10/30/2018   CREATININE 1.34 (H) 10/30/2018   BUN 14 10/30/2018   CO2 25 10/30/2018   TSH 1.500 09/25/2018   INR 1.10 08/02/2013   HGBA1C 5.3 08/06/2016     BNP (last 3 results) No results for input(s): BNP in the last 8760 hours.  ProBNP (last 3 results) No results for input(s): PROBNP in the last 8760 hours.   Other Studies Reviewed Today:  Echo Study Conclusions 09/2018  - Left ventricle: The cavity size was mildly dilated. There was moderate concentric hypertrophy. Systolic function was moderately reduced. The estimated ejection fraction was in the range of 35% to 40%. Mild diffuse hypokinesis with no identifiable regional variations. Features are consistent with a pseudonormal left ventricular filling pattern, with concomitant abnormal relaxation and increased filling pressure (grade 2 diastolic dysfunction). - Ventricular septum: Septal motion showed abnormal function, dyssynergy, and paradox. These changes are consistent with a left bundle branch block. - Left atrium: The  atrium was mildly dilated.  Impressions:  - Prominent LBBB-related dyssynchrony is present.   Assessment/Plan:  1. New onset chronic systolic and diastolic HF - EF of 35 to 40% - managed medically with plans for future cardiac cath. On good doses of Entresto. He has been placed on Coreg - ?may be causing worsening SOB. He wants to proceed on with cardiac cath. Discussed with Dr. [name] and we will proceed with L/R cath next week. Hold Eliquis 4 doses prior per pharmacy. Lab and CXR today. The patient understands that risks include but are not limited to stroke (1 in 1000), death (1 in 79), kidney failure [usually temporary] (1 in 500), bleeding (1 in 200), allergic reaction [possibly serious] (1 in 200), and agrees to proceed. I have left him on his current regimen for now. Further disposition pending.   2. PAF - preliminary monitor with several runs of AF with RVR along with PSVT - Coreg has been started. He remains on his anticoagulation. I am sending for CXR as well today along with BNP. He is in NSR today by EKG and exam.   3. HTN - not at goal. Will proceed with cath and then address accordingly.   4. OSA - on CPAP  5. LBBB - dyssynchrony on echo - may be looking at device implant going forward - has EP follow up planned. I do not think he will be able to have further increase in his beta blocker with 1st degree AV block on EKG today.   6. Morbid obesity  7. Former smoker.    Current medicines are reviewed with the patient today.  The patient does not have concerns regarding medicines other than what has been noted above.  The following changes have been made:  See above.  Labs/ tests ordered today include:    Orders Placed This Encounter  Procedures  . DG Chest 2 View  . Comprehensive metabolic panel  . CBC  . Pro b natriuretic peptide (BNP)  . EKG 12-Lead     Disposition:   FU with EP as planned and will arrange OV with Dr. [name] in about 6 weeks - I am  happy to see as needed.   Patient is agreeable to this plan and will call if any problems develop in the  interim.   SignedTruitt Merle, NP  11/27/2018 11:03 AM  [facility] [address] [place],   [address] Phone: [phone] Fax: [phone]

## 2018-11-27 NOTE — Patient Instructions (Signed)
We will be checking the following labs today - CMET, CBC and BNP  Please go to [facility] on the first floor for a chest Xray - you may walk in.   Medication Instructions:    Continue with your current medicines.    If you need a refill on your cardiac medications before your next appointment, please call your pharmacy.     Testing/Procedures To Be Arranged:  Cardiac Catheterization  Follow-Up:   See Dr. [name] as planned in January  Tentatively see Dr. [name] March 5th    At [facility], you and your health needs are our priority.  As part of our continuing mission to provide you with exceptional heart care, we have created designated Provider Care Teams.  These Care Teams include your primary Cardiologist (physician) and Advanced Practice Providers (APPs -  Physician Assistants and Nurse Practitioners) who all work together to provide you with the care you need, when you need it.  Special Instructions:  Your provider has recommended a cardiac catherization  You are scheduled for a cardiac catheterization on Monday, January 13th at [facility] with Dr. [name] or associate.  Please arrive at the [facility] (Main Entrance) at [facility] at [place] Stay on Monday, January 13th at [place] note: Every effort is made to have your procedure done on time.   Please understand that emergencies sometimes delay a scheduled   procedure.  No food or drink after midnight on Sunday.    You may take your morning medications with a sip of water on the day of your procedure.  Please take a baby aspirin (81 mg) on the morning of your procedure.   Medications to HOLD - Eliquis  - HOLD Saturday and Sunday prior to cath on Monday HOLD LASIX Monday AM  Plan for a one night stay -- bring personal belongings.  Bring a current list of your medications and current insurance cards.  You MUST have a responsible person to  drive you home. Someone MUST be with you the first 24 hours after you arrive home or your discharge will be delayed. Wear clothes that are easy to get on and off and wear slip on shoes.    Coronary Angiogram A coronary angiogram, also called coronary angiography, is an X-ray procedure used to look at the arteries in the heart. In this procedure, a dye (contrast dye) is injected through a long, hollow tube (catheter). The catheter is about the size of a piece of cooked spaghetti and is inserted through your groin, wrist, or arm. The dye is injected into each artery, and X-rays are then taken to show if there is a blockage in the arteries of your heart.  LET [facility] CARE PROVIDER KNOW ABOUT: Any allergies you have, including allergies to shellfish or contrast dye.   All medicines you are taking, including vitamins, herbs, eye drops, creams, and over-the-counter medicines.   Previous problems you or members of your family have had with the use of anesthetics.   Any blood disorders you have.   Previous surgeries you have had. History of kidney problems or failure.   Other medical conditions you have.  RISKS AND COMPLICATIONS  Generally, a coronary angiogram is a safe procedure. However, about 1 person out of 1000 can have problems that may include: Allergic reaction to the dye. Bleeding/bruising from the access site or other locations. Kidney injury, especially in people  with impaired kidney function.  Stroke (rare). Heart attack (rare). Irregular rhythms (rare) Death (rare)  BEFORE THE PROCEDURE  Do not eat or drink anything after midnight the night before the procedure or as directed by your health care provider.   Ask your health care provider about changing or stopping your regular medicines. This is especially important if you are taking diabetes medicines or blood thinners.  PROCEDURE You may be given a medicine to help you relax (sedative) before the procedure. This medicine is  given through an intravenous (IV) access tube that is inserted into one of your veins.   The area where the catheter will be inserted will be washed and shaved. This is usually done in the groin but may be done in the fold of your arm (near your elbow) or in the wrist.    A medicine will be given to numb the area where the catheter will be inserted (local anesthetic).   The health care provider will insert the catheter into an artery. The catheter will be guided by using a special type of X-ray (fluoroscopy) of the blood vessel being examined.   A special dye will then be injected into the catheter, and X-rays will be taken. The dye will help to show where any narrowing or blockages are located in the heart arteries.     AFTER THE PROCEDURE  If the procedure is done through the leg, you will be kept in bed lying flat for several hours. You will be instructed to not bend or cross your legs. The insertion site will be checked frequently.   The pulse in your feet or wrist will be checked frequently.   Additional blood tests, X-rays, and an electrocardiogram may be done.   .  .   Call the [place] office at [phone] if you have any questions, problems or concerns.

## 2018-11-28 LAB — CBC
Hematocrit: 40.1 % (ref 37.5–51.0)
Hemoglobin: 14.1 g/dL (ref 13.0–17.7)
MCH: 31.3 pg (ref 26.6–33.0)
MCHC: 35.2 g/dL (ref 31.5–35.7)
MCV: 89 fL (ref 79–97)
Platelets: 233 10*3/uL (ref 150–450)
RBC: 4.5 x10E6/uL (ref 4.14–5.80)
RDW: 13.4 % (ref 11.6–15.4)
WBC: 6.4 10*3/uL (ref 3.4–10.8)

## 2018-11-28 LAB — COMPREHENSIVE METABOLIC PANEL
ALT: 14 IU/L (ref 0–44)
AST: 17 IU/L (ref 0–40)
Albumin/Globulin Ratio: 1.8 (ref 1.2–2.2)
Albumin: 4.2 g/dL (ref 3.6–4.8)
Alkaline Phosphatase: 79 IU/L (ref 39–117)
BUN/Creatinine Ratio: 15 (ref 10–24)
BUN: 18 mg/dL (ref 8–27)
Bilirubin Total: 0.3 mg/dL (ref 0.0–1.2)
CO2: 20 mmol/L (ref 20–29)
Calcium: 8.7 mg/dL (ref 8.6–10.2)
Chloride: 100 mmol/L (ref 96–106)
Creatinine, Ser: 1.2 mg/dL (ref 0.76–1.27)
GFR calc Af Amer: 73 mL/min/{1.73_m2} (ref >59)
GFR calc non Af Amer: 63 mL/min/{1.73_m2} (ref >59)
Globulin, Total: 2.3 g/dL (ref 1.5–4.5)
Glucose: 97 mg/dL (ref 65–99)
Potassium: 3.6 mmol/L (ref 3.5–5.2)
Sodium: 139 mmol/L (ref 134–144)
Total Protein: 6.5 g/dL (ref 6.0–8.5)

## 2018-11-28 LAB — PRO B NATRIURETIC PEPTIDE: NT-Pro BNP: 1683 pg/mL — ABNORMAL HIGH (ref 0–376)

## 2018-11-29 NOTE — Telephone Encounter (Signed)
Patient has a 10 week follow up appointment scheduled for 02/20/19. Patient understands she needs to keep this appointment for insurance compliance. Patient was grateful for the call and thanked me.

## 2018-11-30 ENCOUNTER — Telehealth: Payer: Self-pay | Admitting: *Deleted

## 2018-11-30 NOTE — Telephone Encounter (Signed)
Pt contacted pre-catheterization scheduled at [facility] for: Monday December 04, 2018 9 AM Verified arrival time and place: [place] Entrance A at: 7 AM  No solid food after midnight prior to cath, clear liquids until 5 AM day of procedure. Contrast allergy: no Verified no diabetes medications.  Hold: Eliquis-none 12/02/18 until post procedure.  Furosemide- AM of procedure. KCl-AM of procedure. Cialis-until post procedure.  Except hold medications AM meds can be  taken pre-cath with sip of water including: ASA 81 mg  Confirmed patient has responsible person to drive home post procedure and for 24 hours after you arrive home: yes

## 2018-12-04 ENCOUNTER — Encounter (HOSPITAL_COMMUNITY): Payer: Self-pay | Admitting: Interventional Cardiology

## 2018-12-04 ENCOUNTER — Ambulatory Visit (HOSPITAL_COMMUNITY)
Admission: RE | Admit: 2018-12-04 | Discharge: 2018-12-04 | Disposition: A | Discharge status: Home / Self Care | Payer: Managed Care, Other (non HMO) | Attending: Interventional Cardiology | Admitting: Interventional Cardiology

## 2018-12-04 ENCOUNTER — Other Ambulatory Visit: Payer: Self-pay

## 2018-12-04 ENCOUNTER — Encounter (HOSPITAL_COMMUNITY)
Admission: RE | Disposition: A | Discharge status: Home / Self Care | Payer: Self-pay | Source: Home / Self Care | Attending: Interventional Cardiology

## 2018-12-04 DIAGNOSIS — Z8249 Family history of ischemic heart disease and other diseases of the circulatory system: Secondary | ICD-10-CM | POA: Insufficient documentation

## 2018-12-04 DIAGNOSIS — Z7901 Long term (current) use of anticoagulants: Secondary | ICD-10-CM | POA: Diagnosis not present

## 2018-12-04 DIAGNOSIS — R0600 Dyspnea, unspecified: Secondary | ICD-10-CM

## 2018-12-04 DIAGNOSIS — J449 Chronic obstructive pulmonary disease, unspecified: Secondary | ICD-10-CM | POA: Insufficient documentation

## 2018-12-04 DIAGNOSIS — Z9852 Vasectomy status: Secondary | ICD-10-CM | POA: Diagnosis not present

## 2018-12-04 DIAGNOSIS — I480 Paroxysmal atrial fibrillation: Secondary | ICD-10-CM | POA: Diagnosis not present

## 2018-12-04 DIAGNOSIS — I471 Supraventricular tachycardia: Secondary | ICD-10-CM | POA: Diagnosis not present

## 2018-12-04 DIAGNOSIS — Z79899 Other long term (current) drug therapy: Secondary | ICD-10-CM | POA: Diagnosis not present

## 2018-12-04 DIAGNOSIS — I447 Left bundle-branch block, unspecified: Secondary | ICD-10-CM | POA: Diagnosis not present

## 2018-12-04 DIAGNOSIS — Z87891 Personal history of nicotine dependence: Secondary | ICD-10-CM | POA: Insufficient documentation

## 2018-12-04 DIAGNOSIS — I5042 Chronic combined systolic (congestive) and diastolic (congestive) heart failure: Secondary | ICD-10-CM | POA: Insufficient documentation

## 2018-12-04 DIAGNOSIS — I110 Hypertensive heart disease with heart failure: Secondary | ICD-10-CM | POA: Diagnosis not present

## 2018-12-04 DIAGNOSIS — Z8673 Personal history of transient ischemic attack (TIA), and cerebral infarction without residual deficits: Secondary | ICD-10-CM | POA: Diagnosis not present

## 2018-12-04 DIAGNOSIS — Z7951 Long term (current) use of inhaled steroids: Secondary | ICD-10-CM | POA: Diagnosis not present

## 2018-12-04 DIAGNOSIS — G4733 Obstructive sleep apnea (adult) (pediatric): Secondary | ICD-10-CM | POA: Diagnosis not present

## 2018-12-04 DIAGNOSIS — G4700 Insomnia, unspecified: Secondary | ICD-10-CM | POA: Insufficient documentation

## 2018-12-04 DIAGNOSIS — Z6841 Body Mass Index (BMI) 40.0 and over, adult: Secondary | ICD-10-CM | POA: Diagnosis not present

## 2018-12-04 HISTORY — PX: RIGHT/LEFT HEART CATH AND CORONARY ANGIOGRAPHY: CATH118266

## 2018-12-04 LAB — POCT I-STAT 3, VENOUS BLOOD GAS (G3P V)
Acid-Base Excess: 1 mmol/L (ref 0.0–2.0)
Acid-Base Excess: 2 mmol/L (ref 0.0–2.0)
Bicarbonate: 27.6 mmol/L (ref 20.0–28.0)
Bicarbonate: 27.6 mmol/L (ref 20.0–28.0)
O2 Saturation: 64 %
O2 Saturation: 71 %
TCO2: 29 mmol/L (ref 22–32)
TCO2: 29 mmol/L (ref 22–32)
pCO2, Ven: 45.9 mmHg (ref 44.0–60.0)
pCO2, Ven: 48.8 mmHg (ref 44.0–60.0)
pH, Ven: 7.361 (ref 7.250–7.430)
pH, Ven: 7.387 (ref 7.250–7.430)
pO2, Ven: 35 mmHg (ref 32.0–45.0)
pO2, Ven: 38 mmHg (ref 32.0–45.0)

## 2018-12-04 LAB — POCT I-STAT 3, ART BLOOD GAS (G3+)
Acid-Base Excess: 2 mmol/L (ref 0.0–2.0)
Bicarbonate: 27.8 mmol/L (ref 20.0–28.0)
O2 Saturation: 92 %
PO2 ART: 66 mmHg — AB (ref 83.0–108.0)
TCO2: 29 mmol/L (ref 22–32)
pCO2 arterial: 48.8 mmHg — ABNORMAL HIGH (ref 32.0–48.0)
pH, Arterial: 7.363 (ref 7.350–7.450)

## 2018-12-04 SURGERY — RIGHT/LEFT HEART CATH AND CORONARY ANGIOGRAPHY
Anesthesia: LOCAL

## 2018-12-04 MED ORDER — HEPARIN (PORCINE) IN NACL 1000-0.9 UT/500ML-% IV SOLN
INTRAVENOUS | Status: DC | PRN
  Administered 2018-12-04: 500 mL

## 2018-12-04 MED ORDER — SODIUM CHLORIDE 0.9 % IV SOLN: INTRAVENOUS | Status: DC

## 2018-12-04 MED ORDER — VERAPAMIL HCL 2.5 MG/ML IV SOLN
INTRAVENOUS | Status: DC | PRN
  Administered 2018-12-04: 09:00:00 via INTRA_ARTERIAL

## 2018-12-04 MED ORDER — OXYCODONE HCL 5 MG PO TABS: 5.0000 mg | ORAL_TABLET | ORAL | Status: DC | PRN

## 2018-12-04 MED ORDER — HEPARIN SODIUM (PORCINE) 1000 UNIT/ML IJ SOLN
INTRAMUSCULAR | Status: AC
  Filled 2018-12-04: qty 1

## 2018-12-04 MED ORDER — IOHEXOL 350 MG/ML SOLN
INTRAVENOUS | Status: DC | PRN
  Administered 2018-12-04: 45 mL via INTRA_ARTERIAL

## 2018-12-04 MED ORDER — SODIUM CHLORIDE 0.9 % IV SOLN
INTRAVENOUS | Status: DC
  Administered 2018-12-04: 08:00:00 via INTRAVENOUS

## 2018-12-04 MED ORDER — ACETAMINOPHEN 325 MG PO TABS: 650.0000 mg | ORAL_TABLET | ORAL | Status: DC | PRN

## 2018-12-04 MED ORDER — SODIUM CHLORIDE 0.9% FLUSH: 3.0000 mL | INTRAVENOUS | Status: DC | PRN

## 2018-12-04 MED ORDER — DIAZEPAM 5 MG PO TABS
10.0000 mg | ORAL_TABLET | ORAL | Status: AC
  Administered 2018-12-04: 10 mg via ORAL
  Filled 2018-12-04: qty 2

## 2018-12-04 MED ORDER — ASPIRIN 81 MG PO CHEW: 81.0000 mg | CHEWABLE_TABLET | ORAL | Status: DC

## 2018-12-04 MED ORDER — LIDOCAINE HCL (PF) 1 % IJ SOLN
INTRAMUSCULAR | Status: DC | PRN
  Administered 2018-12-04 (×2): 2 mL

## 2018-12-04 MED ORDER — HEPARIN SODIUM (PORCINE) 1000 UNIT/ML IJ SOLN
INTRAMUSCULAR | Status: DC | PRN
  Administered 2018-12-04: 6000 [IU] via INTRAVENOUS

## 2018-12-04 MED ORDER — ONDANSETRON HCL 4 MG/2ML IJ SOLN: 4.0000 mg | Freq: Four times a day (QID) | INTRAMUSCULAR | Status: DC | PRN

## 2018-12-04 MED ORDER — MIDAZOLAM HCL 2 MG/2ML IJ SOLN
INTRAMUSCULAR | Status: DC | PRN
  Administered 2018-12-04 (×2): 1 mg via INTRAVENOUS

## 2018-12-04 MED ORDER — LIDOCAINE HCL (PF) 1 % IJ SOLN
INTRAMUSCULAR | Status: AC
  Filled 2018-12-04: qty 30

## 2018-12-04 MED ORDER — FENTANYL CITRATE (PF) 100 MCG/2ML IJ SOLN
INTRAMUSCULAR | Status: DC | PRN
  Administered 2018-12-04 (×2): 50 ug via INTRAVENOUS

## 2018-12-04 MED ORDER — VERAPAMIL HCL 2.5 MG/ML IV SOLN
INTRAVENOUS | Status: AC
  Filled 2018-12-04: qty 2

## 2018-12-04 MED ORDER — SODIUM CHLORIDE 0.9% FLUSH: 3.0000 mL | Freq: Two times a day (BID) | INTRAVENOUS | Status: DC

## 2018-12-04 MED ORDER — FENTANYL CITRATE (PF) 100 MCG/2ML IJ SOLN
INTRAMUSCULAR | Status: AC
  Filled 2018-12-04: qty 2

## 2018-12-04 MED ORDER — HEPARIN (PORCINE) IN NACL 1000-0.9 UT/500ML-% IV SOLN
INTRAVENOUS | Status: AC
  Filled 2018-12-04: qty 1000

## 2018-12-04 MED ORDER — MIDAZOLAM HCL 2 MG/2ML IJ SOLN
INTRAMUSCULAR | Status: AC
  Filled 2018-12-04: qty 2

## 2018-12-04 MED ORDER — SODIUM CHLORIDE 0.9 % IV SOLN: 250.0000 mL | INTRAVENOUS | Status: DC | PRN

## 2018-12-04 SURGICAL SUPPLY — 12 items
CATH 5FR JL3.5 JR4 ANG PIG MP (CATHETERS) ×2 IMPLANT
CATH BALLN WEDGE 5F 110CM (CATHETERS) ×2 IMPLANT
DEVICE RAD COMP TR BAND LRG (VASCULAR PRODUCTS) ×2 IMPLANT
GLIDESHEATH SLEND A-KIT 6F 22G (SHEATH) ×2 IMPLANT
GUIDEWIRE INQWIRE 1.5J.035X260 (WIRE) ×1 IMPLANT
INQWIRE 1.5J .035X260CM (WIRE) ×2
KIT HEART LEFT (KITS) ×2 IMPLANT
PACK CARDIAC CATHETERIZATION (CUSTOM PROCEDURE TRAY) ×2 IMPLANT
SHEATH GLIDE SLENDER 4/5FR (SHEATH) ×2 IMPLANT
SHEATH PROBE COVER 6X72 (BAG) ×2 IMPLANT
TRANSDUCER W/STOPCOCK (MISCELLANEOUS) ×2 IMPLANT
TUBING CIL FLEX 10 FLL-RA (TUBING) ×2 IMPLANT

## 2018-12-04 NOTE — CV Procedure (Signed)
   Right and left heart cath from right arm using vascular ultrasound for access.  Clean coronaries.  LVEDP elevated.  Wedge pressure is normal.  Mild increase in right atrial pressure  No significant pulmonary hypertension  No immediate complications.

## 2018-12-04 NOTE — Interval H&P Note (Signed)
Cath Lab Visit (complete for each Cath Lab visit)  Clinical Evaluation Leading to the Procedure:   ACS: No.  Non-ACS:    Anginal Classification: CCS Foley  Anti-ischemic medical therapy: Maximal Therapy (2 or more classes of medications)  Non-Invasive Test Results: High-risk stress test findings: cardiac mortality >3%/year  Prior CABG: No previous CABG      History and Physical Interval Note:  12/04/2018 9:02 AM  Nicholas Foley  has presented today for surgery, with the diagnosis of chf  The various methods of treatment have been discussed with the patient and family. After consideration of risks, benefits and other options for treatment, the patient has consented to  Procedure(s): RIGHT/LEFT HEART CATH AND CORONARY ANGIOGRAPHY (N/A) as a surgical intervention .  The patient's history has been reviewed, patient examined, no change in status, stable for surgery.  I have reviewed the patient's chart and labs.  Questions were answered to the patient's satisfaction.     Nicholas Foley

## 2018-12-04 NOTE — Discharge Instructions (Signed)
Radial Site Care ° °This sheet gives you information about how to care for yourself after your procedure. Your health care provider may also give you more specific instructions. If you have problems or questions, contact your health care provider. °What can I expect after the procedure? °After the procedure, it is common to have: °· Bruising and tenderness at the catheter insertion area. °Follow these instructions at home: °Medicines °· Take over-the-counter and prescription medicines only as told by your health care provider. °Insertion site care °· Follow instructions from your health care provider about how to take care of your insertion site. Make sure you: °? Wash your hands with soap and water before you change your bandage (dressing). If soap and water are not available, use hand sanitizer. °? Change your dressing as told by your health care provider. °? Leave stitches (sutures), skin glue, or adhesive strips in place. These skin closures may need to stay in place for 2 weeks or longer. If adhesive strip edges start to loosen and curl up, you may trim the loose edges. Do not remove adhesive strips completely unless your health care provider tells you to do that. °· Check your insertion site every day for signs of infection. Check for: °? Redness, swelling, or pain. °? Fluid or blood. °? Pus or a bad smell. °? Warmth. °· Do not take baths, swim, or use a hot tub until your health care provider approves. °· You may shower 24-48 hours after the procedure, or as directed by your health care provider. °? Remove the dressing and gently wash the site with plain soap and water. °? Pat the area dry with a clean towel. °? Do not rub the site. That could cause bleeding. °· Do not apply powder or lotion to the site. °Activity ° °· For 24 hours after the procedure, or as directed by your health care provider: °? Do not flex or bend the affected arm. °? Do not push or pull heavy objects with the affected arm. °? Do not  drive yourself home from the hospital or clinic. You may drive 24 hours after the procedure unless your health care provider tells you not to. °? Do not operate machinery or power tools. °· Do not lift anything that is heavier than 10 lb (4.5 kg), or the limit that you are told, until your health care provider says that it is safe. °· Ask your health care provider when it is okay to: °? Return to work or school. °? Resume usual physical activities or sports. °? Resume sexual activity. °General instructions °· If the catheter site starts to bleed, raise your arm and put firm pressure on the site. If the bleeding does not stop, get help right away. This is a medical emergency. °· If you went home on the same day as your procedure, a responsible adult should be with you for the first 24 hours after you arrive home. °· Keep all follow-up visits as told by your health care provider. This is important. °Contact a health care provider if: °· You have a fever. °· You have redness, swelling, or yellow drainage around your insertion site. °Get help right away if: °· You have unusual pain at the radial site. °· The catheter insertion area swells very fast. °· The insertion area is bleeding, and the bleeding does not stop when you hold steady pressure on the area. °· Your arm or hand becomes pale, cool, tingly, or numb. °These symptoms may represent a serious problem   that is an emergency. Do not wait to see if the symptoms will go away. Get medical help right away. Call your local emergency services (911 in the [place] Do not drive yourself to the hospital. °Summary °· After the procedure, it is common to have bruising and tenderness at the site. °· Follow instructions from your health care provider about how to take care of your radial site wound. Check the wound every day for signs of infection. °· Do not lift anything that is heavier than 10 lb (4.5 kg), or the limit that you are told, until your health care provider says  that it is safe. °This information is not intended to replace advice given to you by your health care provider. Make sure you discuss any questions you have with your health care provider. °Document Released: 12/11/2010 Document Revised: 12/14/2017 Document Reviewed: 12/14/2017 °Elsevier Interactive Patient Education © 2019 [employer] ° °

## 2018-12-05 ENCOUNTER — Telehealth: Payer: Self-pay | Admitting: *Deleted

## 2018-12-05 DIAGNOSIS — R0600 Dyspnea, unspecified: Secondary | ICD-10-CM

## 2018-12-05 MED FILL — Heparin Sod (Porcine)-NaCl IV Soln 1000 Unit/500ML-0.9%: INTRAVENOUS | Qty: 500 | Status: AC

## 2018-12-05 NOTE — Telephone Encounter (Signed)
-----   Message from Belva Crome, MD sent at 12/04/2018  6:59 PM EST ----- Regarding: DOE Needs pulmonary consult to evaluate DOE and determine if has COPD.

## 2018-12-05 NOTE — Telephone Encounter (Signed)
Referral placed.

## 2018-12-08 ENCOUNTER — Encounter: Payer: Self-pay | Admitting: Internal Medicine

## 2018-12-08 ENCOUNTER — Ambulatory Visit: Payer: Managed Care, Other (non HMO) | Admitting: Internal Medicine

## 2018-12-08 VITALS — BP 134/82 | HR 83 | Ht 71.0 in | Wt 322.4 lb

## 2018-12-08 DIAGNOSIS — G4733 Obstructive sleep apnea (adult) (pediatric): Secondary | ICD-10-CM

## 2018-12-08 DIAGNOSIS — I480 Paroxysmal atrial fibrillation: Secondary | ICD-10-CM | POA: Diagnosis not present

## 2018-12-08 DIAGNOSIS — I447 Left bundle-branch block, unspecified: Secondary | ICD-10-CM

## 2018-12-08 DIAGNOSIS — I5032 Chronic diastolic (congestive) heart failure: Secondary | ICD-10-CM

## 2018-12-08 NOTE — Patient Instructions (Addendum)
Medication Instructions:  Your physician recommends that you continue on your current medications as directed. Please refer to the Current Medication list given to you today.  Labwork: None ordered.  Testing/Procedures: Your physician has recommended that you have a pacemaker inserted. A pacemaker is a small device that is placed under the skin of your chest or abdomen to help control abnormal heart rhythms. This device uses electrical pulses to prompt the heart to beat at a normal rate. Pacemakers are used to treat heart rhythms that are too slow. Wire (leads) are attached to the pacemaker that goes into the chambers of you heart. This is done in the hospital and usually requires and overnight stay. Please see the instruction sheet given to you today for more information.  Follow-Up: You will follow up with device clinic 10-14 days after your procedure for a wound check.  You will follow up with Dr. [name] 91 days after your procedure.   PACEMAKER INSTRUCTIONS:  Please arrive to ADMITTING down the hall from the [facility] main entrance of [place] [facility] at:  9:30 am on December 14, 2018 Use the CHG surgical scrub as directed Do not eat or drink after midnight prior to procedure Do not take any medications the morning of the procedure Hold your ELIQUIS for 24 hours prior to your procedure.  Your last dose will be December 13, 2018 your AM dose Plan for one night stay You will need someone to drive you home at discharge  If you need a refill on your cardiac medications before your next appointment, please call your pharmacy.    Biventricular Pacemaker Implantation A biventricular pacemaker implantation is a procedure to place (implant) a pacemaker into both of the lower chambers (ventricles) of the heart. A pacemaker is a small, battery-powered device that helps control the heartbeat. If the heart beats irregularly or too slowly (bradycardia), the pacemaker will pace the heart so  that it beats at a normal rate or a programmed rate. The parts of a biventricular pacemaker include:  The pulse generator. The pulse generator contains a small computer and a memory system that is programmed to keep the heart beating at a certain rate. The pulse generator also produces the electrical signal that triggers the heart to beat. This is implanted under the skin of the upper chest, near the collarbone.  Wires (leads). The leads are placed in the left and right ventricles of the heart. The leads are connected to the pulse generator. They transmit electrical pulses from the pulse generator to the heart. This procedure may be done to treat:  Bradycardia.  Symptoms of severe heart failure, such as shortness of breath (dyspnea).  Loss of consciousness that happens repeatedly (syncope) because of an irregular heart rate. Tell a health care provider about:  Any allergies you have.  All medicines you are taking, including vitamins, herbs, eye drops, creams, and over-the-counter medicines.  Any problems you or family members have had with anesthetic medicines.  Any blood disorders you have.  Any surgeries you have had.  Any medical conditions you have.  Whether you are pregnant or may be pregnant. What are the risks? Generally, this is a safe procedure. However, problems may occur, including:  Infection.  Bleeding.  Allergic reactions to medicines or dyes.  Damage to other structures or organs, such as your blood vessels, lungs, or heart.  Failure of the pacemaker to improve your condition. What happens before the procedure?  Ask your health care provider about: ?  Changing or stopping your regular medicines. This is especially important if you are taking diabetes medicines or blood thinners. ? Taking medicines such as aspirin and ibuprofen. These medicines can thin your blood. Do not take these medicines before your procedure if your health care provider instructs you not  to.  Follow instructions from your health care provider about eating or drinking restrictions.  Do not use any tobacco products for at least 24 hours before your procedure. This includes cigarettes, chewing tobacco, or e-cigarettes.  Ask your health care provider how your surgical site will be marked or identified.  You may be given antibiotic medicine to help prevent infection.  You may have tests, including: ? Blood tests. ? Chest X-rays.  Plan to have someone take you home after the procedure.  If you go home right after the procedure, plan to have someone with you for 24 hours. What happens during the procedure?  To reduce your risk of infection: ? Your health care team will wash or sanitize their hands. ? Your skin will be washed with soap. ? Hair may be removed from your surgical area.  An IV tube will be inserted into one of your veins.  You will be given one or more of the following: ? A medicine to help you relax (sedative). ? A medicine to make you fall asleep (general anesthetic). ? A medicine that is injected into your spine to numb the area below and slightly above the injection site (spinal anesthetic). ? A medicine that is injected into an area of your body to numb everything below the injection site (regional anesthetic).  An incision will be made in your upper chest, near your heart.  The leads will be guided into your incision, through your blood vessels, and into your ventricles. Your surgeon will use an X-ray machine (fluoroscope) to guide the leads into your heart.  The leads will be attached to your heart muscles and to the pulse generator.  The leads will be tested to make sure that they work correctly.  The pulse generator will be implanted under your skin, near your incision.  Your incision will be closed with stitches (sutures), skin glue, or adhesive tape.  A bandage (dressing) will be placed over your incision. The procedure may vary among  health care providers and hospitals. What happens after the procedure?  Your blood pressure, heart rate, breathing rate, and blood oxygen level will be monitored often until the medicines you were given have worn off.  You may continue to receive fluids and medicines through an IV tube.  You will have some pain. Pain medicines will be available to help you.  You will have a chest X-ray done. This is to make sure that your pacemaker is in the right place.  You may have to wear compression stockings. These stockings help to prevent blood clots and reduce swelling in your legs.  You will be given a pacemaker identification card. This card lists the implant date, device model, and manufacturer of your pacemaker.  Do not drive for 24 hours if you received a sedative. This information is not intended to replace advice given to you by your health care provider. Make sure you discuss any questions you have with your health care provider. Document Released: 08/02/2012 Document Revised: 06/26/2018 Document Reviewed: 08/03/2015 Elsevier Interactive Patient Education  2019 [employer].

## 2018-12-08 NOTE — H&P (View-Only) (Signed)
PCP: [name], MD Primary Cardiologist: Dr [name] Primary EP: Dr Noralee Chars is a 68 y.o. male who presents today for routine electrophysiology followup.  Since last being seen in our clinic, the patient reports doing reasonably well.  He continues to have fatigue, SOB and decreased exercise tolerance. Today, he denies symptoms of palpitations, chest pain, lower extremity edema, dizziness, presyncope, or syncope.  The patient is otherwise without complaint today.   Past Medical History:  Diagnosis Date  . Atrial fibrillation with rapid ventricular response ([place]) 08/02/2013  . Chronic systolic dysfunction of left ventricle 08/02/2013   BNP greater than 2000   . COPD (chronic obstructive pulmonary disease) (HCC)    Moderate by PFTs with response to bronchodilators, May 2014  . Erectile dysfunction   . Hypertension   . Insomnia 08/02/13  . Left bundle branch block 08/02/2013  . Morbid obesity ([place]) 08/02/2013  . OSA (obstructive sleep apnea)    severe OSA with AHI 74/hr now on CPAP at 8cm H2O   Past Surgical History:  Procedure Laterality Date  . CARDIOVERSION N/A 08/06/2013   Procedure: CARDIOVERSION;  Surgeon: Lelon Perla, MD;  Location: [facility] ENDOSCOPY;  Service: Cardiovascular;  Laterality: N/A;  spoke with [name]   . CARDIOVERSION N/A 09/03/2013   Procedure: CARDIOVERSION;  Surgeon: [name], MD;  Location: [place];  Service: Cardiovascular;  Laterality: N/A;  . NASAL SEPTUM SURGERY    . RIGHT/LEFT HEART CATH AND CORONARY ANGIOGRAPHY N/A 12/04/2018   Procedure: RIGHT/LEFT HEART CATH AND CORONARY ANGIOGRAPHY;  Surgeon: Belva Crome, MD;  Location: [place] CV LAB;  Service: Cardiovascular;  Laterality: N/A;  . TEE WITHOUT CARDIOVERSION N/A 08/06/2013   Procedure: TRANSESOPHAGEAL ECHOCARDIOGRAM (TEE);  Surgeon: Lelon Perla, MD;  Location: [facility] ENDOSCOPY;  Service: Cardiovascular;  Laterality: N/A;  . VASECTOMY      ROS- all systems are reviewed  and negatives except as per HPI above  Current Outpatient Medications  Medication Sig Dispense Refill  . acetaminophen (TYLENOL) 650 MG CR tablet Take 1,300 mg by mouth every 8 (eight) hours as needed for pain.     [name] apixaban (ELIQUIS) 5 MG TABS tablet Take 1 tablet (5 mg total) by mouth 2 (two) times daily. 180 tablet 3  . Carboxymethylcellul-Glycerin (LUBRICATING EYE DROPS OP) Place 1 drop into both eyes daily as needed (dry eyes).    . carvedilol (COREG) 6.25 MG tablet Take 1 tablet (6.25 mg total) by mouth 2 (two) times daily. 60 tablet 3  . cholecalciferol (VITAMIN D3) 25 MCG (1000 UT) tablet Take 1,000 Units by mouth daily.    [name] CIALIS 20 MG tablet Take 20 mg by mouth daily as needed for erectile dysfunction.   0  . fluticasone (FLONASE) 50 MCG/ACT nasal spray Place 1 spray into both nostrils 2 (two) times daily.   0  . Fluticasone-Salmeterol (ADVAIR) 250-50 MCG/DOSE AEPB Inhale 1 puff into the lungs 2 (two) times daily.     . furosemide (LASIX) 40 MG tablet Take 2 tablets (80 mg total) by mouth 2 (two) times daily. (Patient taking differently: Take 80-120 mg by mouth See admin instructions. Take 120 mg by mouth in the morning and 80 mg in the evening daily for 3 days then take 80 mg twice daily) 360 tablet 3  . Multiple Vitamins-Minerals (PRESERVISION AREDS 2 PO) Take 1 tablet by mouth 2 (two) times daily.     [name] omeprazole (PRILOSEC OTC) 20 MG tablet Take 20  mg by mouth daily.    . potassium chloride SA (K-DUR,KLOR-CON) 20 MEQ tablet TAKE 1 TABLET(20 MEQ) BY MOUTH TWICE DAILY (Patient taking differently: Take 20-40 mEq by mouth See admin instructions. Take 40 meq in the morning and 20 meq in the evening for 3 days, then take 20 meq twice daily) 60 tablet 11  . sacubitril-valsartan (ENTRESTO) 49-51 MG Take 1 tablet by mouth 2 (two) times daily. 60 tablet 11  . vitamin B-12 (CYANOCOBALAMIN) 1000 MCG tablet Take 1,000 mcg by mouth daily.    [name] zolpidem (AMBIEN CR) 12.5 MG CR tablet Take 1 tablet  by mouth at bedtime as needed for sleep.      No current facility-administered medications for this visit.     Physical Exam: Vitals:   12/08/18 1618  BP: 134/82  Pulse: 83  SpO2: 99%  Weight: (!) 322 lb 6.4 oz (146.2 kg)  Height: 5\' 11"  (1.803 m)   GEN- The patient is overweight appearing, alert and oriented x 3 today.   Head- normocephalic, atraumatic Eyes-  Sclera clear, conjunctiva pink Ears- hearing intact Oropharynx- clear Lungs- Clear to ausculation bilaterally, normal work of breathing Heart- Regular rate and rhythm, no murmurs, rubs or gallops, PMI not laterally displaced GI- soft, NT, ND, + BS Extremities- no clubbing, cyanosis, or edema  Wt Readings from Last 3 Encounters:  12/08/18 (!) 322 lb 6.4 oz (146.2 kg)  12/04/18 (!) 325 lb (147.4 kg)  11/27/18 (!) 325 lb 12.8 oz (147.8 kg)    EKG tracing ordered today is personally reviewed and shows sinus with LBBB (QRS 160 msec)  Assessment and Plan:  1. Nonischemic CM Cath 12/04/2018 personally reviewed with the patient He has normal cors. He has been treated with good medical therapy EF 35-40% with LBBB Dr [name] is worried about LBBB cardiomyopathy.  This is certainly a reasonable concern. The patient has symptomatic LBBB with nonischemic CM.  I would therefore recommend biventricular pacemaker implantation at this time.  He has EF >35% and therefore does not meet criteria for an ICD.  Risks, benefits, alternatives to BiV pacemaker implantation were discussed in detail with the patient today. The patient understands that the risks include but are not limited to bleeding, infection, pneumothorax, perforation, tamponade, vascular damage, renal failure, MI, stroke, death,  and lead dislodgement and wishes to proceed. We will therefore schedule the procedure at the next available time.  We will hold eliquis 24 hours prior to the procedure.  2. Morbid obesity Body mass index is 44.97 kg/m. lifesyle modification  encouraged Ultimately, he may benefit from bariatric surgery  3. afib Infrequent on monitor.  I do not feel that this is the cause for his cardiomyopathy Continue current medical therapy  4. OSA Compliance with CPAP advised  Thompson Grayer MD, [facility] 12/08/2018 4:19 PM

## 2018-12-08 NOTE — Progress Notes (Signed)
PCP: [name], MD Primary Cardiologist: Dr [name] Primary EP: Dr Noralee Chars is a 68 y.o. male who presents today for routine electrophysiology followup.  Since last being seen in our clinic, the patient reports doing reasonably well.  He continues to have fatigue, SOB and decreased exercise tolerance. Today, he denies symptoms of palpitations, chest pain, lower extremity edema, dizziness, presyncope, or syncope.  The patient is otherwise without complaint today.   Past Medical History:  Diagnosis Date  . Atrial fibrillation with rapid ventricular response ([place]) 08/02/2013  . Chronic systolic dysfunction of left ventricle 08/02/2013   BNP greater than 2000   . COPD (chronic obstructive pulmonary disease) (HCC)    Moderate by PFTs with response to bronchodilators, May 2014  . Erectile dysfunction   . Hypertension   . Insomnia 08/02/13  . Left bundle branch block 08/02/2013  . Morbid obesity ([place]) 08/02/2013  . OSA (obstructive sleep apnea)    severe OSA with AHI 74/hr now on CPAP at 8cm H2O   Past Surgical History:  Procedure Laterality Date  . CARDIOVERSION N/A 08/06/2013   Procedure: CARDIOVERSION;  Surgeon: Lelon Perla, MD;  Location[facility] ENDOSCOPY;  Service: Cardiovascular;  Laterality: N/A;  spoke with [name]   . CARDIOVERSION N/A 09/03/2013   Procedure: CARDIOVERSION;  Surgeon: [name], MD;  Location: [place];  Service: Cardiovascular;  Laterality: N/A;  . NASAL SEPTUM SURGERY    . RIGHT/LEFT HEART CATH AND CORONARY ANGIOGRAPHY N/A 12/04/2018   Procedure: RIGHT/LEFT HEART CATH AND CORONARY ANGIOGRAPHY;  Surgeon: Belva Crome, MD;  Location: [place] CV LAB;  Service: Cardiovascular;  Laterality: N/A;  . TEE WITHOUT CARDIOVERSION N/A 08/06/2013   Procedure: TRANSESOPHAGEAL ECHOCARDIOGRAM (TEE);  Surgeon: Lelon Perla, MD;  Location: [facility] ENDOSCOPY;  Service: Cardiovascular;  Laterality: N/A;  . VASECTOMY      ROS- all systems are reviewed  and negatives except as per HPI above  Current Outpatient Medications  Medication Sig Dispense Refill  . acetaminophen (TYLENOL) 650 MG CR tablet Take 1,300 mg by mouth every 8 (eight) hours as needed for pain.     [name] apixaban (ELIQUIS) 5 MG TABS tablet Take 1 tablet (5 mg total) by mouth 2 (two) times daily. 180 tablet 3  . Carboxymethylcellul-Glycerin (LUBRICATING EYE DROPS OP) Place 1 drop into both eyes daily as needed (dry eyes).    . carvedilol (COREG) 6.25 MG tablet Take 1 tablet (6.25 mg total) by mouth 2 (two) times daily. 60 tablet 3  . cholecalciferol (VITAMIN D3) 25 MCG (1000 UT) tablet Take 1,000 Units by mouth daily.    [name] CIALIS 20 MG tablet Take 20 mg by mouth daily as needed for erectile dysfunction.   0  . fluticasone (FLONASE) 50 MCG/ACT nasal spray Place 1 spray into both nostrils 2 (two) times daily.   0  . Fluticasone-Salmeterol (ADVAIR) 250-50 MCG/DOSE AEPB Inhale 1 puff into the lungs 2 (two) times daily.     . furosemide (LASIX) 40 MG tablet Take 2 tablets (80 mg total) by mouth 2 (two) times daily. (Patient taking differently: Take 80-120 mg by mouth See admin instructions. Take 120 mg by mouth in the morning and 80 mg in the evening daily for 3 days then take 80 mg twice daily) 360 tablet 3  . Multiple Vitamins-Minerals (PRESERVISION AREDS 2 PO) Take 1 tablet by mouth 2 (two) times daily.     [name] omeprazole (PRILOSEC OTC) 20 MG tablet Take 20  mg by mouth daily.    . potassium chloride SA (K-DUR,KLOR-CON) 20 MEQ tablet TAKE 1 TABLET(20 MEQ) BY MOUTH TWICE DAILY (Patient taking differently: Take 20-40 mEq by mouth See admin instructions. Take 40 meq in the morning and 20 meq in the evening for 3 days, then take 20 meq twice daily) 60 tablet 11  . sacubitril-valsartan (ENTRESTO) 49-51 MG Take 1 tablet by mouth 2 (two) times daily. 60 tablet 11  . vitamin B-12 (CYANOCOBALAMIN) 1000 MCG tablet Take 1,000 mcg by mouth daily.    [name] zolpidem (AMBIEN CR) 12.5 MG CR tablet Take 1 tablet  by mouth at bedtime as needed for sleep.      No current facility-administered medications for this visit.     Physical Exam: Vitals:   12/08/18 1618  BP: 134/82  Pulse: 83  SpO2: 99%  Weight: (!) 322 lb 6.4 oz (146.2 kg)  Height: 5\' 11"  (1.803 m)   GEN- The patient is overweight appearing, alert and oriented x 3 today.   Head- normocephalic, atraumatic Eyes-  Sclera clear, conjunctiva pink Ears- hearing intact Oropharynx- clear Lungs- Clear to ausculation bilaterally, normal work of breathing Heart- Regular rate and rhythm, no murmurs, rubs or gallops, PMI not laterally displaced GI- soft, NT, ND, + BS Extremities- no clubbing, cyanosis, or edema  Wt Readings from Last 3 Encounters:  12/08/18 (!) 322 lb 6.4 oz (146.2 kg)  12/04/18 (!) 325 lb (147.4 kg)  11/27/18 (!) 325 lb 12.8 oz (147.8 kg)    EKG tracing ordered today is personally reviewed and shows sinus with LBBB (QRS 160 msec)  Assessment and Plan:  1. Nonischemic CM Cath 12/04/2018 personally reviewed with the patient He has normal cors. He has been treated with good medical therapy EF 35-40% with LBBB Dr [name] is worried about LBBB cardiomyopathy.  This is certainly a reasonable concern. The patient has symptomatic LBBB with nonischemic CM.  I would therefore recommend biventricular pacemaker implantation at this time.  He has EF >35% and therefore does not meet criteria for an ICD.  Risks, benefits, alternatives to BiV pacemaker implantation were discussed in detail with the patient today. The patient understands that the risks include but are not limited to bleeding, infection, pneumothorax, perforation, tamponade, vascular damage, renal failure, MI, stroke, death,  and lead dislodgement and wishes to proceed. We will therefore schedule the procedure at the next available time.  We will hold eliquis 24 hours prior to the procedure.  2. Morbid obesity Body mass index is 44.97 kg/m. lifesyle modification  encouraged Ultimately, he may benefit from bariatric surgery  3. afib Infrequent on monitor.  I do not feel that this is the cause for his cardiomyopathy Continue current medical therapy  4. OSA Compliance with CPAP advised  Thompson Grayer MD, [facility] 12/08/2018 4:19 PM

## 2018-12-11 ENCOUNTER — Telehealth: Payer: Self-pay | Admitting: *Deleted

## 2018-12-11 NOTE — Telephone Encounter (Signed)
-----   Message from Sueanne Margarita, MD sent at 12/04/2018  9:37 PM EST ----- Patient feeling bad with cath looking ok.  Please get a PAP download to see what AHI is and if he is compliant.  Traci ----- Message ----- From: Belva Crome, MD Sent: 12/04/2018   5:38 PM EST To: Sueanne Margarita, MD  Cath does not demonstrate obstructive coronary disease.  Hemodynamics are relatively normal on current heart failure therapy.  He feels terrible and I wonder if sleep apnea is is being adequately treated.  Traci, he has a anyway to see him earlier than March

## 2018-12-11 NOTE — Addendum Note (Signed)
Addended by: Freada Bergeron on: 12/11/2018 05:42 PM   Modules accepted: Level of Service, SmartSet

## 2018-12-11 NOTE — Telephone Encounter (Signed)
This encounter was created in error - please disregard.

## 2018-12-11 NOTE — Telephone Encounter (Signed)
Download printed and sent to be scanned 

## 2018-12-12 ENCOUNTER — Telehealth: Payer: Self-pay | Admitting: *Deleted

## 2018-12-12 NOTE — Telephone Encounter (Signed)
Informed patient of compliance results and verbalized understanding was indicated. Patient is aware and agreeable to AHI being within range at 2.0. Patient is aware and agreeable to improving with machine usage. Patient states he uses his unit everyday and he has 7 hours and 45 min, 11 hours, and 11 hours 32 mins on his download.

## 2018-12-12 NOTE — Telephone Encounter (Signed)
-----   Message from Sueanne Margarita, MD sent at 12/12/2018 12:09 PM EST ----- Good AHI on PAP but needs to improve compliance

## 2018-12-14 ENCOUNTER — Encounter (HOSPITAL_COMMUNITY): Payer: Self-pay | Admitting: General Practice

## 2018-12-14 ENCOUNTER — Other Ambulatory Visit: Payer: Self-pay

## 2018-12-14 ENCOUNTER — Ambulatory Visit (HOSPITAL_COMMUNITY)
Admission: RE | Admit: 2018-12-14 | Discharge: 2018-12-15 | Disposition: A | Discharge status: Home / Self Care | Payer: Managed Care, Other (non HMO) | Attending: Internal Medicine | Admitting: Internal Medicine

## 2018-12-14 ENCOUNTER — Encounter (HOSPITAL_COMMUNITY)
Admission: RE | Disposition: A | Discharge status: Home / Self Care | Payer: Self-pay | Source: Home / Self Care | Attending: Internal Medicine

## 2018-12-14 DIAGNOSIS — I480 Paroxysmal atrial fibrillation: Secondary | ICD-10-CM | POA: Diagnosis not present

## 2018-12-14 DIAGNOSIS — Z9852 Vasectomy status: Secondary | ICD-10-CM | POA: Insufficient documentation

## 2018-12-14 DIAGNOSIS — I5022 Chronic systolic (congestive) heart failure: Secondary | ICD-10-CM | POA: Diagnosis not present

## 2018-12-14 DIAGNOSIS — Z955 Presence of coronary angioplasty implant and graft: Secondary | ICD-10-CM | POA: Diagnosis not present

## 2018-12-14 DIAGNOSIS — I509 Heart failure, unspecified: Secondary | ICD-10-CM | POA: Insufficient documentation

## 2018-12-14 DIAGNOSIS — I428 Other cardiomyopathies: Secondary | ICD-10-CM | POA: Diagnosis not present

## 2018-12-14 DIAGNOSIS — I110 Hypertensive heart disease with heart failure: Secondary | ICD-10-CM | POA: Diagnosis not present

## 2018-12-14 DIAGNOSIS — Z7901 Long term (current) use of anticoagulants: Secondary | ICD-10-CM | POA: Insufficient documentation

## 2018-12-14 DIAGNOSIS — G4733 Obstructive sleep apnea (adult) (pediatric): Secondary | ICD-10-CM | POA: Insufficient documentation

## 2018-12-14 DIAGNOSIS — I447 Left bundle-branch block, unspecified: Secondary | ICD-10-CM

## 2018-12-14 DIAGNOSIS — G4700 Insomnia, unspecified: Secondary | ICD-10-CM | POA: Insufficient documentation

## 2018-12-14 DIAGNOSIS — J449 Chronic obstructive pulmonary disease, unspecified: Secondary | ICD-10-CM | POA: Diagnosis not present

## 2018-12-14 DIAGNOSIS — Z7951 Long term (current) use of inhaled steroids: Secondary | ICD-10-CM | POA: Insufficient documentation

## 2018-12-14 DIAGNOSIS — Z6841 Body Mass Index (BMI) 40.0 and over, adult: Secondary | ICD-10-CM | POA: Insufficient documentation

## 2018-12-14 DIAGNOSIS — Z79899 Other long term (current) drug therapy: Secondary | ICD-10-CM | POA: Insufficient documentation

## 2018-12-14 DIAGNOSIS — Z959 Presence of cardiac and vascular implant and graft, unspecified: Secondary | ICD-10-CM

## 2018-12-14 HISTORY — PX: BIV PACEMAKER INSERTION CRT-P: EP1199

## 2018-12-14 HISTORY — DX: Heart failure, unspecified: I50.9

## 2018-12-14 LAB — SURGICAL PCR SCREEN
MRSA, PCR: NEGATIVE
Staphylococcus aureus: NEGATIVE

## 2018-12-14 SURGERY — BIV PACEMAKER INSERTION CRT-P

## 2018-12-14 MED ORDER — HYDROCODONE-ACETAMINOPHEN 5-325 MG PO TABS
1.0000 | ORAL_TABLET | ORAL | Status: DC | PRN
  Administered 2018-12-14: 2 via ORAL
  Administered 2018-12-14: 1 via ORAL
  Filled 2018-12-14: qty 1
  Filled 2018-12-14: qty 2

## 2018-12-14 MED ORDER — ONDANSETRON HCL 4 MG/2ML IJ SOLN: 4.0000 mg | Freq: Four times a day (QID) | INTRAMUSCULAR | Status: DC | PRN

## 2018-12-14 MED ORDER — SODIUM CHLORIDE 0.9 % IV SOLN: 250.0000 mL | INTRAVENOUS | Status: DC | PRN

## 2018-12-14 MED ORDER — FUROSEMIDE 80 MG PO TABS
80.0000 mg | ORAL_TABLET | Freq: Two times a day (BID) | ORAL | Status: DC
  Administered 2018-12-15: 10:00:00 80 mg via ORAL
  Filled 2018-12-14: qty 1

## 2018-12-14 MED ORDER — HEPARIN (PORCINE) IN NACL 1000-0.9 UT/500ML-% IV SOLN
INTRAVENOUS | Status: DC | PRN
  Administered 2018-12-14: 500 mL

## 2018-12-14 MED ORDER — MUPIROCIN 2 % EX OINT
1.0000 "application " | TOPICAL_OINTMENT | Freq: Once | CUTANEOUS | Status: AC
  Administered 2018-12-14: 1 via TOPICAL

## 2018-12-14 MED ORDER — SODIUM CHLORIDE 0.9 % IV SOLN
INTRAVENOUS | Status: AC
  Filled 2018-12-14: qty 2

## 2018-12-14 MED ORDER — ACETAMINOPHEN 325 MG PO TABS: 325.0000 mg | ORAL_TABLET | ORAL | Status: DC | PRN

## 2018-12-14 MED ORDER — FENTANYL CITRATE (PF) 100 MCG/2ML IJ SOLN
INTRAMUSCULAR | Status: AC
  Filled 2018-12-14: qty 2

## 2018-12-14 MED ORDER — SODIUM CHLORIDE 0.9% FLUSH
3.0000 mL | Freq: Two times a day (BID) | INTRAVENOUS | Status: DC
  Administered 2018-12-14 (×2): 3 mL via INTRAVENOUS

## 2018-12-14 MED ORDER — IOPAMIDOL (ISOVUE-370) INJECTION 76%
INTRAVENOUS | Status: AC
  Filled 2018-12-14: qty 50

## 2018-12-14 MED ORDER — POTASSIUM CHLORIDE CRYS ER 20 MEQ PO TBCR
20.0000 meq | EXTENDED_RELEASE_TABLET | Freq: Two times a day (BID) | ORAL | Status: DC
  Administered 2018-12-15: 10:00:00 20 meq via ORAL
  Filled 2018-12-14: qty 1

## 2018-12-14 MED ORDER — LIDOCAINE HCL 1 % IJ SOLN
INTRAMUSCULAR | Status: AC
  Filled 2018-12-14: qty 20

## 2018-12-14 MED ORDER — LIDOCAINE HCL (PF) 1 % IJ SOLN
INTRAMUSCULAR | Status: DC | PRN
  Administered 2018-12-14: 75 mL

## 2018-12-14 MED ORDER — SODIUM CHLORIDE 0.9% FLUSH: 3.0000 mL | INTRAVENOUS | Status: DC | PRN

## 2018-12-14 MED ORDER — SACUBITRIL-VALSARTAN 49-51 MG PO TABS
1.0000 | ORAL_TABLET | Freq: Two times a day (BID) | ORAL | Status: DC
  Administered 2018-12-14 – 2018-12-15 (×2): 1 via ORAL
  Filled 2018-12-14 (×2): qty 1

## 2018-12-14 MED ORDER — HEPARIN (PORCINE) IN NACL 1000-0.9 UT/500ML-% IV SOLN
INTRAVENOUS | Status: AC
  Filled 2018-12-14: qty 500

## 2018-12-14 MED ORDER — CEFAZOLIN SODIUM-DEXTROSE 1-4 GM/50ML-% IV SOLN
1.0000 g | Freq: Four times a day (QID) | INTRAVENOUS | Status: AC
  Administered 2018-12-14 – 2018-12-15 (×3): 1 g via INTRAVENOUS
  Filled 2018-12-14 (×3): qty 50

## 2018-12-14 MED ORDER — SODIUM CHLORIDE 0.9 % IV SOLN
INTRAVENOUS | Status: DC
  Administered 2018-12-14: 11:00:00 via INTRAVENOUS

## 2018-12-14 MED ORDER — LIDOCAINE HCL 1 % IJ SOLN
INTRAMUSCULAR | Status: AC
  Filled 2018-12-14: qty 60

## 2018-12-14 MED ORDER — YOU HAVE A PACEMAKER BOOK
Freq: Once | Status: AC
  Administered 2018-12-14: 15:00:00
  Filled 2018-12-14: qty 1

## 2018-12-14 MED ORDER — MIDAZOLAM HCL 5 MG/5ML IJ SOLN
INTRAMUSCULAR | Status: DC | PRN
  Administered 2018-12-14 (×2): 1 mg via INTRAVENOUS

## 2018-12-14 MED ORDER — FENTANYL CITRATE (PF) 100 MCG/2ML IJ SOLN
INTRAMUSCULAR | Status: DC | PRN
  Administered 2018-12-14 (×2): 12.5 ug via INTRAVENOUS

## 2018-12-14 MED ORDER — MUPIROCIN 2 % EX OINT
TOPICAL_OINTMENT | CUTANEOUS | Status: AC
  Administered 2018-12-14: 1 via TOPICAL
  Filled 2018-12-14: qty 22

## 2018-12-14 MED ORDER — ZOLPIDEM TARTRATE 5 MG PO TABS
5.0000 mg | ORAL_TABLET | Freq: Every evening | ORAL | Status: DC | PRN
  Administered 2018-12-14: 5 mg via ORAL
  Filled 2018-12-14: qty 1

## 2018-12-14 MED ORDER — SODIUM CHLORIDE 0.9 % IV SOLN
80.0000 mg | INTRAVENOUS | Status: AC
  Administered 2018-12-14: 80 mg

## 2018-12-14 MED ORDER — MIDAZOLAM HCL 5 MG/5ML IJ SOLN
INTRAMUSCULAR | Status: AC
  Filled 2018-12-14: qty 5

## 2018-12-14 MED ORDER — IOPAMIDOL (ISOVUE-370) INJECTION 76%
INTRAVENOUS | Status: DC | PRN
  Administered 2018-12-14: 10 mL via INTRAVENOUS

## 2018-12-14 MED ORDER — DEXTROSE 5 % IV SOLN
3.0000 g | INTRAVENOUS | Status: AC
  Administered 2018-12-14: 3 g via INTRAVENOUS
  Filled 2018-12-14: qty 3
  Filled 2018-12-14: qty 3000

## 2018-12-14 MED ORDER — CHLORHEXIDINE GLUCONATE 4 % EX LIQD: 60.0000 mL | Freq: Once | CUTANEOUS | Status: DC

## 2018-12-14 MED ORDER — CARVEDILOL 3.125 MG PO TABS
6.2500 mg | ORAL_TABLET | Freq: Two times a day (BID) | ORAL | Status: DC
  Administered 2018-12-14 – 2018-12-15 (×2): 6.25 mg via ORAL
  Filled 2018-12-14 (×2): qty 2

## 2018-12-14 SURGICAL SUPPLY — 14 items
ADAPTER SEALING SSA-EW-09 (MISCELLANEOUS) ×3 IMPLANT
CABLE SURGICAL S-101-97-12 (CABLE) ×3 IMPLANT
CATH ATTAIN COM SURV 6250V-MB2 (CATHETERS) ×3 IMPLANT
CATH HEXAPOLAR DAMATO 6F (CATHETERS) ×3 IMPLANT
DEVICE CRTP PERCEPTA QUAD MRI (Pacemaker) ×3 IMPLANT
LEAD ATTAIN PERFORMA S 4598-88 (Lead) ×3 IMPLANT
LEAD CAPSURE NOVUS 5076-52CM (Lead) ×3 IMPLANT
LEAD CAPSURE NOVUS 5076-58CM (Lead) ×3 IMPLANT
PAD PRO RADIOLUCENT 2001M-C (PAD) ×3 IMPLANT
SHEATH CLASSIC 7F (SHEATH) ×6 IMPLANT
SHEATH CLASSIC 9.5F (SHEATH) ×3 IMPLANT
SLITTER 6232ADJ (MISCELLANEOUS) ×3 IMPLANT
TRAY PACEMAKER INSERTION (PACKS) ×3 IMPLANT
WIRE ACUITY WHISPER EDS 4648 (WIRE) ×6 IMPLANT

## 2018-12-14 NOTE — Interval H&P Note (Signed)
History and Physical Interval Note:  12/14/2018 11:07 AM  [place]  has presented today for surgery, with the diagnosis of cardiomyopathy  The various methods of treatment have been discussed with the patient and family. After consideration of risks, benefits and other options for treatment, the patient has consented to  Procedure(s): BIV PACEMAKER INSERTION CRT-P (N/A) as a surgical intervention .  The patient's history has been reviewed, patient examined, no change in status, stable for surgery.  I have reviewed the patient's chart and labs.  Questions were answered to the patient's satisfaction.    Risks, benefits, alternatives to biventricular pacemaker implantation were discussed in detail with the patient today. The patient understands that the risks include but are not limited to bleeding, infection, pneumothorax, perforation, tamponade, vascular damage, renal failure, MI, stroke, death,  and lead dislodgement and wishes to proceed at this time.  Thompson Grayer MD, [place] 12/14/2018 11:08 AM

## 2018-12-15 ENCOUNTER — Encounter (HOSPITAL_COMMUNITY): Payer: Self-pay | Admitting: Internal Medicine

## 2018-12-15 ENCOUNTER — Ambulatory Visit (HOSPITAL_COMMUNITY): Payer: Managed Care, Other (non HMO)

## 2018-12-15 DIAGNOSIS — I428 Other cardiomyopathies: Secondary | ICD-10-CM | POA: Diagnosis not present

## 2018-12-15 DIAGNOSIS — I447 Left bundle-branch block, unspecified: Secondary | ICD-10-CM | POA: Diagnosis not present

## 2018-12-15 LAB — BASIC METABOLIC PANEL
Anion gap: 7 (ref 5–15)
BUN: 18 mg/dL (ref 8–23)
CO2: 25 mmol/L (ref 22–32)
CREATININE: 1.24 mg/dL (ref 0.61–1.24)
Calcium: 8.6 mg/dL — ABNORMAL LOW (ref 8.9–10.3)
Chloride: 104 mmol/L (ref 98–111)
GFR calc Af Amer: >60 mL/min (ref >60)
GFR calc non Af Amer: >60 mL/min (ref >60)
Glucose, Bld: 95 mg/dL (ref 70–99)
Potassium: 3.7 mmol/L (ref 3.5–5.1)
Sodium: 136 mmol/L (ref 135–145)

## 2018-12-15 MED FILL — Lidocaine HCl Local Inj 1%: INTRAMUSCULAR | Qty: 40 | Status: AC

## 2018-12-15 MED FILL — Lidocaine HCl Local Inj 1%: INTRAMUSCULAR | Qty: 60 | Status: AC

## 2018-12-15 NOTE — Discharge Instructions (Signed)
° ° °  Supplemental Discharge Instructions for  Pacemaker/Defibrillator Patients  Activity No heavy lifting or vigorous activity with your left/right arm for 6 to 8 weeks.  Do not raise your left/right arm above your head for one week.  Gradually raise your affected arm as drawn below.             12/18/2018                 12/19/2018                12/20/2018               12/21/2018 __  NO DRIVING for  1 week   ; you may begin driving on   05/20/4764  .  WOUND CARE - Keep the wound area clean and dry.  Do not get this area wet, no showers until cleared to at your wound check visit - The tape/steri-strips on your wound will fall off; do not pull them off.  No bandage is needed on the site.  DO  NOT apply any creams, oils, or ointments to the wound area. - If you notice any drainage or discharge from the wound, any swelling or bruising at the site, or you develop a fever > 101? F after you are discharged home, call the office at once.  Special Instructions - You are still able to use cellular telephones; use the ear opposite the side where you have your pacemaker/defibrillator.  Avoid carrying your cellular phone near your device. - When traveling through airports, show security personnel your identification card to avoid being screened in the metal detectors.  Ask the security personnel to use the hand wand. - Avoid arc welding equipment, MRI testing (magnetic resonance imaging), TENS units (transcutaneous nerve stimulators).  Call the office for questions about other devices. - Avoid electrical appliances that are in poor condition or are not properly grounded. - Microwave ovens are safe to be near or to operate.

## 2018-12-15 NOTE — Discharge Summary (Addendum)
ELECTROPHYSIOLOGY PROCEDURE DISCHARGE SUMMARY    Patient ID: Nicholas Foley,  MRN: [id number], DOB/AGE: [birth year] 68 y.o.  Admit date: 12/14/2018 Discharge date: 12/15/2018  Primary Care Physician: [name], MD  Primary Cardiologist: Dr. [name] Electrophysiologist: Dr. [name]  Primary Discharge Diagnosis:  1. LBBB 2. Chronic CHF (combined) 3. NICM  Secondary Discharge Diagnosis:  1. Paroxysmal AFib     CHA2DS2Vasc is 2, on Eliquis appropriately dosed 2. Morbid obesity 3. OSA w/CPAP 4. HTN   No Known Allergies   Procedures This Admission:  1.  Implantation of a MDT CRT-P on 12/14/2018 by Dr [name].  The patient received a Medtronic model [id number] (serial # [id number]) right atrial lead and a Medtronic model [id number] (serial number [id number]) right ventricular lead, Medtronic model 1975 - 88 (serial number [id number]) lead (LV), Medtronic model [id number] Percepta Quad CRT-P MRI Surescan (serial Number [id number]) device There were no immediate post procedure complications. 2.  CXR on 12/15/2018 demonstrated no pneumothorax status post device implantation.   Brief HPI: Nicholas Foley is a 68 y.o. male was referred to electrophysiology in the outpatient setting for consideration of CRT therapy.  Past medical history includes above.  Risks, benefits, and alternatives to PPM implantation were reviewed with the patient who wished to proceed.   Hospital Course:  The patient was admitted and underwent implantation of a CRT-P with details as outlined above.  He  was monitored on telemetry overnight which demonstrated SR, V paced rhythm.  Left chest was without hematoma or ecchymosis.  The device was interrogated and found to be functioning normally.  CXR was obtained and demonstrated no pneumothorax status post device implantation.  Wound care, arm mobility, and restrictions were reviewed with the patient.  The patient feels well this morning, no CP or SOB, minimal site discomfort.  He  was examined by Dr. [name] and considered stable for discharge to home.   Resume Eliquis on Monday 12/18/2018   Physical Exam: Vitals:   12/14/18 1420 12/14/18 1946 12/14/18 2000 12/15/18 0358  BP: (!) 165/84 131/80  133/74  Pulse: 76     Resp: (!) 35 20  18  Temp: 98 F (36.7 C) 98.3 F (36.8 C)  97.7 F (36.5 C)  TempSrc: Oral Oral  Oral  SpO2: 97% 94% 94% 95%  Weight:    (!) 145.7 kg  Height:        GEN- The patient is well appearing, alert and oriented x 3 today.   HEENT: normocephalic, atraumatic; sclera clear, conjunctiva pink; hearing intact; oropharynx clear; neck supple, no JVP Lungs- CTA b/l, normal work of breathing.  No wheezes, rales, rhonchi Heart- RRR, no murmurs, rubs or gallops, PMI not laterally displaced GI- soft, non-tender, non-distended, Extremities- no clubbing, cyanosis, or edema MS- no significant deformity or atrophy Skin- warm and dry, no rash or lesion,  left chest without hematoma/ecchymosis Psych- euthymic mood, full affect Neuro- no gross deficits   Labs:   Lab Results  Component Value Date   WBC 6.4 11/27/2018   HGB 14.1 11/27/2018   HCT 40.1 11/27/2018   MCV 89 11/27/2018   PLT 233 11/27/2018    Recent Labs  Lab 12/15/18 0605  NA 136  K 3.7  CL 104  CO2 25  BUN 18  CREATININE 1.24  CALCIUM 8.6*  GLUCOSE 95    Discharge Medications:  Allergies as of 12/15/2018   No Known Allergies     Medication List    TAKE these medications  acetaminophen 650 MG CR tablet Commonly known as:  TYLENOL Take 1,300 mg by mouth every 8 (eight) hours as needed for pain.   apixaban 5 MG Tabs tablet Commonly known as:  ELIQUIS Take 1 tablet (5 mg total) by mouth 2 (two) times daily. Notes to patient:  Resume on Monday 12/18/2018 morning   carvedilol 6.25 MG tablet Commonly known as:  COREG Take 1 tablet (6.25 mg total) by mouth 2 (two) times daily.   cholecalciferol 25 MCG (1000 UT) tablet Commonly known as:  VITAMIN D3 Take 1,000  Units by mouth daily.   CIALIS 20 MG tablet Generic drug:  tadalafil Take 20 mg by mouth daily as needed for erectile dysfunction.   fluticasone 50 MCG/ACT nasal spray Commonly known as:  FLONASE Place 1 spray into both nostrils 2 (two) times daily.   Fluticasone-Salmeterol 250-50 MCG/DOSE Aepb Commonly known as:  ADVAIR Inhale 1 puff into the lungs 2 (two) times daily.   furosemide 40 MG tablet Commonly known as:  LASIX Take 2 tablets (80 mg total) by mouth 2 (two) times daily. What changed:    how much to take  when to take this  additional instructions   LUBRICATING EYE DROPS OP Place 1 drop into both eyes daily as needed (dry eyes).   omeprazole 20 MG tablet Commonly known as:  PRILOSEC OTC Take 20 mg by mouth daily.   potassium chloride SA 20 MEQ tablet Commonly known as:  K-DUR,KLOR-CON TAKE 1 TABLET(20 MEQ) BY MOUTH TWICE DAILY What changed:  See the new instructions.   PRESERVISION AREDS 2 PO Take 1 tablet by mouth 2 (two) times daily.   sacubitril-valsartan 49-51 MG Commonly known as:  ENTRESTO Take 1 tablet by mouth 2 (two) times daily.   vitamin B-12 1000 MCG tablet Commonly known as:  CYANOCOBALAMIN Take 1,000 mcg by mouth daily.   zolpidem 12.5 MG CR tablet Commonly known as:  AMBIEN CR Take 1 tablet by mouth at bedtime as needed for sleep.       Disposition:  Home  Follow-up Information    Baldwin Jamaica, PA-C Follow up.   Specialty:  Cardiology Why:  12/27/2018 @ 10:15AM, wound check visit Contact information: [place] [address] [phone]        Thompson Grayer, MD Follow up.   Specialty:  Cardiology Why:  03/19/2019 @ 2:15PM Contact information: [place] [address] [phone]           Duration of Discharge Encounter: Greater than 30 minutes including physician time.  Venetia Night, PA-C 12/15/2018 8:42 AM  Doing well s/p BiV pacemaker. Device  interrogation is personally reviewed and normal CXR reveals stable leads, no ptx. DC to home today  Routine wound care and follow-up  Thompson Grayer MD, [place] 12/15/2018 8:51 AM

## 2018-12-27 ENCOUNTER — Ambulatory Visit (INDEPENDENT_AMBULATORY_CARE_PROVIDER_SITE_OTHER): Payer: Managed Care, Other (non HMO) | Admitting: Nurse Practitioner

## 2018-12-27 DIAGNOSIS — I5042 Chronic combined systolic (congestive) and diastolic (congestive) heart failure: Secondary | ICD-10-CM

## 2018-12-27 LAB — CUP PACEART INCLINIC DEVICE CHECK
Date Time Interrogation Session: 20200205100916
Implantable Lead Implant Date: 20200123
Implantable Lead Implant Date: 20200123
Implantable Lead Location: 753858
Implantable Lead Location: 753859
Implantable Lead Location: 753860
Implantable Lead Model: 4598
Implantable Lead Model: 5076
Implantable Lead Model: 5076
Implantable Pulse Generator Implant Date: 20200123
MDC IDC LEAD IMPLANT DT: 20200123

## 2018-12-27 NOTE — Progress Notes (Signed)
Wound check appointment. Steri-strips removed. Wound without redness or edema. Incision edges approximated, wound well healed. Normal device function. Thresholds, sensing, and impedances consistent with implant measurements. Device programmed at 3.5V/auto capture programmed on for extra safety margin until 3 month visit. Histogram distribution appropriate for patient and level of activity. No mode switches noted. 2 NSVT episodes. Patient educated about wound care, arm mobility, lifting restrictions. ROV in 3 months with implanting physician. Pt is significantly symptomatically improved post CRT implant.

## 2019-01-04 ENCOUNTER — Encounter: Payer: Self-pay | Admitting: Pulmonary Disease

## 2019-01-04 ENCOUNTER — Ambulatory Visit: Payer: Managed Care, Other (non HMO) | Admitting: Pulmonary Disease

## 2019-01-04 VITALS — BP 160/80 | HR 93 | Ht 71.0 in | Wt 320.2 lb

## 2019-01-04 DIAGNOSIS — J449 Chronic obstructive pulmonary disease, unspecified: Secondary | ICD-10-CM | POA: Diagnosis not present

## 2019-01-04 NOTE — Progress Notes (Signed)
Nicholas Foley    [id number]    1951/08/28  Primary Care Physician:Webb, Arbie Cookey, MD  Referring Physician: Belva Crome, MD [address] [place], [place] [address]  Chief complaint: Consult for dyspnea  HPI: 68 year old with history of fibrillation, CHF, COPD, morbid obesity, severe OSA Sent here for evaluation of his lungs.  He has got obstruction by PFTs in 2014.  Maintained on fluticasone, salmeterol inhaler which is generic form of Advair. He has had significant issues with his heart recently with atrial fibrillation, chronic CHF, nonischemic cardiomyopathy [EF 35-40%].  Had implantation of a biventricular pacemaker in January 2019.  He has severe OSA and is followed by Dr. Radford Pax, cardiology  Chief complaint today is dyspnea on exertion, he has symptoms sometimes at rest.  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  Quit in 2013 Travel history: No significant travel Relevant family history: Significant family history of emphysema  Outpatient Encounter Medications as of 01/04/2019  Medication Sig  . acetaminophen (TYLENOL) 650 MG CR tablet Take 1,300 mg by mouth every 8 (eight) hours as needed for pain.   [name] apixaban (ELIQUIS) 5 MG TABS tablet Take 1 tablet (5 mg total) by mouth 2 (two) times daily.  . Carboxymethylcellul-Glycerin (LUBRICATING EYE DROPS OP) Place 1 drop into both eyes daily as needed (dry eyes).  . carvedilol (COREG) 6.25 MG tablet Take 1 tablet (6.25 mg total) by mouth 2 (two) times daily.  . cholecalciferol (VITAMIN D3) 25 MCG (1000 UT) tablet Take 1,000 Units by mouth daily.  [name] CIALIS 20 MG tablet Take 20 mg by mouth daily as needed for erectile dysfunction.   . fluticasone (FLONASE) 50 MCG/ACT nasal spray Place 1 spray into both nostrils 2 (two) times daily.   .  Fluticasone-Salmeterol (ADVAIR) 250-50 MCG/DOSE AEPB Inhale 1 puff into the lungs 2 (two) times daily.   . furosemide (LASIX) 40 MG tablet Take 2 tablets (80 mg total) by mouth 2 (two) times daily. (Patient taking differently: Take 80-120 mg by mouth See admin instructions. Take 120 mg by mouth in the morning and 80 mg in the evening daily for 3 days then take 80 mg twice daily)  . Multiple Vitamins-Minerals (PRESERVISION AREDS 2 PO) Take 1 tablet by mouth 2 (two) times daily.   [name] omeprazole (PRILOSEC OTC) 20 MG tablet Take 20 mg by mouth daily.  . potassium chloride SA (K-DUR,KLOR-CON) 20 MEQ tablet TAKE 1 TABLET(20 MEQ) BY MOUTH TWICE DAILY (Patient taking differently: Take 20-40 mEq by mouth See admin instructions. Take 40 meq in the morning and 20 meq in the evening for 3 days, then take 20 meq twice daily)  . sacubitril-valsartan (ENTRESTO) 49-51 MG Take 1 tablet by mouth 2 (two) times daily.  . vitamin B-12 (CYANOCOBALAMIN) 1000 MCG tablet Take 1,000 mcg by mouth daily.  [name] zolpidem (AMBIEN CR) 12.5 MG CR tablet Take 1 tablet by mouth at bedtime as needed for sleep.    No facility-administered encounter medications on file as of 01/04/2019.     Allergies as of 01/04/2019  . (No Known Allergies)    Past Medical History:  Diagnosis Date  . Atrial fibrillation with rapid ventricular response ([place]) 08/02/2013  . CHF (congestive heart failure) ([place])   . Chronic systolic dysfunction of left ventricle 08/02/2013  BNP greater than 2000   . COPD (chronic obstructive pulmonary disease) ([facility])    Moderate by PFTs with response to bronchodilators, May 2014  . Erectile dysfunction   . Hypertension   . Insomnia 08/02/13  . Left bundle branch block 08/02/2013  . Morbid obesity ([place]) 08/02/2013  . OSA (obstructive sleep apnea)    severe OSA with AHI 74/hr now on CPAP at 8cm H2O  . Presence of permanent cardiac pacemaker 12/14/2018    Past Surgical History:  Procedure Laterality Date  . BIV  PACEMAKER INSERTION CRT-P N/A 12/14/2018   Procedure: BIV PACEMAKER INSERTION CRT-P;  Surgeon: Thompson Grayer, MD;  Location: [facility];  Service: Cardiovascular;  Laterality: N/A;  . CARDIOVERSION N/A 08/06/2013   Procedure: CARDIOVERSION;  Surgeon: Lelon Perla, MD;  Location: [facility] ENDOSCOPY;  Service: Cardiovascular;  Laterality: N/A;  spoke with [name]   . CARDIOVERSION N/A 09/03/2013   Procedure: CARDIOVERSION;  Surgeon: [name], MD;  Location: [facility] ENDOSCOPY;  Service: Cardiovascular;  Laterality: N/A;  . INSERT / REPLACE / REMOVE PACEMAKER  12/14/2018   BIV  . NASAL SEPTUM SURGERY    . RIGHT/LEFT HEART CATH AND CORONARY ANGIOGRAPHY N/A 12/04/2018   Procedure: RIGHT/LEFT HEART CATH AND CORONARY ANGIOGRAPHY;  Surgeon: Belva Crome, MD;  Location: [facility];  Service: Cardiovascular;  Laterality: N/A;  . TEE WITHOUT CARDIOVERSION N/A 08/06/2013   Procedure: TRANSESOPHAGEAL ECHOCARDIOGRAM (TEE);  Surgeon: Lelon Perla, MD;  Location: [facility] ENDOSCOPY;  Service: Cardiovascular;  Laterality: N/A;  . VASECTOMY      Family History  Problem Relation Age of Onset  . Atrial fibrillation Mother   . COPD Father   . Cerebral aneurysm Brother   . Healthy Daughter     Social History   Socioeconomic History  . Marital status: Married    Spouse name: Not on file  . Number of children: Not on file  . Years of education: Not on file  . Highest education level: Not on file  Occupational History  . Occupation: real estate    Comment: investment  Social Needs  . Financial resource strain: Not on file  . Food insecurity:    Worry: Not on file    Inability: Not on file  . Transportation needs:    Medical: Not on file    Non-medical: Not on file  Tobacco Use  . Smoking status: Former Smoker    Packs/day: 1.00    Years: 30.00    Pack years: 30.00    Types: Cigarettes, Cigars    Last attempt to quit: 08/02/2012    Years since quitting: 6.4  . Smokeless tobacco:  Never Used  Substance and Sexual Activity  . Alcohol use: Yes    Alcohol/week: 0.0 standard drinks    Comment: once a week; previously 3-4 times per week   . Drug use: No  . Sexual activity: Yes  Lifestyle  . Physical activity:    Days per week: Not on file    Minutes per session: Not on file  . Stress: Not on file  Relationships  . Social connections:    Talks on phone: Not on file    Gets together: Not on file    Attends religious service: Not on file    Active member of club or organization: Not on file    Attends meetings of clubs or organizations: Not on file    Relationship status: Not on file  . Intimate partner violence:  Fear of current or ex partner: Not on file    Emotionally abused: Not on file    Physically abused: Not on file    Forced sexual activity: Not on file  Other Topics Concern  . Not on file  Social History Narrative   Works as a Engineer, structural for apartments   Lives with wife.  They have one grown daughter   Highest level of education:  college    Review of systems: Review of Systems  Constitutional: Negative for fever and chills.  HENT: Negative.   Eyes: Negative for blurred vision.  Respiratory: as per HPI  Cardiovascular: Negative for chest pain and palpitations.  Gastrointestinal: Negative for vomiting, diarrhea, blood per rectum. Genitourinary: Negative for dysuria, urgency, frequency and hematuria.  Musculoskeletal: Negative for myalgias, back pain and joint pain.  Skin: Negative for itching and rash.  Neurological: Negative for dizziness, tremors, focal weakness, seizures and loss of consciousness.  Endo/Heme/Allergies: Negative for environmental allergies.  Psychiatric/Behavioral: Negative for depression, suicidal ideas and hallucinations.  All other systems reviewed and are negative.  Physical Exam: Blood pressure (!) 160/80, pulse 93, height 5\' 11"  (1.803 m), weight (!) 320 lb 3.2 oz (145.2 kg), SpO2 97 %. Gen:      No  acute distress HEENT:  EOMI, sclera anicteric Neck:     No masses; no thyromegaly Lungs:    Clear to auscultation bilaterally; normal respiratory effort CV:         Regular rate and rhythm; no murmurs Abd:      + bowel sounds; soft, non-tender; no palpable masses, no distension Ext:    No edema; adequate peripheral perfusion Skin:      Warm and dry; no rash Neuro: alert and oriented x 3 Psych: normal mood and affect  Data Reviewed: Imaging: Chest x-ray 12/15/2018- biventricular pacemaker, no acute pulmonary abnormality I have reviewed the images personally  PFTs: 03/26/2013 FVC 4.37 [94%], FEV1 2.65 [81%), F/F 61, TLC 121%, DLCO 90% Mild obstructive airway disease with hyperinflation and air trapping.  No bronchodilator response  Assessment:  Evaluation for COPD Has mild COPD by PFTs in 2014 Will reevaluate by repeat lung function test Check CBC with differential, IgE. As he has significant family history of emphysema will check alpha-1 antitrypsin levels and phenotype  Continue current inhaler therapy until we can review these tests.  Plan/Recommendations: - CBC with differential, IgE, alpha-1 antitrypsin - PFTs  Marshell Garfinkel MD [facility] 01/04/2019, 4:30 PM  CC: Belva Crome, MD

## 2019-01-04 NOTE — Patient Instructions (Signed)
We will check a CBC with differential, IgE, alpha-1 antitrypsin levels and phenotype We will schedule you for pulmonary function test Return to clinic in 4 weeks for review of tests and plan for next steps.

## 2019-01-05 LAB — CBC WITH DIFFERENTIAL/PLATELET
Basophils Absolute: 0.1 10*3/uL (ref 0.0–0.1)
Basophils Relative: 0.9 % (ref 0.0–3.0)
Eosinophils Absolute: 0.2 10*3/uL (ref 0.0–0.7)
Eosinophils Relative: 2.1 % (ref 0.0–5.0)
HCT: 43 % (ref 39.0–52.0)
Hemoglobin: 14.4 g/dL (ref 13.0–17.0)
Lymphocytes Relative: 21.4 % (ref 12.0–46.0)
Lymphs Abs: 1.6 10*3/uL (ref 0.7–4.0)
MCHC: 33.6 g/dL (ref 30.0–36.0)
MCV: 91.6 fl (ref 78.0–100.0)
MONOS PCT: 9.2 % (ref 3.0–12.0)
Monocytes Absolute: 0.7 10*3/uL (ref 0.1–1.0)
Neutro Abs: 5.1 10*3/uL (ref 1.4–7.7)
Neutrophils Relative %: 66.4 % (ref 43.0–77.0)
Platelets: 222 10*3/uL (ref 150.0–400.0)
RBC: 4.69 Mil/uL (ref 4.22–5.81)
RDW: 14 % (ref 11.5–15.5)
WBC: 7.7 10*3/uL (ref 4.0–10.5)

## 2019-01-11 LAB — ALPHA-1 ANTITRYPSIN PHENOTYPE: A-1 Antitrypsin, Ser: 161 mg/dL (ref 83–199)

## 2019-01-11 LAB — IGE: IgE (Immunoglobulin E), Serum: 857 kU/L — ABNORMAL HIGH (ref <=114)

## 2019-01-24 NOTE — Progress Notes (Deleted)
Cardiology Office Note:    Date:  01/24/2019   ID:  Nicholas Foley, DOB [birth year], MRN [id number]  PCP:  [name], MD  Cardiologist:  [name], MD   Referring MD: [name], MD   No chief complaint on file.   History of Present Illness:    Nicholas Foley is a 68 y.o. male with a hx of paroxysmal atrial fibrillation, amiodarone therapy, obstructive sleep apnea, hypertension,chronic anticoagulation (Apixaban),chronic combined systolic and diastolic heart failure, morbid obesity, and left bundle branch block.  Past Medical History:  Diagnosis Date  . Atrial fibrillation with rapid ventricular response ([place]) 08/02/2013  . CHF (congestive heart failure) ([place])   . Chronic systolic dysfunction of left ventricle 08/02/2013   BNP greater than 2000   . COPD (chronic obstructive pulmonary disease) ([facility])    Moderate by PFTs with response to bronchodilators, May 2014  . Erectile dysfunction   . Hypertension   . Insomnia 08/02/13  . Left bundle branch block 08/02/2013  . Morbid obesity ([place]) 08/02/2013  . OSA (obstructive sleep apnea)    severe OSA with AHI 74/hr now on CPAP at 8cm H2O  . Presence of permanent cardiac pacemaker 12/14/2018    Past Surgical History:  Procedure Laterality Date  . BIV PACEMAKER INSERTION CRT-P N/A 12/14/2018   Procedure: BIV PACEMAKER INSERTION CRT-P;  Surgeon: Thompson Grayer, MD;  Location: [facility];  Service: Cardiovascular;  Laterality: N/A;  . CARDIOVERSION N/A 08/06/2013   Procedure: CARDIOVERSION;  Surgeon: Lelon Perla, MD;  Location: [facility] ENDOSCOPY;  Service: Cardiovascular;  Laterality: N/A;  spoke with [name]   . CARDIOVERSION N/A 09/03/2013   Procedure: CARDIOVERSION;  Surgeon: [name], MD;  Location: [facility] ENDOSCOPY;  Service: Cardiovascular;  Laterality: N/A;  . INSERT / REPLACE / REMOVE PACEMAKER  12/14/2018   BIV  . NASAL SEPTUM SURGERY    . RIGHT/LEFT HEART CATH AND CORONARY ANGIOGRAPHY N/A 12/04/2018   Procedure: RIGHT/LEFT HEART CATH AND CORONARY ANGIOGRAPHY;  Surgeon: Belva Crome, MD;  Location: [facility];  Service: Cardiovascular;  Laterality: N/A;  . TEE WITHOUT CARDIOVERSION N/A 08/06/2013   Procedure: TRANSESOPHAGEAL ECHOCARDIOGRAM (TEE);  Surgeon: Lelon Perla, MD;  [facility] ENDOSCOPY;  Service: Cardiovascular;  Laterality: N/A;  . VASECTOMY      Current Medications: No outpatient medications have been marked as taking for the 01/25/19 encounter (Appointment) with Belva Crome, MD.     Allergies:   Patient has no known allergies.   Social History   Socioeconomic History  . Marital status: Married    Spouse name: Not on file  . Number of children: Not on file  . Years of education: Not on file  . Highest education level: Not on file  Occupational History  . Occupation: real estate    Comment: investment  Social Needs  . Financial resource strain: Not on file  . Food insecurity:    Worry: Not on file    Inability: Not on file  . Transportation needs:    Medical: Not on file    Non-medical: Not on file  Tobacco Use  . Smoking status: Former Smoker    Packs/day: 1.00    Years: 30.00    Pack years: 30.00    Types: Cigarettes, Cigars    Last attempt to quit: 08/02/2012    Years since quitting: 6.4  . Smokeless tobacco: Never Used  Substance and Sexual Activity  . Alcohol use: Yes  Alcohol/week: 0.0 standard drinks    Comment: once a week; previously 3-4 times per week   . Drug use: No  . Sexual activity: Yes  Lifestyle  . Physical activity:    Days per week: Not on file    Minutes per session: Not on file  . Stress: Not on file  Relationships  . Social connections:    Talks on phone: Not on file    Gets together: Not on file    Attends religious service: Not on file    Active member of club or organization: Not on file    Attends meetings of clubs or organizations: Not on file    Relationship status: Not on file  Other Topics Concern    . Not on file  Social History Narrative   Works as a Engineer, structural for apartments   Lives with wife.  They have one grown daughter   Highest level of education:  college     Family History: The patient's family history includes Atrial fibrillation in his mother; COPD in his father; Cerebral aneurysm in his brother; Healthy in his daughter.  ROS:   Please see the history of present illness.    *** All other systems reviewed and are negative.  EKGs/Labs/Other Studies Reviewed:    The following studies were reviewed today: ***  EKG:  EKG ***  Recent Labs: 09/25/2018: TSH 1.500 11/27/2018: ALT 14; NT-Pro BNP 1,683 12/15/2018: BUN 18; Creatinine, Ser 1.24; Potassium 3.7; Sodium 136 01/04/2019: Hemoglobin 14.4; Platelets 222.0  Recent Lipid Panel    Component Value Date/Time   CHOL 184 08/03/2013 0640   TRIG 149 08/03/2013 0640   HDL 50 08/03/2013 0640   CHOLHDL 3.7 08/03/2013 0640   VLDL 30 08/03/2013 0640   LDLCALC 104 (H) 08/03/2013 0640    Physical Exam:    VS:  There were no vitals taken for this visit.    Wt Readings from Last 3 Encounters:  01/04/19 (!) 320 lb 3.2 oz (145.2 kg)  12/15/18 (!) 321 lb 3.4 oz (145.7 kg)  12/08/18 (!) 322 lb 6.4 oz (146.2 kg)     GEN: ***. No acute distress HEENT: Normal NECK: No JVD. LYMPHATICS: No lymphadenopathy CARDIAC: ***RRR.  *** murmur, ***gallop, ***edema VASCULAR: *** Pulses, *** bruits RESPIRATORY:  Clear to auscultation without rales, wheezing or rhonchi  ABDOMEN: Soft, non-tender, non-distended, No pulsatile mass, MUSCULOSKELETAL: No deformity  SKIN: Warm and dry NEUROLOGIC:  Alert and oriented x 3 PSYCHIATRIC:  Normal affect   ASSESSMENT:    1. Paroxysmal A-fib ([place])   2. Chronic combined systolic and diastolic heart failure ([place])   3. Left bundle branch block   4. On amiodarone therapy   5. Hypertension, essential   6. Chronic anticoagulation   7. OSA (obstructive sleep apnea)    PLAN:    In  order of problems listed above:  1. ***   Medication Adjustments/Labs and Tests Ordered: Current medicines are reviewed at length with the patient today.  Concerns regarding medicines are outlined above.  No orders of the defined types were placed in this encounter.  No orders of the defined types were placed in this encounter.   There are no Patient Instructions on file for this visit.   Signed, [name], MD  01/24/2019 8:22 PM    [facility]

## 2019-01-25 ENCOUNTER — Ambulatory Visit: Payer: Managed Care, Other (non HMO) | Admitting: Interventional Cardiology

## 2019-02-13 ENCOUNTER — Ambulatory Visit: Payer: Managed Care, Other (non HMO) | Admitting: Pulmonary Disease

## 2019-02-19 ENCOUNTER — Other Ambulatory Visit: Payer: Self-pay | Admitting: Nurse Practitioner

## 2019-02-20 ENCOUNTER — Ambulatory Visit: Payer: Managed Care, Other (non HMO) | Admitting: Cardiology

## 2019-03-07 ENCOUNTER — Telehealth: Payer: Self-pay | Admitting: Interventional Cardiology

## 2019-03-07 NOTE — Telephone Encounter (Signed)
Spoke with pt about appt scheduled with Dr. [name] on 4/22.  Pt agreeable to changing over to a virtual video visit.  Advised someone would contact him to get everything set up.  Pt verbalized understanding and was appreciative for call.

## 2019-03-13 DIAGNOSIS — Z950 Presence of cardiac pacemaker: Secondary | ICD-10-CM | POA: Insufficient documentation

## 2019-03-13 NOTE — Progress Notes (Signed)
Virtual Visit via Video Note   This visit type was conducted due to national recommendations for restrictions regarding the COVID-19 Pandemic (e.g. social distancing) in an effort to limit this patient's exposure and mitigate transmission in our community.  Due to his co-morbid illnesses, this patient is at least at moderate risk for complications without adequate follow up.  This format is felt to be most appropriate for this patient at this time.  All issues noted in this document were discussed and addressed.  A limited physical exam was performed with this format.  Please refer to the patient's chart for his consent to telehealth for [facility].   Evaluation Performed:  Follow-up visit  Date:  03/14/2019   ID:  Nicholas Foley, DOB 10/19/1951, MRN [id number]  Patient Location: Home Provider Location: Office  PCP:  [name], MD  Cardiologist:  [name], MD  Electrophysiologist:  None   Chief Complaint:  CHF  History of Present Illness:    Nicholas Foley is a 68 y.o. male with hx of paroxysmal atrial fibrillation, amiodarone therapy, obstructive sleep apnea, hypertension,chronic anticoagulation (Apixaban),chronic combined systolic and diastolic heart failure, morbid obesity, and left bundle branch block.Underwent CRT 12/252, uncomplicated.  We called the patient on Doximity.  He had difficulty connecting.  After connecting, we were able to ascertain that there is no swelling, breathing is improved, energy is markedly improved, and the recent resynchronization pacemaker insertion site shows no evidence of infection or difficulty with healing.  No medication side effects.  The patient does not have symptoms concerning for COVID-19 infection (fever, chills, cough, or new shortness of breath).    Past Medical History:  Diagnosis Date  . Atrial fibrillation with rapid ventricular response ([place]) 08/02/2013  . CHF (congestive heart failure) ([place])   . Chronic systolic  dysfunction of left ventricle 08/02/2013   BNP greater than 2000   . COPD (chronic obstructive pulmonary disease) (HCC)    Moderate by PFTs with response to bronchodilators, May 2014  . Erectile dysfunction   . Hypertension   . Insomnia 08/02/13  . Left bundle branch block 08/02/2013  . Morbid obesity ([place]) 08/02/2013  . OSA (obstructive sleep apnea)    severe OSA with AHI 74/hr now on CPAP at 8cm H2O  . Presence of permanent cardiac pacemaker 12/14/2018   Past Surgical History:  Procedure Laterality Date  . BIV PACEMAKER INSERTION CRT-P N/A 12/14/2018   Procedure: BIV PACEMAKER INSERTION CRT-P;  Surgeon: Thompson Grayer, MD;  Location: [facility];  Service: Cardiovascular;  Laterality: N/A;  . CARDIOVERSION N/A 08/06/2013   Procedure: CARDIOVERSION;  Surgeon: Lelon Perla, MD;  Location: [facility] ENDOSCOPY;  Service: Cardiovascular;  Laterality: N/A;  spoke with [name]   . CARDIOVERSION N/A 09/03/2013   Procedure: CARDIOVERSION;  Surgeon: [name], MD;  Location: [facility] ENDOSCOPY;  Service: Cardiovascular;  Laterality: N/A;  . INSERT / REPLACE / REMOVE PACEMAKER  12/14/2018   BIV  . NASAL SEPTUM SURGERY    . RIGHT/LEFT HEART CATH AND CORONARY ANGIOGRAPHY N/A 12/04/2018   Procedure: RIGHT/LEFT HEART CATH AND CORONARY ANGIOGRAPHY;  Surgeon: Belva Crome, MD;  Location: [facility];  Service: Cardiovascular;  Laterality: N/A;  . TEE WITHOUT CARDIOVERSION N/A 08/06/2013   Procedure: TRANSESOPHAGEAL ECHOCARDIOGRAM (TEE);  Surgeon: Lelon Perla, MD;  Location: [facility] ENDOSCOPY;  Service: Cardiovascular;  Laterality: N/A;  . VASECTOMY       Current Meds  Medication Sig  . acetaminophen (  TYLENOL) 650 MG CR tablet Take 1,300 mg by mouth every 8 (eight) hours as needed for pain.   [name] apixaban (ELIQUIS) 5 MG TABS tablet Take 1 tablet (5 mg total) by mouth 2 (two) times daily.  . carvedilol (COREG) 6.25 MG tablet TAKE 1 TABLET(6.25 MG) BY MOUTH TWICE DAILY  . cholecalciferol  (VITAMIN D3) 25 MCG (1000 UT) tablet Take 1,000 Units by mouth daily.  [name] CIALIS 20 MG tablet Take 20 mg by mouth daily as needed for erectile dysfunction.   . fluticasone (FLONASE) 50 MCG/ACT nasal spray Place 1 spray into both nostrils 2 (two) times daily.   . Fluticasone-Salmeterol (ADVAIR) 250-50 MCG/DOSE AEPB Inhale 1 puff into the lungs 2 (two) times daily.   . furosemide (LASIX) 40 MG tablet Take 80 mg by mouth 2 (two) times daily.  . Multiple Vitamins-Minerals (PRESERVISION AREDS 2 PO) Take 1 tablet by mouth 2 (two) times daily.   [name] omeprazole (PRILOSEC OTC) 20 MG tablet Take 20 mg by mouth daily.  . potassium chloride SA (K-DUR) 20 MEQ tablet Take 20 mEq by mouth 2 (two) times daily.  . sacubitril-valsartan (ENTRESTO) 49-51 MG Take 1 tablet by mouth 2 (two) times daily.  . vitamin B-12 (CYANOCOBALAMIN) 1000 MCG tablet Take 1,000 mcg by mouth daily.  [name] zolpidem (AMBIEN CR) 12.5 MG CR tablet Take 1 tablet by mouth at bedtime as needed for sleep.      Allergies:   Patient has no known allergies.   Social History   Tobacco Use  . Smoking status: Former Smoker    Packs/day: 1.00    Years: 30.00    Pack years: 30.00    Types: Cigarettes, Cigars    Last attempt to quit: 08/02/2012    Years since quitting: 6.6  . Smokeless tobacco: Never Used  Substance Use Topics  . Alcohol use: Yes    Alcohol/week: 0.0 standard drinks    Comment: once a week; previously 3-4 times per week   . Drug use: No     Family Hx: The patient's family history includes Atrial fibrillation in his mother; COPD in his father; Cerebral aneurysm in his brother; Healthy in his daughter.  ROS:   Please see the history of present illness.    He has been sleeping better.  Appetite is stable. All other systems reviewed and are negative.   Prior CV studies:   The following studies were reviewed today:  No new functional or imaging data.  Labs/Other Tests and Data Reviewed:    EKG:  No ECG reviewed.   Recent Labs: 09/25/2018: TSH 1.500 11/27/2018: ALT 14; NT-Pro BNP 1,683 12/15/2018: BUN 18; Creatinine, Ser 1.24; Potassium 3.7; Sodium 136 01/04/2019: Hemoglobin 14.4; Platelets 222.0   Recent Lipid Panel Lab Results  Component Value Date/Time   CHOL 184 08/03/2013 06:40 AM   TRIG 149 08/03/2013 06:40 AM   HDL 50 08/03/2013 06:40 AM   CHOLHDL 3.7 08/03/2013 06:40 AM   LDLCALC 104 (H) 08/03/2013 06:40 AM    Wt Readings from Last 3 Encounters:  01/04/19 (!) 320 lb 3.2 oz (145.2 kg)  12/15/18 (!) 321 lb 3.4 oz (145.7 kg)  12/08/18 (!) 322 lb 6.4 oz (146.2 kg)     Objective:    Vital Signs:  BP (!) 161/93   Pulse 79   Ht 5\' 11"  (1.803 m)   SpO2 95%   BMI 44.66 kg/m    VITAL SIGNS:  reviewed No evidence of cyanosis.  No ankle edema.  Breathing  comfortably.  No neck vein distention.  ASSESSMENT & PLAN:    1. Chronic combined systolic and diastolic heart failure (HCC)   2. Paroxysmal A-fib ([place])   3. OSA (obstructive sleep apnea)   4. Left bundle branch block   5. Chronic anticoagulation   6. On amiodarone therapy   7. Cardiac resynchronization therapy pacemaker (CRT-P) in place    PLAN:  1. Comprehensive metabolic panel and BNP will be performed in mid May.  We discussed moderate aerobic activity, salt restriction to less than 2-1/2 g of sodium per day, and volume restriction to less than 2 L/day.  He should call if increasing shortness of breath, lower extremity swelling, or orthopnea. 2. No clinical indicators and his digital iWatches not signaled rapid heart rate to suggest atrial fibrillation.  He is not currently on antiarrhythmic therapy. 3. Continue CPAP 4. Status post CRT-P with normal function at last evaluation. 5. The patient is no longer taking amiodarone. 6. In review of recent data,  COVID-19 Education: The signs and symptoms of COVID-19 were discussed with the patient and how to seek care for testing (follow up with PCP or arrange E-visit).  The importance  of social distancing was discussed today.  Time:   Today, I have spent 15 minutes with the patient with telehealth technology discussing the above problems.     Medication Adjustments/Labs and Tests Ordered: Current medicines are reviewed at length with the patient today.  Concerns regarding medicines are outlined above.   Tests Ordered: No orders of the defined types were placed in this encounter.   Medication Changes: No orders of the defined types were placed in this encounter.   Disposition:  Follow up in 4 month(s)  Signed, [name], MD  03/14/2019 9:54 AM    [place]

## 2019-03-14 ENCOUNTER — Telehealth (INDEPENDENT_AMBULATORY_CARE_PROVIDER_SITE_OTHER): Payer: Managed Care, Other (non HMO) | Admitting: Interventional Cardiology

## 2019-03-14 ENCOUNTER — Other Ambulatory Visit: Payer: Self-pay

## 2019-03-14 ENCOUNTER — Encounter

## 2019-03-14 ENCOUNTER — Encounter: Payer: Self-pay | Admitting: Interventional Cardiology

## 2019-03-14 VITALS — BP 161/93 | HR 79 | Ht 71.0 in

## 2019-03-14 DIAGNOSIS — G4733 Obstructive sleep apnea (adult) (pediatric): Secondary | ICD-10-CM

## 2019-03-14 DIAGNOSIS — I447 Left bundle-branch block, unspecified: Secondary | ICD-10-CM

## 2019-03-14 DIAGNOSIS — I5042 Chronic combined systolic (congestive) and diastolic (congestive) heart failure: Secondary | ICD-10-CM

## 2019-03-14 DIAGNOSIS — Z950 Presence of cardiac pacemaker: Secondary | ICD-10-CM

## 2019-03-14 DIAGNOSIS — Z7901 Long term (current) use of anticoagulants: Secondary | ICD-10-CM

## 2019-03-14 DIAGNOSIS — I480 Paroxysmal atrial fibrillation: Secondary | ICD-10-CM

## 2019-03-14 DIAGNOSIS — Z79899 Other long term (current) drug therapy: Secondary | ICD-10-CM

## 2019-03-14 NOTE — Addendum Note (Signed)
Addended by: Loren Racer on: 03/14/2019 10:15 AM   Modules accepted: Orders

## 2019-03-14 NOTE — Patient Instructions (Signed)
Medication Instructions:  Your physician recommends that you continue on your current medications as directed. Please refer to the Current Medication list given to you today.  If you need a refill on your cardiac medications before your next appointment, please call your pharmacy.   Lab work: Your physician recommends that you return for lab work in: late May (BMET, Liver, and Pro BNP).  If you have labs (blood work) drawn today and your tests are completely normal, you will receive your results only by: [name] MyChart Message (if you have MyChart) OR . A paper copy in the mail If you have any lab test that is abnormal or we need to change your treatment, we will call you to review the results.  Testing/Procedures: None  Follow-Up: At [facility], you and your health needs are our priority.  As part of our continuing mission to provide you with exceptional heart care, we have created designated Provider Care Teams.  These Care Teams include your primary Cardiologist (physician) and Advanced Practice Providers (APPs -  Physician Assistants and Nurse Practitioners) who all work together to provide you with the care you need, when you need it. You will need a follow up appointment in 4 months.  Please call our office 2 months in advance to schedule this appointment.  You may see [name], MD or one of the following Advanced Practice Providers on your designated Care Team:   [name], NP [name], NP . [name], NP  Any Other Special Instructions Will Be Listed Below (If Applicable).

## 2019-03-15 ENCOUNTER — Other Ambulatory Visit: Payer: Self-pay

## 2019-03-15 ENCOUNTER — Telehealth: Payer: Self-pay

## 2019-03-15 ENCOUNTER — Ambulatory Visit (INDEPENDENT_AMBULATORY_CARE_PROVIDER_SITE_OTHER): Payer: Managed Care, Other (non HMO) | Admitting: *Deleted

## 2019-03-15 DIAGNOSIS — I5042 Chronic combined systolic (congestive) and diastolic (congestive) heart failure: Secondary | ICD-10-CM

## 2019-03-15 LAB — CUP PACEART REMOTE DEVICE CHECK
Battery Remaining Longevity: 164 mo
Battery Voltage: 3.18 V
Brady Statistic AP VP Percent: 0.25 %
Brady Statistic AP VS Percent: 0.02 %
Brady Statistic AS VP Percent: 97.39 %
Brady Statistic AS VS Percent: 2.34 %
Brady Statistic RA Percent Paced: 0.29 %
Brady Statistic RV Percent Paced: 3.84 %
Date Time Interrogation Session: 20200422235639
Implantable Lead Implant Date: 20200123
Implantable Lead Implant Date: 20200123
Implantable Lead Implant Date: 20200123
Implantable Lead Location: 753858
Implantable Lead Location: 753859
Implantable Lead Location: 753860
Implantable Lead Model: 4598
Implantable Lead Model: 5076
Implantable Lead Model: 5076
Implantable Pulse Generator Implant Date: 20200123
Lead Channel Impedance Value: 1007 Ohm
Lead Channel Impedance Value: 1083 Ohm
Lead Channel Impedance Value: 1197 Ohm
Lead Channel Impedance Value: 1216 Ohm
Lead Channel Impedance Value: 1292 Ohm
Lead Channel Impedance Value: 323 Ohm
Lead Channel Impedance Value: 418 Ohm
Lead Channel Impedance Value: 418 Ohm
Lead Channel Impedance Value: 513 Ohm
Lead Channel Impedance Value: 608 Ohm
Lead Channel Impedance Value: 608 Ohm
Lead Channel Impedance Value: 722 Ohm
Lead Channel Impedance Value: 798 Ohm
Lead Channel Impedance Value: 931 Ohm
Lead Channel Pacing Threshold Amplitude: 0.625 V
Lead Channel Pacing Threshold Amplitude: 0.625 V
Lead Channel Pacing Threshold Amplitude: 1 V
Lead Channel Pacing Threshold Pulse Width: 0.4 ms
Lead Channel Pacing Threshold Pulse Width: 0.4 ms
Lead Channel Pacing Threshold Pulse Width: 0.5 ms
Lead Channel Sensing Intrinsic Amplitude: 1 mV
Lead Channel Sensing Intrinsic Amplitude: 7.625 mV
Lead Channel Setting Pacing Amplitude: 1.25 V
Lead Channel Setting Pacing Amplitude: 3.5 V
Lead Channel Setting Pacing Amplitude: 3.5 V
Lead Channel Setting Pacing Pulse Width: 0.4 ms
Lead Channel Setting Pacing Pulse Width: 0.5 ms
Lead Channel Setting Sensing Sensitivity: 2 mV

## 2019-03-15 NOTE — Telephone Encounter (Signed)
Left message regarding appt on 03/19/19. 

## 2019-03-19 ENCOUNTER — Encounter: Payer: Self-pay | Admitting: Internal Medicine

## 2019-03-19 ENCOUNTER — Telehealth: Payer: Self-pay

## 2019-03-19 ENCOUNTER — Other Ambulatory Visit: Payer: Self-pay

## 2019-03-19 ENCOUNTER — Telehealth (INDEPENDENT_AMBULATORY_CARE_PROVIDER_SITE_OTHER): Payer: Managed Care, Other (non HMO) | Admitting: Internal Medicine

## 2019-03-19 DIAGNOSIS — I5042 Chronic combined systolic (congestive) and diastolic (congestive) heart failure: Secondary | ICD-10-CM | POA: Diagnosis not present

## 2019-03-19 DIAGNOSIS — G4733 Obstructive sleep apnea (adult) (pediatric): Secondary | ICD-10-CM | POA: Diagnosis not present

## 2019-03-19 DIAGNOSIS — I447 Left bundle-branch block, unspecified: Secondary | ICD-10-CM

## 2019-03-19 NOTE — Progress Notes (Signed)
Electrophysiology TeleHealth Note   Due to national recommendations of social distancing due to COVID 19, an audio/video telehealth visit is felt to be most appropriate for this patient at this time.  See MyChart message from today for the patient's consent to telehealth for [facility].  Facetime used for this visit.   Date:  03/19/2019   ID:  Nicholas Foley, DOB 1951/05/04, MRN [id number]  Location: patient's home  Provider location: [address], [place]  Evaluation Performed: Follow-up visit  PCP:  [name], MD  Cardiologist:  [name], MD  Electrophysiologist:  Dr [name]  Chief Complaint:  CHF  History of Present Illness:    Nicholas Foley is a 68 y.o. male who presents via audio/video conferencing for a telehealth visit today.  Since his CRT-P device was implanted, the patient reports doing very well.  He is very happy with results.  Device pocket has healed nicely.  Exercise tolerance is improved.  Today, he denies symptoms of palpitations, chest pain, shortness of breath,  lower extremity edema, dizziness, presyncope, or syncope.  The patient is otherwise without complaint today.  The patient denies symptoms of fevers, chills, cough, or new SOB worrisome for COVID 19.  Past Medical History:  Diagnosis Date  . Atrial fibrillation with rapid ventricular response ([place]) 08/02/2013  . CHF (congestive heart failure) ([place])   . Chronic systolic dysfunction of left ventricle 08/02/2013   BNP greater than 2000   . COPD (chronic obstructive pulmonary disease) (HCC)    Moderate by PFTs with response to bronchodilators, May 2014  . Erectile dysfunction   . Hypertension   . Insomnia 08/02/13  . Left bundle branch block 08/02/2013  . Morbid obesity ([place]) 08/02/2013  . OSA (obstructive sleep apnea)    severe OSA with AHI 74/hr now on CPAP at 8cm H2O    Past Surgical History:  Procedure Laterality Date  . BIV PACEMAKER INSERTION CRT-P N/A 12/14/2018   Medtronic model K9TO67 Percepta Quad CRT-P MRI Rolan Bucco (serial Number [id number]) device implanted by Dr [name] for CHF and LBBB  . CARDIOVERSION N/A 08/06/2013   Procedure: CARDIOVERSION;  Surgeon: Lelon Perla, MD;  Location: [facility] ENDOSCOPY;  Service: Cardiovascular;  Laterality: N/A;  spoke with [name]   . CARDIOVERSION N/A 09/03/2013   Procedure: CARDIOVERSION;  Surgeon: [name], MD;  Location: [facility] ENDOSCOPY;  Service: Cardiovascular;  Laterality: N/A;  . chronic systolic dysfu    . NASAL SEPTUM SURGERY    . RIGHT/LEFT HEART CATH AND CORONARY ANGIOGRAPHY N/A 12/04/2018   Procedure: RIGHT/LEFT HEART CATH AND CORONARY ANGIOGRAPHY;  Surgeon: Belva Crome, MD;  Location: [facility];  Service: Cardiovascular;  Laterality: N/A;  . TEE WITHOUT CARDIOVERSION N/A 08/06/2013   Procedure: TRANSESOPHAGEAL ECHOCARDIOGRAM (TEE);  Surgeon: Lelon Perla, MD;  Location: [facility] ENDOSCOPY;  Service: Cardiovascular;  Laterality: N/A;  . VASECTOMY      Current Outpatient Medications  Medication Sig Dispense Refill  . acetaminophen (TYLENOL) 650 MG CR tablet Take 1,300 mg by mouth every 8 (eight) hours as needed for pain.     [name] apixaban (ELIQUIS) 5 MG TABS tablet Take 1 tablet (5 mg total) by mouth 2 (two) times daily. 180 tablet 3  . carvedilol (COREG) 6.25 MG tablet TAKE 1 TABLET(6.25 MG) BY MOUTH TWICE DAILY 60 tablet 3  . cholecalciferol (VITAMIN D3) 25 MCG (1000 UT) tablet Take 1,000 Units by mouth daily.    [name] CIALIS  20 MG tablet Take 20 mg by mouth daily as needed for erectile dysfunction.   0  . fluticasone (FLONASE) 50 MCG/ACT nasal spray Place 1 spray into both nostrils 2 (two) times daily.   0  . Fluticasone-Salmeterol (ADVAIR) 250-50 MCG/DOSE AEPB Inhale 1 puff into the lungs 2 (two) times daily.     . furosemide (LASIX) 40 MG tablet Take 80 mg by mouth 2 (two) times daily.    . Multiple Vitamins-Minerals (PRESERVISION AREDS 2 PO) Take 1 tablet by mouth 2 (two) times daily.      [name] omeprazole (PRILOSEC OTC) 20 MG tablet Take 20 mg by mouth daily.    . potassium chloride SA (K-DUR) 20 MEQ tablet Take 20 mEq by mouth 2 (two) times daily.    . sacubitril-valsartan (ENTRESTO) 49-51 MG Take 1 tablet by mouth 2 (two) times daily. 60 tablet 11  . vitamin B-12 (CYANOCOBALAMIN) 1000 MCG tablet Take 1,000 mcg by mouth daily.    [name] zolpidem (AMBIEN CR) 12.5 MG CR tablet Take 1 tablet by mouth at bedtime as needed for sleep.      No current facility-administered medications for this visit.     Allergies:   Patient has no known allergies.   Social History:  The patient  reports that he quit smoking about 6 years ago. His smoking use included cigarettes and cigars. He has a 30.00 pack-year smoking history. He has never used smokeless tobacco. He reports current alcohol use. He reports that he does not use drugs.   Family History:  The patient's  family history includes Atrial fibrillation in his mother; COPD in his father; Cerebral aneurysm in his brother; Healthy in his daughter.   ROS:  Please see the history of present illness.   All other systems are personally reviewed and negative.    Exam:    Vital Signs:  There were no vitals taken for this visit.  Well appearing, alert and conversant, regular work of breathing,  good skin color Eyes- anicteric, neuro- grossly intact, skin- no apparent rash or lesions or cyanosis, mouth- oral mucosa is pink   Labs/Other Tests and Data Reviewed:    Recent Labs: 09/25/2018: TSH 1.500 11/27/2018: ALT 14; NT-Pro BNP 1,683 12/15/2018: BUN 18; Creatinine, Ser 1.24; Potassium 3.7; Sodium 136 01/04/2019: Hemoglobin 14.4; Platelets 222.0   Wt Readings from Last 3 Encounters:  01/04/19 (!) 320 lb 3.2 oz (145.2 kg)  12/15/18 (!) 321 lb 3.4 oz (145.7 kg)  12/08/18 (!) 322 lb 6.4 oz (146.2 kg)     Other studies personally reviewed: Additional studies/ records that were reviewed today include: my implant note,  Dr Darliss Ridgel recent notes  Review  of the above records today demonstrates: as above Prior radiographs: stable leads, no ptx   Last device remote is reviewed from [facility] PDF dated 03/14/2019 which reveals normal device function, no arrhythmias,  No afib   ASSESSMENT & PLAN:    1.  Chronic systolic dysfunction/LBBB Normal CRT-P device function by recent remotes.  Will need to bring back into the office after COVID 19 for nurse visit to reduce outputs as per usual practice.  I have sent a staff message to EP nurse Miliano.  Consider repeating echo on follow-up with Dr [name] I will enroll in device ICM clinic with Sharman Cheek.  2. afib Well controlled No changes Monitor through device going forward  3. Obesity Lifestyle modification is encouraged  4. OSA Follows with Dr Radford Pax  5. COVID 19 screen The patient  denies symptoms of COVID 19 at this time.  The importance of social distancing was discussed today.  Follow-up:  12 months with me Next remote: 05/2019, enroll in ICM as above  Current medicines are reviewed at length with the patient today.   The patient does not have concerns regarding his medicines.  The following changes were made today:  none  Labs/ tests ordered today include:  No orders of the defined types were placed in this encounter.   Patient Risk:  after full review of this patients clinical status, I feel that they are at moderate risk at this time.  Today, I have spent 15 minutes with the patient with telehealth technology discussing CHF and afib .    SignedThompson Grayer, MD  03/19/2019 2:28 PM     [place] [address] [phone] (office) [phone] (fax)

## 2019-03-19 NOTE — Telephone Encounter (Signed)
Spoke with pt regarding appt on 03/19/19. Pt stated he will check vitals prior to appt. Pt questions and concerns were address.

## 2019-03-19 NOTE — Telephone Encounter (Signed)
New Message   Patient returning your phone call please give him a call back.

## 2019-03-20 ENCOUNTER — Telehealth: Payer: Self-pay | Admitting: Cardiology

## 2019-03-20 NOTE — Telephone Encounter (Signed)
Unable to reach to set up [email or web address] 03/20/19

## 2019-03-21 ENCOUNTER — Telehealth: Payer: Self-pay | Admitting: Cardiology

## 2019-03-21 NOTE — Progress Notes (Signed)
Virtual Visit via - He has a cell phone but due to it being connected to his office computers he cannot do video.   Telephone Note   This visit type was conducted due to national recommendations for restrictions regarding the COVID-19 Pandemic (e.g. social distancing) in an effort to limit this patient's exposure and mitigate transmission in our community.  Due to his co-morbid illnesses, this patient is at least at moderate risk for complications without adequate follow up.  This format is felt to be most appropriate for this patient at this time.  All issues noted in this document were discussed and addressed.  A limited physical exam was performed with this format.  Please refer to the patient's chart for his consent to telehealth for [facility].   Evaluation Performed:  Follow-up visit  This visit type was conducted due to national recommendations for restrictions regarding the COVID-19 Pandemic (e.g. social distancing).  This format is felt to be most appropriate for this patient at this time.  All issues noted in this document were discussed and addressed.  No physical exam was performed (except for noted visual exam findings with Video Visits).  Please refer to the patient's chart (MyChart message for video visits and phone note for telephone visits) for the patient's consent to telehealth for [facility].  Date:  03/22/2019   ID:  KNOWLEDGE ESCANDON, DOB Jun 14, 1951, MRN [id number]  Patient Location:  [facility]  Provider location:   [place]  PCP:  [name], MD  Cardiologist:  [name], MD  Sleep Medicine: Fransico Him, MD Electrophysiologist:  None   Chief Complaint:  OSA, HTN  History of Present Illness:    RAEQWON LUX is a 67 y.o. male who presents via audio/video conferencing for a telehealth visit today.    JAWON DIPIERO is a 68 y.o. male with a hx of severe obstructive sleep apnea with an AHI of 74/h on PSG now on CPAP at 8 cm H2O.He is doing well with his  CPAP device.  He is doing well with his CPAP device and thinks that he has gotten used to it.  He tolerates the mask and feels the pressure is adequate.  Since going on CPAP he feels rested in the am and has no significant daytime sleepiness.  He denies any significant mouth or nasal dryness or nasal congestion.  He does not think that he snores.    The patient does not have symptoms concerning for COVID-19 infection (fever, chills, cough, or new shortness of breath).    Prior CV studies:   The following studies were reviewed today:  PAP compliance download  Past Medical History:  Diagnosis Date  . Atrial fibrillation with rapid ventricular response ([place]) 08/02/2013  . CHF (congestive heart failure) ([place])   . Chronic systolic dysfunction of left ventricle 08/02/2013   BNP greater than 2000   . COPD (chronic obstructive pulmonary disease) ([facility])    Moderate by PFTs with response to bronchodilators, May 2014  . Erectile dysfunction   . Hypertension   . Insomnia 08/02/13  . Left bundle branch block 08/02/2013  . Morbid obesity ([place]) 08/02/2013  . OSA (obstructive sleep apnea)    severe OSA with AHI 74/hr now on CPAP at 8cm H2O   Past Surgical History:  Procedure Laterality Date  . BIV PACEMAKER INSERTION CRT-P N/A 12/14/2018   Medtronic model [id number] Percepta Quad CRT-P MRI Rolan Bucco (serial Number [id number]) device implanted by Dr [name] for CHF and  LBBB  . CARDIOVERSION N/A 08/06/2013   Procedure: CARDIOVERSION;  Surgeon: Lelon Perla, MD;  Location: [facility] ENDOSCOPY;  Service: Cardiovascular;  Laterality: N/A;  spoke with [name]   . CARDIOVERSION N/A 09/03/2013   Procedure: CARDIOVERSION;  Surgeon: [name], MD;  Location: [facility] ENDOSCOPY;  Service: Cardiovascular;  Laterality: N/A;  . chronic systolic dysfu    . NASAL SEPTUM SURGERY    . RIGHT/LEFT HEART CATH AND CORONARY ANGIOGRAPHY N/A 12/04/2018   Procedure: RIGHT/LEFT HEART CATH AND CORONARY ANGIOGRAPHY;  Surgeon: Belva Crome, MD;  Location: [facility] CV LAB;  Service: Cardiovascular;  Laterality: N/A;  . TEE WITHOUT CARDIOVERSION N/A 08/06/2013   Procedure: TRANSESOPHAGEAL ECHOCARDIOGRAM (TEE);  Surgeon: Lelon Perla, MD;  Location: [facility] ENDOSCOPY;  Service: Cardiovascular;  Laterality: N/A;  . VASECTOMY       Current Meds  Medication Sig  . acetaminophen (TYLENOL) 650 MG CR tablet Take 1,300 mg by mouth every 8 (eight) hours as needed for pain.   [name] apixaban (ELIQUIS) 5 MG TABS tablet Take 1 tablet (5 mg total) by mouth 2 (two) times daily.  . carvedilol (COREG) 6.25 MG tablet TAKE 1 TABLET(6.25 MG) BY MOUTH TWICE DAILY  . cholecalciferol (VITAMIN D3) 25 MCG (1000 UT) tablet Take 1,000 Units by mouth daily.  [name] CIALIS 20 MG tablet Take 20 mg by mouth daily as needed for erectile dysfunction.   . fluticasone (FLONASE) 50 MCG/ACT nasal spray Place 1 spray into both nostrils 2 (two) times daily.   . Fluticasone-Salmeterol (ADVAIR) 250-50 MCG/DOSE AEPB Inhale 1 puff into the lungs 2 (two) times daily.   . furosemide (LASIX) 40 MG tablet Take 80 mg by mouth 2 (two) times daily.  . Multiple Vitamins-Minerals (PRESERVISION AREDS 2 PO) Take 1 tablet by mouth 2 (two) times daily.   [name] omeprazole (PRILOSEC OTC) 20 MG tablet Take 20 mg by mouth daily.  . potassium chloride SA (K-DUR) 20 MEQ tablet Take 20 mEq by mouth 2 (two) times daily.  . sacubitril-valsartan (ENTRESTO) 49-51 MG Take 1 tablet by mouth 2 (two) times daily.  . vitamin B-12 (CYANOCOBALAMIN) 1000 MCG tablet Take 1,000 mcg by mouth daily.  [name] zolpidem (AMBIEN CR) 12.5 MG CR tablet Take 1 tablet by mouth at bedtime as needed for sleep.      Allergies:   Patient has no known allergies.   Social History   Tobacco Use  . Smoking status: Former Smoker    Packs/day: 1.00    Years: 30.00    Pack years: 30.00    Types: Cigarettes, Cigars    Last attempt to quit: 08/02/2012    Years since quitting: 6.6  . Smokeless tobacco: Never Used  Substance Use  Topics  . Alcohol use: Yes    Alcohol/week: 0.0 standard drinks    Comment: once a week; previously 3-4 times per week   . Drug use: No     Family Hx: The patient's family history includes Atrial fibrillation in his mother; COPD in his father; Cerebral aneurysm in his brother; Healthy in his daughter.  ROS:   Please see the history of present illness.     All other systems reviewed and are negative.   Labs/Other Tests and Data Reviewed:    Recent Labs: 09/25/2018: TSH 1.500 11/27/2018: ALT 14; NT-Pro BNP 1,683 12/15/2018: BUN 18; Creatinine, Ser 1.24; Potassium 3.7; Sodium 136 01/04/2019: Hemoglobin 14.4; Platelets 222.0   Recent Lipid Panel Lab Results  Component Value Date/Time  CHOL 184 08/03/2013 06:40 AM   TRIG 149 08/03/2013 06:40 AM   HDL 50 08/03/2013 06:40 AM   CHOLHDL 3.7 08/03/2013 06:40 AM   LDLCALC 104 (H) 08/03/2013 06:40 AM    Wt Readings from Last 3 Encounters:  01/04/19 (!) 320 lb 3.2 oz (145.2 kg)  12/15/18 (!) 321 lb 3.4 oz (145.7 kg)  12/08/18 (!) 322 lb 6.4 oz (146.2 kg)     Objective:    Vital Signs:  Ht 5\' 11"  (1.803 m)   BMI 44.66 kg/m    No PE exam done due to telephone call.  He has a cell phone but due to it being connected to his office computers he cannot do video.    ASSESSMENT & PLAN:    1.  OSA - the patient is tolerating PAP therapy well without any problems. The PAP download was reviewed today and showed an AHI of 2/hr on auto PAP 100% compliance in using more than 4 hours nightly.  The patient has been using and benefiting from PAP use and will continue to benefit from therapy.   2.  Hypertension - He checks his BP at home and is controlled.  He will continue on carvedilol 6.25mg  BID and Entresto 49-51mg  BID.   3.  Morbid Obesity - I have encouraged him to get into a routine exercise program and cut back on carbs and portions.   4.  COVID-19 Education:The signs and symptoms of COVID-19 were discussed with the patient and how to  seek care for testing (follow up with PCP or arrange E-visit).  The importance of social distancing was discussed today.  Patient Risk:   After full review of this patient's clinical status, I feel that they are at least moderate risk at this time.  Time:   Today, I have spent 15 minutes directly with the patient on telephone discussing medical problems including OSA, HTN and obesity .  We also reviewed the symptoms of COVID 19 and the ways to protect against contracting the virus with telehealth technology.  I spent an additional 5 minutes reviewing patient's chart including PAP compliance report.  Medication Adjustments/Labs and Tests Ordered: Current medicines are reviewed at length with the patient today.  Concerns regarding medicines are outlined above.  Tests Ordered: No orders of the defined types were placed in this encounter.  Medication Changes: No orders of the defined types were placed in this encounter.   Disposition:  Follow up in 1 year(s)  Signed, Fransico Him, MD  03/22/2019 8:42 AM    [facility]

## 2019-03-21 NOTE — Telephone Encounter (Signed)
Telephone only/verbal consent  03/21/19

## 2019-03-22 ENCOUNTER — Encounter: Payer: Self-pay | Admitting: Cardiology

## 2019-03-22 ENCOUNTER — Telehealth (INDEPENDENT_AMBULATORY_CARE_PROVIDER_SITE_OTHER): Payer: Managed Care, Other (non HMO) | Admitting: Cardiology

## 2019-03-22 ENCOUNTER — Other Ambulatory Visit: Payer: Self-pay

## 2019-03-22 VITALS — Ht 71.0 in

## 2019-03-22 DIAGNOSIS — Z7189 Other specified counseling: Secondary | ICD-10-CM | POA: Diagnosis not present

## 2019-03-22 DIAGNOSIS — I10 Essential (primary) hypertension: Secondary | ICD-10-CM | POA: Diagnosis not present

## 2019-03-22 DIAGNOSIS — G4733 Obstructive sleep apnea (adult) (pediatric): Secondary | ICD-10-CM | POA: Diagnosis not present

## 2019-03-22 NOTE — Patient Instructions (Signed)

## 2019-03-22 NOTE — Telephone Encounter (Signed)

## 2019-03-23 NOTE — Progress Notes (Signed)
Remote pacemaker transmission.   

## 2019-04-11 ENCOUNTER — Telehealth: Payer: Self-pay | Admitting: Student

## 2019-04-11 NOTE — Telephone Encounter (Signed)
-----   Message from Rosalene Billings, RN sent at 04/10/2019  3:59 PM EDT -----  ----- Message ----- From: Thompson Grayer, MD Sent: 03/19/2019   2:28 PM EDT To: Rosalene Billings, RN  Please enroll in ICM device clinic.  He expects your call. Thanks!

## 2019-04-11 NOTE — Telephone Encounter (Signed)
Initial ICM call made. Pt agrees to enrollment. Will forward to Sharman Cheek, RN for scheduling. Thank you!   Legrand Como [address] [place], PA-C 04/11/2019 10:59 AM

## 2019-04-13 ENCOUNTER — Other Ambulatory Visit: Payer: Managed Care, Other (non HMO)

## 2019-05-10 ENCOUNTER — Other Ambulatory Visit: Payer: Self-pay | Admitting: Pulmonary Disease

## 2019-05-16 ENCOUNTER — Other Ambulatory Visit (HOSPITAL_COMMUNITY): Payer: Managed Care, Other (non HMO)

## 2019-05-16 ENCOUNTER — Other Ambulatory Visit (HOSPITAL_COMMUNITY)
Admission: RE | Admit: 2019-05-16 | Discharge: 2019-05-16 | Disposition: A | Discharge status: Home / Self Care | Payer: Managed Care, Other (non HMO) | Source: Ambulatory Visit | Attending: Pulmonary Disease | Admitting: Pulmonary Disease

## 2019-05-16 ENCOUNTER — Telehealth: Payer: Self-pay

## 2019-05-16 ENCOUNTER — Ambulatory Visit (INDEPENDENT_AMBULATORY_CARE_PROVIDER_SITE_OTHER): Payer: Managed Care, Other (non HMO)

## 2019-05-16 DIAGNOSIS — I5042 Chronic combined systolic (congestive) and diastolic (congestive) heart failure: Secondary | ICD-10-CM | POA: Diagnosis not present

## 2019-05-16 DIAGNOSIS — Z1159 Encounter for screening for other viral diseases: Secondary | ICD-10-CM | POA: Insufficient documentation

## 2019-05-16 DIAGNOSIS — Z950 Presence of cardiac pacemaker: Secondary | ICD-10-CM

## 2019-05-16 LAB — SARS CORONAVIRUS 2 (TAT 6-24 HRS): SARS Coronavirus 2: NEGATIVE

## 2019-05-16 NOTE — Progress Notes (Signed)
EPIC Encounter for ICM Monitoring  Patient Name: Nicholas Foley is a 68 y.o. male Date: 05/16/2019 Primary Care Physican: [name], MD Primary Cardiologist: [name] Electrophysiologist: Allred Bi-V Pacing:   97.4%  Last Weight:  320 lbs       1st ICM remote transmission.  Spoke with patient.  Transmission reviewed.  He said he travels a lot and does not always take the fluid pill because he may not be close to a bathroom.  He is feeling fine and he has a better quality of life since defibrillator has been implanted.  Discussed limiting salt intake.    Carelink thoracic impedance normal.   Prescribed: Furosemide 40 mg take 80 mg twice a day.  Potassium 20 mEq take 1 tablet twice a day.   Recommendations: Advised to limit salt intake and encouraged to call if he experiences any fluid symptoms.    Follow-up plan: ICM clinic phone appointment on 06/18/2019.    Copy of ICM check sent to Dr. [name].   3 month ICM trend: 05/15/2019    1 Year ICM trend:       Rosalene Billings, RN 05/16/2019 4:23 PM

## 2019-05-16 NOTE — Telephone Encounter (Signed)
Remote ICM transmission received.  Attempted call to patient regarding ICM remote transmission and left message, per DPR, to return call.    

## 2019-05-18 ENCOUNTER — Ambulatory Visit (INDEPENDENT_AMBULATORY_CARE_PROVIDER_SITE_OTHER): Payer: Managed Care, Other (non HMO) | Admitting: Pulmonary Disease

## 2019-05-18 ENCOUNTER — Other Ambulatory Visit: Payer: Self-pay

## 2019-05-18 DIAGNOSIS — J449 Chronic obstructive pulmonary disease, unspecified: Secondary | ICD-10-CM

## 2019-05-18 LAB — PULMONARY FUNCTION TEST
DL/VA % pred: 100 %
DL/VA: 4.15 ml/min/mmHg/L
DLCO unc % pred: 85 %
DLCO unc: 23.73 ml/min/mmHg
FEF 25-75 Post: 1.51 L/sec
FEF 25-75 Pre: 1.44 L/sec
FEF2575-%Change-Post: 4 %
FEF2575-%Pred-Post: 53 %
FEF2575-%Pred-Pre: 51 %
FEV1-%Change-Post: 2 %
FEV1-%Pred-Post: 65 %
FEV1-%Pred-Pre: 63 %
FEV1-Post: 2.32 L
FEV1-Pre: 2.27 L
FEV1FVC-%Change-Post: 2 %
FEV1FVC-%Pred-Pre: 93 %
FEV6-%Change-Post: 0 %
FEV6-%Pred-Post: 70 %
FEV6-%Pred-Pre: 71 %
FEV6-Post: 3.21 L
FEV6-Pre: 3.23 L
FEV6FVC-%Change-Post: 0 %
FEV6FVC-%Pred-Post: 104 %
FEV6FVC-%Pred-Pre: 104 %
FVC-%Change-Post: 0 %
FVC-%Pred-Post: 67 %
FVC-%Pred-Pre: 67 %
FVC-Post: 3.23 L
FVC-Pre: 3.24 L
Post FEV1/FVC ratio: 72 %
Post FEV6/FVC ratio: 99 %
Pre FEV1/FVC ratio: 70 %
Pre FEV6/FVC Ratio: 100 %
RV % pred: 144 %
RV: 3.47 L
TLC % pred: 97 %
TLC: 7.02 L

## 2019-05-18 NOTE — Progress Notes (Signed)
PFT done today. 

## 2019-05-29 ENCOUNTER — Encounter: Payer: Self-pay | Admitting: Pulmonary Disease

## 2019-05-29 ENCOUNTER — Other Ambulatory Visit: Payer: Self-pay

## 2019-05-29 ENCOUNTER — Ambulatory Visit: Payer: Managed Care, Other (non HMO) | Admitting: Pulmonary Disease

## 2019-05-29 VITALS — BP 128/80 | HR 86 | Temp 98.3°F | Ht 71.0 in | Wt 325.4 lb

## 2019-05-29 DIAGNOSIS — J449 Chronic obstructive pulmonary disease, unspecified: Secondary | ICD-10-CM

## 2019-05-29 DIAGNOSIS — Z87891 Personal history of nicotine dependence: Secondary | ICD-10-CM | POA: Diagnosis not present

## 2019-05-29 DIAGNOSIS — Z122 Encounter for screening for malignant neoplasm of respiratory organs: Secondary | ICD-10-CM

## 2019-05-29 DIAGNOSIS — Z720 Tobacco use: Secondary | ICD-10-CM

## 2019-05-29 MED ORDER — ANORO ELLIPTA 62.5-25 MCG/INH IN AEPB: 1.0000 | INHALATION_SPRAY | Freq: Every day | RESPIRATORY_TRACT | 0 refills | Status: DC

## 2019-05-29 NOTE — Patient Instructions (Signed)
I have reviewed your PFTs which show moderate COPD We will stop Advair and start you on Anoro

## 2019-05-29 NOTE — Progress Notes (Signed)
Patient seen in the office today and instructed on use of Anoro.  Patient expressed understanding and demonstrated technique. 

## 2019-05-29 NOTE — Addendum Note (Signed)
Addended by: Hildred Alamin I on: 05/29/2019 02:34 PM   Modules accepted: Orders

## 2019-05-29 NOTE — Progress Notes (Signed)
Nicholas Foley    [id number]    1951-08-04  Primary Care Physician:Webb, Arbie Cookey, MD  Referring Physician: [name], MD [address],  [place] [address]  Chief complaint: Consult for COPD  HPI: 68 year old with history of fibrillation, CHF, COPD, morbid obesity, severe OSA Sent here for evaluation of his lungs.  He has got obstruction by PFTs in 2014.  Maintained on fluticasone, salmeterol inhaler which is generic form of Advair. He has had significant issues with his heart recently with atrial fibrillation, chronic CHF, nonischemic cardiomyopathy [EF 35-40%].  Had implantation of a biventricular pacemaker in January 2019.  He has severe OSA and is followed by Dr. Radford Pax, cardiology  Chief complaint today is dyspnea on exertion, he has symptoms sometimes at rest.  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  Quit in 2013 Travel history: No significant travel Relevant family history: Significant family history of emphysema  Interim history: Here for review of PFTs.  States that breathing is stable.  Continues on Advair  Outpatient Encounter Medications as of 05/29/2019  Medication Sig  . acetaminophen (TYLENOL) 650 MG CR tablet Take 1,300 mg by mouth every 8 (eight) hours as needed for pain.   [name] apixaban (ELIQUIS) 5 MG TABS tablet Take 1 tablet (5 mg total) by mouth 2 (two) times daily.  . carvedilol (COREG) 6.25 MG tablet TAKE 1 TABLET(6.25 MG) BY MOUTH TWICE DAILY  . cholecalciferol (VITAMIN D3) 25 MCG (1000 UT) tablet Take 1,000 Units by mouth daily.  [name] CIALIS 20 MG tablet Take 20 mg by mouth daily as needed for erectile dysfunction.   . fluticasone (FLONASE) 50 MCG/ACT nasal spray Place 1 spray into both nostrils 2 (two) times daily.   . Fluticasone-Salmeterol (ADVAIR) 250-50  MCG/DOSE AEPB Inhale 1 puff into the lungs 2 (two) times daily.   . furosemide (LASIX) 40 MG tablet Take 80 mg by mouth 2 (two) times daily.  . Multiple Vitamins-Minerals (PRESERVISION AREDS 2 PO) Take 1 tablet by mouth 2 (two) times daily.   [name] omeprazole (PRILOSEC OTC) 20 MG tablet Take 20 mg by mouth daily.  . potassium chloride SA (K-DUR) 20 MEQ tablet Take 20 mEq by mouth 2 (two) times daily.  . sacubitril-valsartan (ENTRESTO) 49-51 MG Take 1 tablet by mouth 2 (two) times daily.  . vitamin B-12 (CYANOCOBALAMIN) 1000 MCG tablet Take 1,000 mcg by mouth daily.  [name] zolpidem (AMBIEN CR) 12.5 MG CR tablet Take 1 tablet by mouth at bedtime as needed for sleep.    No facility-administered encounter medications on file as of 05/29/2019.    Physical Exam: Blood pressure 128/80, pulse 86, temperature 98.3 F (36.8 C), temperature source Oral, height 5\' 11"  (1.803 m), weight (!) 325 lb 6.4 oz (147.6 kg), SpO2 96 %. Gen:      No acute distress HEENT:  EOMI, sclera anicteric Neck:     No masses; no thyromegaly Lungs:    Clear to auscultation bilaterally; normal respiratory effort CV:         Regular rate and rhythm; no murmurs Abd:      + bowel sounds; soft, non-tender; no palpable masses, no distension Ext:    No edema; adequate peripheral perfusion Skin:      Warm and dry; no rash Neuro: alert and oriented x  3 Psych: normal mood and affect  Data Reviewed: Imaging: Chest x-ray 12/15/2018- biventricular pacemaker, no acute pulmonary abnormality I have reviewed the images personally  PFTs: 03/26/2013 FVC 4.37 [94%], FEV1 2.65 [81%), F/F 61, TLC 121%, DLCO 90% Mild obstructive airway disease with hyperinflation and air trapping.  No bronchodilator response  05/18/19 FVC 3.23 (69%], FEV1 2.32 (65%), F/F 72, TLC 7.02 [97%], DLCO 23.7 [85%] Moderate obstructive artery disease with air trapping  Labs CBC 11/04/2019-WBC 7.7, eos 2.1%, absolute eosinophil count, 162 IgE 01/04/2019- 857 Alpha-1  antitrypsin 01/04/2019- 161, PI MM  Assessment:  COPD PFTs reviewed with moderate obstruction We will change Advair to Anoro.  I do not believe he will need inhaled steroids as peripheral eosinophils are low  Encouraged to lose weight with diet and exercise.  Ex-smoker Refer for low-dose screening CT of the chest  Severe OSA On CPAP.  Follows with Dr. Radford Pax.  Health maintenance States that he is up-to-date with pneumonia vaccination at his primary care.  Plan/Recommendations: - Stop Advair, start Anoro - Low-dose screening CT of the chest  Marshell Garfinkel MD  Pulmonary and Critical Care 05/29/2019, 2:09 PM  CC: [name], MD

## 2019-05-30 ENCOUNTER — Other Ambulatory Visit: Payer: Self-pay | Admitting: Pulmonary Disease

## 2019-05-30 DIAGNOSIS — Z720 Tobacco use: Secondary | ICD-10-CM

## 2019-05-30 NOTE — Addendum Note (Signed)
Addended by: Parke Poisson E on: 05/30/2019 09:22 AM   Modules accepted: Orders

## 2019-06-11 ENCOUNTER — Other Ambulatory Visit: Payer: Self-pay | Admitting: Nurse Practitioner

## 2019-06-11 ENCOUNTER — Telehealth: Payer: Self-pay | Admitting: Pulmonary Disease

## 2019-06-11 DIAGNOSIS — Z87891 Personal history of nicotine dependence: Secondary | ICD-10-CM

## 2019-06-11 DIAGNOSIS — Z122 Encounter for screening for malignant neoplasm of respiratory organs: Secondary | ICD-10-CM

## 2019-06-12 NOTE — Telephone Encounter (Signed)
Langley Gauss is not in the office today. She will contact pt when she returns to the office to get him scheduled.

## 2019-06-13 NOTE — Telephone Encounter (Signed)
Denise aware to call pt for scheduling upon her return.

## 2019-06-13 NOTE — Telephone Encounter (Signed)
Pt returning Nicholas Foley phone call about lung screening

## 2019-06-14 NOTE — Telephone Encounter (Signed)
Spoke with pt and scheduled SDMV 07/09/19 3:00 CT ordered Nothing further needed

## 2019-06-14 NOTE — Telephone Encounter (Signed)
LMTC x 1  

## 2019-06-18 ENCOUNTER — Ambulatory Visit (INDEPENDENT_AMBULATORY_CARE_PROVIDER_SITE_OTHER): Payer: Managed Care, Other (non HMO)

## 2019-06-18 DIAGNOSIS — Z950 Presence of cardiac pacemaker: Secondary | ICD-10-CM | POA: Diagnosis not present

## 2019-06-18 DIAGNOSIS — I5042 Chronic combined systolic (congestive) and diastolic (congestive) heart failure: Secondary | ICD-10-CM | POA: Diagnosis not present

## 2019-06-18 NOTE — Progress Notes (Signed)
EPIC Encounter for ICM Monitoring  Patient Name: Nicholas Foley is a 68 y.o. male Date: 06/18/2019 Primary Care Physican: [name], MD Primary Cardiologist: [name] Electrophysiologist: Allred Bi-V Pacing:   97.3%       Last Weight:  320 lbs                                                            Spoke with patient and he doing well.  He denies any fluid symptoms.  He also travels frequently.   Optivol thoracic impedance trending slightly below baseline normal.   Prescribed: Furosemide 40 mg take 80 mg twice a day.  Potassium 20 mEq take 1 tablet twice a day.   Labs: 12/15/2018 Creatinine 1.24, BUN 18, Potassium 3.7, Sodium 136, GFR >60  Recommendations:  Advise to limit salt intake and call if experiencing any fluid symptoms.   Follow-up plan: ICM clinic phone appointment on 07/19/2019.  Office appointment with Dr [name] on 07/20/2019  Copy of ICM check sent to Dr. [name] and Dr [name].   3 month ICM trend: 06/18/2019    1 Year ICM trend:       Rosalene Billings, RN 06/18/2019 2:39 PM

## 2019-06-25 ENCOUNTER — Other Ambulatory Visit: Payer: Self-pay

## 2019-06-25 ENCOUNTER — Encounter (INDEPENDENT_AMBULATORY_CARE_PROVIDER_SITE_OTHER): Payer: Managed Care, Other (non HMO) | Admitting: Ophthalmology

## 2019-06-25 DIAGNOSIS — H33302 Unspecified retinal break, left eye: Secondary | ICD-10-CM

## 2019-06-25 DIAGNOSIS — H43813 Vitreous degeneration, bilateral: Secondary | ICD-10-CM

## 2019-06-25 DIAGNOSIS — H353122 Nonexudative age-related macular degeneration, left eye, intermediate dry stage: Secondary | ICD-10-CM | POA: Diagnosis not present

## 2019-06-25 DIAGNOSIS — D1809 Hemangioma of other sites: Secondary | ICD-10-CM | POA: Diagnosis not present

## 2019-06-25 DIAGNOSIS — H35033 Hypertensive retinopathy, bilateral: Secondary | ICD-10-CM

## 2019-06-25 DIAGNOSIS — H2513 Age-related nuclear cataract, bilateral: Secondary | ICD-10-CM

## 2019-06-25 DIAGNOSIS — I10 Essential (primary) hypertension: Secondary | ICD-10-CM

## 2019-06-26 ENCOUNTER — Ambulatory Visit: Payer: Managed Care, Other (non HMO)

## 2019-07-09 ENCOUNTER — Other Ambulatory Visit: Payer: Self-pay

## 2019-07-09 ENCOUNTER — Encounter: Payer: Self-pay | Admitting: Acute Care

## 2019-07-09 ENCOUNTER — Ambulatory Visit: Payer: Managed Care, Other (non HMO) | Admitting: Acute Care

## 2019-07-09 ENCOUNTER — Ambulatory Visit (INDEPENDENT_AMBULATORY_CARE_PROVIDER_SITE_OTHER)
Admission: RE | Admit: 2019-07-09 | Discharge: 2019-07-09 | Disposition: A | Discharge status: Home / Self Care | Payer: Managed Care, Other (non HMO) | Source: Ambulatory Visit | Attending: Acute Care | Admitting: Acute Care

## 2019-07-09 VITALS — BP 140/78 | HR 82 | Temp 98.4°F | Ht 71.0 in | Wt 325.8 lb

## 2019-07-09 DIAGNOSIS — Z87891 Personal history of nicotine dependence: Secondary | ICD-10-CM

## 2019-07-09 DIAGNOSIS — Z122 Encounter for screening for malignant neoplasm of respiratory organs: Secondary | ICD-10-CM

## 2019-07-09 NOTE — Progress Notes (Signed)
Shared Decision Making Visit Lung Cancer Screening Program [phone])   Eligibility:  Age 68 y.o.  Pack Years Smoking History Calculation 64 pack year smoking history (# packs/per year x # years smoked)  Recent History of coughing up blood  no  Unexplained weight loss? no ( >Than 15 pounds within the last 6 months )  Prior History Lung / other cancer no (Diagnosis within the last 5 years already requiring surveillance chest CT Scans).  Smoking Status Former Smoker  Former Smokers: Years since quit: 7 years  Quit Date: 2013  Visit Components:  Discussion included one or more decision making aids. yes  Discussion included risk/benefits of screening. yes  Discussion included potential follow up diagnostic testing for abnormal scans. yes  Discussion included meaning and risk of over diagnosis. yes  Discussion included meaning and risk of False Positives. yes  Discussion included meaning of total radiation exposure. yes  Counseling Included:  Importance of adherence to annual lung cancer LDCT screening. yes  Impact of comorbidities on ability to participate in the program. yes  Ability and willingness to under diagnostic treatment. yes  Smoking Cessation Counseling:  Current Smokers:   Discussed importance of smoking cessation. yes  Information about tobacco cessation classes and interventions provided to patient. yes  Patient provided with "ticket" for LDCT Scan. yes  Symptomatic Patient. no  Counseling  Diagnosis Code: Tobacco Use Z72.0  Asymptomatic Patient yes  Counseling (Intermediate counseling: > three minutes counseling) H3716  Former Smokers:   Discussed the importance of maintaining cigarette abstinence. yes  Diagnosis Code: Personal History of Nicotine Dependence. R67.893  Information about tobacco cessation classes and interventions provided to patient. Yes  Patient provided with "ticket" for LDCT Scan. yes  Written Order for Lung Cancer  Screening with LDCT placed in Epic. Yes (CT Chest Lung Cancer Screening Low Dose W/O CM) [id number] Z12.2-Screening of respiratory organs Z87.891-Personal history of nicotine dependence  BP 140/78 (BP Location: Right Arm, Cuff Size: Large)   Pulse 82   Temp 98.4 F (36.9 C) (Oral)   Ht 5\' 11"  (1.803 m)   Wt (!) 325 lb 12.8 oz (147.8 kg)   SpO2 97%   BMI 45.44 kg/m    I spent 25 minutes of face to face time with Nicholas Foley discussing the risks and benefits of lung cancer screening. We viewed a power point together that explained in detail the above noted topics. We took the time to pause the power point at intervals to allow for questions to be asked and answered to ensure understanding. We discussed that he had taken the single most powerful action possible to decrease his risk of developing lung cancer when he quit smoking. I counseled him to remain smoke free, and to contact me if he ever had the desire to smoke again so that I can provide resources and tools to help support the effort to remain smoke free. We discussed the time and location of the scan, and that either  Doroteo Glassman RN or I will call with the results within  24-48 hours of receiving them. He has my card and contact information in the event he needs to speak with me, in addition to a copy of the power point we reviewed as a resource. He verbalized understanding of all of the above and had no further questions upon leaving the office.     I explained to the patient that there has been a high incidence of coronary artery disease noted on these exams. I  explained that this is a non-gated exam therefore degree or severity cannot be determined. This patient is not on statin therapy. I have asked the patient to follow-up with their PCP regarding any incidental finding of coronary artery disease and management with diet or medication as they feel is clinically indicated. The patient verbalized understanding of the above and had no  further questions.     Nicholas Spatz, NP 07/09/2019 3:34 PM

## 2019-07-13 ENCOUNTER — Other Ambulatory Visit: Payer: Self-pay

## 2019-07-13 ENCOUNTER — Other Ambulatory Visit: Payer: Managed Care, Other (non HMO) | Admitting: *Deleted

## 2019-07-13 ENCOUNTER — Other Ambulatory Visit: Payer: Self-pay | Admitting: *Deleted

## 2019-07-13 DIAGNOSIS — I5042 Chronic combined systolic (congestive) and diastolic (congestive) heart failure: Secondary | ICD-10-CM

## 2019-07-13 DIAGNOSIS — Z87891 Personal history of nicotine dependence: Secondary | ICD-10-CM

## 2019-07-13 DIAGNOSIS — I480 Paroxysmal atrial fibrillation: Secondary | ICD-10-CM

## 2019-07-13 DIAGNOSIS — Z79899 Other long term (current) drug therapy: Secondary | ICD-10-CM

## 2019-07-13 DIAGNOSIS — Z7901 Long term (current) use of anticoagulants: Secondary | ICD-10-CM

## 2019-07-13 DIAGNOSIS — Z122 Encounter for screening for malignant neoplasm of respiratory organs: Secondary | ICD-10-CM

## 2019-07-14 LAB — HEPATIC FUNCTION PANEL
ALT: 19 IU/L (ref 0–44)
AST: 15 IU/L (ref 0–40)
Albumin: 4.3 g/dL (ref 3.8–4.8)
Alkaline Phosphatase: 80 IU/L (ref 39–117)
Bilirubin Total: 0.3 mg/dL (ref 0.0–1.2)
Bilirubin, Direct: 0.09 mg/dL (ref 0.00–0.40)
Total Protein: 7.1 g/dL (ref 6.0–8.5)

## 2019-07-14 LAB — BASIC METABOLIC PANEL WITH GFR
BUN/Creatinine Ratio: 14 (ref 10–24)
BUN: 16 mg/dL (ref 8–27)
CO2: 22 mmol/L (ref 20–29)
Calcium: 8.9 mg/dL (ref 8.6–10.2)
Chloride: 103 mmol/L (ref 96–106)
Creatinine, Ser: 1.12 mg/dL (ref 0.76–1.27)
GFR calc Af Amer: 79 mL/min/1.73
GFR calc non Af Amer: 68 mL/min/1.73
Glucose: 75 mg/dL (ref 65–99)
Potassium: 4.1 mmol/L (ref 3.5–5.2)
Sodium: 142 mmol/L (ref 134–144)

## 2019-07-14 LAB — PRO B NATRIURETIC PEPTIDE: NT-Pro BNP: 1410 pg/mL — ABNORMAL HIGH (ref 0–376)

## 2019-07-18 ENCOUNTER — Ambulatory Visit (INDEPENDENT_AMBULATORY_CARE_PROVIDER_SITE_OTHER): Payer: Managed Care, Other (non HMO) | Admitting: *Deleted

## 2019-07-18 DIAGNOSIS — I428 Other cardiomyopathies: Secondary | ICD-10-CM

## 2019-07-19 ENCOUNTER — Ambulatory Visit (INDEPENDENT_AMBULATORY_CARE_PROVIDER_SITE_OTHER): Payer: Managed Care, Other (non HMO)

## 2019-07-19 DIAGNOSIS — Z950 Presence of cardiac pacemaker: Secondary | ICD-10-CM

## 2019-07-19 DIAGNOSIS — I5042 Chronic combined systolic (congestive) and diastolic (congestive) heart failure: Secondary | ICD-10-CM

## 2019-07-20 ENCOUNTER — Ambulatory Visit: Payer: Managed Care, Other (non HMO) | Admitting: Interventional Cardiology

## 2019-07-20 ENCOUNTER — Other Ambulatory Visit: Payer: Self-pay

## 2019-07-20 ENCOUNTER — Encounter: Payer: Self-pay | Admitting: Interventional Cardiology

## 2019-07-20 VITALS — BP 148/88 | HR 90 | Ht 71.0 in | Wt 325.0 lb

## 2019-07-20 DIAGNOSIS — G4733 Obstructive sleep apnea (adult) (pediatric): Secondary | ICD-10-CM

## 2019-07-20 DIAGNOSIS — Z7189 Other specified counseling: Secondary | ICD-10-CM

## 2019-07-20 DIAGNOSIS — I447 Left bundle-branch block, unspecified: Secondary | ICD-10-CM | POA: Diagnosis not present

## 2019-07-20 DIAGNOSIS — I5042 Chronic combined systolic (congestive) and diastolic (congestive) heart failure: Secondary | ICD-10-CM | POA: Diagnosis not present

## 2019-07-20 DIAGNOSIS — Z950 Presence of cardiac pacemaker: Secondary | ICD-10-CM | POA: Diagnosis not present

## 2019-07-20 DIAGNOSIS — I10 Essential (primary) hypertension: Secondary | ICD-10-CM

## 2019-07-20 MED ORDER — SPIRONOLACTONE 25 MG PO TABS: 12.5000 mg | ORAL_TABLET | Freq: Every day | ORAL | 3 refills | Status: DC

## 2019-07-20 MED ORDER — POTASSIUM CHLORIDE CRYS ER 20 MEQ PO TBCR: 20.0000 meq | EXTENDED_RELEASE_TABLET | Freq: Every day | ORAL | 3 refills | Status: DC

## 2019-07-20 NOTE — Patient Instructions (Signed)
Medication Instructions:  1) START Spironolactone 12.5mg  once daily 2) DECREASE Potassium to 2meq once daily  If you need a refill on your cardiac medications before your next appointment, please call your pharmacy.   Lab work: Your physician recommends that you return for lab work in: 1 week (BMET)  If you have labs (blood work) drawn today and your tests are completely normal, you will receive your results only by: [name] MyChart Message (if you have MyChart) OR . A paper copy in the mail If you have any lab test that is abnormal or we need to change your treatment, we will call you to review the results.  Testing/Procedures: None  Follow-Up: At [facility], you and your health needs are our priority.  As part of our continuing mission to provide you with exceptional heart care, we have created designated Provider Care Teams.  These Care Teams include your primary Cardiologist (physician) and Advanced Practice Providers (APPs -  Physician Assistants and Nurse Practitioners) who all work together to provide you with the care you need, when you need it. You will need a follow up appointment in 6 months.  Please call our office 2 months in advance to schedule this appointment.  You may see [name], MD or one of the following Advanced Practice Providers on your designated Care Team:   [name], NP [name], NP . [name], NP  Any Other Special Instructions Will Be Listed Below (If Applicable).

## 2019-07-20 NOTE — Progress Notes (Signed)
Cardiology Office Note:    Date:  07/20/2019   ID:  Nicholas Foley, DOB 04/17/1951, MRN [id number]  PCP:  [name], MD  Cardiologist:  [name], MD   Referring MD: [name], MD   Chief Complaint  Patient presents with  . Atrial Fibrillation  . Congestive Heart Failure  . Obesity    History of Present Illness:    Nicholas Foley is a 68 y.o. male with a hx of paroxysmal atrial fibrillation, amiodarone therapy, obstructive sleep apnea, hypertension,chronic anticoagulation (Apixaban),chroniccombined systolic anddiastolic heart failure, morbid obesity, and left bundle branch block.Underwent CRT 04/44, uncomplicated.  He was given a new inhaler and developed significant coughing.  He stopped using it and he is just now getting better.  The new agent was unknown ANORO. He has subsequently switch back to Advair.  His blood pressure is relatively high for what we want given his heart situation.  I will add low-dose menorrhalgia corticoid receptor antagonist therapy in the form of Aldactone 12.5 mg/day.  Decrease K. Dur to 20 mEq/day.  Be met in 1 week.  Past Medical History:  Diagnosis Date  . Atrial fibrillation with rapid ventricular response ([place]) 08/02/2013  . CHF (congestive heart failure) ([place])   . Chronic systolic dysfunction of left ventricle 08/02/2013   BNP greater than 2000   . COPD (chronic obstructive pulmonary disease) (HCC)    Moderate by PFTs with response to bronchodilators, May 2014  . Erectile dysfunction   . Hypertension   . Insomnia 08/02/13  . Left bundle branch block 08/02/2013  . Morbid obesity ([place]) 08/02/2013  . OSA (obstructive sleep apnea)    severe OSA with AHI 74/hr now on CPAP at 8cm H2O    Past Surgical History:  Procedure Laterality Date  . BIV PACEMAKER INSERTION CRT-P N/A 12/14/2018   Medtronic model [id number] Percepta Quad CRT-P MRI Rolan Bucco (serial Number [id number]) device implanted by Dr [name] for CHF and LBBB  . CARDIOVERSION  N/A 08/06/2013   Procedure: CARDIOVERSION;  Surgeon: Lelon Perla, MD;  Location[facility] ENDOSCOPY;  Service: Cardiovascular;  Laterality: N/A;  spoke with [name]   . CARDIOVERSION N/A 09/03/2013   Procedure: CARDIOVERSION;  Surgeon: [name], MD;  Location: [facility] ENDOSCOPY;  Service: Cardiovascular;  Laterality: N/A;  . chronic systolic dysfu    . NASAL SEPTUM SURGERY    . RIGHT/LEFT HEART CATH AND CORONARY ANGIOGRAPHY N/A 12/04/2018   Procedure: RIGHT/LEFT HEART CATH AND CORONARY ANGIOGRAPHY;  Surgeon: Belva Crome, MD;  Location: [place] CV LAB;  Service: Cardiovascular;  Laterality: N/A;  . TEE WITHOUT CARDIOVERSION N/A 08/06/2013   Procedure: TRANSESOPHAGEAL ECHOCARDIOGRAM (TEE);  Surgeon: Lelon Perla, MD;  Location: [facility] ENDOSCOPY;  Service: Cardiovascular;  Laterality: N/A;  . VASECTOMY      Current Medications: Current Meds  Medication Sig  . acetaminophen (TYLENOL) 650 MG CR tablet Take 1,300 mg by mouth every 8 (eight) hours as needed for pain.   [name] apixaban (ELIQUIS) 5 MG TABS tablet Take 1 tablet (5 mg total) by mouth 2 (two) times daily.  . carvedilol (COREG) 6.25 MG tablet TAKE 1 TABLET(6.25 MG) BY MOUTH TWICE DAILY  . cholecalciferol (VITAMIN D3) 25 MCG (1000 UT) tablet Take 1,000 Units by mouth daily.  [name] CIALIS 20 MG tablet Take 20 mg by mouth daily as needed for erectile dysfunction.   . cyclobenzaprine (FLEXERIL) 10 MG tablet Take 10 mg by mouth as needed.  . fluticasone (FLONASE) 50 MCG/ACT  nasal spray Place 1 spray into both nostrils 2 (two) times daily.   . Fluticasone-Salmeterol (ADVAIR) 250-50 MCG/DOSE AEPB Inhale 1 puff into the lungs 2 (two) times daily.   . furosemide (LASIX) 40 MG tablet Take 80 mg by mouth 2 (two) times daily.  . Multiple Vitamins-Minerals (PRESERVISION AREDS 2 PO) Take 1 tablet by mouth 2 (two) times daily.   [name] omeprazole (PRILOSEC OTC) 20 MG tablet Take 20 mg by mouth daily.  . potassium chloride SA (K-DUR) 20 MEQ tablet Take 20  mEq by mouth 2 (two) times daily.  . sacubitril-valsartan (ENTRESTO) 49-51 MG Take 1 tablet by mouth 2 (two) times daily.  [name] umeclidinium-vilanterol (ANORO ELLIPTA) 62.5-25 MCG/INH AEPB Inhale 1 puff into the lungs daily.  . vitamin B-12 (CYANOCOBALAMIN) 1000 MCG tablet Take 1,000 mcg by mouth daily.  [name] zolpidem (AMBIEN CR) 12.5 MG CR tablet Take 1 tablet by mouth at bedtime as needed for sleep.      Allergies:   Patient has no known allergies.   Social History   Socioeconomic History  . Marital status: Married    Spouse name: Not on file  . Number of children: Not on file  . Years of education: Not on file  . Highest education level: Not on file  Occupational History  . Occupation: real estate    Comment: investment  Social Needs  . Financial resource strain: Not on file  . Food insecurity    Worry: Not on file    Inability: Not on file  . Transportation needs    Medical: Not on file    Non-medical: Not on file  Tobacco Use  . Smoking status: Former Smoker    Packs/day: 1.50    Years: 43.00    Pack years: 64.50    Types: Cigarettes, Cigars    Quit date: 08/02/2012    Years since quitting: 6.9  . Smokeless tobacco: Never Used  Substance and Sexual Activity  . Alcohol use: Yes    Alcohol/week: 0.0 standard drinks    Comment: once a week; previously 3-4 times per week   . Drug use: No  . Sexual activity: Yes  Lifestyle  . Physical activity    Days per week: Not on file    Minutes per session: Not on file  . Stress: Not on file  Relationships  . Social Herbalist on phone: Not on file    Gets together: Not on file    Attends religious service: Not on file    Active member of club or organization: Not on file    Attends meetings of clubs or organizations: Not on file    Relationship status: Not on file  Other Topics Concern  . Not on file  Social History Narrative   Works as a Engineer, structural for apartments   Lives with wife.  They have one grown  daughter   Highest level of education:  college     Family History: The patient's family history includes Atrial fibrillation in his mother; COPD in his father; Cerebral aneurysm in his brother; Healthy in his daughter.  ROS:   Please see the history of present illness.    Overall says he still feels improved since switching heart failure therapy to the the more updated protocol.  All other systems reviewed and are negative.  EKGs/Labs/Other Studies Reviewed:    The following studies were reviewed today: Reviewed OptiVol.  Volume status relatively stable.  EKG:  EKG not  performed  Recent Labs: 09/25/2018: TSH 1.500 01/04/2019: Hemoglobin 14.4; Platelets 222.0 07/13/2019: ALT 19; BUN 16; Creatinine, Ser 1.12; NT-Pro BNP 1,410; Potassium 4.1; Sodium 142  Recent Lipid Panel    Component Value Date/Time   CHOL 184 08/03/2013 0640   TRIG 149 08/03/2013 0640   HDL 50 08/03/2013 0640   CHOLHDL 3.7 08/03/2013 0640   VLDL 30 08/03/2013 0640   LDLCALC 104 (H) 08/03/2013 0640    Physical Exam:    VS:  BP (!) 148/88   Pulse 90   Ht '5\' 11"'  (1.803 m)   Wt (!) 325 lb (147.4 kg)   SpO2 95%   BMI 45.33 kg/m     Wt Readings from Last 3 Encounters:  07/20/19 (!) 325 lb (147.4 kg)  07/09/19 (!) 325 lb 12.8 oz (147.8 kg)  05/29/19 (!) 325 lb 6.4 oz (147.6 kg)     GEN: Marked obesity. No acute distress HEENT: Normal NECK: No JVD. LYMPHATICS: No lymphadenopathy CARDIAC:  RRR without murmur, gallop, or edema. VASCULAR:  Normal Pulses. No bruits. RESPIRATORY:  Clear to auscultation without rales, wheezing or rhonchi  ABDOMEN: Soft, non-tender, non-distended, No pulsatile mass, MUSCULOSKELETAL: No deformity  SKIN: Warm and dry NEUROLOGIC:  Alert and oriented x 3 PSYCHIATRIC:  Normal affect   ASSESSMENT:    1. Chronic combined systolic and diastolic heart failure (HCC)   2. Cardiac resynchronization therapy pacemaker (CRT-P) in place   3. Left bundle branch block   4. OSA  (obstructive sleep apnea)   5. Hypertension, essential   6. Morbid obesity ([place])   7. Educated About Covid-19 Virus Infection    PLAN:    In order of problems listed above:  1. Stable without evidence of significant volume overload.  OptiVol values are stable. 2. Improvement in overall status since resynchronization. 3. Not reassessed 4. Compliant with CPAP 5. Blood pressures too high.  Add low-dose mineralocorticoid receptor antagonist, Aldactone 12.5 mg daily.  Bmet in 1 week.  Decrease potassium by 50% from 20 mEq twice daily to once per day. 6. Caloric intake reduction and physical activity increase stressed. 7. Social distancing, handwashing, and mass wearing is encouraged.  Clinical follow-up in 4 to 6 months.  Be met in 1 week.   Medication Adjustments/Labs and Tests Ordered: Current medicines are reviewed at length with the patient today.  Concerns regarding medicines are outlined above.  No orders of the defined types were placed in this encounter.  No orders of the defined types were placed in this encounter.   There are no Patient Instructions on file for this visit.   Signed, [name], MD  07/20/2019 2:00 PM    [place]

## 2019-07-20 NOTE — Progress Notes (Signed)
EPIC Encounter for ICM Monitoring  Patient Name: Nicholas Foley is a 68 y.o. male Date: 07/20/2019 Primary Care Physican: Nicholas Small, MD Primary Cardiologist:Nicholas Foley Electrophysiologist:Nicholas Foley Bi-V Pacing:97.2% LastWeight: 320 lbs    Attempted call to patient and unable to reach.  Transmission reviewed.   Optivol thoracic impedance normal with a couple of days of possible fluid accumulation in the last month.   Prescribed: Furosemide40 mg take 80 mg twice a day. Potassium 20 mEq take 1 tablet twice a day.   Labs: 12/15/2018 Creatinine 1.24, BUN 18, Potassium 3.7, Sodium 136, GFR >60  Recommendations: Unable to reach.    Follow-up plan: ICM clinic phone appointment on 09/03/2019. Office appointment with Nicholas Foley on 07/20/2019  Copy of ICM check sent to Nicholas Foley and Nicholas Foley.   3 month ICM trend: 07/18/2019    1 Year ICM trend:       Nicholas Billings, RN 07/20/2019 7:48 AM

## 2019-07-23 ENCOUNTER — Other Ambulatory Visit: Payer: Self-pay | Admitting: Family Medicine

## 2019-07-23 DIAGNOSIS — R9389 Abnormal findings on diagnostic imaging of other specified body structures: Secondary | ICD-10-CM

## 2019-07-23 LAB — CUP PACEART REMOTE DEVICE CHECK
Battery Remaining Longevity: 155 mo
Battery Voltage: 3.11 V
Brady Statistic AP VP Percent: 0.08 %
Brady Statistic AP VS Percent: 0.02 %
Brady Statistic AS VP Percent: 97.14 %
Brady Statistic AS VS Percent: 2.76 %
Brady Statistic RA Percent Paced: 0.11 %
Brady Statistic RV Percent Paced: 12.37 %
Date Time Interrogation Session: 20200827000427
Implantable Lead Implant Date: 20200123
Implantable Lead Implant Date: 20200123
Implantable Lead Implant Date: 20200123
Implantable Lead Location: 753858
Implantable Lead Location: 753859
Implantable Lead Location: 753860
Implantable Lead Model: 4598
Implantable Lead Model: 5076
Implantable Lead Model: 5076
Implantable Pulse Generator Implant Date: 20200123
Lead Channel Impedance Value: 1026 Ohm
Lead Channel Impedance Value: 1083 Ohm
Lead Channel Impedance Value: 1140 Ohm
Lead Channel Impedance Value: 1216 Ohm
Lead Channel Impedance Value: 1311 Ohm
Lead Channel Impedance Value: 1349 Ohm
Lead Channel Impedance Value: 342 Ohm
Lead Channel Impedance Value: 418 Ohm
Lead Channel Impedance Value: 475 Ohm
Lead Channel Impedance Value: 589 Ohm
Lead Channel Impedance Value: 646 Ohm
Lead Channel Impedance Value: 703 Ohm
Lead Channel Impedance Value: 760 Ohm
Lead Channel Impedance Value: 817 Ohm
Lead Channel Pacing Threshold Amplitude: 0.75 V
Lead Channel Pacing Threshold Amplitude: 0.875 V
Lead Channel Pacing Threshold Amplitude: 0.875 V
Lead Channel Pacing Threshold Pulse Width: 0.4 ms
Lead Channel Pacing Threshold Pulse Width: 0.4 ms
Lead Channel Pacing Threshold Pulse Width: 0.5 ms
Lead Channel Sensing Intrinsic Amplitude: 1.25 mV
Lead Channel Sensing Intrinsic Amplitude: 1.25 mV
Lead Channel Sensing Intrinsic Amplitude: 12.125 mV
Lead Channel Sensing Intrinsic Amplitude: 12.125 mV
Lead Channel Setting Pacing Amplitude: 1.5 V
Lead Channel Setting Pacing Amplitude: 1.75 V
Lead Channel Setting Pacing Amplitude: 2.5 V
Lead Channel Setting Pacing Pulse Width: 0.4 ms
Lead Channel Setting Pacing Pulse Width: 0.5 ms
Lead Channel Setting Sensing Sensitivity: 2 mV

## 2019-07-25 ENCOUNTER — Telehealth: Payer: Self-pay | Admitting: Interventional Cardiology

## 2019-07-25 NOTE — Telephone Encounter (Signed)
Spoke with pt and rescheduled for labs on Tuesday.  Advised if still not feeling well, contact the office.  Advised when supply gets low, refills were sent to pharmacy so contact them.  Pt verbalized understanding and was appreciative for call.

## 2019-07-25 NOTE — Telephone Encounter (Signed)
New message:  Patient was supposed to come into the office for labs this Friday 07/27/19. He has come down with a cold or some other sort of virus, and he wants to know when he should reschedule his blood work.  He would also like further orders as to what to do when his new medicine runs out.

## 2019-07-26 ENCOUNTER — Encounter: Payer: Self-pay | Admitting: Cardiology

## 2019-07-26 NOTE — Progress Notes (Signed)
Remote pacemaker transmission.   

## 2019-07-27 ENCOUNTER — Other Ambulatory Visit: Payer: Managed Care, Other (non HMO)

## 2019-07-31 ENCOUNTER — Other Ambulatory Visit: Payer: Managed Care, Other (non HMO)

## 2019-07-31 ENCOUNTER — Other Ambulatory Visit: Payer: Self-pay

## 2019-07-31 DIAGNOSIS — I10 Essential (primary) hypertension: Secondary | ICD-10-CM

## 2019-07-31 DIAGNOSIS — I5042 Chronic combined systolic (congestive) and diastolic (congestive) heart failure: Secondary | ICD-10-CM

## 2019-08-01 ENCOUNTER — Ambulatory Visit
Admission: RE | Admit: 2019-08-01 | Discharge: 2019-08-01 | Disposition: A | Discharge status: Home / Self Care | Payer: Managed Care, Other (non HMO) | Source: Ambulatory Visit | Attending: Family Medicine | Admitting: Family Medicine

## 2019-08-01 DIAGNOSIS — R9389 Abnormal findings on diagnostic imaging of other specified body structures: Secondary | ICD-10-CM

## 2019-08-01 LAB — BASIC METABOLIC PANEL
BUN/Creatinine Ratio: 15 (ref 10–24)
BUN: 20 mg/dL (ref 8–27)
CO2: 24 mmol/L (ref 20–29)
Calcium: 9.3 mg/dL (ref 8.6–10.2)
Chloride: 100 mmol/L (ref 96–106)
Creatinine, Ser: 1.31 mg/dL — ABNORMAL HIGH (ref 0.76–1.27)
GFR calc Af Amer: 65 mL/min/{1.73_m2} (ref >59)
GFR calc non Af Amer: 56 mL/min/{1.73_m2} — ABNORMAL LOW (ref >59)
Glucose: 85 mg/dL (ref 65–99)
Potassium: 4.6 mmol/L (ref 3.5–5.2)
Sodium: 141 mmol/L (ref 134–144)

## 2019-08-02 ENCOUNTER — Telehealth: Payer: Self-pay | Admitting: *Deleted

## 2019-08-02 DIAGNOSIS — I5042 Chronic combined systolic (congestive) and diastolic (congestive) heart failure: Secondary | ICD-10-CM

## 2019-08-02 NOTE — Telephone Encounter (Signed)
Pt has been notified of lab results by phone with verbal understanding. Pt aware to continue on current Tx plan. Repeat BMET 10/9. Pt thanked me for the call. Patient notified of result.  Please refer to phone note from today for complete details.   Julaine Hua, [facility] 08/02/2019 8:28 AM

## 2019-08-16 ENCOUNTER — Telehealth: Payer: Self-pay | Admitting: Interventional Cardiology

## 2019-08-16 NOTE — Telephone Encounter (Signed)
New message ° ° °Patient is returning your call. Please call. °

## 2019-08-16 NOTE — Telephone Encounter (Signed)
Attempted to reach patient to offer DOD slot tomorrow, 08/17/19, per Sonia Baller, RN. No answer, LMOVM requesting call back by 17:30 tonight to my direct number. Will also try wife.  Spoke with patient's wife (DPR). Advised of available slot tomorrow. Provided appointment time and location, as well as Dr. Tanna Furry information. Pt's wife accepted appointment on patient's behalf and will make him aware of appointment.  Patient returned call shortly after disconnecting call with wife. Reports he spoke with Jeannene Patella, RN, and updated her with his plan to take extra furosemide tomorrow (see Pam's note for specifics). Declined appointment. Pt agrees to call wife to update her with plan as she is out of town. No further questions at this time.

## 2019-08-16 NOTE — Telephone Encounter (Signed)
New Message    Pt c/o swelling: STAT is pt has developed SOB within 24 hours  1) How much weight have you gained and in what time span? Patient hasn't weighed himself   2) If swelling, where is the swelling located? All over legs, arms, chest, and belly   3) Are you currently taking a fluid pill? Yes, lasix and patient wants to know if they should increase the dosage  4) Are you currently SOB? Yes   5) Do you have a log of your daily weights (if so, list)? No  6) Have you gained 3 pounds in a day or 5 pounds in a week? N/A  7) Have you traveled recently? No

## 2019-08-16 NOTE — Telephone Encounter (Signed)
Called patient back. He cannot come in tomorrow. Patient stated he would like to just take an extra lasix to help with the edema. Patient stated this has happen in the past and Dr. [name] has him take and 1 extra lasix in the AM and 2 extra in the PM.Will forward for advisement.

## 2019-08-16 NOTE — Telephone Encounter (Signed)
Called patient back about his message. Patient stated he just does not feel well. Patient stated he feels like he is swollen and is about to pop. Patient stated he has not been peeing as much at night as he usually does, and that he is starting to have SOB. Patient stated that in the past Dr. [name] has had him take extra Lasix. Patient is already taking lasix 80 mg BID and spirolactone 12.5 mg daily. Patient does have a device. Will send message to device clinic to see if they can tell anything on their end,  and DOD and his nurse, since Dr. [name] is out today for advisement.

## 2019-08-17 ENCOUNTER — Telehealth: Payer: Self-pay | Admitting: Interventional Cardiology

## 2019-08-17 NOTE — Telephone Encounter (Signed)
Pt agrees and understands to take 120mg  lasix x two doses per Dr. Thompson Caul recommendation. He will send in a remote transmission to see if he is in AF.

## 2019-08-17 NOTE — Telephone Encounter (Signed)
New Message ° ° °Patient returning your call. °

## 2019-08-17 NOTE — Telephone Encounter (Signed)
LVM for return call. 

## 2019-08-17 NOTE — Telephone Encounter (Signed)
We needs weights, HR (Has he gone back into AF), and device interrogation.Okay to take 120 mg Furosemide BID for two doses then back to 80 mg BID

## 2019-08-20 NOTE — Telephone Encounter (Signed)
Reviewed transmission.   VP 96.8%. Normal device function.   Had 25 beat (7 seconds) NSVT 07/20/19  No AF noted.   Will forward to Dr. [name] as Juluis Rainier (Pt taking extra lasix)  Beryle Beams" [place], [place]  08/20/2019 8:55 AM

## 2019-08-30 ENCOUNTER — Other Ambulatory Visit: Payer: Self-pay

## 2019-08-30 ENCOUNTER — Other Ambulatory Visit: Payer: Managed Care, Other (non HMO) | Admitting: *Deleted

## 2019-08-30 DIAGNOSIS — I5042 Chronic combined systolic (congestive) and diastolic (congestive) heart failure: Secondary | ICD-10-CM

## 2019-08-31 ENCOUNTER — Telehealth: Payer: Self-pay | Admitting: *Deleted

## 2019-08-31 ENCOUNTER — Other Ambulatory Visit: Payer: Managed Care, Other (non HMO)

## 2019-08-31 DIAGNOSIS — I5042 Chronic combined systolic (congestive) and diastolic (congestive) heart failure: Secondary | ICD-10-CM

## 2019-08-31 LAB — BASIC METABOLIC PANEL
BUN/Creatinine Ratio: 18 (ref 10–24)
BUN: 21 mg/dL (ref 8–27)
CO2: 22 mmol/L (ref 20–29)
Calcium: 9.2 mg/dL (ref 8.6–10.2)
Chloride: 102 mmol/L (ref 96–106)
Creatinine, Ser: 1.16 mg/dL (ref 0.76–1.27)
GFR calc Af Amer: 75 mL/min/{1.73_m2} (ref >59)
GFR calc non Af Amer: 65 mL/min/{1.73_m2} (ref >59)
Glucose: 84 mg/dL (ref 65–99)
Potassium: 4.4 mmol/L (ref 3.5–5.2)
Sodium: 140 mmol/L (ref 134–144)

## 2019-08-31 NOTE — Telephone Encounter (Signed)
Pt has been notified of lab results by phone with verbal understanding. Pt c/o he is still having sob more so when walking, still has swelling more so in his belly, face, hands, arms, not in his ankles. Pt c/o he feels his weight is up but he did not have any readings, though states he can tell by his clothes feel tighter. I did advise pt to be sure to limit Na intake. Pt states he has actually decreased his fluid intake as well. Pt states he sleeps with 2 pillows and he notices he is wheezing at night. Pt states he would like to have an appt with Dr. [name] to discuss his medications . Said he was on an inhaler recently though states he had a reaction from it. Pt c/o fatigue as well as says he just wants to feel better like he did a couple of months ago. I assured the pt I will let Dr. [name] and his nurse Marveen Reeks RN know of our conversation today.  The patient has been notified of the result and verbalized understanding.  All questions (if any) were answered. Julaine Hua, [place] 08/31/2019 3:03 PM

## 2019-08-31 NOTE — Telephone Encounter (Signed)
-----   Message from Belva Crome, MD sent at 08/31/2019  2:35 PM EDT ----- Let the patient know the labs look good. Is breathing and swelling any better? A copy will be sent to [name], MD

## 2019-09-01 NOTE — Telephone Encounter (Signed)
Please have patient seen in the advaced heart failure clinic to see if additional therapies or changes will help.

## 2019-09-03 ENCOUNTER — Ambulatory Visit (INDEPENDENT_AMBULATORY_CARE_PROVIDER_SITE_OTHER): Payer: Managed Care, Other (non HMO)

## 2019-09-03 DIAGNOSIS — I5042 Chronic combined systolic (congestive) and diastolic (congestive) heart failure: Secondary | ICD-10-CM

## 2019-09-03 DIAGNOSIS — Z950 Presence of cardiac pacemaker: Secondary | ICD-10-CM | POA: Diagnosis not present

## 2019-09-04 NOTE — Telephone Encounter (Signed)
Spoke with pt and made him aware of Dr. Thompson Caul recommendation.  Advised I will place a referral to HF clinic and they will contact him to schedule.  Pt verbalized understanding and was appreciative for cal.

## 2019-09-05 ENCOUNTER — Telehealth (HOSPITAL_COMMUNITY): Payer: Self-pay | Admitting: Vascular Surgery

## 2019-09-05 NOTE — Progress Notes (Signed)
EPIC Encounter for ICM Monitoring  Patient Name: Nicholas Foley is a 68 y.o. male Date: 09/05/2019 Primary Care Physican: [name], MD Primary Cardiologist:Smith Electrophysiologist:Allred Bi-V Pacing:97.6% 07/20/2019 Weight: 325 lbs (office visit)  Event Summary ?    11 V. Sensing Episodes ?    1 VT-NS ?    78 seconds in AT/AF Since Last Session  Transmission reviewed.   Optivolthoracic impedancenormal  Prescribed: Furosemide40 mg take 80 mg twice a day. Potassium 20 mEq take 1 tablet twice a day.   Labs: 08/30/2019 Creatinine 1.16, BUN 21, Potassium 4.4, Sodium 140, GFR 65-75 07/31/2019 Creatinine 1.31, BUN 20, Potassium 4.6, Sodium 141, GFR 56-65  A complete set of results can be found in Results Review.  Recommendations:  None  Follow-up plan: ICM clinic phone appointment on 10/08/2019.   91 day device clinic remote transmission 10/17/2019.   Copy of ICM check sent to Dr. [name].   3 month ICM trend: 09/03/2019    1 Year ICM trend:       Rosalene Billings, RN 09/05/2019 11:13 AM

## 2019-09-05 NOTE — Telephone Encounter (Signed)
Left pt message giving NEW PT appt w/ DB 10/22 @ 940 asked pt to call back to confirm appt

## 2019-09-12 ENCOUNTER — Other Ambulatory Visit: Payer: Self-pay | Admitting: Interventional Cardiology

## 2019-09-13 ENCOUNTER — Ambulatory Visit (HOSPITAL_COMMUNITY)
Admission: RE | Admit: 2019-09-13 | Discharge: 2019-09-13 | Disposition: A | Discharge status: Home / Self Care | Payer: Managed Care, Other (non HMO) | Source: Ambulatory Visit | Attending: Internal Medicine | Admitting: Internal Medicine

## 2019-09-13 ENCOUNTER — Encounter (HOSPITAL_COMMUNITY): Payer: Self-pay | Admitting: Internal Medicine

## 2019-09-13 ENCOUNTER — Other Ambulatory Visit: Payer: Self-pay

## 2019-09-13 VITALS — BP 133/86 | HR 89 | Wt 327.0 lb

## 2019-09-13 DIAGNOSIS — J449 Chronic obstructive pulmonary disease, unspecified: Secondary | ICD-10-CM | POA: Diagnosis not present

## 2019-09-13 DIAGNOSIS — Z8249 Family history of ischemic heart disease and other diseases of the circulatory system: Secondary | ICD-10-CM | POA: Insufficient documentation

## 2019-09-13 DIAGNOSIS — Z79899 Other long term (current) drug therapy: Secondary | ICD-10-CM | POA: Insufficient documentation

## 2019-09-13 DIAGNOSIS — Z6841 Body Mass Index (BMI) 40.0 and over, adult: Secondary | ICD-10-CM | POA: Diagnosis not present

## 2019-09-13 DIAGNOSIS — I447 Left bundle-branch block, unspecified: Secondary | ICD-10-CM | POA: Diagnosis not present

## 2019-09-13 DIAGNOSIS — Z7951 Long term (current) use of inhaled steroids: Secondary | ICD-10-CM | POA: Diagnosis not present

## 2019-09-13 DIAGNOSIS — I428 Other cardiomyopathies: Secondary | ICD-10-CM | POA: Insufficient documentation

## 2019-09-13 DIAGNOSIS — Z7901 Long term (current) use of anticoagulants: Secondary | ICD-10-CM | POA: Diagnosis not present

## 2019-09-13 DIAGNOSIS — Z95810 Presence of automatic (implantable) cardiac defibrillator: Secondary | ICD-10-CM | POA: Insufficient documentation

## 2019-09-13 DIAGNOSIS — I480 Paroxysmal atrial fibrillation: Secondary | ICD-10-CM | POA: Diagnosis not present

## 2019-09-13 DIAGNOSIS — I493 Ventricular premature depolarization: Secondary | ICD-10-CM | POA: Diagnosis not present

## 2019-09-13 DIAGNOSIS — I5022 Chronic systolic (congestive) heart failure: Secondary | ICD-10-CM | POA: Diagnosis present

## 2019-09-13 DIAGNOSIS — I110 Hypertensive heart disease with heart failure: Secondary | ICD-10-CM | POA: Diagnosis not present

## 2019-09-13 DIAGNOSIS — Z825 Family history of asthma and other chronic lower respiratory diseases: Secondary | ICD-10-CM | POA: Diagnosis not present

## 2019-09-13 DIAGNOSIS — G4733 Obstructive sleep apnea (adult) (pediatric): Secondary | ICD-10-CM | POA: Diagnosis not present

## 2019-09-13 DIAGNOSIS — I5042 Chronic combined systolic (congestive) and diastolic (congestive) heart failure: Secondary | ICD-10-CM | POA: Diagnosis not present

## 2019-09-13 DIAGNOSIS — N529 Male erectile dysfunction, unspecified: Secondary | ICD-10-CM | POA: Insufficient documentation

## 2019-09-13 DIAGNOSIS — Z87891 Personal history of nicotine dependence: Secondary | ICD-10-CM | POA: Diagnosis not present

## 2019-09-13 NOTE — Patient Instructions (Signed)
No labs done today.  No medication changes were made today. Please continue all current medications as prescribed.  Your physician recommends that you schedule a follow-up appointment in: 6 weeks with Dr. Haroldine Laws  Your physician has requested that you have an echocardiogram. Echocardiography is a painless test that uses sound waves to create images of your heart. It provides your doctor with information about the size and shape of your heart and how well your heart's chambers and valves are working. This procedure takes approximately one hour. There are no restrictions for this procedure.  Your physician has recommended that you have a cardiopulmonary stress test (CPX). CPX testing is a non-invasive measurement of heart and lung function. It replaces a traditional treadmill stress test. This type of test provides a tremendous amount of information that relates not only to your present condition but also for future outcomes. This test combines measurements of you ventilation, respiratory gas exchange in the lungs, electrocardiogram (EKG), blood pressure and physical response before, during, and following an exercise protocol. We will contact you to schedule this appointment.   At the [facility], you and your health needs are our priority. As part of our continuing mission to provide you with exceptional heart care, we have created designated Provider Care Teams. These Care Teams include your primary Cardiologist (physician) and Advanced Practice Providers (APPs- Physician Assistants and Nurse Practitioners) who all work together to provide you with the care you need, when you need it.   You may see any of the following providers on your designated Care Team at your next follow up: [name] Dr Glori Bickers . Dr Loralie Champagne . Darrick Grinder, NP . Lyda Jester, PA   Please be sure to bring in all your medications bottles to every appointment.

## 2019-09-13 NOTE — Progress Notes (Signed)
ADVANCED HF CLINIC CONSULT NOTE  Referring Physician: Dr Nicholas Foley  Primary Care: Dr Nicholas Foley Primary Cardiologist: Dr Nicholas Foley  Pulmonary:  EP: Dr Nicholas Foley   HPI: [name] is a 68 year old with a history of morbid obesity, chronic systolic heart failure, COPD, OSA, PAF, previously on amio-> stopped 2018,  HTN, LBBB, Medtronic CRT-P, and former smoker (quit 7-8 years ago). Referred by Dr. Tamala Foley for further evaluation of his HF.   He has h/o NICM which seems to dat back to 2014. Had cardiac cath in 1/20 with no CAD and well compensated filling pressures.   Admitted in January 2020 for CRT-P device in setting of LBBB. Intially he felt a lot better but has gradually declined. Over the last month he has been having ongoing episodes with dyspnea and fatigue. Saw Dr. Tamala Foley who referred him here for further evaluation.   Complaining of fatigue. SOB with exertion. Denies PND. Denies LE edema but says he gets bloated in his abdomen. No chest pain. No bleeding issues.  Uses CPAP every night. Weight at home 320-325 pounds. Not exercising. Works full time in [place]. Over the last 3 years he has gained 40 pounds.   Optivol - CRT-P. 100% biv pacing Activity 2-6 hours per day. Fluid index low. Impedance up.  Personally reviewed   Cardiac Test [facility] 11/2018 No coronary disease.  RA 13 PA 37/20 (27)  PCWP 14 CO 8.5 CI 3.3   ECHO 09/2018  EF 35-40% Grade II DD     Review of Systems: [y] = yes, [ ]  = no   General: Weight gain [Y ]; Weight loss [ ] ; Anorexia [ ] ; Fatigue [Y]; Fever [ ] ; Chills [ ] ; Weakness [Y ]  Cardiac: Chest pain/pressure [ ] ; Resting SOB [ ] ; Exertional SOB [ Y]; Orthopnea [ ] ; Pedal Edema [Y ]; Palpitations [ ] ; Syncope [ ] ; Presyncope [ ] ; Paroxysmal nocturnal dyspnea[ ]   Pulmonary: Cough [ ] ; Wheezing[ ] ; Hemoptysis[ ] ; Sputum [ ] ; Snoring [ ]   GI: Vomiting[ ] ; Dysphagia[ ] ; Melena[ ] ; Hematochezia [ ] ; Heartburn[ ] ; Abdominal pain [ ] ; Constipation [ ] ; Diarrhea  [ ] ; BRBPR [ ]   GU: Hematuria[ ] ; Dysuria [ ] ; Nocturia[ ]   Vascular: Pain in legs with walking [ ] ; Pain in feet with lying flat [ ] ; Non-healing sores [ ] ; Stroke [ ] ; TIA [ ] ; Slurred speech [ ] ;  Neuro: Headaches[ ] ; Vertigo[ ] ; Seizures[ ] ; Paresthesias[ ] ;Blurred vision [ ] ; Diplopia [ ] ; Vision changes [ ]   Ortho/Skin: Arthritis [ ] ; Joint pain [ Y]; Muscle pain [ ] ; Joint swelling [ ] ; Back Pain [ ] ; Rash [ ]   Psych: Depression[ ] ; Anxiety[ ]   Heme: Bleeding problems [ ] ; Clotting disorders [ ] ; Anemia [ ]   Endocrine: Diabetes [ ] ; Thyroid dysfunction[ ]    Past Medical History:  Diagnosis Date  . Atrial fibrillation with rapid ventricular response ([place]) 08/02/2013  . CHF (congestive heart failure) ([place])   . Chronic systolic dysfunction of left ventricle 08/02/2013   BNP greater than 2000   . COPD (chronic obstructive pulmonary disease) ([facility])    Moderate by PFTs with response to bronchodilators, May 2014  . Erectile dysfunction   . Hypertension   . Insomnia 08/02/13  . Left bundle branch block 08/02/2013  . Morbid obesity ([place]) 08/02/2013  . OSA (obstructive sleep apnea)    severe OSA with AHI 74/hr now on CPAP at 8cm H2O    Current Outpatient Medications  Medication  Sig Dispense Refill  . acetaminophen (TYLENOL) 650 MG CR tablet Take 1,300 mg by mouth every 8 (eight) hours as needed for pain.     [name] apixaban (ELIQUIS) 5 MG TABS tablet Take 1 tablet (5 mg total) by mouth 2 (two) times daily. 180 tablet 3  . carvedilol (COREG) 6.25 MG tablet TAKE 1 TABLET(6.25 MG) BY MOUTH TWICE DAILY 60 tablet 9  . cholecalciferol (VITAMIN D3) 25 MCG (1000 UT) tablet Take 1,000 Units by mouth daily.    [name] CIALIS 20 MG tablet Take 20 mg by mouth daily as needed for erectile dysfunction.   0  . cyclobenzaprine (FLEXERIL) 10 MG tablet Take 10 mg by mouth as needed.    [name] ENTRESTO 49-51 MG TAKE 1 TABLET BY MOUTH TWICE DAILY 180 tablet 3  . fluticasone (FLONASE) 50 MCG/ACT nasal spray Place 1 spray  into both nostrils 2 (two) times daily.   0  . Fluticasone-Salmeterol (ADVAIR) 250-50 MCG/DOSE AEPB Inhale 1 puff into the lungs 2 (two) times daily.     . furosemide (LASIX) 40 MG tablet TAKE 2 TABLETS(80 MG) BY MOUTH TWICE DAILY 360 tablet 3  . Multiple Vitamins-Minerals (PRESERVISION AREDS 2 PO) Take 1 tablet by mouth 2 (two) times daily.     [name] omeprazole (PRILOSEC OTC) 20 MG tablet Take 20 mg by mouth daily.    . potassium chloride SA (K-DUR) 20 MEQ tablet Take 1 tablet (20 mEq total) by mouth daily. 90 tablet 3  . spironolactone (ALDACTONE) 25 MG tablet Take 0.5 tablets (12.5 mg total) by mouth daily. 45 tablet 3  . umeclidinium-vilanterol (ANORO ELLIPTA) 62.5-25 MCG/INH AEPB Inhale 1 puff into the lungs daily. 1 each 0  . vitamin B-12 (CYANOCOBALAMIN) 1000 MCG tablet Take 1,000 mcg by mouth daily.    [name] zolpidem (AMBIEN CR) 12.5 MG CR tablet Take 1 tablet by mouth at bedtime as needed for sleep.      No current facility-administered medications for this encounter.     No Known Allergies    Social History   Socioeconomic History  . Marital status: Married    Spouse name: Not on file  . Number of children: Not on file  . Years of education: Not on file  . Highest education level: Not on file  Occupational History  . Occupation: real estate    Comment: investment  Social Needs  . Financial resource strain: Not on file  . Food insecurity    Worry: Not on file    Inability: Not on file  . Transportation needs    Medical: Not on file    Non-medical: Not on file  Tobacco Use  . Smoking status: Former Smoker    Packs/day: 1.50    Years: 43.00    Pack years: 64.50    Types: Cigarettes, Cigars    Quit date: 08/02/2012    Years since quitting: 7.1  . Smokeless tobacco: Never Used  Substance and Sexual Activity  . Alcohol use: Yes    Alcohol/week: 0.0 standard drinks    Comment: once a week; previously 3-4 times per week   . Drug use: No  . Sexual activity: Yes  Lifestyle   . Physical activity    Days per week: Not on file    Minutes per session: Not on file  . Stress: Not on file  Relationships  . Social Herbalist on phone: Not on file    Gets together: Not on file    Attends  religious service: Not on file    Active member of club or organization: Not on file    Attends meetings of clubs or organizations: Not on file    Relationship status: Not on file  . Intimate partner violence    Fear of current or ex partner: Not on file    Emotionally abused: Not on file    Physically abused: Not on file    Forced sexual activity: Not on file  Other Topics Concern  . Not on file  Social History Narrative   Works as a Engineer, structural for apartments   Lives with wife.  They have one grown daughter   Highest level of education:  college      Family History  Problem Relation Age of Onset  . Atrial fibrillation Mother   . COPD Father   . Cerebral aneurysm Brother   . Healthy Daughter     Vitals:   09/13/19 0941  BP: 133/86  Pulse: 89  SpO2: 97%  Weight: (!) 148.3 kg (327 lb)   Wt Readings from Last 3 Encounters:  09/13/19 (!) 148.3 kg (327 lb)  07/20/19 (!) 147.4 kg (325 lb)  07/09/19 (!) 147.8 kg (325 lb 12.8 oz)     PHYSICAL EXAM: General:  Well appearing. No respiratory difficulty HEENT: normal anicteric Neck: supple. no JVD. Carotids 2+ bilat; no bruits. No lymphadenopathy or thryomegaly appreciated. Cor: PMI nondisplaced. Regular rate & rhythm. No rubs, gallops or murmurs. Lungs: clear no wheeze Abdomen: markedly obese soft, nontender, nondistended. No hepatosplenomegaly. No bruits or masses. Good bowel sounds. Extremities: no cyanosis, clubbing, rash, edema Neuro: alert & oriented x 3, cranial nerves grossly intact. moves all 4 extremities w/o difficulty. Affect pleasant.  ECG:  A sensed V Paced 89 bpm QRS  96 ms    ASSESSMENT & PLAN:  1. Chronic Combined Systolic/Diastolic HF NICM. 12/2018 LHC no coronary disease.   - ECHO 11/2017 EF 35-40%. Medtronic CRT-P NYHA IIIB.  Due to progressive fatigue/dyspnea. Check CPX test.   Consider RHC if worse.  -Volume status ok. Continue lasix 80 mg twice a day. Optivol was ok.  - Continue carvedilol 6.25 mg twice a day - Continue entresto 49-51 mg twice a day  - Continue spiro 25 mg daily.  - Repeat ECHO   2. PAF  SR today In the past he was on amiodarone.  Continue eliquis 5 mg twice a day   3. OSA Continue CPAP   4. Obesity Body mass index is 45.61 kg/m.   5. COPD   Set up ECHO next week and CPX 2 weeks.  Follow up in 4-6 weeks with Dr Nicholas Foley   Nicholas Clegg NP-C  Agree with above. 68 y/o morbidly obese male with severe OSA and systolic HF due to NICM presumed LBBB-related. Underwent CRT-P earlier this year and initially felt much better but now having recurrent NYHA III symptoms. ECG shows NSR with marked improvement in QRSd (134ms -> 10ms). On exam no s3 or volume overload. ICD interrogated personally. Volume ok. 100% biv pacing. Excellent activity level (3-4h/day). Compliant with CPAP.  Symptomatic complaints seem out of proportion to objective data.   Will repeat echo and proceed with CPX testing to further assess. I am concerned that his obesity and recent weight gain may be playing a role here.   Nicholas Bickers, MD  4:05 PM

## 2019-09-14 ENCOUNTER — Other Ambulatory Visit (HOSPITAL_COMMUNITY)
Admission: RE | Admit: 2019-09-14 | Discharge: 2019-09-14 | Disposition: A | Discharge status: Home / Self Care | Payer: Medicare Other | Source: Ambulatory Visit | Attending: Internal Medicine | Admitting: Internal Medicine

## 2019-09-14 DIAGNOSIS — Z01812 Encounter for preprocedural laboratory examination: Secondary | ICD-10-CM | POA: Diagnosis present

## 2019-09-14 DIAGNOSIS — Z20828 Contact with and (suspected) exposure to other viral communicable diseases: Secondary | ICD-10-CM | POA: Insufficient documentation

## 2019-09-15 LAB — NOVEL CORONAVIRUS, NAA (HOSP ORDER, SEND-OUT TO REF LAB; TAT 18-24 HRS): SARS-CoV-2, NAA: NOT DETECTED

## 2019-09-18 ENCOUNTER — Other Ambulatory Visit: Payer: Self-pay

## 2019-09-18 ENCOUNTER — Ambulatory Visit (HOSPITAL_COMMUNITY): Payer: Medicare Other | Attending: Internal Medicine

## 2019-09-18 DIAGNOSIS — I5042 Chronic combined systolic (congestive) and diastolic (congestive) heart failure: Secondary | ICD-10-CM | POA: Insufficient documentation

## 2019-09-27 ENCOUNTER — Ambulatory Visit (HOSPITAL_BASED_OUTPATIENT_CLINIC_OR_DEPARTMENT_OTHER)
Admission: RE | Admit: 2019-09-27 | Discharge: 2019-09-27 | Disposition: A | Discharge status: Home / Self Care | Payer: BC Managed Care – PPO | Source: Ambulatory Visit | Attending: Internal Medicine | Admitting: Internal Medicine

## 2019-09-27 ENCOUNTER — Ambulatory Visit (HOSPITAL_COMMUNITY)
Admission: RE | Admit: 2019-09-27 | Discharge: 2019-09-27 | Disposition: A | Discharge status: Home / Self Care | Payer: BC Managed Care – PPO | Source: Ambulatory Visit | Attending: Internal Medicine | Admitting: Internal Medicine

## 2019-09-27 ENCOUNTER — Encounter (HOSPITAL_COMMUNITY): Payer: Self-pay

## 2019-09-27 ENCOUNTER — Other Ambulatory Visit: Payer: Self-pay

## 2019-09-27 VITALS — BP 134/88 | HR 82 | Wt 327.6 lb

## 2019-09-27 DIAGNOSIS — Z6841 Body Mass Index (BMI) 40.0 and over, adult: Secondary | ICD-10-CM | POA: Insufficient documentation

## 2019-09-27 DIAGNOSIS — I480 Paroxysmal atrial fibrillation: Secondary | ICD-10-CM | POA: Diagnosis not present

## 2019-09-27 DIAGNOSIS — I110 Hypertensive heart disease with heart failure: Secondary | ICD-10-CM | POA: Diagnosis not present

## 2019-09-27 DIAGNOSIS — I5042 Chronic combined systolic (congestive) and diastolic (congestive) heart failure: Secondary | ICD-10-CM

## 2019-09-27 DIAGNOSIS — E669 Obesity, unspecified: Secondary | ICD-10-CM | POA: Diagnosis not present

## 2019-09-27 DIAGNOSIS — G4733 Obstructive sleep apnea (adult) (pediatric): Secondary | ICD-10-CM

## 2019-09-27 DIAGNOSIS — J449 Chronic obstructive pulmonary disease, unspecified: Secondary | ICD-10-CM | POA: Diagnosis not present

## 2019-09-27 DIAGNOSIS — I428 Other cardiomyopathies: Secondary | ICD-10-CM | POA: Diagnosis not present

## 2019-09-27 DIAGNOSIS — Z7901 Long term (current) use of anticoagulants: Secondary | ICD-10-CM | POA: Insufficient documentation

## 2019-09-27 DIAGNOSIS — Z87891 Personal history of nicotine dependence: Secondary | ICD-10-CM | POA: Insufficient documentation

## 2019-09-27 MED ORDER — PERFLUTREN LIPID MICROSPHERE
1.0000 mL | INTRAVENOUS | Status: DC | PRN
  Administered 2019-09-27: 5 mL via INTRAVENOUS
  Filled 2019-09-27: qty 10

## 2019-09-27 MED ORDER — SACUBITRIL-VALSARTAN 97-103 MG PO TABS: 1.0000 | ORAL_TABLET | Freq: Two times a day (BID) | ORAL | 6 refills | Status: DC

## 2019-09-27 NOTE — Progress Notes (Signed)
  Echocardiogram 2D Echocardiogram has been performed with [facility].  Nicholas Foley 09/27/2019, 12:05 PM

## 2019-09-27 NOTE — Patient Instructions (Signed)
INCREASE Entresto to 97/103mg   (1 tab) twice a day  Labs in 1 week We will only contact you if something comes back abnormal or we need to make some changes. Otherwise no news is good news!  Your physician recommends that you schedule a follow-up appointment in: Keep next follow up with Dr Aundra Dubin  At the [facility], you and your health needs are our priority. As part of our continuing mission to provide you with exceptional heart care, we have created designated Provider Care Teams. These Care Teams include your primary Cardiologist (physician) and Advanced Practice Providers (APPs- Physician Assistants and Nurse Practitioners) who all work together to provide you with the care you need, when you need it.   You may see any of the following providers on your designated Care Team at your next follow up: [name] Dr Glori Bickers . Dr Loralie Champagne . Darrick Grinder, NP . Lyda Jester, PA   Please be sure to bring in all your medications bottles to every appointment.

## 2019-09-27 NOTE — Progress Notes (Signed)
ADVANCED HF CLINIC NOTE  Referring Physician: Dr Nicholas Foley  Primary Care: Dr Nicholas Foley Primary Cardiologist: Dr Nicholas Foley  Pulmonary:  EP: Dr Nicholas Foley   HPI: Nicholas Foley is a 68 year old with a history of morbid obesity, chronic systolic heart failure, COPD, OSA, PAF, previously on amio-> stopped 2018,  HTN, LBBB, Medtronic CRT-P, and former smoker (quit 7-8 years ago).   He has h/o NICM which seems to dat back to 2014. Had cardiac cath in 1/20 with no CAD and well compensated filling pressures.   Admitted in January 2020 for CRT-P device in setting of LBBB. Intially he felt a lot better but has gradually declined. Over the last month he has been having ongoing episodes with dyspnea and fatigue. Saw Dr. Tamala Foley who referred him here for further evaluation.   Today he returns for HF follow up.Overall feeling fair. Having ongoing fatigue. SOB with exertion. Denies PND/Orthopnea. Not exercising. Appetite ok. No fever or chills. Says he has gained about 100 pounds over the last 6 years. Weight at home 320-325 pounds. Using CPAP every night. Taking all medications. Working full time in [place].   Optivol - Impedance normal. Activity 2-4 hours per day. No VT.   Cardiac Test CPX 09/18/19  Limited by obesity and mild heart failure Peak VO2 15.1  (80% predicted peak VO2)  [place] [address] RER 1.02   LHC/RHC 11/2018 No coronary disease.  RA 13 PA 37/20 (27)  PCWP 14 CO 8.5 CI 3.3   ECHO 09/27/2019 -RV normal EF 45-50%   ECHO 09/2018 -EF 35-40% Grade II DD      Past Medical History:  Diagnosis Date  . Atrial fibrillation with rapid ventricular response ([place]) 08/02/2013  . CHF (congestive heart failure) ([place] [facility])   . Chronic systolic dysfunction of left ventricle 08/02/2013   BNP greater than 2000   . COPD (chronic obstructive pulmonary disease) ([facility])    Moderate by PFTs with response to bronchodilators, May 2014  . Erectile dysfunction   . Hypertension   . Insomnia 08/02/13  .  Left bundle branch block 08/02/2013  . Morbid obesity ([place] [place]) 08/02/2013  . OSA (obstructive sleep apnea)    severe OSA with AHI 74/hr now on CPAP at 8cm H2O    Current Outpatient Medications  Medication Sig Dispense Refill  . acetaminophen (TYLENOL) 650 MG CR tablet Take 1,300 mg by mouth every 8 (eight) hours as needed for pain.     [name] apixaban (ELIQUIS) 5 MG TABS tablet Take 1 tablet (5 mg total) by mouth 2 (two) times daily. 180 tablet 3  . carvedilol (COREG) 6.25 MG tablet TAKE 1 TABLET(6.25 MG) BY MOUTH TWICE DAILY 60 tablet 9  . cholecalciferol (VITAMIN D3) 25 MCG (1000 UT) tablet Take 1,000 Units by mouth daily.    [name] CIALIS 20 MG tablet Take 20 mg by mouth daily as needed for erectile dysfunction.   0  . cyclobenzaprine (FLEXERIL) 10 MG tablet Take 10 mg by mouth as needed.    [name] ENTRESTO 49-51 MG TAKE 1 TABLET BY MOUTH TWICE DAILY 180 tablet 3  . fluticasone (FLONASE) 50 MCG/ACT nasal spray Place 1 spray into both nostrils 2 (two) times daily.   0  . Fluticasone-Salmeterol (ADVAIR) 250-50 MCG/DOSE AEPB Inhale 1 puff into the lungs 2 (two) times daily.     . furosemide (LASIX) 40 MG tablet TAKE 2 TABLETS(80 MG) BY MOUTH TWICE DAILY 360 tablet 3  . omeprazole (PRILOSEC OTC) 20  MG tablet Take 20 mg by mouth daily.    . potassium chloride SA (K-DUR) 20 MEQ tablet Take 1 tablet (20 mEq total) by mouth daily. 90 tablet 3  . spironolactone (ALDACTONE) 25 MG tablet Take 0.5 tablets (12.5 mg total) by mouth daily. 45 tablet 3  . umeclidinium-vilanterol (ANORO ELLIPTA) 62.5-25 MCG/INH AEPB Inhale 1 puff into the lungs daily. 1 each 0  . vitamin B-12 (CYANOCOBALAMIN) 1000 MCG tablet Take 1,000 mcg by mouth daily.    [name] zolpidem (AMBIEN CR) 12.5 MG CR tablet Take 1 tablet by mouth at bedtime as needed for sleep.      No current facility-administered medications for this encounter.    Facility-Administered Medications Ordered in Other Encounters  Medication Dose Route Frequency Provider Last Rate  Last Dose  . perflutren lipid microspheres (DEFINITY) IV suspension  1-10 mL Intravenous PRN Foley, Nicholas Pascal, MD   5 mL at 09/27/19 1207    No Known Allergies    Social History   Socioeconomic History  . Marital status: Married    Spouse name: Not on file  . Number of children: Not on file  . Years of education: Not on file  . Highest education level: Not on file  Occupational History  . Occupation: real estate    Comment: investment  Social Needs  . Financial resource strain: Not on file  . Food insecurity    Worry: Not on file    Inability: Not on file  . Transportation needs    Medical: Not on file    Non-medical: Not on file  Tobacco Use  . Smoking status: Former Smoker    Packs/day: 1.50    Years: 43.00    Pack years: 64.50    Types: Cigarettes, Cigars    Quit date: 08/02/2012    Years since quitting: 7.1  . Smokeless tobacco: Never Used  Substance and Sexual Activity  . Alcohol use: Yes    Alcohol/week: 0.0 standard drinks    Comment: once a week; previously 3-4 times per week   . Drug use: No  . Sexual activity: Yes  Lifestyle  . Physical activity    Days per week: Not on file    Minutes per session: Not on file  . Stress: Not on file  Relationships  . Social Herbalist on phone: Not on file    Gets together: Not on file    Attends religious service: Not on file    Active member of club or organization: Not on file    Attends meetings of clubs or organizations: Not on file    Relationship status: Not on file  . Intimate partner violence    Fear of current or ex partner: Not on file    Emotionally abused: Not on file    Physically abused: Not on file    Forced sexual activity: Not on file  Other Topics Concern  . Not on file  Social History Narrative   Works as a Engineer, structural for apartments   Lives with wife.  They have one grown daughter   Highest level of education:  college      Family History  Problem Relation Age of  Onset  . Atrial fibrillation Mother   . COPD Father   . Cerebral aneurysm Brother   . Healthy Daughter     Vitals:   09/27/19 1338  BP: 134/88  Pulse: 82  SpO2: 96%  Weight: (!) 148.6 kg (327 lb  9.6 oz)   Wt Readings from Last 3 Encounters:  09/27/19 (!) 148.6 kg (327 lb 9.6 oz)  09/13/19 (!) 148.3 kg (327 lb)  07/20/19 (!) 147.4 kg (325 lb)     PHYSICAL EXAM: General:  Well appearing. No resp difficulty HEENT: normal Neck: supple. no JVD. Carotids 2+ bilat; no bruits. No lymphadenopathy or thryomegaly appreciated. Cor: PMI nondisplaced. Regular rate & rhythm. No rubs, gallops or murmurs. Lungs: clear Abdomen: obese, soft, nontender, nondistended. No hepatosplenomegaly. No bruits or masses. Good bowel sounds. Extremities: no cyanosis, clubbing, rash, R and LLE trace edema Neuro: alert & orientedx3, cranial nerves grossly intact. moves all 4 extremities w/o difficulty. Affect pleasant     ASSESSMENT & PLAN: 1. Chronic Combined Systolic/Diastolic HF NICM. 12/2018 LHC no coronary disease.  - ECHO 11/2017 EF 35-40%. ECHO today EF 45-50%. Discussed at office visit.  - CPX test suboptimal effort- mild HF symptoms.  - Medtronic CRT-P NYHA IIIB.  Consider RHC if worse.  -Volume status ok. Continue lasix 80 mg twice a day. Optivol was ok.  - Continue carvedilol 6.25 mg twice a day - Increase entresto to 97-103 mg twice a day. Check BMET in 7 days.  - Continue spiro 25 mg daily.   2. PAF  In the past he was on amiodarone.  Regular on exam. Continue eliquis 5 mg twice a day   3. OSA Continue CPAP   4. Obesity Body mass index is 45.69 kg/m.  Discussed portion control. Discussed weight loss. Encouraged to start walking every day.   5. COPD   Follow up with Dr Haroldine Laws next month. Discuss CPX/ECHO results today.  Check BMET in 7 days.    Nicholas Seder NP-C

## 2019-10-01 ENCOUNTER — Other Ambulatory Visit: Payer: Self-pay | Admitting: Pharmacist

## 2019-10-01 MED ORDER — APIXABAN 5 MG PO TABS: 5.0000 mg | ORAL_TABLET | Freq: Two times a day (BID) | ORAL | 1 refills | Status: DC

## 2019-10-01 NOTE — Progress Notes (Signed)
Age 68, weight 148kg, SCr 1.16 on 08/30/19, afib indication, last OV August 2020

## 2019-10-04 ENCOUNTER — Ambulatory Visit (HOSPITAL_COMMUNITY)
Admission: RE | Admit: 2019-10-04 | Discharge: 2019-10-04 | Disposition: A | Discharge status: Home / Self Care | Payer: BC Managed Care – PPO | Source: Ambulatory Visit | Attending: Internal Medicine | Admitting: Internal Medicine

## 2019-10-04 ENCOUNTER — Other Ambulatory Visit: Payer: Self-pay

## 2019-10-04 DIAGNOSIS — I5042 Chronic combined systolic (congestive) and diastolic (congestive) heart failure: Secondary | ICD-10-CM | POA: Diagnosis not present

## 2019-10-04 LAB — BASIC METABOLIC PANEL
Anion gap: 10 (ref 5–15)
BUN: 16 mg/dL (ref 8–23)
CO2: 26 mmol/L (ref 22–32)
Calcium: 8.7 mg/dL — ABNORMAL LOW (ref 8.9–10.3)
Chloride: 103 mmol/L (ref 98–111)
Creatinine, Ser: 1.23 mg/dL (ref 0.61–1.24)
GFR calc Af Amer: >60 mL/min (ref >60)
GFR calc non Af Amer: >60 mL/min (ref >60)
Glucose, Bld: 112 mg/dL — ABNORMAL HIGH (ref 70–99)
Potassium: 3.6 mmol/L (ref 3.5–5.1)
Sodium: 139 mmol/L (ref 135–145)

## 2019-10-08 ENCOUNTER — Ambulatory Visit (INDEPENDENT_AMBULATORY_CARE_PROVIDER_SITE_OTHER): Payer: BC Managed Care – PPO

## 2019-10-08 DIAGNOSIS — Z950 Presence of cardiac pacemaker: Secondary | ICD-10-CM

## 2019-10-08 DIAGNOSIS — I5042 Chronic combined systolic (congestive) and diastolic (congestive) heart failure: Secondary | ICD-10-CM

## 2019-10-08 NOTE — Progress Notes (Signed)
EPIC Encounter for ICM Monitoring  Patient Name: Nicholas Foley is a 68 y.o. male Date: 10/08/2019 Primary Care Physican: [name], MD Primary Cardiologist:Smith/Bensimhon Electrophysiologist:Allred Bi-V Pacing:96.8% 09/27/2019 Weight: 327 lbs (office visit)   Attempted call to patient and unable to reach.  Left message to return call. Transmission reviewed.   Optivolthoracic impedancesuggestive of possible fluid accumulation since 10/07/2019.  Prescribed: Furosemide40 mg take 80 mg twice a day. Potassium 20 mEq take 1 tablet twice a day.   Labs: 08/30/2019 Creatinine 1.16, BUN 21, Potassium 4.4, Sodium 140, GFR 65-75 07/31/2019 Creatinine 1.31, BUN 20, Potassium 4.6, Sodium 141, GFR 56-65  A complete set of results can be found in Results Review.  Recommendations: Unable to reach.    Follow-up plan: ICM clinic phone appointment on 10/17/2019 to recheck fluid levels.   91 day device clinic remote transmission 10/17/2019.  Office visit on 10/26/2019 with Dr Haroldine Laws.  Copy of ICM check sent to Dr. [name] and Dr Haroldine Laws.   3 month ICM trend: 10/08/2019    1 Year ICM trend:       Rosalene Billings, RN 10/08/2019 1:39 PM

## 2019-10-09 DIAGNOSIS — G4733 Obstructive sleep apnea (adult) (pediatric): Secondary | ICD-10-CM | POA: Diagnosis not present

## 2019-10-09 NOTE — Progress Notes (Signed)
Patient returned call.  He said he did not feel well yesterday and thought he was retaining fluid.  He has had increased urination today and feeling better.  No changes and next ICM remote transmission scheduled for 11/12/2019. Encouraged to call for any fluid symptoms.

## 2019-10-10 ENCOUNTER — Other Ambulatory Visit: Payer: Self-pay | Admitting: Internal Medicine

## 2019-10-17 ENCOUNTER — Ambulatory Visit (INDEPENDENT_AMBULATORY_CARE_PROVIDER_SITE_OTHER): Payer: BC Managed Care – PPO | Admitting: *Deleted

## 2019-10-17 DIAGNOSIS — I5042 Chronic combined systolic (congestive) and diastolic (congestive) heart failure: Secondary | ICD-10-CM | POA: Diagnosis not present

## 2019-10-17 LAB — CUP PACEART REMOTE DEVICE CHECK
Battery Remaining Longevity: 152 mo
Battery Voltage: 3.06 V
Brady Statistic AP VP Percent: 0.09 %
Brady Statistic AP VS Percent: 0.02 %
Brady Statistic AS VP Percent: 96.92 %
Brady Statistic AS VS Percent: 2.97 %
Brady Statistic RA Percent Paced: 0.12 %
Brady Statistic RV Percent Paced: 10.18 %
Date Time Interrogation Session: 20201124193923
Implantable Lead Implant Date: 20200123
Implantable Lead Implant Date: 20200123
Implantable Lead Implant Date: 20200123
Implantable Lead Location: 753858
Implantable Lead Location: 753859
Implantable Lead Location: 753860
Implantable Lead Model: 4598
Implantable Lead Model: 5076
Implantable Lead Model: 5076
Implantable Pulse Generator Implant Date: 20200123
Lead Channel Impedance Value: 1140 Ohm
Lead Channel Impedance Value: 1159 Ohm
Lead Channel Impedance Value: 1197 Ohm
Lead Channel Impedance Value: 1235 Ohm
Lead Channel Impedance Value: 1444 Ohm
Lead Channel Impedance Value: 1539 Ohm
Lead Channel Impedance Value: 342 Ohm
Lead Channel Impedance Value: 380 Ohm
Lead Channel Impedance Value: 456 Ohm
Lead Channel Impedance Value: 494 Ohm
Lead Channel Impedance Value: 608 Ohm
Lead Channel Impedance Value: 798 Ohm
Lead Channel Impedance Value: 836 Ohm
Lead Channel Impedance Value: 874 Ohm
Lead Channel Pacing Threshold Amplitude: 0.75 V
Lead Channel Pacing Threshold Amplitude: 0.875 V
Lead Channel Pacing Threshold Amplitude: 1 V
Lead Channel Pacing Threshold Pulse Width: 0.4 ms
Lead Channel Pacing Threshold Pulse Width: 0.4 ms
Lead Channel Pacing Threshold Pulse Width: 0.5 ms
Lead Channel Sensing Intrinsic Amplitude: 0.875 mV
Lead Channel Sensing Intrinsic Amplitude: 0.875 mV
Lead Channel Sensing Intrinsic Amplitude: 9.125 mV
Lead Channel Sensing Intrinsic Amplitude: 9.125 mV
Lead Channel Setting Pacing Amplitude: 1.5 V
Lead Channel Setting Pacing Amplitude: 2 V
Lead Channel Setting Pacing Amplitude: 2.5 V
Lead Channel Setting Pacing Pulse Width: 0.4 ms
Lead Channel Setting Pacing Pulse Width: 0.5 ms
Lead Channel Setting Sensing Sensitivity: 2 mV

## 2019-10-22 ENCOUNTER — Other Ambulatory Visit: Payer: Self-pay

## 2019-10-22 DIAGNOSIS — B349 Viral infection, unspecified: Secondary | ICD-10-CM | POA: Diagnosis not present

## 2019-10-22 DIAGNOSIS — Z20822 Contact with and (suspected) exposure to covid-19: Secondary | ICD-10-CM

## 2019-10-23 LAB — NOVEL CORONAVIRUS, NAA: SARS-CoV-2, NAA: NOT DETECTED

## 2019-10-26 ENCOUNTER — Other Ambulatory Visit: Payer: Self-pay

## 2019-10-26 ENCOUNTER — Ambulatory Visit (HOSPITAL_COMMUNITY)
Admission: RE | Admit: 2019-10-26 | Discharge: 2019-10-26 | Disposition: A | Discharge status: Home / Self Care | Payer: Medicare Other | Source: Ambulatory Visit | Attending: Internal Medicine | Admitting: Internal Medicine

## 2019-10-26 DIAGNOSIS — G4733 Obstructive sleep apnea (adult) (pediatric): Secondary | ICD-10-CM | POA: Diagnosis not present

## 2019-10-26 DIAGNOSIS — E669 Obesity, unspecified: Secondary | ICD-10-CM

## 2019-10-26 DIAGNOSIS — I480 Paroxysmal atrial fibrillation: Secondary | ICD-10-CM

## 2019-10-26 DIAGNOSIS — J449 Chronic obstructive pulmonary disease, unspecified: Secondary | ICD-10-CM

## 2019-10-26 DIAGNOSIS — I5022 Chronic systolic (congestive) heart failure: Secondary | ICD-10-CM

## 2019-10-26 DIAGNOSIS — Z6841 Body Mass Index (BMI) 40.0 and over, adult: Secondary | ICD-10-CM

## 2019-10-26 NOTE — Patient Instructions (Signed)
It was good speaking with you today, no medication changes are needed at this time.  Your physician recommends that you schedule a follow-up appointment in: 6 months with Dr Haroldine Laws - Please give our office a call if we have not reached out to you by May 2021.  Do the following things EVERYDAY: 1) Weigh yourself in the morning before breakfast. Write it down and keep it in a log. 2) Take your medicines as prescribed 3) Eat low salt foods-Limit salt (sodium) to 2000 mg per day.  4) Stay as active as you can everyday 5) Limit all fluids for the day to less than 2 liters  At the [facility], you and your health needs are our priority. As part of our continuing mission to provide you with exceptional heart care, we have created designated Provider Care Teams. These Care Teams include your primary Cardiologist (physician) and Advanced Practice Providers (APPs- Physician Assistants and Nurse Practitioners) who all work together to provide you with the care you need, when you need it.   You may see any of the following providers on your designated Care Team at your next follow up: [name] Dr Glori Bickers . Dr Loralie Champagne . Darrick Grinder, NP . Lyda Jester, PA . Audry Riles, PharmD   Please be sure to bring in all your medications bottles to every appointment.

## 2019-10-26 NOTE — Addendum Note (Signed)
Encounter addended by: Kerry Dory, CMA on: 10/26/2019 4:11 PM  Actions taken: Clinical Note Signed

## 2019-10-26 NOTE — Progress Notes (Signed)
Heart Failure TeleHealth Note  Due to national recommendations of social distancing due to [place] 19, Audio/video telehealth visit is felt to be most appropriate for this patient at this time.  See MyChart message from today for patient consent regarding telehealth for [facility].  Date:  10/26/2019   ID:  Nicholas Foley, DOB Dec 07, 1950, MRN [id number]  Location: Home  Provider location: [facility] Type of Visit: Established patient  PCP:  [name], MD  Cardiologist:  [name], MD Primary HF: Monserrate Blaschke  Chief Complaint: Heart Failure follow-up   History of Present Illness:  Nicholas Foley is a 68 year old with a history of morbid obesity, chronic systolic heart failure, COPD, OSA, PAF, previously on amio-> stopped 2018,  HTN, LBBB, Medtronic CRT-P, and former smoker (quit 7-8 years ago).   He has h/o NICM which seems to date back to 2014. Had cardiac cath in 1/20 with no CAD and well compensated filling pressures.   Admitted in January 2020 for CRT-P device in setting of LBBB. Intially he felt a lot better but has gradually declined. Over the last month he has been having ongoing episodes with dyspnea and fatigue. Saw Dr. [name] who referred him here for further evaluation.   He presents via Engineer, civil (consulting) for a telehealth visit today due to COVID crisis. Recently had bad virus (not COVID). Now feeling better. Getting around the house without any problems. No edema, orthopnea or PND. Using CPAP every night. Taking all medications without any problem. Working full time in [place].   Cardiac Studies CPX 09/18/19  Limited by obesity and mild heart failure Peak VO2 15.1  (80% predicted peak VO2) - when corrected to ibw pVO2 27.5 [place] 33 RER 1.02   LHC/RHC 11/2018 No coronary disease.  RA 13 PA 37/20 (27)  PCWP 14 CO 8.5 CI 3.3   ECHO 09/27/2019 -RV normal EF 45-50%  ECHO 09/2018 -EF 35-40% Grade II DD    Nicholas Foley denies symptoms worrisome for COVID 19.   Past Medical History:  Diagnosis Date  . Atrial fibrillation with rapid ventricular response ([place]) 08/02/2013  . CHF (congestive heart failure) ([place])   . Chronic systolic dysfunction of left ventricle 08/02/2013   BNP greater than 2000   . COPD (chronic obstructive pulmonary disease) (H[facility])    Moderate by PFTs with response to bronchodilators, May 2014  . Erectile dysfunction   . Hypertension   . Insomnia 08/02/13  . Left bundle branch block 08/02/2013  . Morbid obesity ([place]) 08/02/2013  . OSA (obstructive sleep apnea)    severe OSA with AHI 74/hr now on CPAP at 8cm H2O   Past Surgical History:  Procedure Laterality Date  . BIV PACEMAKER INSERTION CRT-P N/A 12/14/2018   Medtronic model [id number] Percepta Quad CRT-P MRI Rolan Bucco (serial Number [id number]) device implanted by Dr [name] for CHF and LBBB  . CARDIOVERSION N/A 08/06/2013   Procedure: CARDIOVERSION;  Surgeon: Lelon Perla, MD;  Location: [facility] ENDOSCOPY;  Service: Cardiovascular;  Laterality: N/A;  spoke with [name]   . CARDIOVERSION N/A 09/03/2013   Procedure: CARDIOVERSION;  Surgeon: [name], MD;  Location: [facility] ENDOSCOPY;  Service: Cardiovascular;  Laterality: N/A;  . chronic systolic dysfu    . NASAL SEPTUM SURGERY    . RIGHT/LEFT HEART CATH AND CORONARY ANGIOGRAPHY N/A 12/04/2018   Procedure: RIGHT/LEFT HEART CATH AND CORONARY ANGIOGRAPHY;  Surgeon: Belva Crome, MD;  Location: [facility]  INVASIVE CV LAB;  Service: Cardiovascular;  Laterality: N/A;  . TEE WITHOUT CARDIOVERSION N/A 08/06/2013   Procedure: TRANSESOPHAGEAL ECHOCARDIOGRAM (TEE);  Surgeon: Lelon Perla, MD;  Location: [facility] ENDOSCOPY;  Service: Cardiovascular;  Laterality: N/A;  . VASECTOMY       Current Outpatient Medications  Medication Sig Dispense Refill  . acetaminophen (TYLENOL) 650 MG CR tablet Take 1,300 mg by mouth every 8 (eight) hours as needed for pain.     [name] apixaban (ELIQUIS) 5 MG TABS  tablet Take 1 tablet (5 mg total) by mouth 2 (two) times daily. 180 tablet 1  . carvedilol (COREG) 6.25 MG tablet TAKE 1 TABLET(6.25 MG) BY MOUTH TWICE DAILY 60 tablet 9  . cholecalciferol (VITAMIN D3) 25 MCG (1000 UT) tablet Take 1,000 Units by mouth daily.    [name] CIALIS 20 MG tablet Take 20 mg by mouth daily as needed for erectile dysfunction.   0  . cyclobenzaprine (FLEXERIL) 10 MG tablet Take 10 mg by mouth as needed.    . fluticasone (FLONASE) 50 MCG/ACT nasal spray Place 1 spray into both nostrils 2 (two) times daily.   0  . Fluticasone-Salmeterol (ADVAIR) 250-50 MCG/DOSE AEPB Inhale 1 puff into the lungs 2 (two) times daily.     . furosemide (LASIX) 40 MG tablet TAKE 2 TABLETS(80 MG) BY MOUTH TWICE DAILY 360 tablet 3  . omeprazole (PRILOSEC OTC) 20 MG tablet Take 20 mg by mouth daily.    . potassium chloride SA (K-DUR) 20 MEQ tablet Take 1 tablet (20 mEq total) by mouth daily. 90 tablet 3  . sacubitril-valsartan (ENTRESTO) 97-103 MG Take 1 tablet by mouth 2 (two) times daily. 60 tablet 6  . spironolactone (ALDACTONE) 25 MG tablet Take 0.5 tablets (12.5 mg total) by mouth daily. 45 tablet 3  . umeclidinium-vilanterol (ANORO ELLIPTA) 62.5-25 MCG/INH AEPB Inhale 1 puff into the lungs daily. (Patient not taking: Reported on 09/27/2019) 1 each 0  . vitamin B-12 (CYANOCOBALAMIN) 1000 MCG tablet Take 1,000 mcg by mouth daily.    [name] zolpidem (AMBIEN CR) 12.5 MG CR tablet Take 1 tablet by mouth at bedtime as needed for sleep.      No current facility-administered medications for this encounter.     Allergies:   Patient has no known allergies.   Social History:  The patient  reports that he quit smoking about 7 years ago. His smoking use included cigarettes and cigars. He has a 64.50 pack-year smoking history. He has never used smokeless tobacco. He reports current alcohol use. He reports that he does not use drugs.   Family History:  The patient's family history includes Atrial fibrillation in his  mother; COPD in his father; Cerebral aneurysm in his brother; Healthy in his daughter.   ROS:  Please see the history of present illness.   All other systems are personally reviewed and negative.   Exam:  (Video/Tele Health Call; Exam is subjective and or/visual.) General:  Speaks in full sentences. No resp difficulty. Lungs: Normal respiratory effort with conversation.  Abdomen: Obese. Non-distended per patient report Extremities: Pt denies edema. Neuro: Alert & oriented x 3.   Recent Labs: 01/04/2019: Hemoglobin 14.4; Platelets 222.0 07/13/2019: ALT 19; NT-Pro BNP 1,410 10/04/2019: BUN 16; Creatinine, Ser 1.23; Potassium 3.6; Sodium 139  Personally reviewed   Wt Readings from Last 3 Encounters:  09/27/19 (!) 148.6 kg (327 lb 9.6 oz)  09/13/19 (!) 148.3 kg (327 lb)  07/20/19 (!) 147.4 kg (325 lb)  ASSESSMENT AND PLAN:  1. Chronic Combined Systolic/Diastolic HF - NICM. 12/2018 LHC no coronary disease.  - ECHO 11/2017 EF 35-40%. ECHO 11/19 EF 45-50%.  - Echo 11/20 read as 35-40% but with Definity I fell closed to 45-50% - CPX test suboptimal effort mostly limited to obesity  - Medtronic CRT-P - NYHA II_III -Volume status ok. Continue lasix 80 mg twice a day.  - Continue carvedilol 6.25 mg twice a day - Continue 97-103 mg twice a day.  - Continue spiro 25 mg daily.  - EF not low enough for SGLT2i - CPX and echo reviewed with him today. Main limitation per CPX is due to obesity and deconditioning. Encouraged weight loss and exercise program. - We will see him again in 6 months. If stable can graduate HF program and f/u with Dr. [name]   2. PAF  - In the past he was on amiodarone. Remains in NSR Continue eliquis 5 mg twice a day   3. OSA - Continue CPAP   4. Obesity - Body mass index is 45.69 kg/m.  - As above, would likely benefit from weight loss and exercise program   5. COPD  - no longer smoking  COVID screen The patient does not have any symptoms that  suggest any further testing/ screening at this time.  Social distancing reinforced today.  Recommended follow-up:  As above  Relevant cardiac medications were reviewed at length with the patient today.   The patient does not have concerns regarding their medications at this time.   The following changes were made today:  As above  Today, I have spent 14 minutes with the patient with telehealth technology discussing the above issues .    Signed, Glori Bickers, MD  10/26/2019 3:30 PM  Advanced [facility] Failure [place] [id number] [phone] (office) [phone] (fax)

## 2019-11-12 ENCOUNTER — Ambulatory Visit (INDEPENDENT_AMBULATORY_CARE_PROVIDER_SITE_OTHER): Payer: BC Managed Care – PPO

## 2019-11-12 DIAGNOSIS — Z950 Presence of cardiac pacemaker: Secondary | ICD-10-CM | POA: Diagnosis not present

## 2019-11-12 DIAGNOSIS — I5022 Chronic systolic (congestive) heart failure: Secondary | ICD-10-CM | POA: Diagnosis not present

## 2019-11-13 ENCOUNTER — Telehealth: Payer: Self-pay

## 2019-11-13 NOTE — Telephone Encounter (Signed)
Left message for patient to remind of missed remote transmission.  

## 2019-11-13 NOTE — Progress Notes (Signed)
PPM remote 

## 2019-11-14 ENCOUNTER — Other Ambulatory Visit: Payer: Self-pay

## 2019-11-14 NOTE — Progress Notes (Signed)
EPIC Encounter for ICM Monitoring  Patient Name: Nicholas Foley is a 68 y.o. male Date: 11/14/2019 Primary Care Physican: [name], MD Primary Cardiologist:Smith/Bensimhon Electrophysiologist:Allred Bi-V Pacing:97.2% 11/05/2020Weight: 327lbs (office visit)   Transmission reviewed.   Optivolthoracic impedance normal.  Prescribed: Furosemide40 mg take 80 mg twice a day. Potassium 20 mEq take 1 tablet twice a day.   Labs: 10/04/2019 Creatinine 1.23, BUN 16, Potassium 3.6, Sodium 139, GFR >60 08/30/2019 Creatinine1.16, BUN21, Potassium4.4, Sodium140, [id number] 07/31/2019 Creatinine1.31, BUN20, Potassium4.6, Sodium141, [id number] A complete set of results can be found in Results Review.  Recommendations: None  Follow-up plan: ICM clinic phone appointment on 12/17/2019.   91 day device clinic remote transmission 01/16/2020.  Copy of ICM check sent to Dr. [name].   3 month ICM trend: 11/14/2019    1 Year ICM trend:       Rosalene Billings, RN 11/14/2019 11:14 AM

## 2019-11-17 ENCOUNTER — Emergency Department (HOSPITAL_COMMUNITY)
Admission: EM | Admit: 2019-11-17 | Discharge: 2019-11-18 | Disposition: A | Discharge status: Home / Self Care | Payer: BC Managed Care – PPO | Attending: Emergency Medicine | Admitting: Emergency Medicine

## 2019-11-17 ENCOUNTER — Encounter (HOSPITAL_COMMUNITY): Payer: Self-pay

## 2019-11-17 ENCOUNTER — Emergency Department (HOSPITAL_COMMUNITY): Payer: BC Managed Care – PPO

## 2019-11-17 ENCOUNTER — Other Ambulatory Visit: Payer: Self-pay

## 2019-11-17 DIAGNOSIS — R5383 Other fatigue: Secondary | ICD-10-CM | POA: Insufficient documentation

## 2019-11-17 DIAGNOSIS — M7918 Myalgia, other site: Secondary | ICD-10-CM | POA: Diagnosis not present

## 2019-11-17 DIAGNOSIS — Z5321 Procedure and treatment not carried out due to patient leaving prior to being seen by health care provider: Secondary | ICD-10-CM | POA: Diagnosis not present

## 2019-11-17 DIAGNOSIS — R0602 Shortness of breath: Secondary | ICD-10-CM | POA: Insufficient documentation

## 2019-11-17 DIAGNOSIS — R55 Syncope and collapse: Secondary | ICD-10-CM | POA: Diagnosis not present

## 2019-11-17 DIAGNOSIS — Z20828 Contact with and (suspected) exposure to other viral communicable diseases: Secondary | ICD-10-CM | POA: Diagnosis not present

## 2019-11-17 LAB — URINALYSIS, ROUTINE W REFLEX MICROSCOPIC
Bilirubin Urine: NEGATIVE
Glucose, UA: NEGATIVE mg/dL
Hgb urine dipstick: NEGATIVE
Ketones, ur: NEGATIVE mg/dL
Leukocytes,Ua: NEGATIVE
Nitrite: NEGATIVE
Protein, ur: 100 mg/dL — AB
Specific Gravity, Urine: 1.025 (ref 1.005–1.030)
pH: 5 (ref 5.0–8.0)

## 2019-11-17 LAB — CBC WITH DIFFERENTIAL/PLATELET
Abs Immature Granulocytes: 0.02 10*3/uL (ref 0.00–0.07)
Basophils Absolute: 0 10*3/uL (ref 0.0–0.1)
Basophils Relative: 0 %
Eosinophils Absolute: 0.1 10*3/uL (ref 0.0–0.5)
Eosinophils Relative: 2 %
HCT: 46 % (ref 39.0–52.0)
Hemoglobin: 15.1 g/dL (ref 13.0–17.0)
Immature Granulocytes: 0 %
Lymphocytes Relative: 15 %
Lymphs Abs: 1 10*3/uL (ref 0.7–4.0)
MCH: 31.8 pg (ref 26.0–34.0)
MCHC: 32.8 g/dL (ref 30.0–36.0)
MCV: 96.8 fL (ref 80.0–100.0)
Monocytes Absolute: 0.8 10*3/uL (ref 0.1–1.0)
Monocytes Relative: 12 %
Neutro Abs: 4.5 10*3/uL (ref 1.7–7.7)
Neutrophils Relative %: 71 %
Platelets: 202 10*3/uL (ref 150–400)
RBC: 4.75 MIL/uL (ref 4.22–5.81)
RDW: 12.9 % (ref 11.5–15.5)
WBC: 6.4 10*3/uL (ref 4.0–10.5)
nRBC: 0 % (ref 0.0–0.2)

## 2019-11-17 LAB — COMPREHENSIVE METABOLIC PANEL
ALT: 22 U/L (ref 0–44)
AST: 18 U/L (ref 15–41)
Albumin: 4 g/dL (ref 3.5–5.0)
Alkaline Phosphatase: 74 U/L (ref 38–126)
Anion gap: 11 (ref 5–15)
BUN: 14 mg/dL (ref 8–23)
CO2: 26 mmol/L (ref 22–32)
Calcium: 8.2 mg/dL — ABNORMAL LOW (ref 8.9–10.3)
Chloride: 96 mmol/L — ABNORMAL LOW (ref 98–111)
Creatinine, Ser: 1.25 mg/dL — ABNORMAL HIGH (ref 0.61–1.24)
GFR calc Af Amer: >60 mL/min (ref >60)
GFR calc non Af Amer: 60 mL/min — ABNORMAL LOW (ref >60)
Glucose, Bld: 111 mg/dL — ABNORMAL HIGH (ref 70–99)
Potassium: 3.7 mmol/L (ref 3.5–5.1)
Sodium: 133 mmol/L — ABNORMAL LOW (ref 135–145)
Total Bilirubin: 0.4 mg/dL (ref 0.3–1.2)
Total Protein: 7.9 g/dL (ref 6.5–8.1)

## 2019-11-17 LAB — PROTIME-INR
INR: 1.2 (ref 0.8–1.2)
Prothrombin Time: 15.5 seconds — ABNORMAL HIGH (ref 11.4–15.2)

## 2019-11-17 LAB — LACTIC ACID, PLASMA: Lactic Acid, Venous: 1.4 mmol/L (ref 0.5–1.9)

## 2019-11-17 NOTE — ED Triage Notes (Signed)
Pt states since Monday, he has had SHOB and fatigue. Pt initially thought it was the flu, but he has gotten worse. Pt states that he is unable to walk through his townhouse without resting. Pt states that he is having generalized body aches.

## 2019-11-18 DIAGNOSIS — Z20828 Contact with and (suspected) exposure to other viral communicable diseases: Secondary | ICD-10-CM | POA: Diagnosis not present

## 2019-11-22 LAB — CULTURE, BLOOD (ROUTINE X 2)
Culture: NO GROWTH
Special Requests: ADEQUATE

## 2019-11-27 DIAGNOSIS — U071 COVID-19: Secondary | ICD-10-CM | POA: Diagnosis not present

## 2019-12-03 DIAGNOSIS — J441 Chronic obstructive pulmonary disease with (acute) exacerbation: Secondary | ICD-10-CM | POA: Diagnosis not present

## 2019-12-03 DIAGNOSIS — U071 COVID-19: Secondary | ICD-10-CM | POA: Diagnosis not present

## 2019-12-17 ENCOUNTER — Ambulatory Visit (INDEPENDENT_AMBULATORY_CARE_PROVIDER_SITE_OTHER): Payer: BC Managed Care – PPO

## 2019-12-17 DIAGNOSIS — Z950 Presence of cardiac pacemaker: Secondary | ICD-10-CM | POA: Diagnosis not present

## 2019-12-17 DIAGNOSIS — I5022 Chronic systolic (congestive) heart failure: Secondary | ICD-10-CM

## 2019-12-19 NOTE — Progress Notes (Signed)
EPIC Encounter for ICM Monitoring  Patient Name: Nicholas Foley is a 69 y.o. male Date: 12/19/2019 Primary Care Physican: [name], MD Primary Cardiologist:Smith/Bensimhon Electrophysiologist:Allred Bi-V Pacing:97.2% 11/05/2020Weight: 327lbs (office visit)   Spoke with patient.  He reports is having shortness of breath and fatigue but he is not sure it related to post COVID.  He had COVID in December and has not felt well since that time.   Patient reports being at home for 4 weeks due to COVID and eating foods that were higher in salt plus drinking more fluids.    Optivolthoracic impedance suggesting possible ongoing fluid accumulation since 12/11/2019 but is starting to trend toward baseline.  Prescribed: Furosemide40 mg take 2 tablets (80 mg total) twice a day. Potassium 20 mEq take 1 tablet twice a day.   Labs: 11/17/2019 Creatinine 1.25, BUN 14, Potassium 3.7, Sodium 133, GFR >60 10/04/2019 Creatinine 1.23, BUN 16, Potassium 3.6, Sodium 139, GFR >60 08/30/2019 Creatinine1.16, BUN21, Potassium4.4, Sodium140, [id number] 07/31/2019 Creatinine1.31, BUN20, Potassium4.6, Sodium141, [id number] A complete set of results can be found in Results Review.  Recommendations: Recommendations will be given by [name], NP at 12/20/19 appointment.  Follow-up plan: ICM clinic phone appointment on2/11/2019 to recheck fluid levels. 91 day device clinic remote transmission 01/16/2020.  Office appt 12/20/2019 with [name], NP and 02/08/2020 with Dr. [name].    Copy of ICM check sent to Dr. [name] and [name], NP since patient has visit scheduled for tomorrow, 12/20/2019.   3 month ICM trend: 12/19/2019    1 Year ICM trend:       Rosalene Billings, RN 12/19/2019 10:28 AM

## 2019-12-19 NOTE — Progress Notes (Addendum)
Cardiology Office Note   Date:  12/20/2019   ID:  Nicholas Foley, DOB Dec 19, 1950, MRN [id number]  PCP:  [name], MD  Cardiologist:  Dr. [name] Primary HR:  Dr. Haroldine Laws     Chief Complaint  Patient presents with  . Shortness of Breath      History of Present Illness: Nicholas Foley is a 69 y.o. male who presents for SOB no chest pain.   He has a history of morbid obesity, chronic systolic heart failure, COPD, OSA, PAF, previously on amio->stopped 2018, HTN, LBBB, Medtronic CRT-P, and former smoker (quit 7-8 years ago).   He has h/o NICM which seems to date back to 2014. Had cardiac cath in 1/20 with no CAD and well compensated filling pressures.   Admitted in January 2020 for CRT-P device in setting of LBBB. Intially he felt a lot better but has gradually declined. Over the last month he has been having ongoing episodes with dyspnea and fatigue. Saw Dr. [name] who referred him here for further evaluation.  Works full time, OSA with CPAP every night.    Seen in ER 11/17/19 with SOB + body aches, no COVID was done.  He was + for COVID through his PCP.[name]   Has been drinking more fluids and increased salt in diet over the course of a couple of weeks once he had appetitive back.  His wife had it also and neighbors/friends dropped off food so not his low salt.   With not feeling well now a month out he had his device checked  Optivolthoracic impedance suggesting possible ongoing fluid accumulation since 12/11/2019 but is starting to trend toward baseline.  appt to see me was made.   Today with increased SOB though his wt is the same.  abd is larger and he feels bloated.  HR is elevated and BP is elevated.  No palpitations, no lightheadedness or dizziness. . No fevers recently.      Past Medical History:  Diagnosis Date  . Atrial fibrillation with rapid ventricular response ([place]) 08/02/2013  . CHF (congestive heart failure) ([place])   . Chronic systolic dysfunction of left  ventricle 08/02/2013   BNP greater than 2000   . COPD (chronic obstructive pulmonary disease) ([facility])    Moderate by PFTs with response to bronchodilators, May 2014  . Erectile dysfunction   . Hypertension   . Insomnia 08/02/13  . Left bundle branch block 08/02/2013  . Morbid obesity ([place]) 08/02/2013  . OSA (obstructive sleep apnea)    severe OSA with AHI 74/hr now on CPAP at 8cm H2O    Past Surgical History:  Procedure Laterality Date  . BIV PACEMAKER INSERTION CRT-P N/A 12/14/2018   Medtronic model [id number] Percepta Quad CRT-P MRI Rolan Bucco (serial Number [id number]) device implanted by Dr [name] for CHF and LBBB  . CARDIOVERSION N/A 08/06/2013   Procedure: CARDIOVERSION;  Surgeon: Lelon Perla, MD;  Location: [facility] ENDOSCOPY;  Service: Cardiovascular;  Laterality: N/A;  spoke with [name]   . CARDIOVERSION N/A 09/03/2013   Procedure: CARDIOVERSION;  Surgeon: [name], MD;  Location: [facility] ENDOSCOPY;  Service: Cardiovascular;  Laterality: N/A;  . chronic systolic dysfu    . NASAL SEPTUM SURGERY    . RIGHT/LEFT HEART CATH AND CORONARY ANGIOGRAPHY N/A 12/04/2018   Procedure: RIGHT/LEFT HEART CATH AND CORONARY ANGIOGRAPHY;  Surgeon: Belva Crome, MD;  Location: [place] CV LAB;  Service: Cardiovascular;  Laterality: N/A;  . TEE WITHOUT CARDIOVERSION N/A 08/06/2013  Procedure: TRANSESOPHAGEAL ECHOCARDIOGRAM (TEE);  Surgeon: Lelon Perla, MD;  Location: [facility] ENDOSCOPY;  Service: Cardiovascular;  Laterality: N/A;  . VASECTOMY       Current Outpatient Medications  Medication Sig Dispense Refill  . acetaminophen (TYLENOL) 650 MG CR tablet Take 1,300 mg by mouth every 8 (eight) hours as needed for pain.     [name] apixaban (ELIQUIS) 5 MG TABS tablet Take 1 tablet (5 mg total) by mouth 2 (two) times daily. 180 tablet 1  . carvedilol (COREG) 6.25 MG tablet TAKE 1 TABLET(6.25 MG) BY MOUTH TWICE DAILY 60 tablet 9  . cholecalciferol (VITAMIN D3) 25 MCG (1000 UT) tablet Take 1,000 Units by mouth  daily.    [name] CIALIS 20 MG tablet Take 20 mg by mouth daily as needed for erectile dysfunction.   0  . cyclobenzaprine (FLEXERIL) 10 MG tablet Take 10 mg by mouth as needed.    . fluticasone (FLONASE) 50 MCG/ACT nasal spray Place 1 spray into both nostrils 2 (two) times daily.   0  . Fluticasone-Salmeterol (ADVAIR) 250-50 MCG/DOSE AEPB Inhale 1 puff into the lungs 2 (two) times daily.     . furosemide (LASIX) 40 MG tablet TAKE 2 TABLETS(80 MG) BY MOUTH TWICE DAILY 360 tablet 3  . omeprazole (PRILOSEC OTC) 20 MG tablet Take 20 mg by mouth daily.    . potassium chloride SA (K-DUR) 20 MEQ tablet Take 1 tablet (20 mEq total) by mouth daily. 90 tablet 3  . sacubitril-valsartan (ENTRESTO) 97-103 MG Take 1 tablet by mouth 2 (two) times daily. 60 tablet 6  . spironolactone (ALDACTONE) 25 MG tablet Take 0.5 tablets (12.5 mg total) by mouth daily. 45 tablet 3  . vitamin B-12 (CYANOCOBALAMIN) 1000 MCG tablet Take 1,000 mcg by mouth daily.    [name] zolpidem (AMBIEN CR) 12.5 MG CR tablet Take 1 tablet by mouth at bedtime as needed for sleep.      No current facility-administered medications for this visit.    Allergies:   Patient has no known allergies.    Social History:  The patient  reports that he quit smoking about 7 years ago. His smoking use included cigarettes and cigars. He has a 64.50 pack-year smoking history. He has never used smokeless tobacco. He reports current alcohol use. He reports that he does not use drugs.   Family History:  The patient's family history includes Atrial fibrillation in his mother; COPD in his father; Cerebral aneurysm in his brother; Healthy in his daughter.    ROS:  General:no colds or fevers, no weight changes- though he may have lost wt with COVID and this is elevated for him Skin:no rashes or ulcers HEENT:no blurred vision, no congestion CV:see HPI PUL:see HPI GI:no diarrhea constipation or melena, no indigestion GU:no hematuria, no dysuria MS:no joint pain, no  claudication Neuro:no syncope, no lightheadedness Endo:no diabetes, no thyroid disease  Wt Readings from Last 3 Encounters:  12/20/19 (!) 326 lb 6.4 oz (148.1 kg)  09/27/19 (!) 327 lb 9.6 oz (148.6 kg)  09/13/19 (!) 327 lb (148.3 kg)     PHYSICAL EXAM: VS:  BP (!) 152/92   Pulse (!) 105   Ht 5\' 11"  (1.803 m)   Wt (!) 326 lb 6.4 oz (148.1 kg)   SpO2 95%   BMI 45.52 kg/m  , BMI Body mass index is 45.52 kg/m. General:Pleasant affect, NAD Skin:Warm and dry, brisk capillary refill HEENT:normocephalic, sclera clear, mucus membranes moist Neck:supple, mild JVD, no bruits  Heart:S1S2 RRR without  murmur, gallup, rub or click Lungs:diminshed in bases without rales, rhonchi, or wheezes [id number], non tender, + BS, do not palpate liver spleen or masses Ext:tr lower ext edema, 2+ pedal pulses, 2+ radial pulses Neuro:alert and oriented X 3, MAE, follows commands, + facial symmetry    EKG:  EKG is ordered today. The ekg ordered today demonstrates sinus tach with V pacing.    Recent Labs: 12/20/2019: ALT 28; BUN 11; Creatinine, Ser 1.24; Hemoglobin WILL FOLLOW; NT-Pro BNP 1,939; Platelets WILL FOLLOW; Potassium 4.0; Sodium 140    Lipid Panel    Component Value Date/Time   CHOL 184 08/03/2013 0640   TRIG 149 08/03/2013 0640   HDL 50 08/03/2013 0640   CHOLHDL 3.7 08/03/2013 0640   VLDL 30 08/03/2013 0640   LDLCALC 104 (H) 08/03/2013 0640       Other studies Reviewed: Additional studies/ records that were reviewed today include: . Cardiac Studies CPX 09/18/19  Limited by obesity and mild heart failure Peak VO2 15.1 (80% predicted peak VO2) - when corrected to ibw pVO2 27.5 [place] 33 RER 1.02  LHC/RHC 11/2018 No coronary disease.  RA 13 PA 37/20 (27)  PCWP 14 CO 8.5 CI 3.3  ECHO 09/27/2019 -RV normal EF 45-50% per Dr. Haroldine Laws on Echo report 35-40%  ECHO 09/2018-EF 35-40% Grade II DD      ASSESSMENT AND PLAN:  1.  Acute on chronic systolic heart failure.   Will check BNP, CMP and CBC and CXR with recent bout of COVID - increase lasix to 160 mg in AM for 3 days and continue 80 in pm then after 3 days go back to 80 BID.  Will have him follow up with Dr. Haroldine Laws in 1 week.   Pt will call if no improvement with increase of lasix.    2. ST may be from acute  HF - will hold off on increasing BB until HF controlled  3.  HTN - lasix will decrease the BP as well.     4.  PAF on eliquis and in ST today  - at times P waves difficult to see. Pt is on eliquis.  - if a fib may improve with diuresis.  Will check with EP on pacer interrogation what rhythm report not back yet.  LBBB  5.  NICM on entresto lasix aldactone coreg 6.25 BID  6.  Obesity would benefit from wt loss once he has recovered from [place]  7.  COPD without wheezing.  No longer smokes.    Pt will have follow up with EP - APP for optimization of BiV pacer.    Current medicines are reviewed with the patient today.  The patient Has no concerns regarding medicines.  The following changes have been made:  See above Labs/ tests ordered today include:see above  Disposition:   FU:  see above   Addendum CXR with PNA reviewed with Dr. Acie Fredrickson will send to PCP for management - I did call pt and give results of CXR - on phone he did sound less SOB after taking extra lasix  Pt will contact Dr. Justin Mend in AM     Signed, [name], NP  12/20/2019 [address] [place], [place], [place], [place] Phone: [phone]; Fax: [phone]

## 2019-12-20 ENCOUNTER — Encounter: Payer: Self-pay | Admitting: Cardiology

## 2019-12-20 ENCOUNTER — Ambulatory Visit: Payer: BC Managed Care – PPO | Admitting: Cardiology

## 2019-12-20 ENCOUNTER — Ambulatory Visit
Admission: RE | Admit: 2019-12-20 | Discharge: 2019-12-20 | Disposition: A | Discharge status: Home / Self Care | Payer: BC Managed Care – PPO | Source: Ambulatory Visit | Attending: Cardiology | Admitting: Cardiology

## 2019-12-20 ENCOUNTER — Other Ambulatory Visit: Payer: Self-pay

## 2019-12-20 ENCOUNTER — Telehealth (HOSPITAL_COMMUNITY): Payer: Self-pay | Admitting: Vascular Surgery

## 2019-12-20 VITALS — BP 152/92 | HR 105 | Ht 71.0 in | Wt 326.4 lb

## 2019-12-20 DIAGNOSIS — Z8616 Personal history of COVID-19: Secondary | ICD-10-CM

## 2019-12-20 DIAGNOSIS — I480 Paroxysmal atrial fibrillation: Secondary | ICD-10-CM

## 2019-12-20 DIAGNOSIS — I5023 Acute on chronic systolic (congestive) heart failure: Secondary | ICD-10-CM

## 2019-12-20 DIAGNOSIS — I428 Other cardiomyopathies: Secondary | ICD-10-CM

## 2019-12-20 DIAGNOSIS — I447 Left bundle-branch block, unspecified: Secondary | ICD-10-CM

## 2019-12-20 DIAGNOSIS — R0602 Shortness of breath: Secondary | ICD-10-CM

## 2019-12-20 DIAGNOSIS — I493 Ventricular premature depolarization: Secondary | ICD-10-CM | POA: Diagnosis not present

## 2019-12-20 DIAGNOSIS — I5022 Chronic systolic (congestive) heart failure: Secondary | ICD-10-CM | POA: Diagnosis not present

## 2019-12-20 DIAGNOSIS — I10 Essential (primary) hypertension: Secondary | ICD-10-CM

## 2019-12-20 LAB — COMPREHENSIVE METABOLIC PANEL
ALT: 28 IU/L (ref 0–44)
AST: 19 IU/L (ref 0–40)
Albumin/Globulin Ratio: 1.4 (ref 1.2–2.2)
Albumin: 4.1 g/dL (ref 3.8–4.8)
Alkaline Phosphatase: 83 IU/L (ref 39–117)
BUN/Creatinine Ratio: 9 — ABNORMAL LOW (ref 10–24)
BUN: 11 mg/dL (ref 8–27)
Bilirubin Total: 0.4 mg/dL (ref 0.0–1.2)
CO2: 23 mmol/L (ref 20–29)
Calcium: 9.2 mg/dL (ref 8.6–10.2)
Chloride: 100 mmol/L (ref 96–106)
Creatinine, Ser: 1.24 mg/dL (ref 0.76–1.27)
GFR calc Af Amer: 70 mL/min/{1.73_m2} (ref >59)
GFR calc non Af Amer: 60 mL/min/{1.73_m2} (ref >59)
Globulin, Total: 2.9 g/dL (ref 1.5–4.5)
Glucose: 117 mg/dL — ABNORMAL HIGH (ref 65–99)
Potassium: 4 mmol/L (ref 3.5–5.2)
Sodium: 140 mmol/L (ref 134–144)
Total Protein: 7 g/dL (ref 6.0–8.5)

## 2019-12-20 LAB — PRO B NATRIURETIC PEPTIDE: NT-Pro BNP: 1939 pg/mL — ABNORMAL HIGH (ref 0–376)

## 2019-12-20 LAB — CBC
Hematocrit: 41.4 % (ref 37.5–51.0)
Hemoglobin: 14.6 g/dL (ref 13.0–17.7)
MCH: 31.5 pg (ref 26.6–33.0)
MCHC: 35.3 g/dL (ref 31.5–35.7)
MCV: 89 fL (ref 79–97)
Platelets: 236 10*3/uL (ref 150–450)
RBC: 4.64 x10E6/uL (ref 4.14–5.80)
RDW: 12.8 % (ref 11.6–15.4)
WBC: 6.8 10*3/uL (ref 3.4–10.8)

## 2019-12-20 NOTE — Patient Instructions (Addendum)
Medication Instructions:  Your physician has recommended you make the following change in your medication:  1.  INCREASE the Lasix to 160 in the a.m, and 80 in the afternoon for 3 DAYS then go back to 80 mg twice a day  *If you need a refill on your cardiac medications before your next appointment, please call your pharmacy*  Lab Work: TODAY:  CMET, CBC, & PRO BNP  If you have labs (blood work) drawn today and your tests are completely normal, you will receive your results only by: [name] MyChart Message (if you have MyChart) OR . A paper copy in the mail If you have any lab test that is abnormal or we need to change your treatment, we will call you to review the results.  Testing/Procedures: A chest x-ray takes a picture of the organs and structures inside the chest, including the heart, lungs, and blood vessels. This test can show several things, including, whether the heart is enlarges; whether fluid is building up in the lungs; and whether pacemaker / defibrillator leads are still in place. Go to [address] today when you leave here. [phone]  Follow-Up: At [facility], you and your health needs are our priority.  As part of our continuing mission to provide you with exceptional heart care, we have created designated Provider Care Teams.  These Care Teams include your primary Cardiologist (physician) and Advanced Practice Providers (APPs -  Physician Assistants and Nurse Practitioners) who all work together to provide you with the care you need, when you need it.  Your next appointment:   [place] WILL CALL YOU  1 MONTH HERE WITH [place], SOMEONE WILL CALL YOU TO SCHEDULE THIS

## 2019-12-20 NOTE — Telephone Encounter (Signed)
Left pt VM to make 1 week appt w/ db , per [name], asked pt to call back to confirm 12/27/19 appt

## 2019-12-21 DIAGNOSIS — U071 COVID-19: Secondary | ICD-10-CM | POA: Diagnosis not present

## 2019-12-21 DIAGNOSIS — J189 Pneumonia, unspecified organism: Secondary | ICD-10-CM | POA: Diagnosis not present

## 2019-12-24 ENCOUNTER — Ambulatory Visit (INDEPENDENT_AMBULATORY_CARE_PROVIDER_SITE_OTHER): Payer: BC Managed Care – PPO

## 2019-12-24 DIAGNOSIS — Z950 Presence of cardiac pacemaker: Secondary | ICD-10-CM

## 2019-12-24 DIAGNOSIS — I5022 Chronic systolic (congestive) heart failure: Secondary | ICD-10-CM

## 2019-12-26 ENCOUNTER — Telehealth: Payer: Self-pay | Admitting: Interventional Cardiology

## 2019-12-26 NOTE — Telephone Encounter (Signed)
Returned pts call and he has been made aware of his lab results.

## 2019-12-26 NOTE — Telephone Encounter (Signed)
Patient returning call for lab results. You can leave detailed message if not answered.

## 2019-12-26 NOTE — Telephone Encounter (Signed)
-----   Message from Isaiah Serge, NP sent at 12/20/2019 10:35 PM EST ----- Labs were normal except lab that reveal extra fluid was elevated so lasix will help.

## 2019-12-26 NOTE — Progress Notes (Signed)
EPIC Encounter for ICM Monitoring  Patient Name: Nicholas Foley is a 69 y.o. male Date: 12/26/2019 Primary Care Physican: [name], MD Primary Cardiologist:Smith/Bensimhon Electrophysiologist:Allred Bi-V Pacing:95.2% 10/24/2020 Weight: 327lbs (office visit)  Time in AT/AF  <0.1 hr/day (<0.1%)    Spoke with patient.  He reports he is still on antibiotics for pneumonia but the fluid symptoms have resolved.  He is unsure of weight amount but clothes are looser and breathing improved since taking extra Furosemide.   Optivolthoracic impedance returned to normal after taking Furosemide 160 mg every morning and 80 mg every evening x 3 days as instructed by [name], NP at 1/26 OV.    Prescribed: Furosemide40 mg take 2 tablets (80 mg total) twice a day. Potassium 20 mEq take 1 tablet twice a day.   Labs: 12/20/2019 Creatinine 1.24, BUN 11, Potassium 4.0, Sodium 140, GFR 60-70, BNP 1,939 11/17/2019 Creatinine 1.25, BUN 14, Potassium 3.7, Sodium 133, GFR >60 10/04/2019 Creatinine 1.23, BUN 16, Potassium 3.6, Sodium 139, GFR >60 08/30/2019 Creatinine1.16, BUN21, Potassium4.4, Sodium140, [id number] 07/31/2019 Creatinine1.31, BUN20, Potassium4.6, Sodium141, [id number] A complete set of results can be found in Results Review.  Recommendations:No changes and encouraged to call if experiencing any fluid symptoms.  Follow-up plan: ICM clinic phone appointment on3/12/2019.91 day device clinic remote transmission3/11/2019.  Office appt 12/27/19 with Dr Vaughan Browner, 01/11/2020 with Oda Kilts, [place] and 02/08/2020 with Dr. [name].    Copy of ICM check sent to Dr. [name].   3 month ICM trend: 12/23/2019    1 Year ICM trend:       Rosalene Billings, RN 12/26/2019 11:21 AM

## 2019-12-27 ENCOUNTER — Other Ambulatory Visit: Payer: Self-pay

## 2019-12-27 ENCOUNTER — Encounter (HOSPITAL_COMMUNITY): Payer: Self-pay | Admitting: Internal Medicine

## 2019-12-27 ENCOUNTER — Ambulatory Visit (HOSPITAL_COMMUNITY)
Admission: RE | Admit: 2019-12-27 | Discharge: 2019-12-27 | Disposition: A | Discharge status: Home / Self Care | Payer: BC Managed Care – PPO | Source: Ambulatory Visit | Attending: Internal Medicine | Admitting: Internal Medicine

## 2019-12-27 VITALS — BP 131/80 | HR 116 | Wt 326.4 lb

## 2019-12-27 DIAGNOSIS — Z7901 Long term (current) use of anticoagulants: Secondary | ICD-10-CM | POA: Diagnosis not present

## 2019-12-27 DIAGNOSIS — J449 Chronic obstructive pulmonary disease, unspecified: Secondary | ICD-10-CM | POA: Insufficient documentation

## 2019-12-27 DIAGNOSIS — Z87891 Personal history of nicotine dependence: Secondary | ICD-10-CM | POA: Diagnosis not present

## 2019-12-27 DIAGNOSIS — I110 Hypertensive heart disease with heart failure: Secondary | ICD-10-CM | POA: Insufficient documentation

## 2019-12-27 DIAGNOSIS — G4733 Obstructive sleep apnea (adult) (pediatric): Secondary | ICD-10-CM | POA: Diagnosis not present

## 2019-12-27 DIAGNOSIS — Z8616 Personal history of COVID-19: Secondary | ICD-10-CM | POA: Insufficient documentation

## 2019-12-27 DIAGNOSIS — I5042 Chronic combined systolic (congestive) and diastolic (congestive) heart failure: Secondary | ICD-10-CM | POA: Insufficient documentation

## 2019-12-27 DIAGNOSIS — Z79899 Other long term (current) drug therapy: Secondary | ICD-10-CM | POA: Diagnosis not present

## 2019-12-27 DIAGNOSIS — I480 Paroxysmal atrial fibrillation: Secondary | ICD-10-CM | POA: Insufficient documentation

## 2019-12-27 DIAGNOSIS — Z6841 Body Mass Index (BMI) 40.0 and over, adult: Secondary | ICD-10-CM | POA: Diagnosis not present

## 2019-12-27 DIAGNOSIS — I428 Other cardiomyopathies: Secondary | ICD-10-CM | POA: Insufficient documentation

## 2019-12-27 DIAGNOSIS — Z950 Presence of cardiac pacemaker: Secondary | ICD-10-CM | POA: Insufficient documentation

## 2019-12-27 LAB — BASIC METABOLIC PANEL
Anion gap: 13 (ref 5–15)
BUN: 16 mg/dL (ref 8–23)
CO2: 23 mmol/L (ref 22–32)
Calcium: 8.9 mg/dL (ref 8.9–10.3)
Chloride: 102 mmol/L (ref 98–111)
Creatinine, Ser: 1.21 mg/dL (ref 0.61–1.24)
GFR calc Af Amer: >60 mL/min (ref >60)
GFR calc non Af Amer: >60 mL/min (ref >60)
Glucose, Bld: 119 mg/dL — ABNORMAL HIGH (ref 70–99)
Potassium: 3.8 mmol/L (ref 3.5–5.1)
Sodium: 138 mmol/L (ref 135–145)

## 2019-12-27 LAB — BRAIN NATRIURETIC PEPTIDE: B Natriuretic Peptide: 137.8 pg/mL — ABNORMAL HIGH (ref 0.0–100.0)

## 2019-12-27 MED ORDER — POTASSIUM CHLORIDE CRYS ER 20 MEQ PO TBCR: 40.0000 meq | EXTENDED_RELEASE_TABLET | Freq: Every day | ORAL | 3 refills | Status: DC

## 2019-12-27 MED ORDER — FUROSEMIDE 80 MG PO TABS: 120.0000 mg | ORAL_TABLET | Freq: Two times a day (BID) | ORAL | 5 refills | Status: DC

## 2019-12-27 NOTE — Progress Notes (Signed)
Advanced Heart Failure Clinic Note   Date:  12/27/2019   ID:  [name], DOB September 23, 1951, MRN [id number]  Location: Home  Provider location: [facility] Type of Visit: Established patient  PCP:  [name], MD  Cardiologist:  [name], MD Primary HF: [name]  Chief Complaint: Heart Failure follow-up   History of Present Illness:  [name] is a 69 year old with a history of morbid obesity, chronic systolic heart failure, COPD, OSA, PAF, previously on amio-> stopped 2018,  HTN, LBBB, Medtronic CRT-P, and former smoker (quit 7-8 years ago).   He has h/o NICM which seems to date back to 2014. Had cardiac cath in 1/20 with no CAD and well compensated filling pressures.   Admitted in January 2020 for CRT-P device in setting of LBBB. Intially he felt a lot better but has gradually declined. Over the last month he has been having ongoing episodes with dyspnea and fatigue. Saw Dr. [name] who referred him here for further evaluation.   Here for f/u. Got COVID in December. Was sick but didn't get admitted. Did not need O2. Went back to work for a week. Then got worse. Saw [name] last week. Lasix increased from 80 bid to 160/80 for 3 days. Had better urine output and breathing some better. Had CXR which showed PNA and now on abx. Still SOB and fatigued. No orthopnea or PND. No fevers or chills.   Cardiac Studies CPX 09/18/19  Limited by obesity and mild heart failure Peak VO2 15.1  (80% predicted peak VO2) - when corrected to ibw pVO2 27.5 [place] 33 RER 1.02   LHC/RHC 11/2018 No coronary disease.  RA 13 PA 37/20 (27)  PCWP 14 CO 8.5 CI 3.3   ECHO 09/27/2019 -RV normal EF 45-50%  ECHO 09/2018 -EF 35-40% Grade II DD      Tajuan L Sokol denies symptoms worrisome for COVID 19.   Past Medical History:  Diagnosis Date  . Atrial fibrillation with rapid ventricular response ([place]) 08/02/2013  . CHF (congestive heart failure) ([place])     . Chronic systolic dysfunction of left ventricle 08/02/2013   BNP greater than 2000   . COPD (chronic obstructive pulmonary disease) ([facility])    Moderate by PFTs with response to bronchodilators, May 2014  . Erectile dysfunction   . Hypertension   . Insomnia 08/02/13  . Left bundle branch block 08/02/2013  . Morbid obesity ([place]) 08/02/2013  . OSA (obstructive sleep apnea)    severe OSA with AHI 74/hr now on CPAP at 8cm H2O   Past Surgical History:  Procedure Laterality Date  . BIV PACEMAKER INSERTION CRT-P N/A 12/14/2018   Medtronic model [id number] Percepta Quad CRT-P MRI Rolan Bucco (serial Number [id number]) device implanted by Dr [name] for CHF and LBBB  . CARDIOVERSION N/A 08/06/2013   Procedure: CARDIOVERSION;  Surgeon: Lelon Perla, MD;  Location: [facility] ENDOSCOPY;  Service: Cardiovascular;  Laterality: N/A;  spoke with [name]   . CARDIOVERSION N/A 09/03/2013   Procedure: CARDIOVERSION;  Surgeon: [name], MD;  Location: [facility] ENDOSCOPY;  Service: Cardiovascular;  Laterality: N/A;  . chronic systolic dysfu    . NASAL SEPTUM SURGERY    . RIGHT/LEFT HEART CATH AND CORONARY ANGIOGRAPHY N/A 12/04/2018   Procedure: RIGHT/LEFT HEART CATH AND CORONARY ANGIOGRAPHY;  Surgeon: Belva Crome, MD;  Location: [facility] CV LAB;  Service: Cardiovascular;  Laterality: N/A;  . TEE WITHOUT CARDIOVERSION N/A 08/06/2013  Procedure: TRANSESOPHAGEAL ECHOCARDIOGRAM (TEE);  Surgeon: Lelon Perla, MD;  Location: [facility] ENDOSCOPY;  Service: Cardiovascular;  Laterality: N/A;  . VASECTOMY       Current Outpatient Medications  Medication Sig Dispense Refill  . acetaminophen (TYLENOL) 650 MG CR tablet Take 1,300 mg by mouth every 8 (eight) hours as needed for pain.     [name] apixaban (ELIQUIS) 5 MG TABS tablet Take 1 tablet (5 mg total) by mouth 2 (two) times daily. 180 tablet 1  . carvedilol (COREG) 6.25 MG tablet TAKE 1 TABLET(6.25 MG) BY MOUTH TWICE DAILY 60 tablet 9  . cholecalciferol (VITAMIN D3) 25 MCG  (1000 UT) tablet Take 1,000 Units by mouth daily.    [name] CIALIS 20 MG tablet Take 20 mg by mouth daily as needed for erectile dysfunction.   0  . cyclobenzaprine (FLEXERIL) 10 MG tablet Take 10 mg by mouth as needed.    . fluticasone (FLONASE) 50 MCG/ACT nasal spray Place 1 spray into both nostrils 2 (two) times daily.   0  . Fluticasone-Salmeterol (ADVAIR) 250-50 MCG/DOSE AEPB Inhale 1 puff into the lungs 2 (two) times daily.     . furosemide (LASIX) 40 MG tablet TAKE 2 TABLETS(80 MG) BY MOUTH TWICE DAILY 360 tablet 3  . levofloxacin (LEVAQUIN) 500 MG tablet Take 500 mg by mouth daily.    [name] omeprazole (PRILOSEC OTC) 20 MG tablet Take 20 mg by mouth daily.    . potassium chloride SA (K-DUR) 20 MEQ tablet Take 1 tablet (20 mEq total) by mouth daily. 90 tablet 3  . sacubitril-valsartan (ENTRESTO) 97-103 MG Take 1 tablet by mouth 2 (two) times daily. 60 tablet 6  . spironolactone (ALDACTONE) 25 MG tablet Take 0.5 tablets (12.5 mg total) by mouth daily. 45 tablet 3  . vitamin B-12 (CYANOCOBALAMIN) 1000 MCG tablet Take 1,000 mcg by mouth daily.    [name] zolpidem (AMBIEN CR) 12.5 MG CR tablet Take 1 tablet by mouth at bedtime as needed for sleep.      No current facility-administered medications for this encounter.    Allergies:   Patient has no known allergies.   Social History:  The patient  reports that he quit smoking about 7 years ago. His smoking use included cigarettes and cigars. He has a 64.50 pack-year smoking history. He has never used smokeless tobacco. He reports current alcohol use. He reports that he does not use drugs.   Family History:  The patient's family history includes Atrial fibrillation in his mother; COPD in his father; Cerebral aneurysm in his brother; Healthy in his daughter.   ROS:  Please see the history of present illness.   All other systems are personally reviewed and negative.   Vitals:   12/27/19 1417  BP: 131/80  Pulse: (!) 116  SpO2: 94%    Exam:  General:   Obese male No resp difficulty HEENT: normal Neck: supple. Unable to see JVD. Carotids 2+ bilat; no bruits. No lymphadenopathy or thryomegaly appreciated. Cor: PMI nondisplaced. Regular tachy  No rubs, gallops or murmurs. Lungs: crackles at bases Abdomen: soft, nontender, + distended. No hepatosplenomegaly. No bruits or masses. Good bowel sounds. Extremities: no cyanosis, clubbing, rash, edema Neuro: alert & orientedx3, cranial nerves grossly intact. moves all 4 extremities w/o difficulty. Affect pleasant   Recent Labs: 12/20/2019: ALT 28; BUN 11; Creatinine, Ser 1.24; Hemoglobin 14.6; NT-Pro BNP 1,939; Platelets 236; Potassium 4.0; Sodium 140  Personally reviewed   Wt Readings from Last 3 Encounters:  12/27/19 (!) 148.1  kg (326 lb 6.4 oz)  12/20/19 (!) 148.1 kg (326 lb 6.4 oz)  09/27/19 (!) 148.6 kg (327 lb 9.6 oz)      ASSESSMENT AND PLAN:  1. Chronic Combined Systolic/Diastolic HF - NICM. 12/2018 LHC no coronary disease.  - ECHO 11/2017 EF 35-40%. ECHO 11/19 EF 45-50%.  - Echo 11/20 read as 35-40% but with Definity I fell closed to 45-50% - CPX test suboptimal effort mostly limited to obesity  - Medtronic CRT-P - ICD interrogated personally in clinic sinus tach Optivol up over past week  - NYHA III in setting of recent COVID - Volume status looks ok on exam but Optivol up suspect it is fluid but Covid can also drop impedance - Will increase lasix to 120 bid and follow closely in ICM clinic (discussed with Sharman Cheek, RN) - Continue carvedilol 6.25 mg twice a day - Continue 97-103 mg twice a day.  - Continue spiro 25 mg daily.  - EF not low enough for SGLT2i  2. PAF  - In the past he was on amiodarone. - Has low burden of AF on device interrogation. Remains in NSR Continue eliquis 5 mg twice a day   3. OSA - Continue CPAP   4. Obesity -  Body mass index is 45.52 kg/m.  5. COPD  - no longer smoking  6. COVID infection - per PCP  Signed, Glori Bickers, MD    12/27/2019 2:21 PM  Advanced [facility] [place] [address] Heart and [place] [address] [phone] (office) [phone] (fax)

## 2019-12-27 NOTE — Patient Instructions (Signed)
INCREASE Lasix to 120mg  (1.5 tabs) twice a day   INCREASE Potassium to 7meq (2 tabs) daily   Labs today We will only contact you if something comes back abnormal or we need to make some changes. Otherwise no news is good news!   Your physician recommends that you schedule a follow-up appointment in: 1 month with APP clinic   Please call office at [phone] option 2 if you have any questions or concerns.    At the [facility], you and your health needs are our priority. As part of our continuing mission to provide you with exceptional heart care, we have created designated Provider Care Teams. These Care Teams include your primary Cardiologist (physician) and Advanced Practice Providers (APPs- Physician Assistants and Nurse Practitioners) who all work together to provide you with the care you need, when you need it.   You may see any of the following providers on your designated Care Team at your next follow up: [name] Dr Glori Bickers . Dr Loralie Champagne . Darrick Grinder, NP . Lyda Jester, PA . Audry Riles, PharmD   Please be sure to bring in all your medications bottles to every appointment.

## 2019-12-28 NOTE — Progress Notes (Signed)
ICM remote transmission schedule changed to weekly x 3 weeks as requested by Dr Haroldine Laws.  Weekly remote transmissions scheduled for 01/01/20, 01/08/20 and 01/15/20.

## 2019-12-31 DIAGNOSIS — R05 Cough: Secondary | ICD-10-CM | POA: Diagnosis not present

## 2019-12-31 DIAGNOSIS — U071 COVID-19: Secondary | ICD-10-CM | POA: Diagnosis not present

## 2020-01-01 ENCOUNTER — Ambulatory Visit (INDEPENDENT_AMBULATORY_CARE_PROVIDER_SITE_OTHER): Payer: BC Managed Care – PPO

## 2020-01-01 ENCOUNTER — Ambulatory Visit
Admission: RE | Admit: 2020-01-01 | Discharge: 2020-01-01 | Disposition: A | Discharge status: Home / Self Care | Payer: BC Managed Care – PPO | Source: Ambulatory Visit | Attending: Family Medicine | Admitting: Family Medicine

## 2020-01-01 ENCOUNTER — Other Ambulatory Visit: Payer: Self-pay | Admitting: Family Medicine

## 2020-01-01 DIAGNOSIS — I5042 Chronic combined systolic (congestive) and diastolic (congestive) heart failure: Secondary | ICD-10-CM

## 2020-01-01 DIAGNOSIS — R059 Cough, unspecified: Secondary | ICD-10-CM

## 2020-01-01 DIAGNOSIS — R05 Cough: Secondary | ICD-10-CM

## 2020-01-01 DIAGNOSIS — Z950 Presence of cardiac pacemaker: Secondary | ICD-10-CM

## 2020-01-01 NOTE — Progress Notes (Signed)
EPIC Encounter for ICM Monitoring  Patient Name: Nicholas Foley is a 69 y.o. male Date: 01/01/2020 Primary Care Physican: [name], MD Primary Cardiologist:Smith/Bensimhon Electrophysiologist:Allred Bi-V Pacing:96.4% 10/24/2020 Weight: 327lbs (office visit)  Monitored VT (>4 beats)               1 Time in AT/AF  <0.1 hr/day (<0.1%)    Spoke with patient.  He is feeling much better after Lasix increase and PCP is providing him with an inhaler to help with breathing that is related to the pneumonia.  Optivolthoracic impedancenormal.  Optivol fluid index at threshold.  Prescribed:  Furosemide80 mg take1.5 tablets (120 mgtotal)twice a day.   Potassium 20 mEq take 2 tablets daily  Labs: 12/27/2019 Creatinine 1.21, BUN 16, Potassium 3.8, Sodium 138, GFR >60, BNP 137.8 12/20/2019 Creatinine 1.24, BUN 11, Potassium 4.0, Sodium 140, GFR 60-70, BNP 1,939 11/17/2019 Creatinine 1.25, BUN 14, Potassium 3.7, Sodium 133, GFR >60 10/04/2019 Creatinine 1.23, BUN 16, Potassium 3.6, Sodium 139, GFR >60 08/30/2019 Creatinine1.16, BUN21, Potassium4.4, Sodium140, [id number] 07/31/2019 Creatinine1.31, BUN20, Potassium4.6, Sodium141, [id number] A complete set of results can be found in Results Review.  Recommendations: No changes and encouraged to call if experiencing any fluid symptoms.  Advised will check weekly report as requested by Dr Haroldine Laws for the new few weeks.  Follow-up plan: ICM clinic phone appointment on2/16/2021.91 day device clinic remote transmission3/11/2019.Office appt 01/11/2020 with Oda Kilts, [place] 02/08/2020 with Dr. [name].   Copy of ICM check sent to Dr.Allred and Dr Haroldine Laws.   3 month ICM trend: 12/31/2019    1 Year ICM trend:       Rosalene Billings, RN 01/01/2020 7:56 AM

## 2020-01-07 DIAGNOSIS — G4733 Obstructive sleep apnea (adult) (pediatric): Secondary | ICD-10-CM | POA: Diagnosis not present

## 2020-01-08 ENCOUNTER — Ambulatory Visit (INDEPENDENT_AMBULATORY_CARE_PROVIDER_SITE_OTHER): Payer: BC Managed Care – PPO

## 2020-01-08 DIAGNOSIS — Z950 Presence of cardiac pacemaker: Secondary | ICD-10-CM

## 2020-01-08 DIAGNOSIS — I5042 Chronic combined systolic (congestive) and diastolic (congestive) heart failure: Secondary | ICD-10-CM

## 2020-01-09 NOTE — Progress Notes (Addendum)
EPIC Encounter for ICM Monitoring  Patient Name: Nicholas Foley is a 69 y.o. male Date: 01/09/2020 Primary Care Physican: [name], MD Primary Cardiologist:Smith/Bensimhon Electrophysiologist:Allred Bi-V Pacing:96.8% 2/3/2021Weight: 327lbs (office visit)  Monitored VT (>4 beats)                 1 Time in AT/AF  <0.1 hr/day (<0.1%)    Spoke with patient.  He is feeling fine and today is his first day back at work since December.  Advised patient to update DPR form at 2/19 OV   Optivolthoracic impedanceremains normal.  Optivol fluid index at threshold.  Prescribed:  Furosemide80 mg take1.5 tablets (120 mgtotal)twice a day.   Potassium 20 mEq take 2 tablets daily  Labs: 12/27/2019 Creatinine 1.21, BUN 16, Potassium 3.8, Sodium 138, GFR >60, BNP 137.8 12/20/2019 Creatinine 1.24, BUN 11, Potassium 4.0, Sodium 140, GFR 60-70, BNP 1,939 11/17/2019 Creatinine 1.25, BUN 14, Potassium 3.7, Sodium 133, GFR >60 10/04/2019 Creatinine 1.23, BUN 16, Potassium 3.6, Sodium 139, GFR >60 08/30/2019 Creatinine1.16, BUN21, Potassium4.4, Sodium140, [id number] 07/31/2019 Creatinine1.31, BUN20, Potassium4.6, Sodium141, [id number] A complete set of results can be found in Results Review.  Recommendations: No changes and encouraged to call if experiencing any fluid symptoms.  Advised will check weekly report as requested by Dr Haroldine Laws for the next 2 weeks.  Follow-up plan: ICM clinic phone appointment on2/23/2021.91 day device clinic remote transmission3/11/2019.Office appt2/19/2021 with Oda Kilts, [place] 02/08/2020 with Dr. [name].   Copy of ICM check sent to Dr.Allred and Dr Haroldine Laws.  3 month ICM trend: 01/07/2020    1 Year ICM trend:       Rosalene Billings, RN 01/09/2020 4:09 PM

## 2020-01-11 ENCOUNTER — Encounter: Payer: BC Managed Care – PPO | Admitting: Student

## 2020-01-13 NOTE — Progress Notes (Addendum)
Cardiology Office Note Date:  01/15/2020  Patient ID:  Nicholas Foley, Nicholas Foley Mar 25, 1951, MRN [id number] PCP:  [name], MD  Cardiologist:  Dr. [name] AHF: Dr. Haroldine Laws Electrophysiology: Dr. [name]   Chief Complaint: annual device visit  History of Present Illness: Nicholas Foley is a 69 y.o. male with history of NICM, Chronic CHF (systolic), COPD (prior smoker), OSA (w/CPAP), AFib, HTN, LBBB, CRT-P, morbid obesity.  He comes in today to be seen for Dr. [name].  Last seen by him via tele-health in April 2020.  He was doing well, no changes were made to his regime.  Planned to come in as soon as COVID restrictions allowed to reduce device outputs to chronic.   He saw L. Dorene Ar, NP for Dr. [name] in Jan 2021 with increasing SOB, feeling bloated, though no weight change.  Optivol suggested volume OL.  He was in Sinus, tachycardic felt 2/2 volume OL.  He was instructed to take extra lasix for a few days and sent for CXR.  His CXR ended up finding PNA and this was referred to his PMD for management.  Most recently he saw Dr. Haroldine Laws 12/27/19, noted COVID + in Dec, though was sick, did not require O2, pt reported good urine OP with the additional lasix, was on abx for the pneumonia, feeling less SOB, but not resolved and feeling fatigued.  His lasix increased with plans to follow closely with ICM clinic.   He tells me today that since his COVID infection, he has never felt back to baseline again. He felt pretty terrible with the active infection, and is well over that feeling but says that he has never felt well again He denies fever, symptoms of illness, but remains quite limited in exertional with marked DOE.  He tells me with rest his breathing will settle, though takes longer then it ever did. His PMD a few weeks ado increased his Advair dose this helpd minimally He has had some improvement with the extra lasix after seeing Dr. Haroldine Laws, but sitll getting quite SOB with walking. He has  COPD and with his body habitus had some baseline DOE, though he has not gotten back to his baseline and in general, just feels tired all of the time.  No CP, no palpitations No bleeding or signs of bleeding   Device information MDT CRT-P, implanted 12/14/2018 DCCV 2014 (x2)  AFib HX Diagnosed 2014  AAD Hx Amiodarone started Sep 2014 >> d/c Oct 2018 with maintenance of SR and tx of his OSA  Past Medical History:  Diagnosis Date  . Atrial fibrillation with rapid ventricular response ([place]) 08/02/2013  . CHF (congestive heart failure) ([place])   . Chronic systolic dysfunction of left ventricle 08/02/2013   BNP greater than 2000   . COPD (chronic obstructive pulmonary disease) (HCC)    Moderate by PFTs with response to bronchodilators, May 2014  . Erectile dysfunction   . Hypertension   . Insomnia 08/02/13  . Left bundle branch block 08/02/2013  . Morbid obesity ([place]) 08/02/2013  . OSA (obstructive sleep apnea)    severe OSA with AHI 74/hr now on CPAP at 8cm H2O    Past Surgical History:  Procedure Laterality Date  . BIV PACEMAKER INSERTION CRT-P N/A 12/14/2018   Medtronic model [id number] Percepta Quad CRT-P MRI Rolan Bucco (serial Number [id number]) device implanted by Dr [name] for CHF and LBBB  . CARDIOVERSION N/A 08/06/2013   Procedure: CARDIOVERSION;  Surgeon: Lelon Perla, MD;  Location: [facility] ENDOSCOPY;  Service: Cardiovascular;  Laterality: N/A;  spoke with [name]   . CARDIOVERSION N/A 09/03/2013   Procedure: CARDIOVERSION;  Surgeon: [name], MD;  Location: [facility] ENDOSCOPY;  Service: Cardiovascular;  Laterality: N/A;  . chronic systolic dysfu    . NASAL SEPTUM SURGERY    . RIGHT/LEFT HEART CATH AND CORONARY ANGIOGRAPHY N/A 12/04/2018   Procedure: RIGHT/LEFT HEART CATH AND CORONARY ANGIOGRAPHY;  Surgeon: Belva Crome, MD;  Location: [facility] CV LAB;  Service: Cardiovascular;  Laterality: N/A;  . TEE WITHOUT CARDIOVERSION N/A 08/06/2013   Procedure: TRANSESOPHAGEAL  ECHOCARDIOGRAM (TEE);  Surgeon: Lelon Perla, MD;  Location: [facility] ENDOSCOPY;  Service: Cardiovascular;  Laterality: N/A;  . VASECTOMY      Current Outpatient Medications  Medication Sig Dispense Refill  . acetaminophen (TYLENOL) 650 MG CR tablet Take 1,300 mg by mouth every 8 (eight) hours as needed for pain.     [name] apixaban (ELIQUIS) 5 MG TABS tablet Take 1 tablet (5 mg total) by mouth 2 (two) times daily. 180 tablet 1  . carvedilol (COREG) 6.25 MG tablet TAKE 1 TABLET(6.25 MG) BY MOUTH TWICE DAILY 60 tablet 9  . cholecalciferol (VITAMIN D3) 25 MCG (1000 UT) tablet Take 1,000 Units by mouth daily.    [name] CIALIS 20 MG tablet Take 20 mg by mouth daily as needed for erectile dysfunction.   0  . cyclobenzaprine (FLEXERIL) 10 MG tablet Take 10 mg by mouth as needed.    . fluticasone (FLONASE) 50 MCG/ACT nasal spray Place 1 spray into both nostrils 2 (two) times daily.   0  . furosemide (LASIX) 80 MG tablet Take 1 tablet (80 mg total) by mouth 2 (two) times daily. TAKE 80 IN AM TAKE 80 IN PM 180 tablet 3  . levofloxacin (LEVAQUIN) 500 MG tablet Take 500 mg by mouth daily.    [name] omeprazole (PRILOSEC OTC) 20 MG tablet Take 20 mg by mouth daily.    . potassium chloride SA (KLOR-CON) 20 MEQ tablet Take 1 tablet (20 mEq total) by mouth daily. 90 tablet 3  . sacubitril-valsartan (ENTRESTO) 97-103 MG Take 1 tablet by mouth 2 (two) times daily. 60 tablet 6  . spironolactone (ALDACTONE) 25 MG tablet Take 0.5 tablets (12.5 mg total) by mouth daily. 45 tablet 3  . vitamin B-12 (CYANOCOBALAMIN) 1000 MCG tablet Take 1,000 mcg by mouth daily.    [name] zolpidem (AMBIEN CR) 12.5 MG CR tablet Take 1 tablet by mouth at bedtime as needed for sleep.     [name] ADVAIR DISKUS 500-50 MCG/DOSE AEPB Inhale 500 each into the lungs 2 (two) times daily.     No current facility-administered medications for this visit.    Allergies:   Patient has no known allergies.   Social History:  The patient  reports that he quit smoking about 7  years ago. His smoking use included cigarettes and cigars. He has a 64.50 pack-year smoking history. He has never used smokeless tobacco. He reports current alcohol use. He reports that he does not use drugs.   Family History:  The patient's family history includes Atrial fibrillation in his mother; COPD in his father; Cerebral aneurysm in his brother; Healthy in his daughter.  ROS:  Please see the history of present illness.  All other systems are reviewed and otherwise negative.   PHYSICAL EXAM:  VS:  BP (!) 148/108   Pulse (!) 110   Ht 5\' 11"  (1.803 m)   Wt (!) 325 lb (147.4 kg)  SpO2 94%   BMI 45.33 kg/m  BMI: Body mass index is 45.33 kg/m. Well nourished, well developed, in no acute distress  HEENT: normocephalic, atraumatic  Neck: no JVD, carotid bruits or masses Cardiac:  RRR; tachycardic no significant murmurs, no rubs, or gallops Lungs:  CTA b/l, no crackles, he is not wheezing, no rhonchi or rales  Abd: soft, nontender, obese MS: no deformity or atrophy Ext: no edema  Skin: warm and dry, no rash Neuro:  No gross deficits appreciated Psych: euthymic mood, full affect  PPM site is stable, no tethering or discomfort   EKG:  12/27/2019 ST, V paced, 116bpm  PPM interrogation done today and reviewed by myself:  Battery and lead measurements are good 96.9% BiVE and effective BiVe pacing 2 NSVT episodes are 2 and 3 seconds, are true NSVT V sense episode klooks Sinus tach, occurred today, longest 78minute 18sec OptiVol is well below threshold/at/near zero currently HR histograms note predominantly 70-700 (most 80-90), he does have some rates 100-120 though minimally He is ST today 110  CPX 09/18/19  Limited by obesity and mild heart failure Peak VO2 15.1 (80% predicted peak VO2) - when corrected to ibw pVO2 27.5 Slope 33 RER 1.02  LHC/RHC 11/2018 No coronary disease.  RA 13 PA 37/20 (27)  PCWP 14 CO 8.5 CI 3.3  ECHO 09/27/2019 -RV normal EF 45-50% ECHO  09/2018-EF 35-40% Grade II DD   Recent Labs: 12/20/2019: ALT 28; Hemoglobin 14.6; NT-Pro BNP 1,939; Platelets 236 12/27/2019: B Natriuretic Peptide 137.8; BUN 16; Creatinine, Ser 1.21; Potassium 3.8; Sodium 138  No results found for requested labs within last 8760 hours.   Estimated Creatinine Clearance: 88.4 mL/min (by C-G formula based on SCr of 1.21 mg/dL).   Wt Readings from Last 3 Encounters:  01/15/20 (!) 325 lb (147.4 kg)  12/27/19 (!) 326 lb 6.4 oz (148.1 kg)  12/20/19 (!) 326 lb 6.4 oz (148.1 kg)     Other studies reviewed: Additional studies/records reviewed today include: summarized above  ASSESSMENT AND PLAN:  1. CRT-P     Intact function, no programming changes made  2. NICM 3. Chronic CHF (systolic/diastolic)     Grade II DD     F/u CXR after last increase in his diuretic and treatment of his pna is clear     OptiVol is at/near zero     Exam does not suggest volume OL     Weight only down 1 lb by our scale, he does not weigh at home     discussed importance of daily home weights  3. Paroxysmal Afib     CHA2DS2Vasc is 3, on Eliquis, appropriately dosed     <0.1%  burden  4. HTN     Recheck of his BP 146/96     He is asked to keep an eye on this, he reports not typically as high as today   He remains ST today, looks like he has the last couple EKGs He remains with significant DOE (since his COVID infection) RA O2 sat today is 94% He is not currently volume OL, by Optivol or exam, probably dry  I discussed briefly with Dr. [name] today Unclear cause for his ongoing significant DOE from a cardiac perspective, and he is recommended to see his PMD for formal pulmonary evaluations Will reduce his lasix back to his 80mg  BID and K+ to 52meq daily BMET and mag today  Disposition: he has f/u already in place with Dr. [name] next month.  Current medicines are reviewed at length with the patient today.  The patient did not have any concerns regarding  medicines.  Venetia Night, PA-C 01/15/2020 11:20 AM     [place] [address] [place] [address] [phone] (office)  [phone] (fax)

## 2020-01-15 ENCOUNTER — Other Ambulatory Visit: Payer: Self-pay

## 2020-01-15 ENCOUNTER — Ambulatory Visit (INDEPENDENT_AMBULATORY_CARE_PROVIDER_SITE_OTHER): Payer: BC Managed Care – PPO

## 2020-01-15 ENCOUNTER — Ambulatory Visit (INDEPENDENT_AMBULATORY_CARE_PROVIDER_SITE_OTHER): Payer: BC Managed Care – PPO | Admitting: Physician Assistant

## 2020-01-15 VITALS — BP 148/108 | HR 110 | Ht 71.0 in | Wt 325.0 lb

## 2020-01-15 DIAGNOSIS — I428 Other cardiomyopathies: Secondary | ICD-10-CM

## 2020-01-15 DIAGNOSIS — I5042 Chronic combined systolic (congestive) and diastolic (congestive) heart failure: Secondary | ICD-10-CM

## 2020-01-15 DIAGNOSIS — I10 Essential (primary) hypertension: Secondary | ICD-10-CM

## 2020-01-15 DIAGNOSIS — Z950 Presence of cardiac pacemaker: Secondary | ICD-10-CM

## 2020-01-15 DIAGNOSIS — Z79899 Other long term (current) drug therapy: Secondary | ICD-10-CM | POA: Diagnosis not present

## 2020-01-15 LAB — BASIC METABOLIC PANEL
BUN/Creatinine Ratio: 14 (ref 10–24)
BUN: 19 mg/dL (ref 8–27)
CO2: 25 mmol/L (ref 20–29)
Calcium: 9.4 mg/dL (ref 8.6–10.2)
Chloride: 98 mmol/L (ref 96–106)
Creatinine, Ser: 1.4 mg/dL — ABNORMAL HIGH (ref 0.76–1.27)
GFR calc Af Amer: 60 mL/min/{1.73_m2} (ref >59)
GFR calc non Af Amer: 52 mL/min/{1.73_m2} — ABNORMAL LOW (ref >59)
Glucose: 121 mg/dL — ABNORMAL HIGH (ref 65–99)
Potassium: 4.2 mmol/L (ref 3.5–5.2)
Sodium: 139 mmol/L (ref 134–144)

## 2020-01-15 LAB — MAGNESIUM: Magnesium: 2.1 mg/dL (ref 1.6–2.3)

## 2020-01-15 MED ORDER — POTASSIUM CHLORIDE CRYS ER 20 MEQ PO TBCR: 20.0000 meq | EXTENDED_RELEASE_TABLET | Freq: Every day | ORAL | 3 refills | Status: DC

## 2020-01-15 MED ORDER — FUROSEMIDE 80 MG PO TABS: 80.0000 mg | ORAL_TABLET | Freq: Two times a day (BID) | ORAL | 3 refills | Status: DC

## 2020-01-15 NOTE — Patient Instructions (Signed)
Medication Instructions:   START TAKING LASIX 80 MG IN THE AM AND THE PM   START TAKING POTASSIUM  20 MEQ DAILY   *If you need a refill on your cardiac medications before your next appointment, please call your pharmacy*  Lab Work:  BMET  AND [place]   If you have labs (blood work) drawn today and your tests are completely normal, you will receive your results only by: [name] MyChart Message (if you have MyChart) OR . A paper copy in the mail If you have any lab test that is abnormal or we need to change your treatment, we will call you to review the results.  Testing/Procedures:  NONE ORDERED  TODAY   Follow-Up: At [facility], you and your health needs are our priority.  As part of our continuing mission to provide you with exceptional heart care, we have created designated Provider Care Teams.  These Care Teams include your primary Cardiologist (physician) and Advanced Practice Providers (APPs -  Physician Assistants and Nurse Practitioners) who all work together to provide you with the care you need, when you need it.  Your next appointment:    DR [name] AS SCHEDULED                     AND  1 year(s)  The format for your next appointment:   In Person  Provider:   You may see  Dr. [name]  or one of the following Advanced Practice Providers on your designated Care Team:    Chanetta Marshall, NP  Tommye Standard, PA-C  Legrand Como "Jonni Sanger" [place], [place]   Other Instructions:   FOLLOW UP WITH YOUR PRIMARY CARE [place]

## 2020-01-16 NOTE — Progress Notes (Signed)
EPIC Encounter for ICM Monitoring  Patient Name: Nicholas Foley is a 69 y.o. male Date: 01/16/2020 Primary Care Physican: [name], MD Primary Cardiologist:Smith/Bensimhon Electrophysiologist:Allred Bi-V Pacing:97.5% 2/3/2021Weight: 327lbs (office visit)  Time in AT/AF <0.1 hr/day (<0.1%)    Spoke with patient.He is feeling fine and voices no complaints.  Optivolthoracic impedanceremains normal. Optivol fluid index at threshold.  Prescribed:  Furosemide80 mg take1.5tablets (120 mgtotal)twice a day.   Potassium 20 mEq take2tablets daily  Labs: 12/27/2019 Creatinine 1.21, BUN 16, Potassium 3.8, Sodium 138, GFR >60, BNP 137.8 12/20/2019 Creatinine 1.24, BUN 11, Potassium 4.0, Sodium 140, GFR 60-70, BNP 1,939 11/17/2019 Creatinine 1.25, BUN 14, Potassium 3.7, Sodium 133, GFR >60 10/04/2019 Creatinine 1.23, BUN 16, Potassium 3.6, Sodium 139, GFR >60 08/30/2019 Creatinine1.16, BUN21, Potassium4.4, Sodium140, [id number] 07/31/2019 Creatinine1.31, BUN20, Potassium4.6, Sodium141, [id number] A complete set of results can be found in Results Review.  Recommendations: No changes and encouraged to call if experiencing any fluid symptoms.  Follow-up plan: ICM clinic phone appointment on3/12/2019.91 day device clinic remote transmission3/11/2019.Office appt3/02/2020 with HF clinic NP/PA and on 02/08/2020 with Dr. [name].   Copy of ICM check sent to Dr.Allredand Dr Haroldine Laws.  3 month ICM trend: 01/15/2020    1 Year ICM trend:       Rosalene Billings, RN 01/16/2020 3:05 PM

## 2020-01-21 ENCOUNTER — Ambulatory Visit (INDEPENDENT_AMBULATORY_CARE_PROVIDER_SITE_OTHER): Payer: BC Managed Care – PPO | Admitting: *Deleted

## 2020-01-21 DIAGNOSIS — R0602 Shortness of breath: Secondary | ICD-10-CM | POA: Diagnosis not present

## 2020-01-21 DIAGNOSIS — I5042 Chronic combined systolic (congestive) and diastolic (congestive) heart failure: Secondary | ICD-10-CM | POA: Diagnosis not present

## 2020-01-21 LAB — CUP PACEART REMOTE DEVICE CHECK
Battery Remaining Longevity: 135 mo
Battery Voltage: 3.03 V
Brady Statistic AP VP Percent: 0.04 %
Brady Statistic AP VS Percent: 0.01 %
Brady Statistic AS VP Percent: 96.39 %
Brady Statistic AS VS Percent: 3.56 %
Brady Statistic RA Percent Paced: 0.05 %
Brady Statistic RV Percent Paced: 13.05 %
Date Time Interrogation Session: 20210228191646
Implantable Lead Implant Date: 20200123
Implantable Lead Implant Date: 20200123
Implantable Lead Implant Date: 20200123
Implantable Lead Location: 753858
Implantable Lead Location: 753859
Implantable Lead Location: 753860
Implantable Lead Model: 4598
Implantable Lead Model: 5076
Implantable Lead Model: 5076
Implantable Pulse Generator Implant Date: 20200123
Lead Channel Impedance Value: 1007 Ohm
Lead Channel Impedance Value: 1026 Ohm
Lead Channel Impedance Value: 1045 Ohm
Lead Channel Impedance Value: 1045 Ohm
Lead Channel Impedance Value: 1254 Ohm
Lead Channel Impedance Value: 1292 Ohm
Lead Channel Impedance Value: 323 Ohm
Lead Channel Impedance Value: 399 Ohm
Lead Channel Impedance Value: 456 Ohm
Lead Channel Impedance Value: 475 Ohm
Lead Channel Impedance Value: 551 Ohm
Lead Channel Impedance Value: 665 Ohm
Lead Channel Impedance Value: 703 Ohm
Lead Channel Impedance Value: 722 Ohm
Lead Channel Pacing Threshold Amplitude: 0.5 V
Lead Channel Pacing Threshold Amplitude: 0.625 V
Lead Channel Pacing Threshold Amplitude: 1.625 V
Lead Channel Pacing Threshold Pulse Width: 0.4 ms
Lead Channel Pacing Threshold Pulse Width: 0.4 ms
Lead Channel Pacing Threshold Pulse Width: 0.5 ms
Lead Channel Sensing Intrinsic Amplitude: 0.875 mV
Lead Channel Sensing Intrinsic Amplitude: 0.875 mV
Lead Channel Sensing Intrinsic Amplitude: 6.625 mV
Lead Channel Sensing Intrinsic Amplitude: 6.625 mV
Lead Channel Setting Pacing Amplitude: 1.5 V
Lead Channel Setting Pacing Amplitude: 2.25 V
Lead Channel Setting Pacing Amplitude: 2.5 V
Lead Channel Setting Pacing Pulse Width: 0.4 ms
Lead Channel Setting Pacing Pulse Width: 0.5 ms
Lead Channel Setting Sensing Sensitivity: 2 mV

## 2020-01-21 NOTE — Progress Notes (Signed)
PPM Remote  

## 2020-01-23 ENCOUNTER — Ambulatory Visit (INDEPENDENT_AMBULATORY_CARE_PROVIDER_SITE_OTHER): Payer: BC Managed Care – PPO

## 2020-01-23 DIAGNOSIS — I5042 Chronic combined systolic (congestive) and diastolic (congestive) heart failure: Secondary | ICD-10-CM

## 2020-01-23 DIAGNOSIS — Z950 Presence of cardiac pacemaker: Secondary | ICD-10-CM

## 2020-01-24 ENCOUNTER — Encounter (HOSPITAL_COMMUNITY): Payer: Self-pay

## 2020-01-24 ENCOUNTER — Ambulatory Visit (HOSPITAL_COMMUNITY)
Admission: RE | Admit: 2020-01-24 | Discharge: 2020-01-24 | Disposition: A | Discharge status: Home / Self Care | Payer: BC Managed Care – PPO | Source: Ambulatory Visit | Attending: Cardiology | Admitting: Cardiology

## 2020-01-24 ENCOUNTER — Other Ambulatory Visit: Payer: Self-pay

## 2020-01-24 VITALS — BP 136/82 | HR 89 | Wt 329.2 lb

## 2020-01-24 DIAGNOSIS — E538 Deficiency of other specified B group vitamins: Secondary | ICD-10-CM | POA: Diagnosis not present

## 2020-01-24 DIAGNOSIS — Z950 Presence of cardiac pacemaker: Secondary | ICD-10-CM | POA: Insufficient documentation

## 2020-01-24 DIAGNOSIS — G4733 Obstructive sleep apnea (adult) (pediatric): Secondary | ICD-10-CM | POA: Diagnosis not present

## 2020-01-24 DIAGNOSIS — I480 Paroxysmal atrial fibrillation: Secondary | ICD-10-CM | POA: Diagnosis not present

## 2020-01-24 DIAGNOSIS — I428 Other cardiomyopathies: Secondary | ICD-10-CM | POA: Insufficient documentation

## 2020-01-24 DIAGNOSIS — Z7901 Long term (current) use of anticoagulants: Secondary | ICD-10-CM | POA: Insufficient documentation

## 2020-01-24 DIAGNOSIS — R5382 Chronic fatigue, unspecified: Secondary | ICD-10-CM | POA: Diagnosis not present

## 2020-01-24 DIAGNOSIS — R0602 Shortness of breath: Secondary | ICD-10-CM | POA: Diagnosis not present

## 2020-01-24 DIAGNOSIS — Z6841 Body Mass Index (BMI) 40.0 and over, adult: Secondary | ICD-10-CM | POA: Diagnosis not present

## 2020-01-24 DIAGNOSIS — I110 Hypertensive heart disease with heart failure: Secondary | ICD-10-CM | POA: Insufficient documentation

## 2020-01-24 DIAGNOSIS — Z8616 Personal history of COVID-19: Secondary | ICD-10-CM | POA: Insufficient documentation

## 2020-01-24 DIAGNOSIS — E611 Iron deficiency: Secondary | ICD-10-CM | POA: Diagnosis not present

## 2020-01-24 DIAGNOSIS — F17210 Nicotine dependence, cigarettes, uncomplicated: Secondary | ICD-10-CM | POA: Insufficient documentation

## 2020-01-24 DIAGNOSIS — Z79899 Other long term (current) drug therapy: Secondary | ICD-10-CM | POA: Insufficient documentation

## 2020-01-24 DIAGNOSIS — J449 Chronic obstructive pulmonary disease, unspecified: Secondary | ICD-10-CM | POA: Diagnosis not present

## 2020-01-24 DIAGNOSIS — I5042 Chronic combined systolic (congestive) and diastolic (congestive) heart failure: Secondary | ICD-10-CM | POA: Diagnosis not present

## 2020-01-24 DIAGNOSIS — R0600 Dyspnea, unspecified: Secondary | ICD-10-CM | POA: Diagnosis not present

## 2020-01-24 NOTE — Patient Instructions (Signed)
Your physician has requested that you have an echocardiogram. Echocardiography is a painless test that uses sound waves to create images of your heart. It provides your doctor with information about the size and shape of your heart and how well your heart's chambers and valves are working. This procedure takes approximately one hour. There are no restrictions for this procedure.    Please follow up with the [facility] in 4-6 weeks.  At the [facility], you and your health needs are our priority. As part of our continuing mission to provide you with exceptional heart care, we have created designated Provider Care Teams. These Care Teams include your primary Cardiologist (physician) and Advanced Practice Providers (APPs- Physician Assistants and Nurse Practitioners) who all work together to provide you with the care you need, when you need it.   You may see any of the following providers on your designated Care Team at your next follow up: [name] Dr Glori Bickers . Dr Loralie Champagne . Darrick Grinder, NP . Lyda Jester, PA . Audry Riles, PharmD   Please be sure to bring in all your medications bottles to every appointment.

## 2020-01-24 NOTE — Progress Notes (Signed)
Advanced Heart Failure Clinic Note   Date:  01/24/2020   ID:  SWAYDE EMANUELSON, DOB Mar 27, 1951, MRN [id number]  Location: Home  Provider location: [place] [facility] Type of Visit: Established patient  PCP:  [name], MD  Cardiologist:  [name], MD Primary HF: Bensimhon  Chief Complaint: Heart Failure follow-up    History of Present Illness:  [name] is a 69 year old with a history of morbid obesity, chronic systolic heart failure, COPD, OSA, PAF, previously on amio-> stopped 2018,  HTN, LBBB, Medtronic CRT-P, and former smoker (quit 7-8 years ago).   He has h/o NICM which seems to date back to 2014. Had cardiac cath in 1/20 with no CAD and well compensated filling pressures.   Admitted in January 2020 for CRT-P device in setting of LBBB. Intially he felt a lot better but has gradually declined. Over the last month he has been having ongoing episodes with dyspnea and fatigue. Saw Dr. [name] who referred him here for further evaluation.   He got COVID in December 2020. Was sick but didn't get admitted. Did not need O2. Went back to work for a week. Then got worse. Saw [name] NP. Lasix increased from 80 bid to 160/80 for 3 days. Had better urine output and breathing some better. Had CXR which showed PNA and was placed on abx. However still SOB and fatigued. No orthopnea or PND. No fevers or chills. Had AHF f/u 2/4 and was volume overloaded on exam any by Optivol. His lasix was increased to 120 mg bid for several days. He had return f/u with EP on 2/23 and volume improved. Optivol improved. Lasix was reduced back down to original dose of 80 mg daily. Labs were repeated and SCr and K stable.   He now returns for [facility] f/u. Device interrogated and optivol good. Impedence up above reference curve and index below threshold. No afib detection. Despite volume status, he continues w/ exertional dypsnea. No chest pain. He also notes that his HR increases w/  physical activity. He saw his PCP today who was concerned of potential PE. Had labs drawn today and awaiting results from PCP. Was told that he may need a CT scan to further evaluate.   Cardiac Studies CPX 09/18/19  Limited by obesity and mild heart failure Peak VO2 15.1  (80% predicted peak VO2) - when corrected to ibw pVO2 27.5 Sl[place] 33 RER 1.02   LHC/RHC 11/2018 No coronary disease.  RA 13 PA 37/20 (27)  PCWP 14 CO 8.5 CI 3.3   ECHO 09/27/2019 -RV normal EF 45-50%  ECHO 09/2018 -EF 35-40% Grade II DD      Jovian L Fickle denies symptoms worrisome for COVID 19.   Past Medical History:  Diagnosis Date  . Atrial fibrillation with rapid ventricular response ([place]) 08/02/2013  . CHF (congestive heart failure) ([place])   . Chronic systolic dysfunction of left ventricle 08/02/2013   BNP greater than 2000   . COPD (chronic obstructive pulmonary disease) (HCC)    Moderate by PFTs with response to bronchodilators, May 2014  . Erectile dysfunction   . Hypertension   . Insomnia 08/02/13  . Left bundle branch block 08/02/2013  . Morbid obesity ([place]) 08/02/2013  . OSA (obstructive sleep apnea)    severe OSA with AHI 74/hr now on CPAP at 8cm H2O   Past Surgical History:  Procedure Laterality Date  . BIV PACEMAKER INSERTION CRT-P N/A 12/14/2018  Medtronic model [id number] Percepta Quad CRT-P MRI Rolan Bucco (serial Number [id number]) device implanted by Dr [name] for CHF and LBBB  . CARDIOVERSION N/A 08/06/2013   Procedure: CARDIOVERSION;  Surgeon: Lelon Perla, MD;  [facility] ENDOSCOPY;  Service: Cardiovascular;  Laterality: N/A;  spoke with [name]   . CARDIOVERSION N/A 09/03/2013   Procedure: CARDIOVERSION;  Surgeon: [name], MD;  Location: [facility] ENDOSCOPY;  Service: Cardiovascular;  Laterality: N/A;  . chronic systolic dysfu    . NASAL SEPTUM SURGERY    . RIGHT/LEFT HEART CATH AND CORONARY ANGIOGRAPHY N/A 12/04/2018   Procedure: RIGHT/LEFT HEART CATH AND CORONARY ANGIOGRAPHY;   Surgeon: Belva Crome, MD;  Location: [place] CV LAB;  Service: Cardiovascular;  Laterality: N/A;  . TEE WITHOUT CARDIOVERSION N/A 08/06/2013   Procedure: TRANSESOPHAGEAL ECHOCARDIOGRAM (TEE);  Surgeon: Lelon Perla, MD;  Location: [facility] ENDOSCOPY;  Service: Cardiovascular;  Laterality: N/A;  . VASECTOMY       Current Outpatient Medications  Medication Sig Dispense Refill  . acetaminophen (TYLENOL) 650 MG CR tablet Take 1,300 mg by mouth every 8 (eight) hours as needed for pain.     [name] ADVAIR DISKUS 500-50 MCG/DOSE AEPB Inhale 500 each into the lungs 2 (two) times daily.    [name] apixaban (ELIQUIS) 5 MG TABS tablet Take 1 tablet (5 mg total) by mouth 2 (two) times daily. 180 tablet 1  . carvedilol (COREG) 6.25 MG tablet TAKE 1 TABLET(6.25 MG) BY MOUTH TWICE DAILY 60 tablet 9  . cholecalciferol (VITAMIN D3) 25 MCG (1000 UT) tablet Take 1,000 Units by mouth daily.    [name] CIALIS 20 MG tablet Take 20 mg by mouth daily as needed for erectile dysfunction.   0  . cyclobenzaprine (FLEXERIL) 10 MG tablet Take 10 mg by mouth as needed.    . fluticasone (FLONASE) 50 MCG/ACT nasal spray Place 1 spray into both nostrils 2 (two) times daily.   0  . furosemide (LASIX) 80 MG tablet Take 1 tablet (80 mg total) by mouth 2 (two) times daily. TAKE 80 IN AM TAKE 80 IN PM 180 tablet 3  . levofloxacin (LEVAQUIN) 500 MG tablet Take 500 mg by mouth daily.    [name] omeprazole (PRILOSEC OTC) 20 MG tablet Take 20 mg by mouth daily.    . potassium chloride SA (KLOR-CON) 20 MEQ tablet Take 1 tablet (20 mEq total) by mouth daily. 90 tablet 3  . sacubitril-valsartan (ENTRESTO) 97-103 MG Take 1 tablet by mouth 2 (two) times daily. 60 tablet 6  . spironolactone (ALDACTONE) 25 MG tablet Take 0.5 tablets (12.5 mg total) by mouth daily. 45 tablet 3  . vitamin B-12 (CYANOCOBALAMIN) 1000 MCG tablet Take 1,000 mcg by mouth daily.    [name] zolpidem (AMBIEN CR) 12.5 MG CR tablet Take 1 tablet by mouth at bedtime as needed for sleep.      No  current facility-administered medications for this encounter.    Allergies:   Patient has no known allergies.   Social History:  The patient  reports that he quit smoking about 7 years ago. His smoking use included cigarettes and cigars. He has a 64.50 pack-year smoking history. He has never used smokeless tobacco. He reports current alcohol use. He reports that he does not use drugs.   Family History:  The patient's family history includes Atrial fibrillation in his mother; COPD in his father; Cerebral aneurysm in his brother; Healthy in his daughter.   ROS:  Please see the history of present  illness.   All other systems are personally reviewed and negative.   Vitals:   01/24/20 1443  BP: 136/82  Pulse: 89  SpO2: 97%    PHYSICAL EXAM: General:  Well appearing morbidly obese WM. No respiratory difficulty HEENT: normal Neck: supple. no JVD. Carotids 2+ bilat; no bruits. No lymphadenopathy or thyromegaly appreciated. Cor: PMI nondisplaced. Regular rate & rhythm. No rubs, gallops or murmurs. Lungs: clear Abdomen: soft, nontender, nondistended. No hepatosplenomegaly. No bruits or masses. Good bowel sounds. Extremities: no cyanosis, clubbing, rash, edema Neuro: alert & oriented x 3, cranial nerves grossly intact. moves all 4 extremities w/o difficulty. Affect pleasant.    Recent Labs: 12/20/2019: ALT 28; Hemoglobin 14.6; NT-Pro BNP 1,939; Platelets 236 12/27/2019: B Natriuretic Peptide 137.8 01/15/2020: BUN 19; Creatinine, Ser 1.40; Magnesium 2.1; Potassium 4.2; Sodium 139  Personally reviewed   Wt Readings from Last 3 Encounters:  01/24/20 (!) 149.3 kg (329 lb 4 oz)  01/15/20 (!) 147.4 kg (325 lb)  12/27/19 (!) 148.1 kg (326 lb 6.4 oz)      ASSESSMENT AND PLAN:  1. Chronic Combined Systolic/Diastolic HF - NICM. 12/2018 LHC no coronary disease.  - ECHO 11/2017 EF 35-40%. ECHO 11/19 EF 45-50%.  - Echo 11/20 read as 35-40% but with Definity I fell closed to 45-50% - CPX test  suboptimal effort mostly limited to obesity  - Medtronic CRT-P - ICD interrogated personally in clinic. Optivol normal range c/w euvolemia   - NYHA III in setting of recent COVID - Continue Lasix 80 mg dq - Continue carvedilol 6.25 mg twice a day - Continue Entresto 97-103 mg twice a day.  - Continue spiro 25 mg daily.  - EF not low enough for SGLT2i - given worsening dyspnea/ fatigue along w/ recent volume overload requiring diuretic adjustment post covid infection, will repeat 2D echo to reassess LVEF and r/o pericardial effusion - Agree w/ PCP w/u to r/o PE  2. PAF  - In the past he was on amiodarone. Stopped ~2017 - Has low burden of AF on device interrogation (personally reviewed today). Remains in NSR  - Continue eliquis 5 mg twice a day. He denies abnormal bleeding   3. OSA - Continue CPAP qhs   4. Obesity -  Body mass index is 45.92 kg/m.  5. COPD  - no longer smoking  6. COVID infection - per PCP - undergoing w/u to r/o PE - May need repeat PFTs if dyspnea persist   F/u in 4-6 weeks   Signed, Lyda Jester, PA-C  01/24/2020 3:04 PM  Advanced Heart Failure [place] [address] [phone] (office) [phone] (fax)

## 2020-01-25 NOTE — Progress Notes (Signed)
EPIC Encounter for ICM Monitoring  Patient Name: Nicholas Foley is a 69 y.o. male Date: 01/25/2020 Primary Care Physican: [name], MD Primary Cardiologist:Smith/Bensimhon Electrophysiologist:Allred Bi-V Pacing:95.7% 2/3/2021Weight: 327lbs (office visit)   Clinical Status (22-Jan-2020 to 24-Jan-2020) Time in AT/AF 0.0 hr/day (0.0%)   Transmission reviewed.  Patient seen by HF clinic on 01/24/2020.   Optivolthoracic impedanceremainsnormal. Optivol fluid index at threshold.  Prescribed:  Furosemide80 mg take1.5tablets (120 mgtotal)twice a day.   Potassium 20 mEq take2tablets daily  Labs: 12/27/2019 Creatinine 1.21, BUN 16, Potassium 3.8, Sodium 138, GFR >60, BNP 137.8 12/20/2019 Creatinine 1.24, BUN 11, Potassium 4.0, Sodium 140, GFR 60-70, BNP 1,939 11/17/2019 Creatinine 1.25, BUN 14, Potassium 3.7, Sodium 133, GFR >60 10/04/2019 Creatinine 1.23, BUN 16, Potassium 3.6, Sodium 139, GFR >60 08/30/2019 Creatinine1.16, BUN21, Potassium4.4, Sodium140, [id number] 07/31/2019 Creatinine1.31, BUN20, Potassium4.6, Sodium141, [id number] A complete set of results can be found in Results Review.  Recommendations: None  Follow-up plan: ICM clinic phone appointment on3/16/2021.91 day device clinic remote transmission6/12/2019.Office appt4/22/2021 with Dr Haroldine Laws and on 02/08/2020 with Dr. [name].   Copy of ICM check sent to Dr.Allredand Dr Haroldine Laws.  3 month ICM trend: 01/24/2020    1 Year ICM trend:       Rosalene Billings, RN 01/25/2020 1:33 PM

## 2020-01-30 ENCOUNTER — Other Ambulatory Visit: Payer: Self-pay

## 2020-01-30 ENCOUNTER — Ambulatory Visit (HOSPITAL_COMMUNITY)
Admission: RE | Admit: 2020-01-30 | Discharge: 2020-01-30 | Disposition: A | Discharge status: Home / Self Care | Payer: BC Managed Care – PPO | Source: Ambulatory Visit | Attending: Cardiology | Admitting: Cardiology

## 2020-01-30 DIAGNOSIS — I5042 Chronic combined systolic (congestive) and diastolic (congestive) heart failure: Secondary | ICD-10-CM | POA: Insufficient documentation

## 2020-01-30 DIAGNOSIS — Z8616 Personal history of COVID-19: Secondary | ICD-10-CM | POA: Diagnosis not present

## 2020-01-30 DIAGNOSIS — J449 Chronic obstructive pulmonary disease, unspecified: Secondary | ICD-10-CM | POA: Diagnosis not present

## 2020-01-30 DIAGNOSIS — I110 Hypertensive heart disease with heart failure: Secondary | ICD-10-CM | POA: Insufficient documentation

## 2020-01-30 DIAGNOSIS — I4891 Unspecified atrial fibrillation: Secondary | ICD-10-CM | POA: Insufficient documentation

## 2020-01-30 MED ORDER — PERFLUTREN LIPID MICROSPHERE
1.0000 mL | INTRAVENOUS | Status: AC | PRN
  Administered 2020-01-30: 2 mL via INTRAVENOUS
  Filled 2020-01-30: qty 10

## 2020-01-30 NOTE — Progress Notes (Signed)
Echocardiogram 2D Echocardiogram has been performed.  Nicholas Foley 01/30/2020, 4:16 PM

## 2020-02-05 ENCOUNTER — Ambulatory Visit (INDEPENDENT_AMBULATORY_CARE_PROVIDER_SITE_OTHER): Payer: BC Managed Care – PPO

## 2020-02-05 DIAGNOSIS — Z950 Presence of cardiac pacemaker: Secondary | ICD-10-CM

## 2020-02-05 DIAGNOSIS — I5042 Chronic combined systolic (congestive) and diastolic (congestive) heart failure: Secondary | ICD-10-CM

## 2020-02-05 NOTE — Progress Notes (Signed)
EPIC Encounter for ICM Monitoring  Patient Name: Nicholas Foley is a 69 y.o. male Date: 02/05/2020 Primary Care Physican: [name], MD Primary Cardiologist:Smith/Bensimhon Electrophysiologist:Allred Bi-V Pacing:95.7% 2/3/2021Weight: 327lbs (office visit)  Since 24-Jan-2020 Time in AT/AF  <0.1 hr/day (<0.1%)    Spoke with patient.  He has felt tired the last few days which is usually an indication to him that he has fluid.  He will cut back on salt in his diet and discuss with Dr [name] at 3/19 OV.   Optivolthoracic impedanceremainstrending slightly below baseline normal suggesting possible fluid accumulation. Optivol fluid index is < threshold.  Prescribed:  Furosemide80 mg take1.5tablets (120 mgtotal)twice a day.   Potassium 20 mEq take2tablets daily  Labs: 01/15/2020 Creatinine 1.40, BUN 19, Potassium 4.2, Sodium 139, GFR 52-60 12/27/2019 Creatinine 1.21, BUN 16, Potassium 3.8, Sodium 138, GFR >60,    BNP 137.8 12/20/2019 Creatinine 1.24, BUN 11, Potassium 4.0, Sodium 140, GFR 60-70, BNP 1,939 11/17/2019 Creatinine 1.25, BUN 14, Potassium 3.7, Sodium 133, GFR >60 10/04/2019 Creatinine 1.23, BUN 16, Potassium 3.6, Sodium 139, GFR >60 08/30/2019 Creatinine1.16, BUN21, Potassium4.4, Sodium140, [id number] 07/31/2019 Creatinine1.31, BUN20, Potassium4.6, Sodium141, [id number] A complete set of results can be found in Results Review.  Recommendations: Recommendation to limit salt intake to 2000 mg daily and fluid intake to 64 oz daily.  Encouraged to call if experiencing any fluid symptoms.   Follow-up plan: ICM clinic phone appointment on3/24/2021 to recheck fluid levels.91 day device clinic remote transmission6/12/2019.Office appt4/22/2021 with Dr Haroldine Laws and on3/19/2021 with Dr. [name].   Copy of ICM check sent to Dr.Allredand Dr Haroldine Laws.  3 month ICM trend:  02/05/2020    1 Year ICM trend:       Rosalene Billings, RN 02/05/2020 3:19 PM

## 2020-02-07 NOTE — Progress Notes (Signed)
Cardiology Office Note:    Date:  02/08/2020   ID:  Nicholas Foley, DOB Jan 10, 1951, MRN [id number]  PCP:  [name], MD  Cardiologist:  [name], MD   Referring MD: [name], MD   Chief Complaint  Patient presents with  . Congestive Heart Failure  . Atrial Fibrillation    History of Present Illness:    Nicholas Foley is a 69 y.o. male with a hx of NICM, Chronic CHF (systolic), COPD (prior smoker), OSA (w/CPAP), AFib, HTN, LBBB, CRT-P, morbid obesity.  Had COVID-19 in December.  Has lingering mental fogginess, fatigue, and dyspnea on exertion.  Was never admitted to the hospital.  Never had significant hypoxia.  Did develop an infiltrate on chest x-ray after 10 days of the disease.  Antibiotics were prescribed.  Has not been able to get back into his usual routine since developing Covid.  He did not work from late December until late January.  Now he goes into work intermittently coming home if he does not feel strong enough.  An echocardiogram was done in March demonstrating that heart function is stable.  The most recent laboratory data performed in February demonstrate a BNP that is less than 200, BUN and creatinine that are 19 and 1.4, hemoglobin 14.6, and no other significant abnormalities noted.  Getting into the office today, he is noticeably short of breath but O2 saturation is 97%.  Heart rate was 117 on check-in.  He states his breathing and heart rate take about 5 to 10 minutes to recover after moderate activity.  Past Medical History:  Diagnosis Date  . Atrial fibrillation with rapid ventricular response ([place]) 08/02/2013  . CHF (congestive heart failure) ([place])   . Chronic systolic dysfunction of left ventricle 08/02/2013   BNP greater than 2000   . COPD (chronic obstructive pulmonary disease) ([facility])    Moderate by PFTs with response to bronchodilators, May 2014  . Erectile dysfunction   . Hypertension   . Insomnia 08/02/13  . Left bundle branch block  08/02/2013  . Morbid obesity ([place]) 08/02/2013  . OSA (obstructive sleep apnea)    severe OSA with AHI 74/hr now on CPAP at 8cm H2O    Past Surgical History:  Procedure Laterality Date  . BIV PACEMAKER INSERTION CRT-P N/A 12/14/2018   Medtronic model [id number] Percepta Quad CRT-P MRI Rolan Bucco (serial Number [id number]) device implanted by Dr [name] for CHF and LBBB  . CARDIOVERSION N/A 08/06/2013   Procedure: CARDIOVERSION;  Surgeon: Lelon Perla, MD;  Location[facility] ENDOSCOPY;  Service: Cardiovascular;  Laterality: N/A;  spoke with [name]   . CARDIOVERSION N/A 09/03/2013   Procedure: CARDIOVERSION;  Surgeon: [name], MD;  Location: [facility] ENDOSCOPY;  Service: Cardiovascular;  Laterality: N/A;  . chronic systolic dysfu    . NASAL SEPTUM SURGERY    . RIGHT/LEFT HEART CATH AND CORONARY ANGIOGRAPHY N/A 12/04/2018   Procedure: RIGHT/LEFT HEART CATH AND CORONARY ANGIOGRAPHY;  Surgeon: Belva Crome, MD;  Location: [facility] CV LAB;  Service: Cardiovascular;  Laterality: N/A;  . TEE WITHOUT CARDIOVERSION N/A 08/06/2013   Procedure: TRANSESOPHAGEAL ECHOCARDIOGRAM (TEE);  Surgeon: Lelon Perla, MD;  Location: [facility] ENDOSCOPY;  Service: Cardiovascular;  Laterality: N/A;  . VASECTOMY      Current Medications: Current Meds  Medication Sig  . acetaminophen (TYLENOL) 650 MG CR tablet Take 1,300 mg by mouth every 8 (eight) hours as needed for pain.   [name] ADVAIR DISKUS 500-50 MCG/DOSE AEPB Inhale  500 each into the lungs 2 (two) times daily.  [name] apixaban (ELIQUIS) 5 MG TABS tablet Take 1 tablet (5 mg total) by mouth 2 (two) times daily.  . carvedilol (COREG) 6.25 MG tablet TAKE 1 TABLET(6.25 MG) BY MOUTH TWICE DAILY  . cholecalciferol (VITAMIN D3) 25 MCG (1000 UT) tablet Take 1,000 Units by mouth daily.  [name] CIALIS 20 MG tablet Take 20 mg by mouth daily as needed for erectile dysfunction.   . cyclobenzaprine (FLEXERIL) 10 MG tablet Take 10 mg by mouth as needed.  . fluticasone (FLONASE) 50 MCG/ACT  nasal spray Place 1 spray into both nostrils 2 (two) times daily.   . furosemide (LASIX) 80 MG tablet Take 1 tablet (80 mg total) by mouth 2 (two) times daily. TAKE 80 IN AM TAKE 80 IN PM  . levofloxacin (LEVAQUIN) 500 MG tablet Take 500 mg by mouth daily.  [name] omeprazole (PRILOSEC OTC) 20 MG tablet Take 20 mg by mouth daily.  . potassium chloride SA (KLOR-CON) 20 MEQ tablet Take 1 tablet (20 mEq total) by mouth daily.  . sacubitril-valsartan (ENTRESTO) 97-103 MG Take 1 tablet by mouth 2 (two) times daily.  [name] spironolactone (ALDACTONE) 25 MG tablet Take 0.5 tablets (12.5 mg total) by mouth daily.  . vitamin B-12 (CYANOCOBALAMIN) 1000 MCG tablet Take 1,000 mcg by mouth daily.  [name] zolpidem (AMBIEN CR) 12.5 MG CR tablet Take 1 tablet by mouth at bedtime as needed for sleep.      Allergies:   Patient has no known allergies.   Social History   Socioeconomic History  . Marital status: Married    Spouse name: Not on file  . Number of children: Not on file  . Years of education: Not on file  . Highest education level: Not on file  Occupational History  . Occupation: real estate    Comment: investment  Tobacco Use  . Smoking status: Former Smoker    Packs/day: 1.50    Years: 43.00    Pack years: 64.50    Types: Cigarettes, Cigars    Quit date: 08/02/2012    Years since quitting: 7.5  . Smokeless tobacco: Never Used  Substance and Sexual Activity  . Alcohol use: Yes    Alcohol/week: 0.0 standard drinks    Comment: once a week; previously 3-4 times per week   . Drug use: No  . Sexual activity: Yes  Other Topics Concern  . Not on file  Social History Narrative   Works as a Engineer, structural for apartments   Lives with wife.  They have one grown daughter   Highest level of education:  college   Social Determinants of Health   Financial Resource Strain:   . Difficulty of Paying Living Expenses:   Food Insecurity:   . Worried About Charity fundraiser in the Last Year:   . Arts development officer in the Last Year:   Transportation Needs:   . Film/video editor (Medical):   [name] Lack of Transportation (Non-Medical):   Physical Activity:   . Days of Exercise per Week:   . Minutes of Exercise per Session:   Stress:   . Feeling of Stress :   Social Connections:   . Frequency of Communication with Friends and Family:   . Frequency of Social Gatherings with Friends and Family:   . Attends Religious Services:   . Active Member of Clubs or Organizations:   . Attends Archivist Meetings:   [name] Marital  Status:      Family History: The patient's family history includes Atrial fibrillation in his mother; COPD in his father; Cerebral aneurysm in his brother; Healthy in his daughter.  ROS:   Please see the history of present illness.    Feels he may have long hollers disease associated with COVID-19.  All other systems reviewed and are negative.  EKGs/Labs/Other Studies Reviewed:    The following studies were reviewed today: 2D Doppler echocardiogram January 30, 2020: IMPRESSIONS    1. Left ventricular ejection fraction, by estimation, is 35 to 40%. The  left ventricle has moderately decreased function. The left ventricle  demonstrates global hypokinesis. The left ventricular internal cavity size  was severely dilated. Left  ventricular diastolic function could not be evaluated.  2. Right ventricular systolic function is normal. The right ventricular  size is normal. Tricuspid regurgitation signal is inadequate for assessing  PA pressure.  3. The mitral valve is normal in structure. No evidence of mitral valve  regurgitation. No evidence of mitral stenosis.  4. The aortic valve is normal in structure. Aortic valve regurgitation is  not visualized. No aortic stenosis is present.  5. The inferior vena cava is normal in size with greater than 50%  respiratory variability, suggesting right atrial pressure of 3 mmHg.   EKG:  EKG the EKG is not repeated.   December 27, 2019 revealed atrial tracking with ventricular pacing.  Recent Labs: 12/20/2019: ALT 28; Hemoglobin 14.6; NT-Pro BNP 1,939; Platelets 236 12/27/2019: B Natriuretic Peptide 137.8 01/15/2020: BUN 19; Creatinine, Ser 1.40; Magnesium 2.1; Potassium 4.2; Sodium 139  Recent Lipid Panel    Component Value Date/Time   CHOL 184 08/03/2013 0640   TRIG 149 08/03/2013 0640   HDL 50 08/03/2013 0640   CHOLHDL 3.7 08/03/2013 0640   VLDL 30 08/03/2013 0640   LDLCALC 104 (H) 08/03/2013 0640    Physical Exam:    VS:  BP 130/88   Pulse (!) 114   Ht 5\' 11"  (1.803 m)   Wt (!) 330 lb 6.4 oz (149.9 kg)   SpO2 97%   BMI 46.08 kg/m     Wt Readings from Last 3 Encounters:  02/08/20 (!) 330 lb 6.4 oz (149.9 kg)  01/24/20 (!) 329 lb 4 oz (149.3 kg)  01/15/20 (!) 325 lb (147.4 kg)     GEN: Morbidly obese. No acute distress HEENT: Normal NECK: No JVD. LYMPHATICS: No lymphadenopathy CARDIAC:  RRR without murmur, gallop, or edema. VASCULAR:  Normal Pulses. No bruits. RESPIRATORY:  Clear to auscultation without rales, wheezing or rhonchi  ABDOMEN: Soft, non-tender, non-distended, No pulsatile mass, MUSCULOSKELETAL: No deformity  SKIN: Warm and dry NEUROLOGIC:  Alert and oriented x 3 PSYCHIATRIC:  Normal affect   ASSESSMENT:    1. Chronic combined systolic and diastolic heart failure ([place])   2. Biventricular cardiac pacemaker in situ   3. Essential hypertension   4. PAF (paroxysmal atrial fibrillation) ([place])   5. Left bundle branch block   6. Chronic anticoagulation   7. On amiodarone therapy   8. History of COVID-19   9. Educated about COVID-19 virus infection    PLAN:    In order of problems listed above:  1. Stable, on good heart failure regimen.  I do not believe he has volume overload.  Dyspnea is currently multifactorial with a significant component related to physical deconditioning due to the COVID-19 infection.  I have recommended that he try to gradually increase  exertional tolerance day by day with  hopes that endurance will improve.  It will take a while.  His was reassured. 2. Functioning normally when last evaluated 3. Adequate blood pressure control 4. No recurrence of atrial fibrillation 5. Resynchronization therapy 6. No bleeding complications.  Continue apixaban. 7. No longer on amiodarone.  Discontinue then 2019. 8. Suffering long-haulers syndrome. 9. Recommended that the patient have COVID-19 vaccination 3 months out from his Covid infection.   Medication Adjustments/Labs and Tests Ordered: Current medicines are reviewed at length with the patient today.  Concerns regarding medicines are outlined above.  No orders of the defined types were placed in this encounter.  No orders of the defined types were placed in this encounter.   Patient Instructions  Medication Instructions:  Your physician recommends that you continue on your current medications as directed. Please refer to the Current Medication list given to you today.  *If you need a refill on your cardiac medications before your next appointment, please call your pharmacy*   Lab Work: None If you have labs (blood work) drawn today and your tests are completely normal, you will receive your results only by: [name] MyChart Message (if you have MyChart) OR . A paper copy in the mail If you have any lab test that is abnormal or we need to change your treatment, we will call you to review the results.   Testing/Procedures: None   Follow-Up: At [facility], you and your health needs are our priority.  As part of our continuing mission to provide you with exceptional heart care, we have created designated Provider Care Teams.  These Care Teams include your primary Cardiologist (physician) and Advanced Practice Providers (APPs -  Physician Assistants and Nurse Practitioners) who all work together to provide you with the care you need, when you need it.  We recommend signing up for  the patient portal called "MyChart".  Sign up information is provided on this After Visit Summary.  MyChart is used to connect with patients for Virtual Visits (Telemedicine).  Patients are able to view lab/test results, encounter notes, upcoming appointments, etc.  Non-urgent messages can be sent to your provider as well.   To learn more about what you can do with MyChart, go to [email or web address].    Your next appointment:   6 month(s)  The format for your next appointment:   In Person  Provider:   You may see [name], MD or one of the following Advanced Practice Providers on your designated Care Team:    [name], NP  [name], NP  [name], NP    Other Instructions      Signed, [name], MD  02/08/2020 1:56 PM    [place]

## 2020-02-08 ENCOUNTER — Ambulatory Visit: Payer: BC Managed Care – PPO | Admitting: Interventional Cardiology

## 2020-02-08 ENCOUNTER — Encounter: Payer: Self-pay | Admitting: Interventional Cardiology

## 2020-02-08 ENCOUNTER — Other Ambulatory Visit: Payer: Self-pay

## 2020-02-08 ENCOUNTER — Encounter (INDEPENDENT_AMBULATORY_CARE_PROVIDER_SITE_OTHER): Payer: Self-pay

## 2020-02-08 VITALS — BP 130/88 | HR 114 | Ht 71.0 in | Wt 330.4 lb

## 2020-02-08 DIAGNOSIS — Z8616 Personal history of COVID-19: Secondary | ICD-10-CM

## 2020-02-08 DIAGNOSIS — Z950 Presence of cardiac pacemaker: Secondary | ICD-10-CM | POA: Diagnosis not present

## 2020-02-08 DIAGNOSIS — I10 Essential (primary) hypertension: Secondary | ICD-10-CM | POA: Diagnosis not present

## 2020-02-08 DIAGNOSIS — I5042 Chronic combined systolic (congestive) and diastolic (congestive) heart failure: Secondary | ICD-10-CM

## 2020-02-08 DIAGNOSIS — I480 Paroxysmal atrial fibrillation: Secondary | ICD-10-CM

## 2020-02-08 DIAGNOSIS — Z79899 Other long term (current) drug therapy: Secondary | ICD-10-CM

## 2020-02-08 DIAGNOSIS — Z7189 Other specified counseling: Secondary | ICD-10-CM

## 2020-02-08 DIAGNOSIS — Z7901 Long term (current) use of anticoagulants: Secondary | ICD-10-CM

## 2020-02-08 DIAGNOSIS — I447 Left bundle-branch block, unspecified: Secondary | ICD-10-CM

## 2020-02-08 NOTE — Patient Instructions (Signed)

## 2020-02-12 ENCOUNTER — Other Ambulatory Visit: Payer: Self-pay | Admitting: *Deleted

## 2020-02-12 ENCOUNTER — Ambulatory Visit (INDEPENDENT_AMBULATORY_CARE_PROVIDER_SITE_OTHER): Payer: BC Managed Care – PPO

## 2020-02-12 DIAGNOSIS — Z950 Presence of cardiac pacemaker: Secondary | ICD-10-CM

## 2020-02-12 DIAGNOSIS — I5042 Chronic combined systolic (congestive) and diastolic (congestive) heart failure: Secondary | ICD-10-CM

## 2020-02-12 MED ORDER — APIXABAN 5 MG PO TABS: 5.0000 mg | ORAL_TABLET | Freq: Two times a day (BID) | ORAL | 2 refills | Status: DC

## 2020-02-12 NOTE — Telephone Encounter (Signed)
Eliquis 5mg  refill request received. Pt is 69 years old, weight-149.9kg, Crea-1.40 on 01/15/2020, Diagnosis-Afib, and last seen by on Dr. [name] on 3/19/20201. Dose is appropriate based on dosing criteria. Will send in refill to requested pharmacy.

## 2020-02-15 NOTE — Progress Notes (Signed)
EPIC Encounter for ICM Monitoring  Patient Name: Nicholas Foley is a 69 y.o. male Date: 02/15/2020 Primary Care Physican: [name], MD Primary Cardiologist:Smith/Bensimhon Electrophysiologist:Allred Bi-V Pacing:97.3% 3/19/2021Weight: 330lbs (office visit)  Clinical Status (05-Feb-2020 to 11-Feb-2020)  Time in AT/AF  <0.1 hr/day (<0.1%)    Transmission reviewed.  Per Dr Darliss Ridgel 3/19 office note, he does not think patient is overloaded but thinks dyspnea is related to deconditioning following COVID infection.  Optivolthoracic impedancereturned to normal.  Prescribed:  Furosemide80 mg take1.5tablets (120 mgtotal)twice a day.   Potassium 20 mEq take2tablets daily  Labs: 01/15/2020 Creatinine 1.40, BUN 19, Potassium 4.2, Sodium 139, GFR 52-60 12/27/2019 Creatinine 1.21, BUN 16, Potassium 3.8, Sodium 138, GFR >60,    BNP 137.8 12/20/2019 Creatinine 1.24, BUN 11, Potassium 4.0, Sodium 140, GFR 60-70, BNP 1,939 11/17/2019 Creatinine 1.25, BUN 14, Potassium 3.7, Sodium 133, GFR >60 10/04/2019 Creatinine 1.23, BUN 16, Potassium 3.6, Sodium 139, GFR >60 08/30/2019 Creatinine1.16, BUN21, Potassium4.4, Sodium140, [id number] 07/31/2019 Creatinine1.31, BUN20, Potassium4.6, Sodium141, [id number] A complete set of results can be found in Results Review.  Recommendations: None.   Follow-up plan: ICM clinic phone appointment on4/10/2020.91 day device clinic remote transmission6/12/2019.Office appt4/22/2021 withDr Bensimhon.  Copy of ICM check sent to Dr.Allred.  3 month ICM trend: 02/11/2020    1 Year ICM trend:       Rosalene Billings, RN 02/15/2020 2:56 PM

## 2020-03-03 ENCOUNTER — Ambulatory Visit (INDEPENDENT_AMBULATORY_CARE_PROVIDER_SITE_OTHER): Payer: BC Managed Care – PPO

## 2020-03-03 DIAGNOSIS — I5042 Chronic combined systolic (congestive) and diastolic (congestive) heart failure: Secondary | ICD-10-CM | POA: Diagnosis not present

## 2020-03-03 DIAGNOSIS — Z950 Presence of cardiac pacemaker: Secondary | ICD-10-CM | POA: Diagnosis not present

## 2020-03-03 NOTE — Progress Notes (Signed)
EPIC Encounter for ICM Monitoring  Patient Name: DARTH CRISMON is a 69 y.o. male Date: 03/03/2020 Primary Care Physican: [name], MD Primary Cardiologist:Smith/Bensimhon Electrophysiologist:Allred Bi-V Pacing:97.3% 3/19/2021Weight: 330lbs (office visit)  Time in AT/AF  <0.1 hr/day (<0.1%)    Spoke with patient and reports feeling well at this time.  Denies fluid symptoms.    Optivolthoracic impedancenormal.  Prescribed:  Furosemide80 mg take1tablet (80 mgtotal)twice a day.   Potassium 20 mEq take1tablet daily  Labs: 01/15/2020 Creatinine 1.40, BUN 19, Potassium 4.2, Sodium 139, GFR 52-60 12/27/2019 Creatinine 1.21, BUN 16, Potassium 3.8, Sodium 138, GFR >60, BNP 137.8 12/20/2019 Creatinine 1.24, BUN 11, Potassium 4.0, Sodium 140, GFR 60-70, BNP 1,939 11/17/2019 Creatinine 1.25, BUN 14, Potassium 3.7, Sodium 133, GFR >60 10/04/2019 Creatinine 1.23, BUN 16, Potassium 3.6, Sodium 139, GFR >60 08/30/2019 Creatinine1.16, BUN21, Potassium4.4, Sodium140, [id number] 07/31/2019 Creatinine1.31, BUN20, Potassium4.6, Sodium141, [id number] A complete set of results can be found in Results Review.  Recommendations: No changes and encouraged to call if experiencing any fluid symptoms.  Follow-up plan: ICM clinic phone appointment on5/17/2021.91 day device clinic remote transmission6/12/2019.Office appt4/22/2021 withDr Bensimhon.  Copy of ICM check sent to Dr.Allred.  3 month ICM trend: 03/03/2020    1 Year ICM trend:       Rosalene Billings, RN 03/03/2020 4:03 PM

## 2020-03-08 ENCOUNTER — Other Ambulatory Visit: Payer: Self-pay | Admitting: Nurse Practitioner

## 2020-03-10 ENCOUNTER — Other Ambulatory Visit: Payer: Self-pay | Admitting: *Deleted

## 2020-03-10 MED ORDER — APIXABAN 5 MG PO TABS: 5.0000 mg | ORAL_TABLET | Freq: Two times a day (BID) | ORAL | 1 refills | Status: DC

## 2020-03-10 NOTE — Telephone Encounter (Signed)
Prescription refill request for Eliquis received.  Last office visit: Nicholas Foley 02/08/2020 Scr: 1.40, 01/15/2020 Age: 69 yo Weight: 149.9 kg   Prescription refill sent.

## 2020-03-13 ENCOUNTER — Encounter (HOSPITAL_COMMUNITY): Payer: Self-pay | Admitting: Internal Medicine

## 2020-03-13 ENCOUNTER — Other Ambulatory Visit: Payer: Self-pay

## 2020-03-13 ENCOUNTER — Ambulatory Visit (HOSPITAL_COMMUNITY)
Admission: RE | Admit: 2020-03-13 | Discharge: 2020-03-13 | Disposition: A | Discharge status: Home / Self Care | Payer: BC Managed Care – PPO | Source: Ambulatory Visit | Attending: Internal Medicine | Admitting: Internal Medicine

## 2020-03-13 VITALS — BP 117/73 | HR 90 | Wt 333.8 lb

## 2020-03-13 DIAGNOSIS — I110 Hypertensive heart disease with heart failure: Secondary | ICD-10-CM | POA: Diagnosis not present

## 2020-03-13 DIAGNOSIS — Z8249 Family history of ischemic heart disease and other diseases of the circulatory system: Secondary | ICD-10-CM | POA: Diagnosis not present

## 2020-03-13 DIAGNOSIS — I480 Paroxysmal atrial fibrillation: Secondary | ICD-10-CM | POA: Diagnosis not present

## 2020-03-13 DIAGNOSIS — I428 Other cardiomyopathies: Secondary | ICD-10-CM | POA: Diagnosis not present

## 2020-03-13 DIAGNOSIS — Z79899 Other long term (current) drug therapy: Secondary | ICD-10-CM | POA: Diagnosis not present

## 2020-03-13 DIAGNOSIS — Z950 Presence of cardiac pacemaker: Secondary | ICD-10-CM | POA: Diagnosis not present

## 2020-03-13 DIAGNOSIS — I5042 Chronic combined systolic (congestive) and diastolic (congestive) heart failure: Secondary | ICD-10-CM | POA: Insufficient documentation

## 2020-03-13 DIAGNOSIS — Z7951 Long term (current) use of inhaled steroids: Secondary | ICD-10-CM | POA: Insufficient documentation

## 2020-03-13 DIAGNOSIS — Z825 Family history of asthma and other chronic lower respiratory diseases: Secondary | ICD-10-CM | POA: Insufficient documentation

## 2020-03-13 DIAGNOSIS — Z87891 Personal history of nicotine dependence: Secondary | ICD-10-CM | POA: Insufficient documentation

## 2020-03-13 DIAGNOSIS — G4733 Obstructive sleep apnea (adult) (pediatric): Secondary | ICD-10-CM | POA: Insufficient documentation

## 2020-03-13 DIAGNOSIS — Z7901 Long term (current) use of anticoagulants: Secondary | ICD-10-CM | POA: Insufficient documentation

## 2020-03-13 DIAGNOSIS — I447 Left bundle-branch block, unspecified: Secondary | ICD-10-CM | POA: Insufficient documentation

## 2020-03-13 DIAGNOSIS — Z6841 Body Mass Index (BMI) 40.0 and over, adult: Secondary | ICD-10-CM | POA: Insufficient documentation

## 2020-03-13 DIAGNOSIS — J449 Chronic obstructive pulmonary disease, unspecified: Secondary | ICD-10-CM | POA: Insufficient documentation

## 2020-03-13 LAB — BASIC METABOLIC PANEL
Anion gap: 10 (ref 5–15)
BUN: 18 mg/dL (ref 8–23)
CO2: 24 mmol/L (ref 22–32)
Calcium: 8.5 mg/dL — ABNORMAL LOW (ref 8.9–10.3)
Chloride: 104 mmol/L (ref 98–111)
Creatinine, Ser: 1.08 mg/dL (ref 0.61–1.24)
GFR calc Af Amer: >60 mL/min (ref >60)
GFR calc non Af Amer: >60 mL/min (ref >60)
Glucose, Bld: 112 mg/dL — ABNORMAL HIGH (ref 70–99)
Potassium: 4 mmol/L (ref 3.5–5.1)
Sodium: 138 mmol/L (ref 135–145)

## 2020-03-13 LAB — BRAIN NATRIURETIC PEPTIDE: B Natriuretic Peptide: 78.8 pg/mL (ref 0.0–100.0)

## 2020-03-13 MED ORDER — SPIRONOLACTONE 25 MG PO TABS: 25.0000 mg | ORAL_TABLET | Freq: Every day | ORAL | 3 refills | Status: DC

## 2020-03-13 MED ORDER — CARVEDILOL 6.25 MG PO TABS: 9.3750 mg | ORAL_TABLET | Freq: Two times a day (BID) | ORAL | 3 refills | Status: DC

## 2020-03-13 NOTE — Patient Instructions (Signed)
Increase Spironolactone to 25 mg (1 tab) daily  Increase Carvedilol to 9.375 mg (1 & 1/2 tabs) Twice daily   Take extra 1-2 doses of Furosemide as needed  Labs done today, we will notify you of abnormal results  Your physician recommends that you schedule a follow-up appointment in: 4-5 months  If you have any questions or concerns before your next appointment please send [place] a message through [place] or call our office at [phone].  At the [facility], you and your health needs are our priority. As part of our continuing mission to provide you with exceptional heart care, we have created designated Provider Care Teams. These Care Teams include your primary Cardiologist (physician) and Advanced Practice Providers (APPs- Physician Assistants and Nurse Practitioners) who all work together to provide you with the care you need, when you need it.   You may see any of the following providers on your designated Care Team at your next follow up: [name] Dr Glori Bickers . Dr Loralie Champagne . Darrick Grinder, NP . Lyda Jester, PA . Audry Riles, PharmD   Please be sure to bring in all your medications bottles to every appointment.

## 2020-03-13 NOTE — Progress Notes (Addendum)
Advanced Heart Failure Clinic Note   Date:  03/13/2020   ID:  Nicholas Foley, DOB Oct 21, 1951, MRN [id number]  Location: Home  Provider location: [facility] Type of Visit: Established patient  PCP:  [name], MD  Cardiologist:  [name], MD Primary HF: Zanai Mallari  Chief Complaint: Heart Failure follow-up    History of Present Illness:  Nicholas Foley is a 69 year old with a history of morbid obesity, chronic systolic heart failure, COPD, OSA, PAF, previously on amio-> stopped 2018,  HTN, LBBB, Medtronic CRT-P, and former smoker (quit 7-8 years ago).   He has h/o NICM which seems to date back to 2014. Had cardiac cath in 1/20 with no CAD and well compensated filling pressures.   Admitted in January 2020 for CRT-P device in setting of LBBB. Intially he felt a lot better but has gradually declined. Over the last month he has been having ongoing episodes with dyspnea and fatigue. Saw Dr. [name] who referred him here for further evaluation.   He got COVID in December 2020. Was sick but didn't get admitted. Did not need O2. Went back to work for a week. Then got worse. Saw [name] NP. Lasix increased from 80 bid to 160/80 for 3 days. Had better urine output and breathing some better. Had CXR which showed PNA and was placed on abx. However still SOB and fatigued. No orthopnea or PND. No fevers or chills. Had AHF f/u 2/4 and was volume overloaded on exam any by Optivol. His lasix was increased to 120 mg bid for several days. He had return f/u with EP on 2/23 and volume improved. Optivol improved. Lasix was reduced back down to original dose of 80 mg daily. Labs were repeated and SCr and K stable.   He returns for f/u. Says he is doing pretty well. Back to work FT. Got 2nd COVID shot Tuesday and had chills. Very thirsty. Drinking a lot. Weight up. Feels more bloated. + DOE. No edema.   Echo 01/30/20 EF 45-50% (read as 35-40% which I disagree  with)  ICD: No VT/AF Fluid trending up but not above threshold 100% biv pacing Personally reviewed   Cardiac Studies CPX 09/18/19  Limited by obesity and mild heart failure Peak VO2 15.1  (80% predicted peak VO2) - when corrected to ibw pVO2 27.5 Slope 33 RER 1.02   LHC/RHC 11/2018 No coronary disease.  RA 13 PA 37/20 (27)  PCWP 14 CO 8.5 CI 3.3   ECHO 09/27/2019 -RV normal EF 45-50%  ECHO 09/2018 -EF 35-40% Grade II DD      Nicholas Foley denies symptoms worrisome for COVID 19.   Past Medical History:  Diagnosis Date  . Atrial fibrillation with rapid ventricular response ([place]) 08/02/2013  . CHF (congestive heart failure) ([place])   . Chronic systolic dysfunction of left ventricle 08/02/2013   BNP greater than 2000   . COPD (chronic obstructive pulmonary disease) ([facility])    Moderate by PFTs with response to bronchodilators, May 2014  . Erectile dysfunction   . Hypertension   . Insomnia 08/02/13  . Left bundle branch block 08/02/2013  . Morbid obesity ([place]) 08/02/2013  . OSA (obstructive sleep apnea)    severe OSA with AHI 74/hr now on CPAP at 8cm H2O   Past Surgical History:  Procedure Laterality Date  . BIV PACEMAKER INSERTION CRT-P N/A 12/14/2018   Medtronic model [id number] Percepta Quad CRT-P MRI Surescan (serial Number  [id number]) device implanted by Dr [name] for CHF and LBBB  . CARDIOVERSION N/A 08/06/2013   Procedure: CARDIOVERSION;  Surgeon: Lelon Perla, MD;  Location: [facility] ENDOSCOPY;  Service: Cardiovascular;  Laterality: N/A;  spoke with [name]   . CARDIOVERSION N/A 09/03/2013   Procedure: CARDIOVERSION;  Surgeon: [name], MD;  Location: [facility] ENDOSCOPY;  Service: Cardiovascular;  Laterality: N/A;  . chronic systolic dysfu    . NASAL SEPTUM SURGERY    . RIGHT/LEFT HEART CATH AND CORONARY ANGIOGRAPHY N/A 12/04/2018   Procedure: RIGHT/LEFT HEART CATH AND CORONARY ANGIOGRAPHY;  Surgeon: Belva Crome, MD;  Location: [facility] CV LAB;  Service:  Cardiovascular;  Laterality: N/A;  . TEE WITHOUT CARDIOVERSION N/A 08/06/2013   Procedure: TRANSESOPHAGEAL ECHOCARDIOGRAM (TEE);  Surgeon: Lelon Perla, MD;  Location: [facility] ENDOSCOPY;  Service: Cardiovascular;  Laterality: N/A;  . VASECTOMY       Current Outpatient Medications  Medication Sig Dispense Refill  . acetaminophen (TYLENOL) 650 MG CR tablet Take 1,300 mg by mouth every 8 (eight) hours as needed for pain.     [name] ADVAIR DISKUS 500-50 MCG/DOSE AEPB Inhale 500 each into the lungs 2 (two) times daily.    [name] apixaban (ELIQUIS) 5 MG TABS tablet Take 1 tablet (5 mg total) by mouth 2 (two) times daily. 180 tablet 1  . carvedilol (COREG) 6.25 MG tablet TAKE 1 TABLET(6.25 MG) BY MOUTH TWICE DAILY 60 tablet 11  . cholecalciferol (VITAMIN D3) 25 MCG (1000 UT) tablet Take 1,000 Units by mouth daily.    [name] CIALIS 20 MG tablet Take 20 mg by mouth daily as needed for erectile dysfunction.   0  . cyclobenzaprine (FLEXERIL) 10 MG tablet Take 10 mg by mouth as needed.    . fluticasone (FLONASE) 50 MCG/ACT nasal spray Place 1 spray into both nostrils 2 (two) times daily.   0  . furosemide (LASIX) 40 MG tablet Take 80 mg by mouth 2 (two) times daily.    [name] omeprazole (PRILOSEC OTC) 20 MG tablet Take 20 mg by mouth daily.    . potassium chloride SA (KLOR-CON) 20 MEQ tablet Take 1 tablet (20 mEq total) by mouth daily. 90 tablet 3  . sacubitril-valsartan (ENTRESTO) 97-103 MG Take 1 tablet by mouth 2 (two) times daily. 60 tablet 6  . spironolactone (ALDACTONE) 25 MG tablet Take 0.5 tablets (12.5 mg total) by mouth daily. 45 tablet 3  . vitamin B-12 (CYANOCOBALAMIN) 1000 MCG tablet Take 1,000 mcg by mouth daily.    [name] zolpidem (AMBIEN CR) 12.5 MG CR tablet Take 1 tablet by mouth at bedtime as needed for sleep.      No current facility-administered medications for this encounter.    Allergies:   Patient has no known allergies.   Social History:  The patient  reports that he quit smoking about 7 years ago.  His smoking use included cigarettes and cigars. He has a 64.50 pack-year smoking history. He has never used smokeless tobacco. He reports current alcohol use. He reports that he does not use drugs.   Family History:  The patient's family history includes Atrial fibrillation in his mother; COPD in his father; Cerebral aneurysm in his brother; Healthy in his daughter.   ROS:  Please see the history of present illness.   All other systems are personally reviewed and negative.   Vitals:   03/13/20 1331  BP: 117/73  Pulse: 90  SpO2: 96%    PHYSICAL EXAM: General: Obese Well appearing. No  resp difficulty HEENT: normal Neck: supple. no JVD. Carotids 2+ bilat; no bruits. No lymphadenopathy or thryomegaly appreciated. Cor: PMI nondisplaced. Regular rate & rhythm. No rubs, gallops or murmurs. Lungs: clear Abdomen: obese soft, nontender, nondistended. No hepatosplenomegaly. No bruits or masses. Good bowel sounds. Extremities: no cyanosis, clubbing, rash, edema Neuro: alert & orientedx3, cranial nerves grossly intact. moves all 4 extremities w/o difficulty. Affect pleasant .    Recent Labs: 12/20/2019: ALT 28; Hemoglobin 14.6; NT-Pro BNP 1,939; Platelets 236 12/27/2019: B Natriuretic Peptide 137.8 01/15/2020: BUN 19; Creatinine, Ser 1.40; Magnesium 2.1; Potassium 4.2; Sodium 139  Personally reviewed   Wt Readings from Last 3 Encounters:  03/13/20 (!) 151.4 kg (333 lb 12.8 oz)  02/08/20 (!) 149.9 kg (330 lb 6.4 oz)  01/24/20 (!) 149.3 kg (329 lb 4 oz)      ASSESSMENT AND PLAN:  1. Chronic Combined Systolic/Diastolic HF - NICM (thought due to LBBB). 12/2018 LHC no coronary disease.  - s/p Medtronic CRT-P - ECHO 11/2017 EF 35-40%. ECHO 11/19 EF 45-50%.  - Echo 11/20 read as 35-40% but with Definity I felt closed to 45-50% - Echo 3/21 EF read as 35-40% I think 45-50% - CPX test suboptimal effort mostly limited to obesity  - Pacer checked personally in clinic - no AF/VT. 100% bivpacing.  Optivol mildly elevated but not crossing threshold.  - Stable NYHA II. Volume status mildly up on edevice - Continue Lasix 80 bid - can take 11-2 extra doses as needed - Increase carvedilol to 9.375 mg twice a day - Continue Entresto 97-103 mg twice a day.  - Increase spiro to 25 mg daily.  - EF not low enough for SGLT2i   2. PAF  - In the past he was on amiodarone. Stopped ~2017 - No AF on device today - Continue Eliquis 5 bid. No bleeding  3. OSA - Continue CPAP  4. Obesity -  Body mass index is 46.56 kg/m.  5. COPD  - no longer smoking    Signed, Glori Bickers, MD  03/13/2020 1:41 PM  Advanced Heart Failure [place] [address] [facility] and [place] [address] [phone] (office) [phone] (fax)

## 2020-04-01 ENCOUNTER — Other Ambulatory Visit: Payer: Self-pay | Admitting: Family Medicine

## 2020-04-01 DIAGNOSIS — J449 Chronic obstructive pulmonary disease, unspecified: Secondary | ICD-10-CM | POA: Diagnosis not present

## 2020-04-01 DIAGNOSIS — B948 Sequelae of other specified infectious and parasitic diseases: Secondary | ICD-10-CM

## 2020-04-01 DIAGNOSIS — R0609 Other forms of dyspnea: Secondary | ICD-10-CM

## 2020-04-01 DIAGNOSIS — Z8616 Personal history of COVID-19: Secondary | ICD-10-CM | POA: Diagnosis not present

## 2020-04-07 DIAGNOSIS — G4733 Obstructive sleep apnea (adult) (pediatric): Secondary | ICD-10-CM | POA: Diagnosis not present

## 2020-04-09 ENCOUNTER — Ambulatory Visit: Payer: BC Managed Care – PPO

## 2020-04-09 DIAGNOSIS — I5042 Chronic combined systolic (congestive) and diastolic (congestive) heart failure: Secondary | ICD-10-CM

## 2020-04-09 DIAGNOSIS — Z950 Presence of cardiac pacemaker: Secondary | ICD-10-CM

## 2020-04-11 ENCOUNTER — Telehealth: Payer: Self-pay

## 2020-04-11 NOTE — Progress Notes (Signed)
EPIC Encounter for ICM Monitoring  Patient Name: Nicholas Foley is a 69 y.o. male Date: 04/11/2020 Primary Care Physican: [name], MD Primary Cardiologist:Smith/Bensimhon Electrophysiologist:Allred Bi-V Pacing:97.5% 4/22/2021Weight: 333lbs (office visit)  Time in AT/AF  <0.1 hr/day (<0.1%)    Attempted call to patient and unable to reach.  Left detailed message per DPR regarding transmission. Transmission reviewed.   Optivolthoracic impedancenormal.  Prescribed:  Furosemide80 mg take1tablet (80 mgtotal)twice a day. (4/22 OV note by Dr Haroldine Laws says he can take 1-2 extra as needed)  Potassium 20 mEq take1tablet daily  Labs: 03/13/2020 Creatinine  01/15/2020 Creatinine 1.40, BUN 19, Potassium 4.2, Sodium 139, GFR 52-60 12/27/2019 Creatinine 1.21, BUN 16, Potassium 3.8, Sodium 138, GFR >60, BNP 137.8 A complete set of results can be found in Results Review.  Recommendations:Left voice mail with ICM number and encouraged to call if experiencing any fluid symptoms.  Follow-up plan: ICM clinic phone appointment on6/21/2021.91 day device clinic remote transmission6/12/2019.Office appt8/23/2021 withDr Bensimhon.  Copy of ICM check sent to Dr.Allred.  3 month ICM trend: 04/08/2020    1 Year ICM trend:       Rosalene Billings, RN 04/11/2020 10:08 AM

## 2020-04-11 NOTE — Telephone Encounter (Signed)
Remote ICM transmission received.  Attempted call to patient regarding ICM remote transmission and left detailed message per DPR.  Advised to return call for any fluid symptoms or questions. Next ICM remote transmission scheduled 05/12/2020.     

## 2020-04-16 ENCOUNTER — Other Ambulatory Visit (HOSPITAL_COMMUNITY): Payer: Self-pay | Admitting: Adult Health

## 2020-04-17 ENCOUNTER — Other Ambulatory Visit: Payer: Self-pay

## 2020-04-17 ENCOUNTER — Ambulatory Visit
Admission: RE | Admit: 2020-04-17 | Discharge: 2020-04-17 | Disposition: A | Discharge status: Home / Self Care | Payer: BC Managed Care – PPO | Source: Ambulatory Visit | Attending: Family Medicine | Admitting: Family Medicine

## 2020-04-17 DIAGNOSIS — R0609 Other forms of dyspnea: Secondary | ICD-10-CM

## 2020-04-17 DIAGNOSIS — J439 Emphysema, unspecified: Secondary | ICD-10-CM | POA: Diagnosis not present

## 2020-04-17 DIAGNOSIS — B948 Sequelae of other specified infectious and parasitic diseases: Secondary | ICD-10-CM

## 2020-04-17 MED ORDER — IOPAMIDOL (ISOVUE-300) INJECTION 61%
75.0000 mL | Freq: Once | INTRAVENOUS | Status: AC | PRN
  Administered 2020-04-17: 75 mL via INTRAVENOUS

## 2020-04-23 ENCOUNTER — Ambulatory Visit (INDEPENDENT_AMBULATORY_CARE_PROVIDER_SITE_OTHER): Payer: BC Managed Care – PPO | Admitting: *Deleted

## 2020-04-23 DIAGNOSIS — I428 Other cardiomyopathies: Secondary | ICD-10-CM | POA: Diagnosis not present

## 2020-04-23 DIAGNOSIS — I447 Left bundle-branch block, unspecified: Secondary | ICD-10-CM

## 2020-04-24 LAB — CUP PACEART REMOTE DEVICE CHECK
Battery Remaining Longevity: 135 mo
Battery Voltage: 3.02 V
Brady Statistic AP VP Percent: 0.05 %
Brady Statistic AP VS Percent: 0.02 %
Brady Statistic AS VP Percent: 98.05 %
Brady Statistic AS VS Percent: 1.89 %
Brady Statistic RA Percent Paced: 0.07 %
Brady Statistic RV Percent Paced: 15.21 %
Date Time Interrogation Session: 20210601210502
Implantable Lead Implant Date: 20200123
Implantable Lead Implant Date: 20200123
Implantable Lead Implant Date: 20200123
Implantable Lead Location: 753858
Implantable Lead Location: 753859
Implantable Lead Location: 753860
Implantable Lead Model: 4598
Implantable Lead Model: 5076
Implantable Lead Model: 5076
Implantable Pulse Generator Implant Date: 20200123
Lead Channel Impedance Value: 1235 Ohm
Lead Channel Impedance Value: 1235 Ohm
Lead Channel Impedance Value: 1254 Ohm
Lead Channel Impedance Value: 1292 Ohm
Lead Channel Impedance Value: 1558 Ohm
Lead Channel Impedance Value: 1577 Ohm
Lead Channel Impedance Value: 361 Ohm
Lead Channel Impedance Value: 475 Ohm
Lead Channel Impedance Value: 532 Ohm
Lead Channel Impedance Value: 551 Ohm
Lead Channel Impedance Value: 570 Ohm
Lead Channel Impedance Value: 855 Ohm
Lead Channel Impedance Value: 874 Ohm
Lead Channel Impedance Value: 893 Ohm
Lead Channel Pacing Threshold Amplitude: 0.5 V
Lead Channel Pacing Threshold Amplitude: 0.875 V
Lead Channel Pacing Threshold Amplitude: 1.625 V
Lead Channel Pacing Threshold Pulse Width: 0.4 ms
Lead Channel Pacing Threshold Pulse Width: 0.4 ms
Lead Channel Pacing Threshold Pulse Width: 0.5 ms
Lead Channel Sensing Intrinsic Amplitude: 0.875 mV
Lead Channel Sensing Intrinsic Amplitude: 0.875 mV
Lead Channel Sensing Intrinsic Amplitude: 9.75 mV
Lead Channel Sensing Intrinsic Amplitude: 9.75 mV
Lead Channel Setting Pacing Amplitude: 1.75 V
Lead Channel Setting Pacing Amplitude: 2.25 V
Lead Channel Setting Pacing Amplitude: 2.5 V
Lead Channel Setting Pacing Pulse Width: 0.4 ms
Lead Channel Setting Pacing Pulse Width: 0.5 ms
Lead Channel Setting Sensing Sensitivity: 2 mV

## 2020-04-25 NOTE — Progress Notes (Signed)
Remote pacemaker transmission.   

## 2020-04-27 IMAGING — DX DG CHEST 2V
2 series · 2 of 2 positions shown · non-contrast
Comparison: Chest x-ray dated March 01, 2017.

CLINICAL DATA: Preoperative study for cardiac catheterization.
Worsening shortness of breath.

EXAM:
CHEST - 2 VIEW

[dg chest 2 view (1 of 2)]
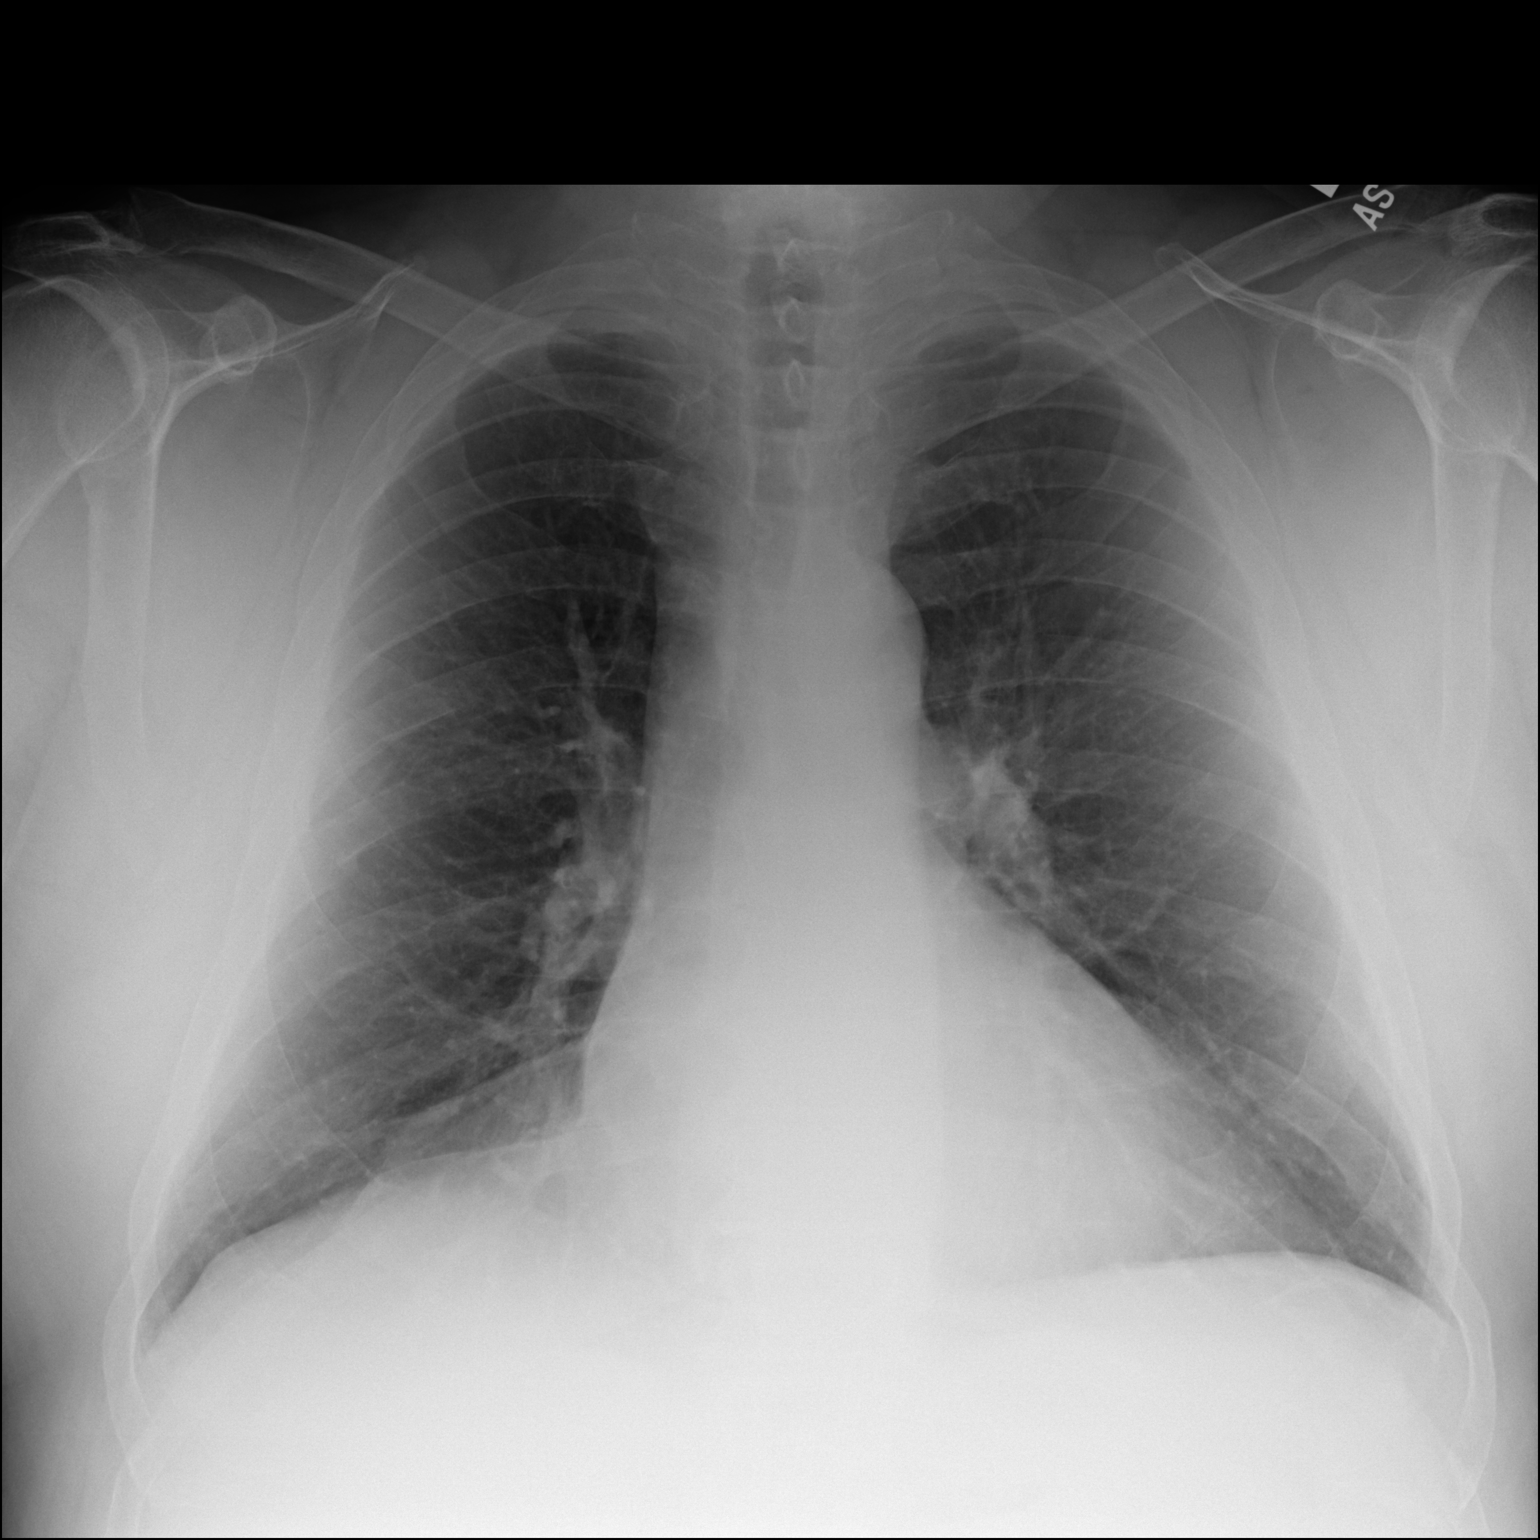

[dg chest 2 view (2 of 2)]
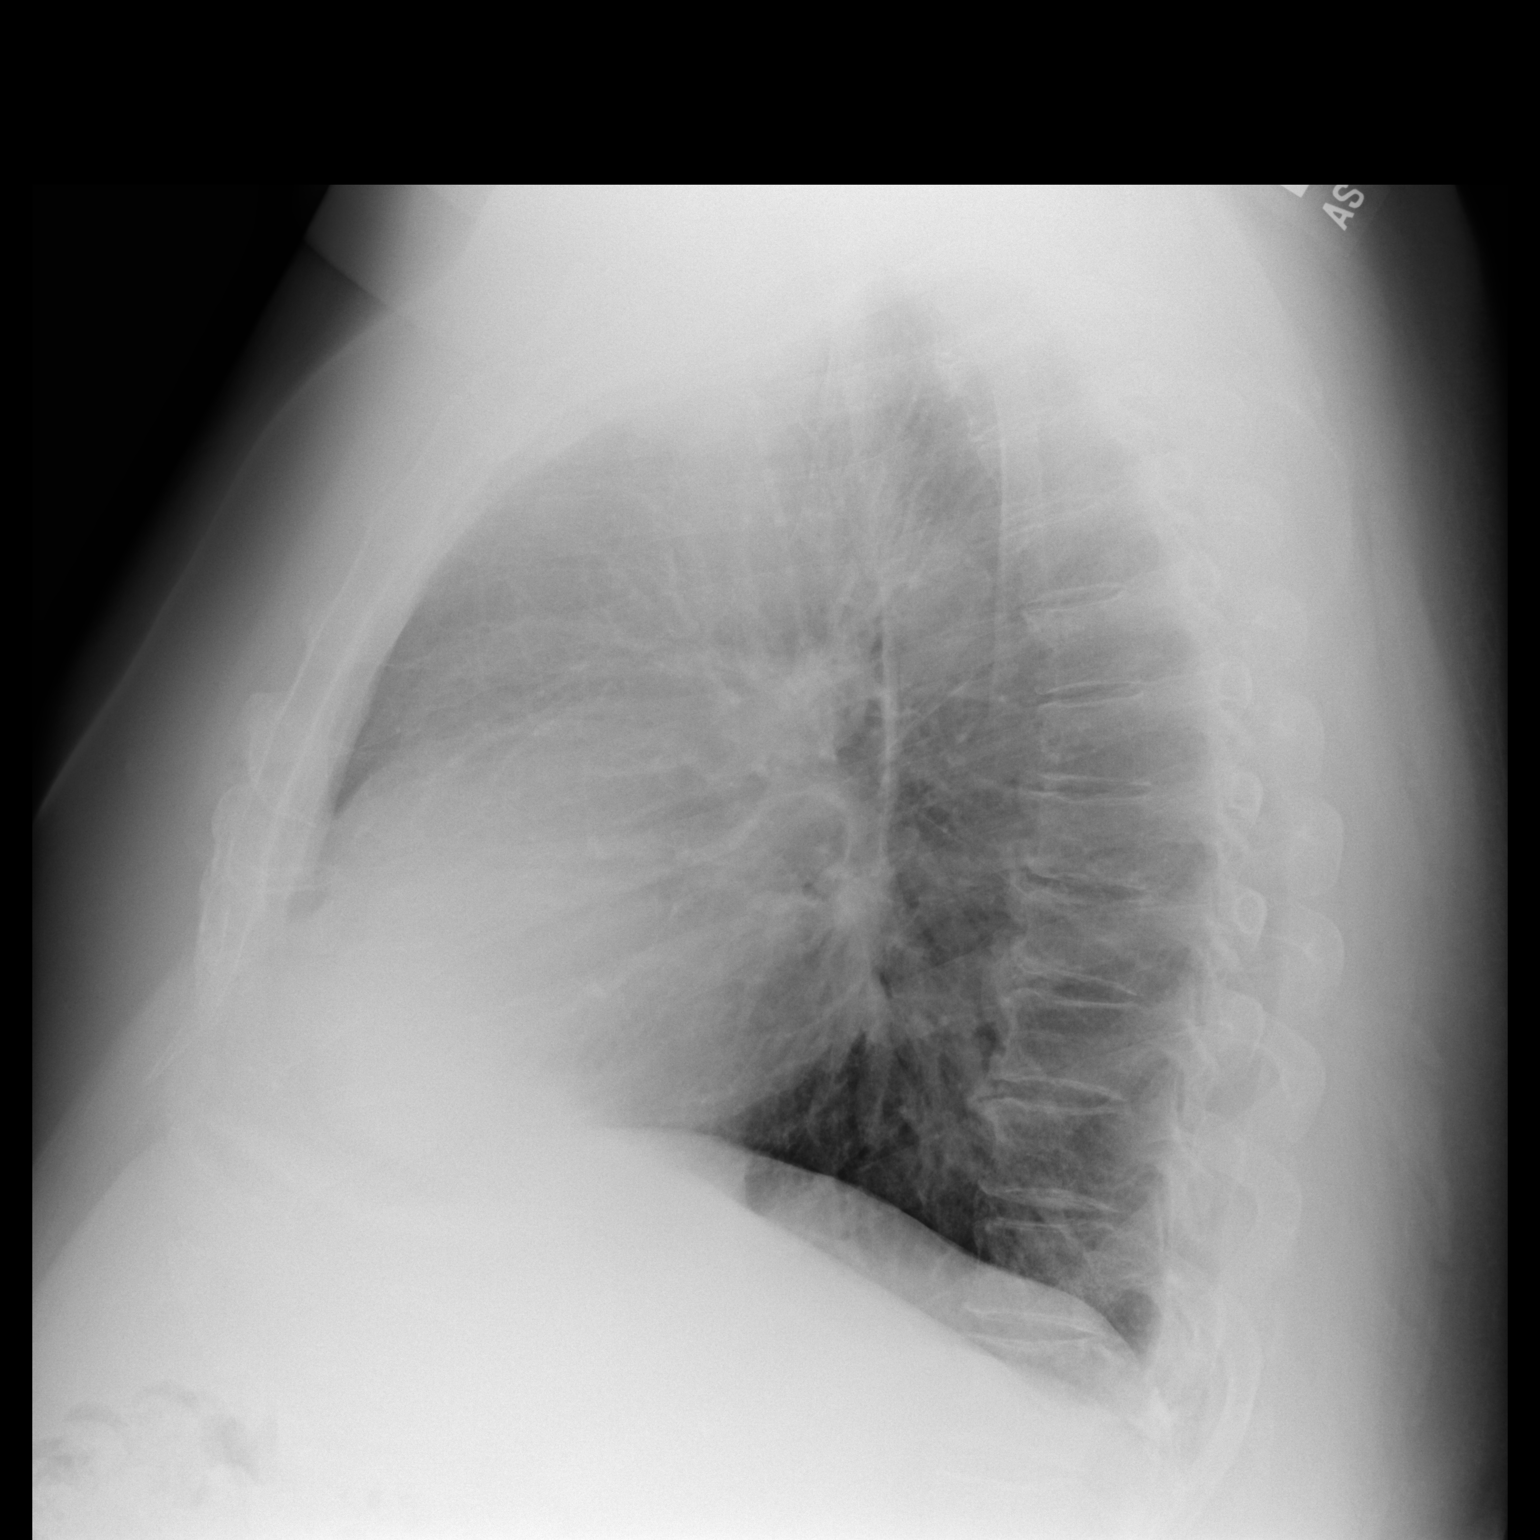

[2 of 2 positions shown; findings below may reference images not displayed]

FINDINGS: The heart remains at the upper limits of normal in size. Normal
mediastinal contours. Normal pulmonary vascularity. No focal
consolidation, pleural effusion, or pneumothorax. No acute osseous
abnormality.
IMPRESSION: No active cardiopulmonary disease.

## 2020-05-12 ENCOUNTER — Ambulatory Visit (INDEPENDENT_AMBULATORY_CARE_PROVIDER_SITE_OTHER): Payer: BC Managed Care – PPO

## 2020-05-12 DIAGNOSIS — Z950 Presence of cardiac pacemaker: Secondary | ICD-10-CM

## 2020-05-12 DIAGNOSIS — I5042 Chronic combined systolic (congestive) and diastolic (congestive) heart failure: Secondary | ICD-10-CM

## 2020-05-16 NOTE — Progress Notes (Signed)
EPIC Encounter for ICM Monitoring  Patient Name: Nicholas Foley is a 69 y.o. male Date: 05/16/2020 Primary Care Physican: [name], MD Primary Cardiologist:Smith/Bensimhon Electrophysiologist:Allred Bi-V Pacing:99.7% 6/25/2021Weight: 333lbs (office visit)  Time in AT/AF <0.1 hr/day (<0.1%)    Spoke with patient and reports feeling well at this time.  Denies fluid symptoms.    Optivolthoracic impedancenormal.  Prescribed:  Furosemide80 mg take1tablet (80 mgtotal)twice a day. (4/22 OV note by Dr Haroldine Laws says he can take 1-2 extra as needed)  Potassium 20 mEq take1tablet daily  Labs: 03/13/2020 Creatinine 1.08, BUN 18, Potassium 4.0, Sodium 138, GFR >60 01/15/2020 Creatinine 1.40, BUN 19, Potassium 4.2, Sodium 139, GFR 52-60 12/27/2019 Creatinine 1.21, BUN 16, Potassium 3.8, Sodium 138, GFR >60, BNP 137.8 A complete set of results can be found in Results Review.  Recommendations:No changes and encouraged to call if experiencing any fluid symptoms.  Follow-up plan: ICM clinic phone appointment on7/26/2021.91 day device clinic remote transmission9/11/2019.Office appt8/23/2021 withDr Bensimhon.  Copy of ICM check sent to Dr.Allred.  3 month ICM trend: 05/12/2020    1 Year ICM trend:       Rosalene Billings, RN 05/16/2020 10:22 AM

## 2020-06-06 DIAGNOSIS — G4733 Obstructive sleep apnea (adult) (pediatric): Secondary | ICD-10-CM | POA: Diagnosis not present

## 2020-06-16 ENCOUNTER — Ambulatory Visit (INDEPENDENT_AMBULATORY_CARE_PROVIDER_SITE_OTHER): Payer: BC Managed Care – PPO

## 2020-06-16 DIAGNOSIS — Z950 Presence of cardiac pacemaker: Secondary | ICD-10-CM | POA: Diagnosis not present

## 2020-06-16 DIAGNOSIS — I5042 Chronic combined systolic (congestive) and diastolic (congestive) heart failure: Secondary | ICD-10-CM

## 2020-06-18 NOTE — Progress Notes (Signed)
EPIC Encounter for ICM Monitoring  Patient Name: Nicholas Foley is a 69 y.o. male Date: 06/18/2020 Primary Care Physican: [name], MD Primary Cardiologist:Smith/Bensimhon Electrophysiologist:Allred Bi-V Pacing:97.8% 6/25/2021Weight: 333lbs (office visit)  Time in AT/AF <0.1 hr/day (<0.1%)    Spoke with patient and reports feeling well at this time.  Denies fluid symptoms.  He had some return COVID symptoms a week ago but fine this week.  Optivolthoracic impedancenormal.  Prescribed:  Furosemide80 mg take1tablet (80 mgtotal)twice a day.(4/22 OV note by Dr Haroldine Laws says he can take 1-2 extra as needed)  Potassium 20 mEq take1tablet daily  Labs: 03/13/2020 Creatinine1.08, BUN 18, Potassium 4.0, Sodium 138, GFR >60 01/15/2020 Creatinine 1.40, BUN 19, Potassium 4.2, Sodium 139, GFR 52-60 12/27/2019 Creatinine 1.21, BUN 16, Potassium 3.8, Sodium 138, GFR >60, BNP 137.8 A complete set of results can be found in Results Review.  Recommendations: No changes and encouraged to call if experiencing any fluid symptoms.  Follow-up plan: ICM clinic phone appointment on9/12/2019.91 day device clinic remote transmission9/11/2019.    EP/Cardiology Office Visits: 07/14/2020 with Dr. Haroldine Laws.  Recalls for 08/06/2020 with Dr [name] and 01/14/2021 with Dr [name].  Copy of ICM check sent to Dr. [name].   3 month ICM trend: 06/15/2020    1 Year ICM trend:       Rosalene Billings, RN 06/18/2020 11:05 AM

## 2020-06-30 ENCOUNTER — Encounter (INDEPENDENT_AMBULATORY_CARE_PROVIDER_SITE_OTHER): Payer: Managed Care, Other (non HMO) | Admitting: Ophthalmology

## 2020-07-07 ENCOUNTER — Other Ambulatory Visit (HOSPITAL_COMMUNITY): Payer: Self-pay | Admitting: Internal Medicine

## 2020-07-09 DIAGNOSIS — B948 Sequelae of other specified infectious and parasitic diseases: Secondary | ICD-10-CM | POA: Diagnosis not present

## 2020-07-09 DIAGNOSIS — R5382 Chronic fatigue, unspecified: Secondary | ICD-10-CM | POA: Diagnosis not present

## 2020-07-14 ENCOUNTER — Inpatient Hospital Stay (HOSPITAL_COMMUNITY)
Admission: RE | Admit: 2020-07-14 | Payer: BC Managed Care – PPO | Source: Ambulatory Visit | Admitting: Internal Medicine

## 2020-07-16 ENCOUNTER — Encounter (INDEPENDENT_AMBULATORY_CARE_PROVIDER_SITE_OTHER): Payer: Self-pay | Admitting: Ophthalmology

## 2020-07-23 ENCOUNTER — Ambulatory Visit (INDEPENDENT_AMBULATORY_CARE_PROVIDER_SITE_OTHER): Payer: BC Managed Care – PPO | Admitting: *Deleted

## 2020-07-23 DIAGNOSIS — I428 Other cardiomyopathies: Secondary | ICD-10-CM

## 2020-07-23 LAB — CUP PACEART REMOTE DEVICE CHECK
Battery Remaining Longevity: 128 mo
Battery Voltage: 3.02 V
Brady Statistic AP VP Percent: 0.24 %
Brady Statistic AP VS Percent: 0.02 %
Brady Statistic AS VP Percent: 96.41 %
Brady Statistic AS VS Percent: 3.33 %
Brady Statistic RA Percent Paced: 0.26 %
Brady Statistic RV Percent Paced: 14.39 %
Date Time Interrogation Session: 20210901005259
Implantable Lead Implant Date: 20200123
Implantable Lead Implant Date: 20200123
Implantable Lead Implant Date: 20200123
Implantable Lead Location: 753858
Implantable Lead Location: 753859
Implantable Lead Location: 753860
Implantable Lead Model: 4598
Implantable Lead Model: 5076
Implantable Lead Model: 5076
Implantable Pulse Generator Implant Date: 20200123
Lead Channel Impedance Value: 1045 Ohm
Lead Channel Impedance Value: 1083 Ohm
Lead Channel Impedance Value: 1311 Ohm
Lead Channel Impedance Value: 1520 Ohm
Lead Channel Impedance Value: 1539 Ohm
Lead Channel Impedance Value: 361 Ohm
Lead Channel Impedance Value: 380 Ohm
Lead Channel Impedance Value: 418 Ohm
Lead Channel Impedance Value: 456 Ohm
Lead Channel Impedance Value: 532 Ohm
Lead Channel Impedance Value: 722 Ohm
Lead Channel Impedance Value: 760 Ohm
Lead Channel Impedance Value: 969 Ohm
Lead Channel Impedance Value: 969 Ohm
Lead Channel Pacing Threshold Amplitude: 0.375 V
Lead Channel Pacing Threshold Amplitude: 0.625 V
Lead Channel Pacing Threshold Amplitude: 1.75 V
Lead Channel Pacing Threshold Pulse Width: 0.4 ms
Lead Channel Pacing Threshold Pulse Width: 0.4 ms
Lead Channel Pacing Threshold Pulse Width: 0.5 ms
Lead Channel Sensing Intrinsic Amplitude: 1.25 mV
Lead Channel Sensing Intrinsic Amplitude: 1.25 mV
Lead Channel Sensing Intrinsic Amplitude: 7.875 mV
Lead Channel Sensing Intrinsic Amplitude: 7.875 mV
Lead Channel Setting Pacing Amplitude: 1.5 V
Lead Channel Setting Pacing Amplitude: 2.25 V
Lead Channel Setting Pacing Amplitude: 2.5 V
Lead Channel Setting Pacing Pulse Width: 0.4 ms
Lead Channel Setting Pacing Pulse Width: 0.5 ms
Lead Channel Setting Sensing Sensitivity: 2 mV

## 2020-07-24 ENCOUNTER — Ambulatory Visit (INDEPENDENT_AMBULATORY_CARE_PROVIDER_SITE_OTHER): Payer: BC Managed Care – PPO

## 2020-07-24 DIAGNOSIS — I5042 Chronic combined systolic (congestive) and diastolic (congestive) heart failure: Secondary | ICD-10-CM | POA: Diagnosis not present

## 2020-07-24 DIAGNOSIS — Z950 Presence of cardiac pacemaker: Secondary | ICD-10-CM

## 2020-07-25 NOTE — Progress Notes (Signed)
EPIC Encounter for ICM Monitoring  Patient Name: Nicholas Foley is a 69 y.o. male Date: 07/25/2020 Primary Care Physican: [name], MD Primary Cardiologist:Smith/Bensimhon Electrophysiologist:Allred Bi-V Pacing:96.8% 4/22/2021Office Weight: 333lbs (office visit)  Time in AT/AF (0.0%)   Spoke with patient and reports feeling well at this time.  Denies fluid symptoms.  He had a virus over the last week but is feeling better.   Optivolthoracic impedancenormal.  Prescribed:  Furosemide80 mg take1tablet (80 mgtotal)twice a day.(4/22 OV note by Dr Haroldine Laws says he can take 1-2 extra as needed)  Potassium 20 mEq take1tablet daily  Labs: 03/13/2020 Creatinine1.08, BUN 18, Potassium 4.0, Sodium 138, GFR >60 01/15/2020 Creatinine 1.40, BUN 19, Potassium 4.2, Sodium 139, GFR 52-60 12/27/2019 Creatinine 1.21, BUN 16, Potassium 3.8, Sodium 138, GFR >60, BNP 137.8 A complete set of results can be found in Results Review.  Recommendations: No changes and encouraged to call if experiencing any fluid symptoms.  Follow-up plan: ICM clinic phone appointment on10/02/2020.91 day device clinic remote transmission9/11/2019.    EP/Cardiology Office Visits: 08/27/2020 with Dr [name].  09/05/2020 with Dr. Haroldine Laws.  Recall for 01/14/2021 with Dr [name].  Copy of ICM check sent to Dr. [name].   3 month ICM trend: 07/23/2020    1 Year ICM trend:       Rosalene Billings, RN 07/25/2020 2:51 PM

## 2020-07-25 NOTE — Progress Notes (Signed)
Remote pacemaker transmission.   

## 2020-08-01 DIAGNOSIS — H2511 Age-related nuclear cataract, right eye: Secondary | ICD-10-CM | POA: Diagnosis not present

## 2020-08-01 DIAGNOSIS — H2513 Age-related nuclear cataract, bilateral: Secondary | ICD-10-CM | POA: Diagnosis not present

## 2020-08-06 ENCOUNTER — Telehealth: Payer: Self-pay | Admitting: *Deleted

## 2020-08-06 NOTE — Telephone Encounter (Signed)
   Primary Cardiologist: [name], MD  Chart reviewed as part of pre-operative protocol coverage. Cataract extractions are recognized in guidelines as low risk surgeries that do not typically require specific preoperative testing or holding of blood thinner therapy. Therefore, given past medical history and time since last visit, based on ACC/AHA guidelines, Nicholas Foley would be at acceptable risk for the planned procedure without further cardiovascular testing.   I will route this recommendation to the requesting party via Epic fax function and remove from pre-op pool.  Please call with questions.  Abigail Butts, PA-C 08/06/2020, 11:13 AM

## 2020-08-06 NOTE — Telephone Encounter (Signed)
   [facility] Pre-operative Risk Assessment    HEARTCARE STAFF: - Please ensure there is not already an duplicate clearance open for this procedure. - Under Visit Info/Reason for Call, type in Other and utilize the format Clearance MM/DD/YY or Clearance TBD. Do not use dashes or single digits. - If request is for dental extraction, please clarify the # of teeth to be extracted.  Request for surgical clearance:  1. What type of surgery is being performed?  CATARACT EXTRACTION W/ INTRAOCULAR LENS OF RIGHT EYE FOLLOWED BY THE LEFT EYE   2. When is this surgery scheduled?  08/28/20   3. What type of clearance is required (medical clearance vs. Pharmacy clearance to hold med vs. Both)? BOTH   4. Are there any medications that need to be held prior to surgery and how long? ELIQUIS   5. Practice name and name of physician performing surgery?   [place] / DR. SUN  6. What is the office phone number?  [phone]   7.   What is the office fax number?  [phone]  8.   Anesthesia type (None, local, MAC, general) ?     Jeanann Lewandowsky 08/06/2020, 11:06 AM  _________________________________________________________________   (provider comments below)

## 2020-08-25 ENCOUNTER — Ambulatory Visit (INDEPENDENT_AMBULATORY_CARE_PROVIDER_SITE_OTHER): Payer: BC Managed Care – PPO

## 2020-08-25 DIAGNOSIS — I5042 Chronic combined systolic (congestive) and diastolic (congestive) heart failure: Secondary | ICD-10-CM

## 2020-08-25 DIAGNOSIS — Z950 Presence of cardiac pacemaker: Secondary | ICD-10-CM

## 2020-08-25 NOTE — Progress Notes (Signed)
EPIC Encounter for ICM Monitoring  Patient Name: Nicholas Foley is a 69 y.o. male Date: 08/25/2020 Primary Care Physican: [name], MD Primary Cardiologist:Smith/Bensimhon Electrophysiologist:Allred Bi-V Pacing:96.1% 4/22/2021Office Weight: 333lbs (does not weigh at home)  NSVT (>4 beats) 3  Time in AT/AF <0.1 hr/day (<0.1%)   Spoke with patient and reports he feels like he has some fluid accumulation.    He has started a new diet and increased fluid intake and mentioned he has been drinking Gatorade.  Advised to check salt amount in Gatorade and explained salt intake and increased fluid intake may be contributing to possible fluid accumulation.  He has an appt on 10/12 with [facility] for [place] problems.  Optivolthoracic impedancesuggesting possible fluid accumulation starting 08/23/2020.  Prescribed:  Furosemide80 mg take1tablet (80 mgtotal)twice a day.(4/22 OV note by Dr Haroldine Laws says he can take 1-2 extra as needed)  Potassium 20 mEq take1tablet daily  Labs: 03/13/2020 Creatinine1.08, BUN 18, Potassium 4.0, Sodium 138, GFR >60 01/15/2020 Creatinine 1.40, BUN 19, Potassium 4.2, Sodium 139, GFR 52-60 12/27/2019 Creatinine 1.21, BUN 16, Potassium 3.8, Sodium 138, GFR >60, BNP 137.8 A complete set of results can be found in Results Review.  Recommendations:He has an appointment with Dr [name] 10/5 and will discuss any fluid symptoms with him.   Follow-up plan: ICM clinic phone appointment on11/06/2020.91 day device clinic remote transmission12/11/2019.  EP/Cardiology Office Visits:08/27/2020 with Dr [name].  09/05/2020 with [place].Recall for 01/14/2021 with Dr [name].  Copy of ICM check sent to Dr.Allred and Dr [name] for [facility].   3 month ICM trend: 08/25/2020    1 Year ICM trend:       Rosalene Billings, RN 08/25/2020 4:01 PM

## 2020-08-26 NOTE — Progress Notes (Signed)
Cardiology Office Note:    Date:  08/27/2020   ID:  Nicholas Foley, DOB 10/07/1951, MRN [id number]  PCP:  [name], MD  Cardiologist:  [name], MD   Referring MD: [name], MD   Chief Complaint  Patient presents with  . Congestive Heart Failure    History of Present Illness:    Nicholas Foley is a 70 y.o. male with a hx of NICM, Chronic CHF (systolic=> EF 40%, 0/8676) ), COPD (prior smoker), OSA (w/CPAP), AFib, HTN, LBBB, CRT-P, morbid obesity, prior amiodarone therapy (DC 2018), and prior smoker.. Suffered COVID-19 infection in December 2020.  He continues to be markedly overweight.  He has limited mobility because of conditioning and weight.  There is also a concern that he has "long-haulers" syndrome post Covid.  He has had x-rays and other evaluations to look for contribution from heart failure.  Recent echo done in March demonstrated EF of 40%, chest x-rays have not revealed volume overload, and exam reveals no clinical evidence of volume overload.  He denies angina, orthopnea, PND, and appears to be compliant with CPAP.  Past Medical History:  Diagnosis Date  . Atrial fibrillation with rapid ventricular response ([place]) 08/02/2013  . CHF (congestive heart failure) ([place])   . Chronic systolic dysfunction of left ventricle 08/02/2013   BNP greater than 2000   . COPD (chronic obstructive pulmonary disease) ([facility])    Moderate by PFTs with response to bronchodilators, May 2014  . Erectile dysfunction   . Hypertension   . Insomnia 08/02/13  . Left bundle branch block 08/02/2013  . Morbid obesity ([place]) 08/02/2013  . OSA (obstructive sleep apnea)    severe OSA with AHI 74/hr now on CPAP at 8cm H2O    Past Surgical History:  Procedure Laterality Date  . BIV PACEMAKER INSERTION CRT-P N/A 12/14/2018   Medtronic model [id number] Percepta Quad CRT-P MRI Rolan Bucco (serial Number [id number]) device implanted by Dr [name] for CHF and LBBB  . CARDIOVERSION N/A 08/06/2013    Procedure: CARDIOVERSION;  Surgeon: Lelon Perla, MD;  Location: [facility] ENDOSCOPY;  Service: Cardiovascular;  Laterality: N/A;  spoke with [name]   . CARDIOVERSION N/A 09/03/2013   Procedure: CARDIOVERSION;  Surgeon: [name], MD;  Location[facility] ENDOSCOPY;  Service: Cardiovascular;  Laterality: N/A;  . chronic systolic dysfu    . NASAL SEPTUM SURGERY    . RIGHT/LEFT HEART CATH AND CORONARY ANGIOGRAPHY N/A 12/04/2018   Procedure: RIGHT/LEFT HEART CATH AND CORONARY ANGIOGRAPHY;  Surgeon: Belva Crome, MD;  Location: [facility] CV LAB;  Service: Cardiovascular;  Laterality: N/A;  . TEE WITHOUT CARDIOVERSION N/A 08/06/2013   Procedure: TRANSESOPHAGEAL ECHOCARDIOGRAM (TEE);  Surgeon: Lelon Perla, MD;  Location: [facility] ENDOSCOPY;  Service: Cardiovascular;  Laterality: N/A;  . VASECTOMY      Current Medications: Current Meds  Medication Sig  . acetaminophen (TYLENOL) 650 MG CR tablet Take 1,300 mg by mouth every 8 (eight) hours as needed for pain.   [name] ADVAIR DISKUS 250-50 MCG/DOSE AEPB Inhale 1 puff into the lungs 2 (two) times daily.  [name] apixaban (ELIQUIS) 5 MG TABS tablet Take 1 tablet (5 mg total) by mouth 2 (two) times daily.  . carvedilol (COREG) 6.25 MG tablet Take 1.5 tablets (9.375 mg total) by mouth 2 (two) times daily with a meal.  . cholecalciferol (VITAMIN D3) 25 MCG (1000 UT) tablet Take 1,000 Units by mouth daily.  [name] CIALIS 20 MG tablet Take 20 mg by mouth  daily as needed for erectile dysfunction.   . cyclobenzaprine (FLEXERIL) 10 MG tablet Take 10 mg by mouth as needed.  [name] ENTRESTO 97-103 MG TAKE 1 TABLET BY MOUTH TWICE DAILY  . fluticasone (FLONASE) 50 MCG/ACT nasal spray Place 1 spray into both nostrils 2 (two) times daily.   . furosemide (LASIX) 40 MG tablet Take 80 mg by mouth 2 (two) times daily.  [name] gatifloxacin (ZYMAXID) 0.5 % SOLN Place 1 drop into the right eye 4 (four) times daily.  [name] omeprazole (PRILOSEC OTC) 20 MG tablet Take 20 mg by mouth daily.  . potassium  chloride SA (KLOR-CON) 20 MEQ tablet Take 1 tablet (20 mEq total) by mouth daily.  [name] spironolactone (ALDACTONE) 25 MG tablet Take 1 tablet (25 mg total) by mouth daily.  . vitamin B-12 (CYANOCOBALAMIN) 1000 MCG tablet Take 1,000 mcg by mouth daily.  [name] zolpidem (AMBIEN CR) 12.5 MG CR tablet Take 1 tablet by mouth at bedtime as needed for sleep.   . [DISCONTINUED] ADVAIR DISKUS 500-50 MCG/DOSE AEPB Inhale 500 each into the lungs 2 (two) times daily.     Allergies:   Patient has no known allergies.   Social History   Socioeconomic History  . Marital status: Married    Spouse name: Not on file  . Number of children: Not on file  . Years of education: Not on file  . Highest education level: Not on file  Occupational History  . Occupation: real estate    Comment: investment  Tobacco Use  . Smoking status: Former Smoker    Packs/day: 1.50    Years: 43.00    Pack years: 64.50    Types: Cigarettes, Cigars    Quit date: 08/02/2012    Years since quitting: 8.0  . Smokeless tobacco: Never Used  Vaping Use  . Vaping Use: Never used  Substance and Sexual Activity  . Alcohol use: Yes    Alcohol/week: 0.0 standard drinks    Comment: once a week; previously 3-4 times per week   . Drug use: No  . Sexual activity: Yes  Other Topics Concern  . Not on file  Social History Narrative   Works as a Engineer, structural for apartments   Lives with wife.  They have one grown daughter   Highest level of education:  college   Social Determinants of Health   Financial Resource Strain:   . Difficulty of Paying Living Expenses: Not on file  Food Insecurity:   . Worried About Charity fundraiser in the Last Year: Not on file  . Ran Out of Food in the Last Year: Not on file  Transportation Needs:   . Lack of Transportation (Medical): Not on file  . Lack of Transportation (Non-Medical): Not on file  Physical Activity:   . Days of Exercise per Week: Not on file  . Minutes of Exercise per Session:  Not on file  Stress:   . Feeling of Stress : Not on file  Social Connections:   . Frequency of Communication with Friends and Family: Not on file  . Frequency of Social Gatherings with Friends and Family: Not on file  . Attends Religious Services: Not on file  . Active Member of Clubs or Organizations: Not on file  . Attends Archivist Meetings: Not on file  . Marital Status: Not on file     Family History: The patient's family history includes Atrial fibrillation in his mother; COPD in his father; Cerebral aneurysm in  his brother; Healthy in his daughter.  ROS:   Please see the history of present illness.    Marked fatigue, dyspnea, fogginess, related to "long haulers".  All other systems reviewed and are negative.  EKGs/Labs/Other Studies Reviewed:    The following studies were reviewed today:  ECHOCARDIOGRAM 01/2020 IMPRESSIONS    1. Left ventricular ejection fraction, by estimation, is 35 to 40%. The  left ventricle has moderately decreased function. The left ventricle  demonstrates global hypokinesis. The left ventricular internal cavity size  was severely dilated. Left  ventricular diastolic function could not be evaluated.  2. Right ventricular systolic function is normal. The right ventricular  size is normal. Tricuspid regurgitation signal is inadequate for assessing  PA pressure.  3. The mitral valve is normal in structure. No evidence of mitral valve  regurgitation. No evidence of mitral stenosis.  4. The aortic valve is normal in structure. Aortic valve regurgitation is  not visualized. No aortic stenosis is present.  5. The inferior vena cava is normal in size with greater than 50%  respiratory variability, suggesting right atrial pressure of 3 mmHg.   EKG:  EKG no new data  Recent Labs: 12/20/2019: ALT 28; Hemoglobin 14.6; NT-Pro BNP 1,939; Platelets 236 01/15/2020: Magnesium 2.1 03/13/2020: B Natriuretic Peptide 78.8; BUN 18; Creatinine, Ser  1.08; Potassium 4.0; Sodium 138  Recent Lipid Panel    Component Value Date/Time   CHOL 184 08/03/2013 0640   TRIG 149 08/03/2013 0640   HDL 50 08/03/2013 0640   CHOLHDL 3.7 08/03/2013 0640   VLDL 30 08/03/2013 0640   LDLCALC 104 (H) 08/03/2013 0640    Physical Exam:    VS:  BP 130/68   Pulse 76   Ht 5\' 11"  (1.803 m)   Wt (!) 337 lb 9.6 oz (153.1 kg)   SpO2 98%   BMI 47.09 kg/m     Wt Readings from Last 3 Encounters:  08/27/20 (!) 337 lb 9.6 oz (153.1 kg)  03/13/20 (!) 333 lb 12.8 oz (151.4 kg)  02/08/20 (!) 330 lb 6.4 oz (149.9 kg)     GEN: Morbidly obese. No acute distress HEENT: Normal NECK: No JVD. LYMPHATICS: No lymphadenopathy CARDIAC:  RRR without murmur, gallop, or edema. VASCULAR:  Normal Pulses. No bruits. RESPIRATORY:  Clear to auscultation without rales, wheezing or rhonchi  ABDOMEN: Soft, non-tender, non-distended, No pulsatile mass, MUSCULOSKELETAL: No deformity  SKIN: Warm and dry NEUROLOGIC:  Alert and oriented x 3 PSYCHIATRIC:  Normal affect   ASSESSMENT:    1. Chronic combined systolic and diastolic heart failure ([place])   2. Biventricular cardiac pacemaker in situ   3. PAF (paroxysmal atrial fibrillation) ([place])   4. Essential hypertension   5. Prediabetes   6. Educated about COVID-19 virus infection   7. Hyperglycemia    PLAN:    In order of problems listed above:  1. Systolic heart failure with use of 3 of the 4 pillars of guideline directed therapy for systolic heart failure.  Could still potentially benefit from dapagliflozin especially since his last hemoglobin A1c in June 2020 was 5.5.  For the time being we will continue Entresto at max dose, spironolactone 25 mg daily, and carvedilol 9.375 mg twice daily.  CBC and basic metabolic panel today. 2. Followed in device clinic 3. On anticoagulation therapy with Eliquis. 4. Heart failure therapy is controlling blood pressure quite nicely.  Continue those medications as listed above as well as  furosemide. 5. Hemoglobin A1c will be obtained today 6.  He is suffering from long-term effects of COVID-19 infection.  He has been vaccinated. 7. A basic metabolic panel, CBC, and G9Q will be obtained today.   Overall education and awareness concerning primary/secondary risk prevention was discussed in detail: LDL less than 70, hemoglobin A1c less than 7, blood pressure target less than 130/80 mmHg, >150 minutes of moderate aerobic activity per week, avoidance of smoking, weight control (via diet and exercise), and continued surveillance/management of/for obstructive sleep apnea.   Medication Adjustments/Labs and Tests Ordered: Current medicines are reviewed at length with the patient today.  Concerns regarding medicines are outlined above.  Orders Placed This Encounter  Procedures  . Basic metabolic panel  . CBC  . HgB A1c   No orders of the defined types were placed in this encounter.   Patient Instructions  Medication Instructions:  Your physician recommends that you continue on your current medications as directed. Please refer to the Current Medication list given to you today.  *If you need a refill on your cardiac medications before your next appointment, please call your pharmacy*   Lab Work: BMET, A1C and CBC today  If you have labs (blood work) drawn today and your tests are completely normal, you will receive your results only by: [name] MyChart Message (if you have MyChart) OR . A paper copy in the mail If you have any lab test that is abnormal or we need to change your treatment, we will call you to review the results.   Testing/Procedures: None   Follow-Up: At [facility], you and your health needs are our priority.  As part of our continuing mission to provide you with exceptional heart care, we have created designated Provider Care Teams.  These Care Teams include your primary Cardiologist (physician) and Advanced Practice Providers (APPs -  Physician Assistants  and Nurse Practitioners) who all work together to provide you with the care you need, when you need it.  We recommend signing up for the patient portal called "MyChart".  Sign up information is provided on this After Visit Summary.  MyChart is used to connect with patients for Virtual Visits (Telemedicine).  Patients are able to view lab/test results, encounter notes, upcoming appointments, etc.  Non-urgent messages can be sent to your provider as well.   To learn more about what you can do with MyChart, go to [email or web address].    Your next appointment:   6 month(s)  The format for your next appointment:   In Person  Provider:   You may see [name], MD or one of the following Advanced Practice Providers on your designated Care Team:    [name], NP  [name], NP  [name], NP    Other Instructions      Signed, [name], MD  08/27/2020 8:23 AM    [place]

## 2020-08-27 ENCOUNTER — Encounter: Payer: Self-pay | Admitting: Interventional Cardiology

## 2020-08-27 ENCOUNTER — Other Ambulatory Visit: Payer: Self-pay

## 2020-08-27 ENCOUNTER — Ambulatory Visit (INDEPENDENT_AMBULATORY_CARE_PROVIDER_SITE_OTHER): Payer: BC Managed Care – PPO | Admitting: Interventional Cardiology

## 2020-08-27 VITALS — BP 130/68 | HR 76 | Ht 71.0 in | Wt 337.6 lb

## 2020-08-27 DIAGNOSIS — R739 Hyperglycemia, unspecified: Secondary | ICD-10-CM

## 2020-08-27 DIAGNOSIS — I10 Essential (primary) hypertension: Secondary | ICD-10-CM | POA: Diagnosis not present

## 2020-08-27 DIAGNOSIS — Z7189 Other specified counseling: Secondary | ICD-10-CM

## 2020-08-27 DIAGNOSIS — Z950 Presence of cardiac pacemaker: Secondary | ICD-10-CM | POA: Diagnosis not present

## 2020-08-27 DIAGNOSIS — I5042 Chronic combined systolic (congestive) and diastolic (congestive) heart failure: Secondary | ICD-10-CM | POA: Diagnosis not present

## 2020-08-27 DIAGNOSIS — I480 Paroxysmal atrial fibrillation: Secondary | ICD-10-CM

## 2020-08-27 DIAGNOSIS — R7303 Prediabetes: Secondary | ICD-10-CM

## 2020-08-27 LAB — CBC
Hematocrit: 41.1 % (ref 37.5–51.0)
Hemoglobin: 14.2 g/dL (ref 13.0–17.7)
MCH: 31.6 pg (ref 26.6–33.0)
MCHC: 34.5 g/dL (ref 31.5–35.7)
MCV: 91 fL (ref 79–97)
Platelets: 293 10*3/uL (ref 150–450)
RBC: 4.5 x10E6/uL (ref 4.14–5.80)
RDW: 12.4 % (ref 11.6–15.4)
WBC: 7.6 10*3/uL (ref 3.4–10.8)

## 2020-08-27 LAB — BASIC METABOLIC PANEL
BUN/Creatinine Ratio: 15 (ref 10–24)
BUN: 22 mg/dL (ref 8–27)
CO2: 21 mmol/L (ref 20–29)
Calcium: 9.5 mg/dL (ref 8.6–10.2)
Chloride: 100 mmol/L (ref 96–106)
Creatinine, Ser: 1.47 mg/dL — ABNORMAL HIGH (ref 0.76–1.27)
GFR calc Af Amer: 56 mL/min/{1.73_m2} — ABNORMAL LOW (ref >59)
GFR calc non Af Amer: 49 mL/min/{1.73_m2} — ABNORMAL LOW (ref >59)
Glucose: 109 mg/dL — ABNORMAL HIGH (ref 65–99)
Potassium: 4.2 mmol/L (ref 3.5–5.2)
Sodium: 138 mmol/L (ref 134–144)

## 2020-08-27 NOTE — Patient Instructions (Addendum)
Medication Instructions:  Your physician recommends that you continue on your current medications as directed. Please refer to the Current Medication list given to you today.  *If you need a refill on your cardiac medications before your next appointment, please call your pharmacy*   Lab Work: BMET, A1C and CBC today  If you have labs (blood work) drawn today and your tests are completely normal, you will receive your results only by: [name] MyChart Message (if you have MyChart) OR . A paper copy in the mail If you have any lab test that is abnormal or we need to change your treatment, we will call you to review the results.   Testing/Procedures: None   Follow-Up: At [facility], you and your health needs are our priority.  As part of our continuing mission to provide you with exceptional heart care, we have created designated Provider Care Teams.  These Care Teams include your primary Cardiologist (physician) and Advanced Practice Providers (APPs -  Physician Assistants and Nurse Practitioners) who all work together to provide you with the care you need, when you need it.  We recommend signing up for the patient portal called "MyChart".  Sign up information is provided on this After Visit Summary.  MyChart is used to connect with patients for Virtual Visits (Telemedicine).  Patients are able to view lab/test results, encounter notes, upcoming appointments, etc.  Non-urgent messages can be sent to your provider as well.   To learn more about what you can do with MyChart, go to [email or web address].    Your next appointment:   6 month(s)  The format for your next appointment:   In Person  Provider:   You may see [name], MD or one of the following Advanced Practice Providers on your designated Care Team:    [name], NP  [name], NP  [name], NP    Other Instructions

## 2020-08-28 DIAGNOSIS — H2511 Age-related nuclear cataract, right eye: Secondary | ICD-10-CM | POA: Diagnosis not present

## 2020-08-28 LAB — HEMOGLOBIN A1C
Est. average glucose Bld gHb Est-mCnc: 108 mg/dL
Hgb A1c MFr Bld: 5.4 % (ref 4.8–5.6)

## 2020-08-29 DIAGNOSIS — H25012 Cortical age-related cataract, left eye: Secondary | ICD-10-CM | POA: Diagnosis not present

## 2020-08-29 DIAGNOSIS — H2512 Age-related nuclear cataract, left eye: Secondary | ICD-10-CM | POA: Diagnosis not present

## 2020-09-04 DIAGNOSIS — G4733 Obstructive sleep apnea (adult) (pediatric): Secondary | ICD-10-CM | POA: Diagnosis not present

## 2020-09-05 ENCOUNTER — Ambulatory Visit (HOSPITAL_COMMUNITY)
Admission: RE | Admit: 2020-09-05 | Discharge: 2020-09-05 | Disposition: A | Discharge status: Home / Self Care | Payer: BC Managed Care – PPO | Source: Ambulatory Visit | Attending: Internal Medicine | Admitting: Internal Medicine

## 2020-09-05 ENCOUNTER — Encounter (HOSPITAL_COMMUNITY): Payer: Self-pay | Admitting: Internal Medicine

## 2020-09-05 ENCOUNTER — Other Ambulatory Visit: Payer: Self-pay

## 2020-09-05 VITALS — BP 130/80 | HR 77 | Ht 71.0 in | Wt 336.2 lb

## 2020-09-05 DIAGNOSIS — Z7951 Long term (current) use of inhaled steroids: Secondary | ICD-10-CM | POA: Insufficient documentation

## 2020-09-05 DIAGNOSIS — I5042 Chronic combined systolic (congestive) and diastolic (congestive) heart failure: Secondary | ICD-10-CM | POA: Insufficient documentation

## 2020-09-05 DIAGNOSIS — Z87891 Personal history of nicotine dependence: Secondary | ICD-10-CM | POA: Insufficient documentation

## 2020-09-05 DIAGNOSIS — Z825 Family history of asthma and other chronic lower respiratory diseases: Secondary | ICD-10-CM | POA: Insufficient documentation

## 2020-09-05 DIAGNOSIS — Z7901 Long term (current) use of anticoagulants: Secondary | ICD-10-CM | POA: Diagnosis not present

## 2020-09-05 DIAGNOSIS — I480 Paroxysmal atrial fibrillation: Secondary | ICD-10-CM | POA: Diagnosis not present

## 2020-09-05 DIAGNOSIS — Z79899 Other long term (current) drug therapy: Secondary | ICD-10-CM | POA: Insufficient documentation

## 2020-09-05 DIAGNOSIS — J449 Chronic obstructive pulmonary disease, unspecified: Secondary | ICD-10-CM | POA: Diagnosis not present

## 2020-09-05 DIAGNOSIS — Z6841 Body Mass Index (BMI) 40.0 and over, adult: Secondary | ICD-10-CM | POA: Diagnosis not present

## 2020-09-05 DIAGNOSIS — I110 Hypertensive heart disease with heart failure: Secondary | ICD-10-CM | POA: Diagnosis not present

## 2020-09-05 DIAGNOSIS — I428 Other cardiomyopathies: Secondary | ICD-10-CM | POA: Diagnosis not present

## 2020-09-05 DIAGNOSIS — Z950 Presence of cardiac pacemaker: Secondary | ICD-10-CM | POA: Insufficient documentation

## 2020-09-05 DIAGNOSIS — G4733 Obstructive sleep apnea (adult) (pediatric): Secondary | ICD-10-CM | POA: Diagnosis not present

## 2020-09-05 MED ORDER — CARVEDILOL 12.5 MG PO TABS: 12.5000 mg | ORAL_TABLET | Freq: Two times a day (BID) | ORAL | 6 refills | Status: DC

## 2020-09-05 NOTE — Patient Instructions (Signed)
Increase Coreg to 12.5 mg, one tab twice a day  Your physician has requested that you have an echocardiogram. Echocardiography is a painless test that uses sound waves to create images of your heart. It provides your doctor with information about the size and shape of your heart and how well your heart's chambers and valves are working. This procedure takes approximately one hour. There are no restrictions for this procedure.  Your physician recommends that you schedule a follow-up appointment in: 4 months with Dr Haroldine Laws and echo  If you have any questions or concerns before your next appointment please send [place] a message through [place] or call our office at [phone].    TO LEAVE A MESSAGE FOR THE NURSE SELECT OPTION 2, PLEASE LEAVE A MESSAGE INCLUDING: . YOUR NAME . DATE OF BIRTH . CALL BACK NUMBER . REASON FOR CALL**this is important as we prioritize the call backs  YOU WILL RECEIVE A CALL BACK THE SAME DAY AS LONG AS YOU CALL BEFORE 4:00 PM .

## 2020-09-05 NOTE — Progress Notes (Signed)
Advanced Heart Failure Clinic Note   Date:  09/05/2020   ID:  Nicholas Foley, DOB [birth year], MRN [id number]  Location: Home  Provider location: [facility] Type of Visit: Established patient  PCP:  [name], MD  Cardiologist:  [name], MD Primary HF: Jodi Criscuolo  Chief Complaint: Heart Failure follow-up    History of Present Illness:  Nicholas Foley is a 69 year old with a history of morbid obesity, chronic systolic heart failure, COPD, OSA, PAF, previously on amio-> stopped 2018,  HTN, LBBB, Medtronic CRT-P, and former smoker (quit 7-8 years ago).   He has h/o NICM which seems to date back to 2014. Had cardiac cath in 1/20 with no CAD and well compensated filling pressures.   Admitted in January 2020 for CRT-P device in setting of LBBB. Intially he felt a lot better but has gradually declined. Over the last month he has been having ongoing episodes with dyspnea and fatigue. Saw Dr. [name] who referred him here for further evaluation.   He got COVID in December 2020. Was sick but didn't get admitted. Did not need O2. Went back to work for a week. Then got worse. Saw [name] NP. Lasix increased from 80 bid to 160/80 for 3 days. Had better urine output and breathing some better. Had CXR which showed PNA and was placed on abx. However still SOB and fatigued. No orthopnea or PND. No fevers or chills. Had AHF f/u 2/4 and was volume overloaded on exam any by Optivol. His lasix was increased to 120 mg bid for several days. He had return f/u with EP on 2/23 and volume improved. Optivol improved. Lasix was reduced back down to original dose of 80 mg daily. Labs were repeated and SCr and K stable.   He returns for f/u. Has been struggling to get better from [place] and has seen post-COVID Clinic at [facility]. That said, over the past 2 weeks starting to feel better. CT 5/21 no evidence of post COVID fibrosis/inflammation. No PE. Now back to work and does well as  long as he takes his time. No edema, orthopnea or PND.   Echo 01/30/20 EF 40-45% (read as 35-40%)  ICD: No VT/AF. Volume looks good. 100% bivpacing Personally reviewed   Cardiac Studies CPX 09/18/19  Limited by obesity and mild heart failure Peak VO2 15.1  (80% predicted peak VO2) - when corrected to ibw pVO2 27.5 [place] 33 RER 1.02   LHC/RHC 11/2018 No coronary disease.  RA 13 PA 37/20 (27)  PCWP 14 CO 8.5 CI 3.3   ECHO 09/27/2019 -RV normal EF 45-50%  ECHO 09/2018 -EF 35-40% Grade II DD      Past Medical History:  Diagnosis Date  . Atrial fibrillation with rapid ventricular response ([place]) 08/02/2013  . CHF (congestive heart failure) ([place])   . Chronic systolic dysfunction of left ventricle 08/02/2013   BNP greater than 2000   . COPD (chronic obstructive pulmonary disease) ([facility])    Moderate by PFTs with response to bronchodilators, May 2014  . Erectile dysfunction   . Hypertension   . Insomnia 08/02/13  . Left bundle branch block 08/02/2013  . Morbid obesity ([place]) 08/02/2013  . OSA (obstructive sleep apnea)    severe OSA with AHI 74/hr now on CPAP at 8cm H2O   Past Surgical History:  Procedure Laterality Date  . BIV PACEMAKER INSERTION CRT-P N/A 12/14/2018   Medtronic model [id number] Percepta Quad CRT-P MRI  Surescan (serial Number [id number]) device implanted by Dr [name] for CHF and LBBB  . CARDIOVERSION N/A 08/06/2013   Procedure: CARDIOVERSION;  Surgeon: Lelon Perla, MD;  Location: [facility] ENDOSCOPY;  Service: Cardiovascular;  Laterality: N/A;  spoke with [name]   . CARDIOVERSION N/A 09/03/2013   Procedure: CARDIOVERSION;  Surgeon: [name], MD;  Location: [facility] ENDOSCOPY;  Service: Cardiovascular;  Laterality: N/A;  . chronic systolic dysfu    . NASAL SEPTUM SURGERY    . RIGHT/LEFT HEART CATH AND CORONARY ANGIOGRAPHY N/A 12/04/2018   Procedure: RIGHT/LEFT HEART CATH AND CORONARY ANGIOGRAPHY;  Surgeon: Belva Crome, MD;  Location: [place] CV LAB;  Service:  Cardiovascular;  Laterality: N/A;  . TEE WITHOUT CARDIOVERSION N/A 08/06/2013   Procedure: TRANSESOPHAGEAL ECHOCARDIOGRAM (TEE);  Surgeon: Lelon Perla, MD;  Location: [facility] ENDOSCOPY;  Service: Cardiovascular;  Laterality: N/A;  . VASECTOMY       Current Outpatient Medications  Medication Sig Dispense Refill  . acetaminophen (TYLENOL) 650 MG CR tablet Take 1,300 mg by mouth every 8 (eight) hours as needed for pain.     [name] ADVAIR DISKUS 250-50 MCG/DOSE AEPB Inhale 1 puff into the lungs 2 (two) times daily.    [name] apixaban (ELIQUIS) 5 MG TABS tablet Take 1 tablet (5 mg total) by mouth 2 (two) times daily. 180 tablet 1  . carvedilol (COREG) 6.25 MG tablet Take 1.5 tablets (9.375 mg total) by mouth 2 (two) times daily with a meal. 90 tablet 11  . cholecalciferol (VITAMIN D3) 25 MCG (1000 UT) tablet Take 1,000 Units by mouth daily.    [name] CIALIS 20 MG tablet Take 20 mg by mouth daily as needed for erectile dysfunction.   0  . cyclobenzaprine (FLEXERIL) 10 MG tablet Take 10 mg by mouth as needed.    [name] ENTRESTO 97-103 MG TAKE 1 TABLET BY MOUTH TWICE DAILY 180 tablet 3  . fluticasone (FLONASE) 50 MCG/ACT nasal spray Place 1 spray into both nostrils 2 (two) times daily.   0  . furosemide (LASIX) 40 MG tablet Take 80 mg by mouth 2 (two) times daily.    [name] gatifloxacin (ZYMAXID) 0.5 % SOLN Place 1 drop into the right eye 4 (four) times daily.    [name] omeprazole (PRILOSEC OTC) 20 MG tablet Take 20 mg by mouth daily.    . potassium chloride SA (KLOR-CON) 20 MEQ tablet Take 1 tablet (20 mEq total) by mouth daily. 90 tablet 3  . spironolactone (ALDACTONE) 25 MG tablet Take 1 tablet (25 mg total) by mouth daily. 90 tablet 3  . vitamin B-12 (CYANOCOBALAMIN) 1000 MCG tablet Take 1,000 mcg by mouth daily.    [name] zolpidem (AMBIEN CR) 12.5 MG CR tablet Take 1 tablet by mouth at bedtime as needed for sleep.      No current facility-administered medications for this encounter.    Allergies:   Patient has no known  allergies.   Social History:  The patient  reports that he quit smoking about 8 years ago. His smoking use included cigarettes and cigars. He has a 64.50 pack-year smoking history. He has never used smokeless tobacco. He reports current alcohol use. He reports that he does not use drugs.   Family History:  The patient's family history includes Atrial fibrillation in his mother; COPD in his father; Cerebral aneurysm in his brother; Healthy in his daughter.   ROS:  Please see the history of present illness.   All other systems are personally reviewed and  negative.   Vitals:   09/05/20 1151  BP: 130/80  Pulse: 77  SpO2: 96%    PHYSICAL EXAM: General:  Obese Well appearing. No resp difficulty HEENT: normal Neck: supple. no JVD. Carotids 2+ bilat; no bruits. No lymphadenopathy or thryomegaly appreciated. Cor: PMI nondisplaced. Regular rate & rhythm. No rubs, gallops or murmurs. Lungs: clear Abdomen: obese soft, nontender, nondistended. No hepatosplenomegaly. No bruits or masses. Good bowel sounds. Extremities: no cyanosis, clubbing, rash, edema Neuro: alert & orientedx3, cranial nerves grossly intact. moves all 4 extremities w/o difficulty. Affect pleasant   Recent Labs: 12/20/2019: ALT 28; NT-Pro BNP 1,939 01/15/2020: Magnesium 2.1 03/13/2020: B Natriuretic Peptide 78.8 08/27/2020: BUN 22; Creatinine, Ser 1.47; Hemoglobin 14.2; Platelets 293; Potassium 4.2; Sodium 138  Personally reviewed   Wt Readings from Last 3 Encounters:  09/05/20 (!) 152.5 kg (336 lb 3.2 oz)  08/27/20 (!) 153.1 kg (337 lb 9.6 oz)  03/13/20 (!) 151.4 kg (333 lb 12.8 oz)    NSR 79 with bivpacing Personally reviewed  ASSESSMENT AND PLAN:  1. Chronic Combined Systolic/Diastolic HF - NICM (thought due to LBBB). 12/2018 LHC no coronary disease.  - s/p Medtronic CRT-P - ECHO 11/2017 EF 35-40%. ECHO 11/19 EF 45-50%.  - Echo 11/20 read as 35-40% but with Definity I felt closed to 45% - Echo 3/21 EF read as 35-40% I  think 40-45% - CPX test suboptimal effort mostly limited to obesity  - ICD interrogation done personally - results as above  - Improved NYHA II. Volume status mildly up on device - Continue Lasix 80 bid - Increase carvedilol to 12.5 mg twice a day - Continue Entresto 97-103 mg twice a day.  - Continue spiro 25 mg daily. - See back in 4 months with echo. If EF still down start [place] labs  2. PAF  - In the past he was on amiodarone. Stopped ~2017 - No AF on device today - Continue Eliquis 5 bid. No bleeding. Check CBC  3. OSA - Continue CPAP  4. Obesity -  Body mass index is 46.89 kg/m.  - suspect this is a major factor in his exercise intoelrance - He is trying to lose 80 pounds. discussed low carb diet.   5. COPD  - no longer smoking    Signed, Glori Bickers, MD  09/05/2020 12:30 PM  Advanced Heart Failure [place] [address] [phone] (office) [phone] (fax)

## 2020-09-09 ENCOUNTER — Other Ambulatory Visit: Payer: Self-pay

## 2020-09-09 MED ORDER — POTASSIUM CHLORIDE CRYS ER 20 MEQ PO TBCR: 20.0000 meq | EXTENDED_RELEASE_TABLET | Freq: Every day | ORAL | 3 refills | Status: DC

## 2020-09-10 ENCOUNTER — Encounter (INDEPENDENT_AMBULATORY_CARE_PROVIDER_SITE_OTHER): Payer: Self-pay | Admitting: Ophthalmology

## 2020-09-11 DIAGNOSIS — H2512 Age-related nuclear cataract, left eye: Secondary | ICD-10-CM | POA: Diagnosis not present

## 2020-09-19 ENCOUNTER — Other Ambulatory Visit: Payer: Self-pay | Admitting: Interventional Cardiology

## 2020-09-22 ENCOUNTER — Encounter (INDEPENDENT_AMBULATORY_CARE_PROVIDER_SITE_OTHER): Payer: Medicare Other | Admitting: Ophthalmology

## 2020-09-26 ENCOUNTER — Telehealth: Payer: Self-pay

## 2020-09-26 NOTE — Telephone Encounter (Signed)
**Note De-Identified Nicholas Foley Obfuscation** I started a Entresto PA through covermymeds: Key: [id number]

## 2020-09-29 ENCOUNTER — Ambulatory Visit (INDEPENDENT_AMBULATORY_CARE_PROVIDER_SITE_OTHER): Payer: BC Managed Care – PPO

## 2020-09-29 DIAGNOSIS — I5042 Chronic combined systolic (congestive) and diastolic (congestive) heart failure: Secondary | ICD-10-CM

## 2020-09-29 DIAGNOSIS — Z950 Presence of cardiac pacemaker: Secondary | ICD-10-CM | POA: Diagnosis not present

## 2020-09-30 NOTE — Progress Notes (Signed)
EPIC Encounter for ICM Monitoring  Patient Name: Nicholas Foley is a 69 y.o. male Date: 09/30/2020 Primary Care Physican: [name], MD Primary Cardiologist:Smith/Bensimhon Electrophysiologist:Allred Bi-V Pacing:93.7% 10/15/2021Office Weight: 336lbs (does not weigh at home)  Time in AT/AF <0.1 hr/day (<0.1%)   Spoke with patient and reports he feels like he has some fluid accumulation.  He had COVID booster last weekend drank extra fluids over the weekend which may have contributed to fluid accumulation.  Patient continues to have long haul COVID symptoms but improving.      Optivolthoracic impedancesuggesting possible fluid accumulation starting 09/24/2020.  Prescribed:  Furosemide80 mg take1tablet (80 mgtotal)twice a day.(11/921 pt reports Dr [name] & Dr Haroldine Laws have both approved he may take extra Furosemide and he only takes when absolutely necessary)  Potassium 20 mEq take1tablet daily  Labs: 08/26/2020 Creatinine 1.47, BUN 22, Potassium 4.2, Sodium 138, GFR 49-56 03/13/2020 Creatinine1.08, BUN 18, Potassium 4.0, Sodium 138, GFR >60 01/15/2020 Creatinine 1.40, BUN 19, Potassium 4.2, Sodium 139, GFR 52-60 12/27/2019 Creatinine 1.21, BUN 16, Potassium 3.8, Sodium 138, GFR >60, BNP 137.8 A complete set of results can be found in Results Review.  Recommendations: He is limiting fluid intake.  He will take extra 1/2 - 1 tablet of Furosemide tomorrow if he feels like he still has fluid.  He does feel fluid levels are improving.   Follow-up plan: ICM clinic phone appointment on11/16/2021 to recheck fluid levels and will only call if it abnormal.91 day device clinic remote transmission12/11/2019.  EP/Cardiology Office Visits:01/07/2021 with [place].Recall for2/23/2022 with Dr [name].  Recall for 02/23/2021 with Dr [name]  Copy of ICM check sent to [facility] and Dr Haroldine Laws.     3 month ICM trend: 09/29/2020    1 Year ICM trend:       Rosalene Billings, RN 09/30/2020 4:31 PM

## 2020-10-01 NOTE — Telephone Encounter (Addendum)
**Note De-Identified Tekoa Amon Obfuscation** Following message received through covermymeds:  [facility] Key: [id number] - PA Case ID: [id number]  Outcome: Approved on November 5 Request Reference Number: [id number].  ENTRESTO TAB 97-103MG  is approved through 09/26/2021.  Your patient may now fill this prescription and it will be covered. Drug: [name] 97-103MG  tablets Form: [employer] Electronic Prior Authorization Form (2017 NCPDP)  I have notified [place] of this approval.

## 2020-10-07 ENCOUNTER — Ambulatory Visit (INDEPENDENT_AMBULATORY_CARE_PROVIDER_SITE_OTHER): Payer: BC Managed Care – PPO

## 2020-10-07 DIAGNOSIS — I5042 Chronic combined systolic (congestive) and diastolic (congestive) heart failure: Secondary | ICD-10-CM

## 2020-10-07 DIAGNOSIS — Z950 Presence of cardiac pacemaker: Secondary | ICD-10-CM

## 2020-10-08 ENCOUNTER — Encounter (INDEPENDENT_AMBULATORY_CARE_PROVIDER_SITE_OTHER): Payer: 59 | Admitting: Ophthalmology

## 2020-10-08 ENCOUNTER — Telehealth: Payer: Self-pay

## 2020-10-08 ENCOUNTER — Other Ambulatory Visit: Payer: Self-pay

## 2020-10-08 DIAGNOSIS — H35033 Hypertensive retinopathy, bilateral: Secondary | ICD-10-CM | POA: Diagnosis not present

## 2020-10-08 DIAGNOSIS — H353122 Nonexudative age-related macular degeneration, left eye, intermediate dry stage: Secondary | ICD-10-CM

## 2020-10-08 DIAGNOSIS — H43813 Vitreous degeneration, bilateral: Secondary | ICD-10-CM | POA: Diagnosis not present

## 2020-10-08 DIAGNOSIS — I10 Essential (primary) hypertension: Secondary | ICD-10-CM

## 2020-10-08 DIAGNOSIS — D1809 Hemangioma of other sites: Secondary | ICD-10-CM

## 2020-10-08 NOTE — Progress Notes (Signed)
EPIC Encounter for ICM Monitoring  Patient Name: Nicholas Foley is a 69 y.o. male Date: 10/08/2020 Primary Care Physican: [name], MD Primary Cardiologist:Smith/Bensimhon Electrophysiologist:Allred Bi-V Pacing:94.8% 10/15/2021Office Weight: 336lbs (does not weigh at home)  Time in AT/AF <0.1 hr/day (<0.1%)   Attempted call to patient and unable to reach.  Left detailed message per DPR regarding transmission. Transmission reviewed.     Optivolthoracic impedancesuggesting possible fluid accumulation starting 09/24/2020.  Prescribed:  Furosemide80 mg take1tablet (80 mgtotal)twice a day.(11/921 pt reports Dr [name] & Dr Haroldine Laws have both approved he may take extra Furosemide and he only takes when absolutely necessary)  Potassium 20 mEq take1tablet daily  Labs: 08/26/2020 Creatinine 1.47, BUN 22, Potassium 4.2, Sodium 138, GFR 49-56 03/13/2020 Creatinine1.08, BUN 18, Potassium 4.0, Sodium 138, GFR >60 01/15/2020 Creatinine 1.40, BUN 19, Potassium 4.2, Sodium 139, GFR 52-60 12/27/2019 Creatinine 1.21, BUN 16, Potassium 3.8, Sodium 138, GFR >60, BNP 137.8 A complete set of results can be found in Results Review.  Recommendations: Left voice mail with ICM number and encouraged to call if experiencing any fluid symptoms.  Follow-up plan: ICM clinic phone appointment on12/13/2021.91 day device clinic remote transmission12/11/2019.  EP/Cardiology Office Visits:01/07/2021 with [place].Recall for2/23/2022 with Dr [name].  Recall for 02/23/2021 with Dr [name]  Copy of ICM check sent to Dr.Allred  3 month ICM trend: 10/07/2020    1 Year ICM trend:       Rosalene Billings, RN 10/08/2020 1:33 PM

## 2020-10-08 NOTE — Telephone Encounter (Signed)
Remote ICM transmission received.  Attempted call to patient regarding ICM remote transmission and left detailed message per DPR.  Advised to return call for any fluid symptoms or questions. Next ICM remote transmission scheduled 11/03/2020.

## 2020-10-22 ENCOUNTER — Ambulatory Visit (INDEPENDENT_AMBULATORY_CARE_PROVIDER_SITE_OTHER): Payer: 59

## 2020-10-22 DIAGNOSIS — I428 Other cardiomyopathies: Secondary | ICD-10-CM

## 2020-10-22 LAB — CUP PACEART REMOTE DEVICE CHECK
Battery Remaining Longevity: 122 mo
Battery Voltage: 3.01 V
Brady Statistic AP VP Percent: 0.21 %
Brady Statistic AP VS Percent: 0.02 %
Brady Statistic AS VP Percent: 95.49 %
Brady Statistic AS VS Percent: 4.28 %
Brady Statistic RA Percent Paced: 0.23 %
Brady Statistic RV Percent Paced: 16.39 %
Date Time Interrogation Session: 20211130194731
Implantable Lead Implant Date: 20200123
Implantable Lead Implant Date: 20200123
Implantable Lead Implant Date: 20200123
Implantable Lead Location: 753858
Implantable Lead Location: 753859
Implantable Lead Location: 753860
Implantable Lead Model: 4598
Implantable Lead Model: 5076
Implantable Lead Model: 5076
Implantable Pulse Generator Implant Date: 20200123
Lead Channel Impedance Value: 1045 Ohm
Lead Channel Impedance Value: 1064 Ohm
Lead Channel Impedance Value: 1121 Ohm
Lead Channel Impedance Value: 1254 Ohm
Lead Channel Impedance Value: 1482 Ohm
Lead Channel Impedance Value: 1539 Ohm
Lead Channel Impedance Value: 361 Ohm
Lead Channel Impedance Value: 437 Ohm
Lead Channel Impedance Value: 456 Ohm
Lead Channel Impedance Value: 494 Ohm
Lead Channel Impedance Value: 589 Ohm
Lead Channel Impedance Value: 722 Ohm
Lead Channel Impedance Value: 817 Ohm
Lead Channel Impedance Value: 931 Ohm
Lead Channel Pacing Threshold Amplitude: 0.375 V
Lead Channel Pacing Threshold Amplitude: 0.875 V
Lead Channel Pacing Threshold Amplitude: 2 V
Lead Channel Pacing Threshold Pulse Width: 0.4 ms
Lead Channel Pacing Threshold Pulse Width: 0.4 ms
Lead Channel Pacing Threshold Pulse Width: 0.5 ms
Lead Channel Sensing Intrinsic Amplitude: 1.25 mV
Lead Channel Sensing Intrinsic Amplitude: 1.25 mV
Lead Channel Sensing Intrinsic Amplitude: 8.5 mV
Lead Channel Sensing Intrinsic Amplitude: 8.5 mV
Lead Channel Setting Pacing Amplitude: 2 V
Lead Channel Setting Pacing Amplitude: 2.5 V
Lead Channel Setting Pacing Amplitude: 2.5 V
Lead Channel Setting Pacing Pulse Width: 0.4 ms
Lead Channel Setting Pacing Pulse Width: 0.5 ms
Lead Channel Setting Sensing Sensitivity: 2 mV

## 2020-10-23 ENCOUNTER — Ambulatory Visit: Payer: Self-pay

## 2020-10-23 ENCOUNTER — Other Ambulatory Visit: Payer: Self-pay | Admitting: Pulmonary Disease

## 2020-10-23 ENCOUNTER — Other Ambulatory Visit: Payer: Self-pay

## 2020-10-23 DIAGNOSIS — J449 Chronic obstructive pulmonary disease, unspecified: Secondary | ICD-10-CM

## 2020-10-23 LAB — PULMONARY FUNCTION TEST
DL/VA % pred: 99 %
DL/VA: 4.08 ml/min/mmHg/L
DLCO cor % pred: 89 %
DLCO cor: 24.34 ml/min/mmHg
DLCO unc % pred: 88 %
DLCO unc: 24.06 ml/min/mmHg
FEF 25-75 Post: 1.92 L/sec
FEF 25-75 Pre: 1.22 L/sec
FEF2575-%Change-Post: 57 %
FEF2575-%Pred-Post: 70 %
FEF2575-%Pred-Pre: 44 %
FEV1-%Change-Post: 7 %
FEV1-%Pred-Post: 71 %
FEV1-%Pred-Pre: 66 %
FEV1-Post: 2.5 L
FEV1-Pre: 2.33 L
FEV1FVC-%Change-Post: 3 %
FEV1FVC-%Pred-Pre: 93 %
FEV6-%Change-Post: 4 %
FEV6-%Pred-Post: 76 %
FEV6-%Pred-Pre: 73 %
FEV6-Post: 3.43 L
FEV6-Pre: 3.29 L
FEV6FVC-%Change-Post: 0 %
FEV6FVC-%Pred-Post: 104 %
FEV6FVC-%Pred-Pre: 103 %
FVC-%Change-Post: 3 %
FVC-%Pred-Post: 73 %
FVC-%Pred-Pre: 71 %
FVC-Post: 3.46 L
FVC-Pre: 3.35 L
Post FEV1/FVC ratio: 72 %
Post FEV6/FVC ratio: 99 %
Pre FEV1/FVC ratio: 69 %
Pre FEV6/FVC Ratio: 98 %
RV % pred: 139 %
RV: 3.41 L
TLC % pred: 95 %
TLC: 6.9 L

## 2020-10-29 NOTE — Progress Notes (Signed)
Remote pacemaker transmission.   

## 2020-11-03 ENCOUNTER — Ambulatory Visit (INDEPENDENT_AMBULATORY_CARE_PROVIDER_SITE_OTHER): Payer: 59

## 2020-11-03 DIAGNOSIS — I5042 Chronic combined systolic (congestive) and diastolic (congestive) heart failure: Secondary | ICD-10-CM

## 2020-11-03 DIAGNOSIS — Z950 Presence of cardiac pacemaker: Secondary | ICD-10-CM | POA: Diagnosis not present

## 2020-11-05 ENCOUNTER — Telehealth: Payer: Self-pay

## 2020-11-05 NOTE — Telephone Encounter (Signed)
Remote ICM transmission received.  Attempted call to patient regarding ICM remote transmission and left detailed message per DPR.  Advised to return call for any fluid symptoms or questions.  

## 2020-11-05 NOTE — Progress Notes (Signed)
EPIC Encounter for ICM Monitoring  Patient Name: Nicholas Foley is a 69 y.o. male Date: 11/05/2020 Primary Care Physican: [name], MD Primary Cardiologist:Smith/Bensimhon Electrophysiologist:Allred Bi-V Pacing:96.4% 10/15/2021OfficeWeight: 336lbs (does not weigh at home)  Time in AT/AF <0.1 hr/day (<0.1%)   Attempted call to patient and unable to reach.  Left detailed message per DPR regarding transmission. Transmission reviewed.   Optivolthoracic impedancenormal.  Prescribed:  Furosemide40 mg take2tablets (80 mgtotal)twice a day.(09/30/20 pt reports Dr [name] & Dr Haroldine Laws have both approved he may take extra Furosemide and he only takes when absolutely necessary)  Potassium 20 mEq take1tablet daily  Labs: 08/26/2020 Creatinine 1.47, BUN 22, Potassium 4.2, Sodium 138, GFR 49-56 03/13/2020 Creatinine1.08, BUN 18, Potassium 4.0, Sodium 138, GFR >60 01/15/2020 Creatinine 1.40, BUN 19, Potassium 4.2, Sodium 139, GFR 52-60 12/27/2019 Creatinine 1.21, BUN 16, Potassium 3.8, Sodium 138, GFR >60, BNP 137.8 A complete set of results can be found in Results Review.  Recommendations: Left voice mail with ICM number and encouraged to call if experiencing any fluid symptoms.  Follow-up plan: ICM clinic phone appointment on2/11/2020/2021.91 day device clinic remote transmission3/12/2020.  EP/Cardiology Office Visits:2/16/2022with [place].Recall for2/23/2022 with Dr [name].Recall for 02/23/2021 with Dr [name]  Copy of ICM check sent to Dr.Allred   3 month ICM trend: 11/02/2020    1 Year ICM trend:       Rosalene Billings, RN 11/05/2020 9:16 AM

## 2020-12-07 IMAGING — CT CT CHEST LUNG CANCER SCREENING LOW DOSE
2 of 3 series · 15 of 36 positions shown, 18 images · non-contrast
Comparison: 12/15/2018 chest radiograph. No prior CT.

CLINICAL DATA: Sixty-four pack-year smoking history; quit 6 years
ago.

EXAM:
CT CHEST WITHOUT CONTRAST LOW-DOSE FOR LUNG CANCER SCREENING
TECHNIQUE: Multidetector CT imaging of the chest was performed following the
standard protocol without IV contrast.

[Series 2: thorax 5.0 i31f 3 · axial · 0.86mm/px · z∈[-354,-69]mm · 12 of 69 slices shown, 15 images]
[im 6/69  mediastinal]
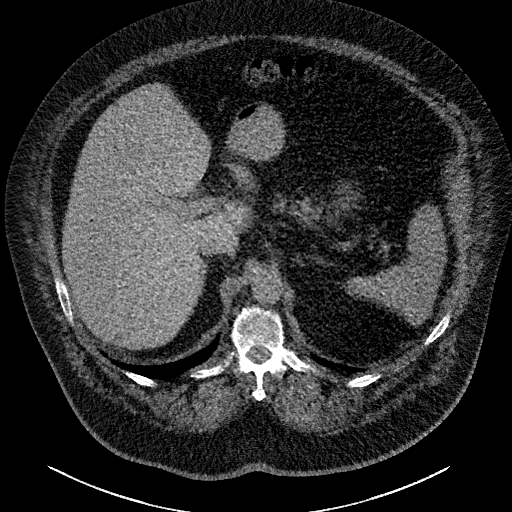
[im 6/69  lung]
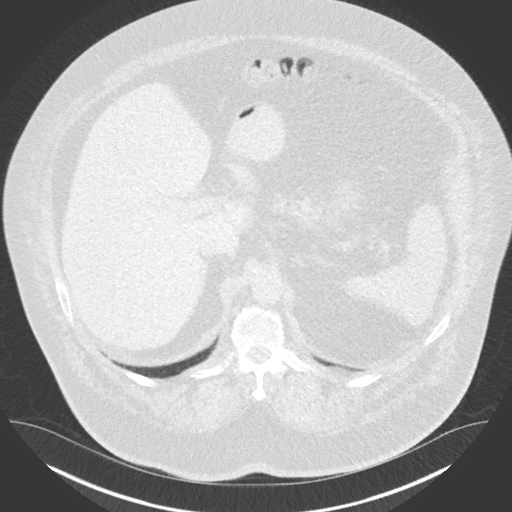
[im 11/69  lung]
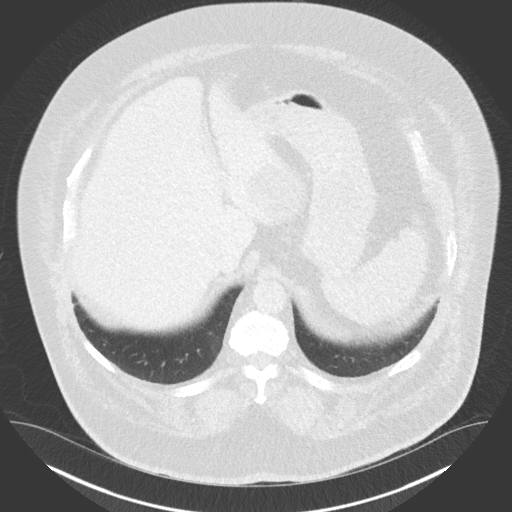
[im 16/69  lung]
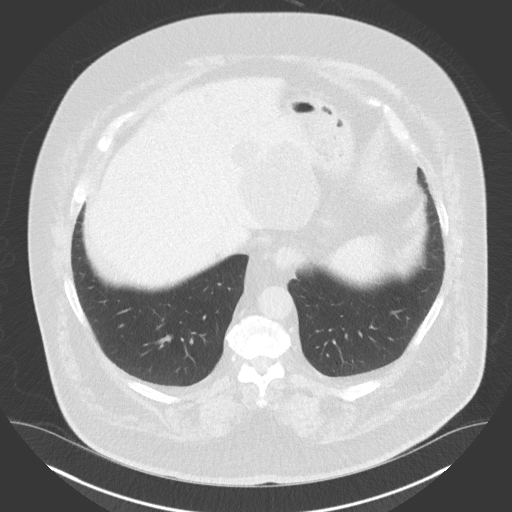
[im 21/69  lung]
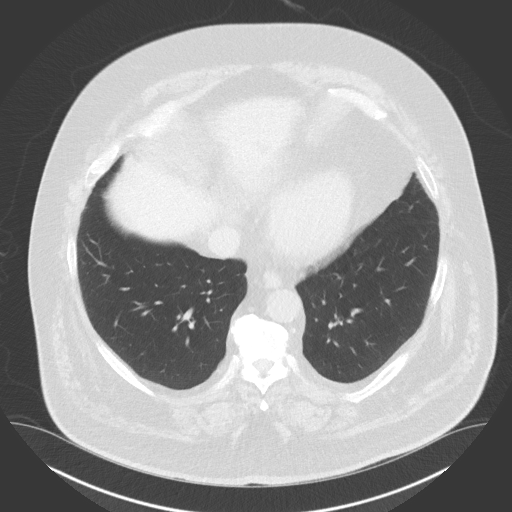
[im 26/69  mediastinal]
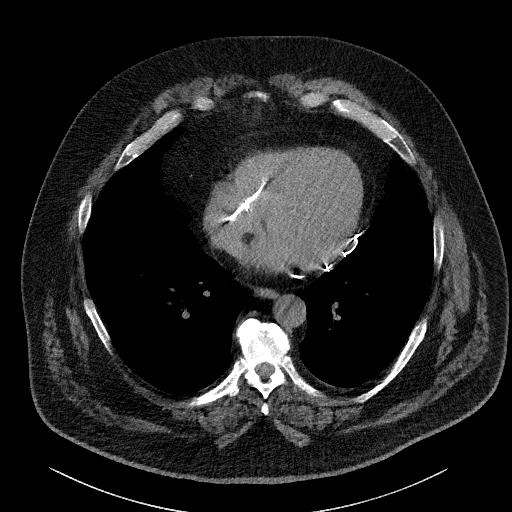
[im 26/69  lung]
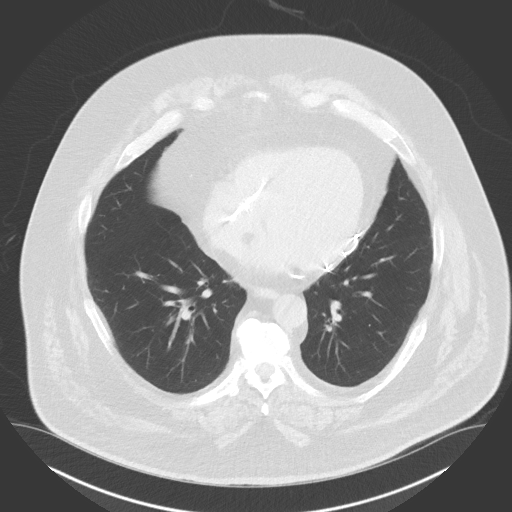
[im 31/69  lung]
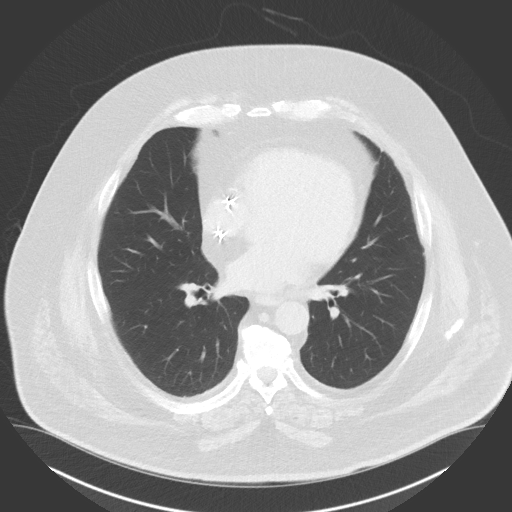
[im 38/69  lung]
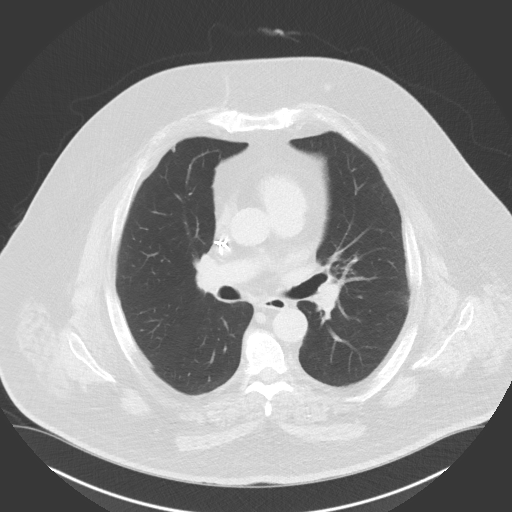
[im 43/69  lung]
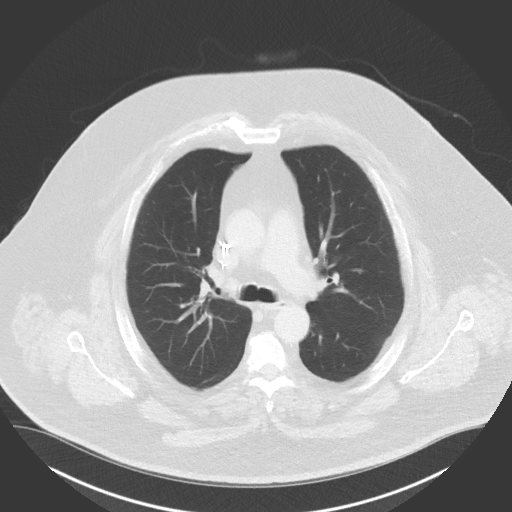
[im 48/69  mediastinal]
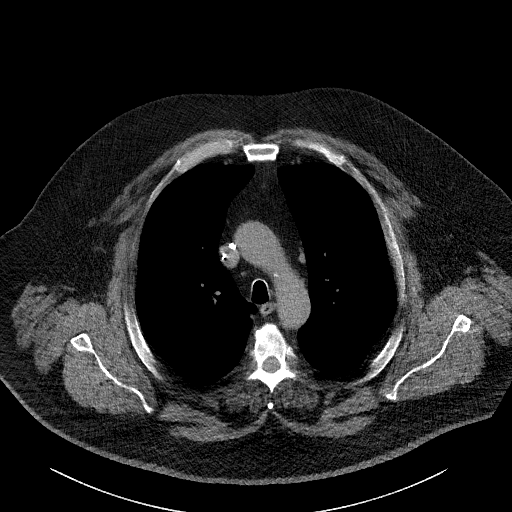
[im 48/69  lung]
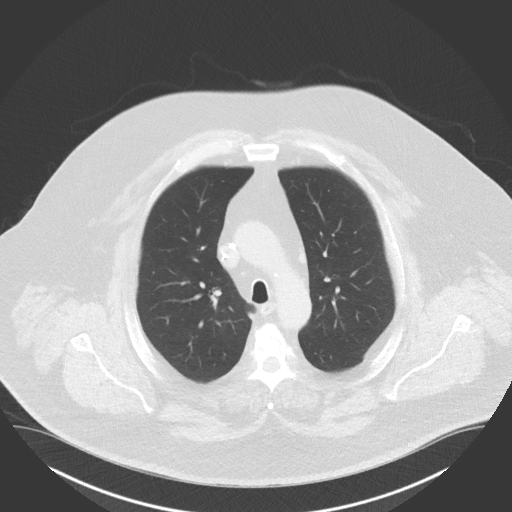
[im 53/69  lung]
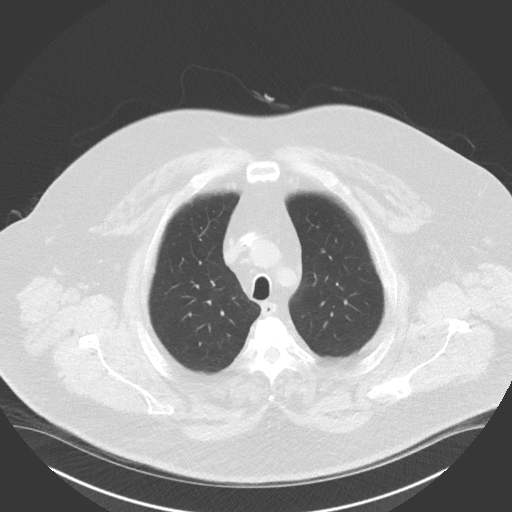
[im 58/69  lung]
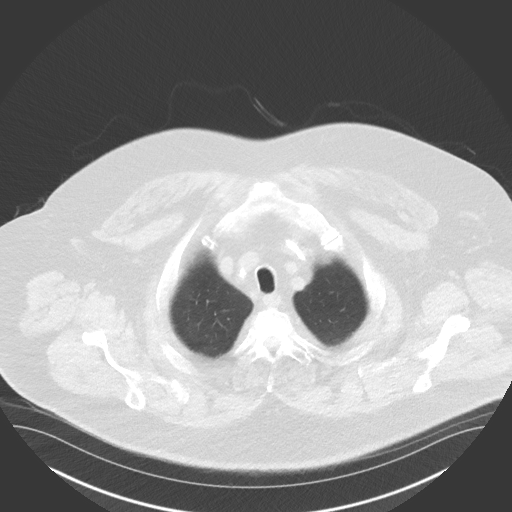
[im 63/69  lung]
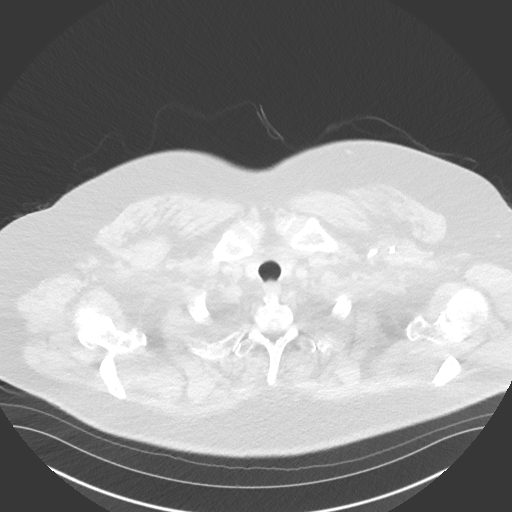

[Series 5: coronal · coronal · 0.67mm/px · 3 of 160 slices shown]
[im 32/160  lung]
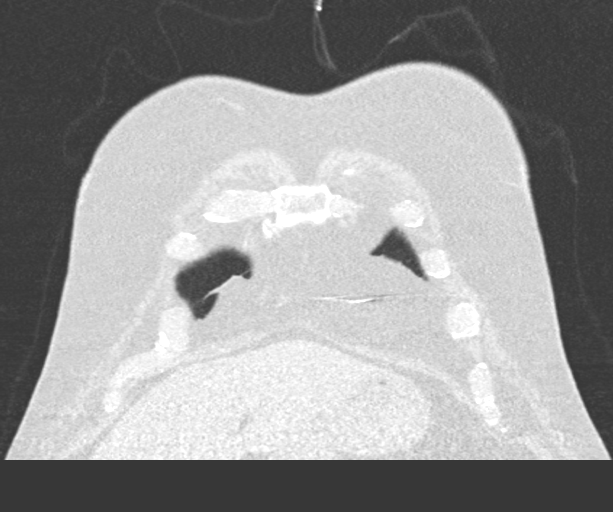
[im 64/160  lung]
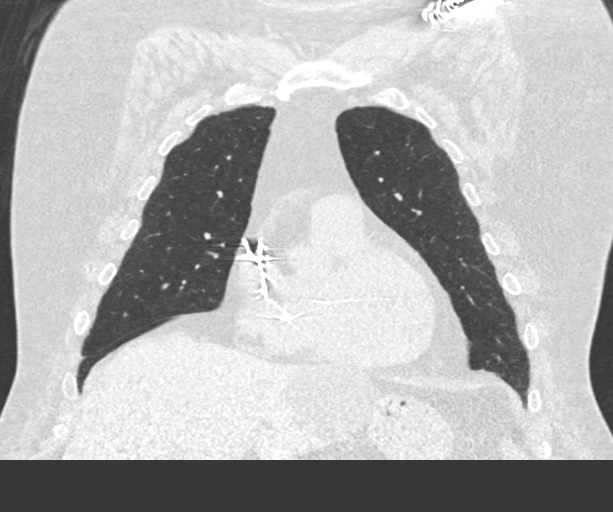
[im 96/160  lung]
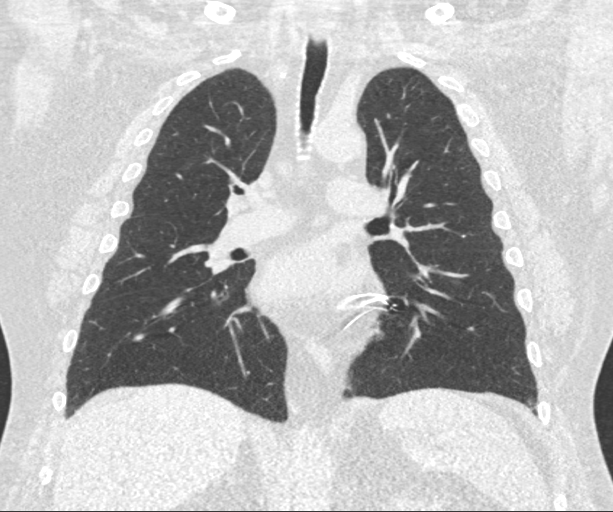

[15 of 36 positions shown; findings below may reference images not displayed]

FINDINGS: Cardiovascular: Pacer/AICD device.

Bovine arch. Aortic and branch vessel atherosclerosis. Tortuous
thoracic aorta. Normal heart size with lipomatous hypertrophy of the
interatrial septum. Right proximal coronary artery atherosclerosis.

Mediastinum/Nodes: No mediastinal or definite hilar adenopathy,
given limitations of unenhanced CT.

Lungs/Pleura: Mild bilateral pleural thickening. Mild centrilobular
emphysema. Bilateral pulmonary nodules of maximally volume derived
equivalent diameter 5.2 mm in the right upper lobe.

Upper Abdomen: Dominant left hepatic lobe cyst of 6.4 cm. Smaller
hepatic cysts. Normal imaged portions of the spleen, stomach,
pancreas, adrenal glands. The upper pole left kidney the is only
minimally imaged. Somewhat atypical in appearance, including on
69/2.

Musculoskeletal: Remote left rib fractures. Moderate thoracic
spondylosis.
IMPRESSION: 1. Lung-RADS 2, benign appearance or behavior. Continue annual
screening with low-dose chest CT without contrast in 12 months.
2. Coronary artery atherosclerosis.
3. Atypical appearance of the upper pole of the left kidney, only
minimally imaged. This could simply represent volume averaging of
parenchyma and perirenal fat. Unless the patient has outside
abdominal imaging to further evaluate this area, consider dedicated
renal ultrasound to exclude unlikely renal mass or cortical atrophy.

Aortic Atherosclerosis (7P3GR-SIH.H) and Emphysema (7P3GR-QHZ.N).

## 2020-12-16 ENCOUNTER — Encounter: Payer: Self-pay | Admitting: Pulmonary Disease

## 2020-12-16 ENCOUNTER — Ambulatory Visit: Payer: 59 | Admitting: Pulmonary Disease

## 2020-12-16 ENCOUNTER — Other Ambulatory Visit: Payer: Self-pay

## 2020-12-16 VITALS — BP 134/66 | HR 104 | Temp 97.0°F | Ht 71.0 in | Wt 332.0 lb

## 2020-12-16 DIAGNOSIS — J449 Chronic obstructive pulmonary disease, unspecified: Secondary | ICD-10-CM | POA: Diagnosis not present

## 2020-12-16 DIAGNOSIS — Z720 Tobacco use: Secondary | ICD-10-CM | POA: Diagnosis not present

## 2020-12-16 MED ORDER — TRELEGY ELLIPTA 200-62.5-25 MCG/INH IN AEPB: 1.0000 | INHALATION_SPRAY | Freq: Every day | RESPIRATORY_TRACT | 0 refills | Status: DC

## 2020-12-16 NOTE — Patient Instructions (Addendum)
Glad you are doing well with your breathing We will give you a sample of Trelegy 200 inhaler If you like this then we can call in a prescription Agree with exercise and rehab program  We will put a referral for resumption of routine screening CTs of the chest in May 2022  Follow-up in 6 months.

## 2020-12-16 NOTE — Progress Notes (Signed)
Nicholas Foley    [id number]    February 20, 1951  Primary Care Physician:Webb, Arbie Cookey, MD  Referring Physician: [name], MD [address] [place],   [address]  Chief complaint: Consult for COPD  HPI: 70 year old with history of fibrillation, CHF, COPD, morbid obesity, severe OSA Sent here for evaluation of his lungs.  He has got obstruction by PFTs in 2014.  Maintained on fluticasone, salmeterol inhaler which is generic form of Advair. He has had significant issues with his heart recently with atrial fibrillation, chronic CHF, nonischemic cardiomyopathy [EF 35-40%].  Had implantation of a biventricular pacemaker in January 2019.  He has severe OSA and is followed by Dr. Radford Pax, cardiology  Chief complaint today is dyspnea on exertion, he has symptoms sometimes at rest.  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  Quit in 2013 Travel history: No significant travel Relevant family history: Significant family history of emphysema  Interim history: Lost to follow-up since 2020 He had 2 episodes of COVID-19 in December 2020 and again in late 2021.  Did not require hospitalizations with both infection and the last infection was very mild. He had been seen by post Covid clinic at [facility].  CT scan in May 2020 did not show any evidence of post Covid pneumonitis or ILD.  He was started on Anoro at last visit but went back to advair as he felt advair worked better for him Continues to have dyspnea on exertion Forced with Dr. Haroldine Laws for heart failure, atrial fibrillation.  Outpatient Encounter Medications as of 12/16/2020  Medication Sig  . acetaminophen (TYLENOL) 650 MG CR tablet Take 1,300 mg by mouth every 8 (eight) hours as needed for pain.   [name] ADVAIR DISKUS 250-50 MCG/DOSE AEPB Inhale 1  puff into the lungs 2 (two) times daily.  [name] apixaban (ELIQUIS) 5 MG TABS tablet Take 1 tablet (5 mg total) by mouth 2 (two) times daily.  . carvedilol (COREG) 12.5 MG tablet Take 1 tablet (12.5 mg total) by mouth 2 (two) times daily with a meal.  . cholecalciferol (VITAMIN D3) 25 MCG (1000 UT) tablet Take 1,000 Units by mouth daily.  [name] CIALIS 20 MG tablet Take 20 mg by mouth daily as needed for erectile dysfunction.   . cyclobenzaprine (FLEXERIL) 10 MG tablet Take 10 mg by mouth as needed.  [name] ENTRESTO 97-103 MG TAKE 1 TABLET BY MOUTH TWICE DAILY  . fluticasone (FLONASE) 50 MCG/ACT nasal spray Place 1 spray into both nostrils 2 (two) times daily.   . furosemide (LASIX) 40 MG tablet TAKE 2 TABLETS(80 MG) BY MOUTH TWICE DAILY  . omeprazole (PRILOSEC OTC) 20 MG tablet Take 20 mg by mouth daily.  . potassium chloride SA (KLOR-CON) 20 MEQ tablet Take 1 tablet (20 mEq total) by mouth daily.  [name] spironolactone (ALDACTONE) 25 MG tablet Take 1 tablet (25 mg total) by mouth daily.  . vitamin B-12 (CYANOCOBALAMIN) 1000 MCG tablet Take 1,000 mcg by mouth daily.  [name] zolpidem (AMBIEN CR) 12.5 MG CR tablet Take 1 tablet by mouth at bedtime as needed for sleep.   . [DISCONTINUED] gatifloxacin (ZYMAXID) 0.5 % SOLN Place 1 drop into the right eye 4 (four) times daily.   No facility-administered encounter medications on file as of 12/16/2020.   Physical Exam: Blood pressure 134/66, pulse [name])  104, temperature (!) 97 F (36.1 C), temperature source Skin, height 5\' 11"  (1.803 m), weight (!) 332 lb (150.6 kg), SpO2 97 %. Gen:      No acute distress HEENT:  EOMI, sclera anicteric Neck:     No masses; no thyromegaly Lungs:    Clear to auscultation bilaterally; normal respiratory effort CV:         Regular rate and rhythm; no murmurs Abd:      + bowel sounds; soft, non-tender; no palpable masses, no distension Ext:    No edema; adequate peripheral perfusion Skin:      Warm and dry; no rash Neuro: alert and oriented x  3 Psych: normal mood and affect  Data Reviewed: Imaging: CT scan 04/17/2020-emphysema, aortic atherosclerosis, stable pulmonary nodule. I have reviewed the images personally.  PFTs: 03/26/2013 FVC 4.37 [94%], FEV1 2.65 [81%), F/F 61, TLC 121%, DLCO 90% Mild obstructive airway disease with hyperinflation and air trapping.  No bronchodilator response  05/18/19 FVC 3.23 (69%], FEV1 2.32 (65%), F/F 72, TLC 7.02 [97%], DLCO 23.7 [85%] Moderate obstructive artery disease with air trapping  10/24/2019 FVC 2.46 [73%], FEV1 2.50 [21%], F/F 72, TLC 6.90 [94%], DLCO 24.06 [88%] Moderate obstruction  Labs CBC 11/04/2019-WBC 7.7, eos 2.1%, absolute eosinophil count, 162 IgE 01/04/2019- 857 Alpha-1 antitrypsin 01/04/2019- 161, PI MM  Assessment:  COPD PFTs reviewed with moderate obstruction He is on the Advair now.  He did not feel that anoro helped  We will give him samples of Trelegy.  If he likes this then we can call in a prescription Encouraged to lose weight with diet and exercise.  He is due to start rehab therapy soon  Ex-smoker Resume low-dose screening CTs of the chest starting May 2022  Severe OSA On CPAP.  Follows with Dr. Radford Pax.  Health maintenance Up-to-date with vaccination  Plan/Recommendations: - Trelegy sample - Low-dose screening CT of the chest  Marshell Garfinkel MD [facility] 12/16/2020, 9:29 AM  CC: [name], MD

## 2020-12-16 NOTE — Addendum Note (Signed)
Addended by: Elton Sin on: 12/16/2020 02:50 PM   Modules accepted: Orders

## 2020-12-16 NOTE — Addendum Note (Signed)
Addended by: Elton Sin on: 12/16/2020 09:49 AM   Modules accepted: Orders

## 2020-12-22 ENCOUNTER — Other Ambulatory Visit: Payer: Self-pay | Admitting: Interventional Cardiology

## 2020-12-23 ENCOUNTER — Ambulatory Visit (INDEPENDENT_AMBULATORY_CARE_PROVIDER_SITE_OTHER): Payer: 59

## 2020-12-23 ENCOUNTER — Other Ambulatory Visit: Payer: Self-pay | Admitting: Interventional Cardiology

## 2020-12-23 DIAGNOSIS — Z950 Presence of cardiac pacemaker: Secondary | ICD-10-CM

## 2020-12-23 DIAGNOSIS — I5042 Chronic combined systolic (congestive) and diastolic (congestive) heart failure: Secondary | ICD-10-CM | POA: Diagnosis not present

## 2020-12-23 NOTE — Progress Notes (Unsigned)
EPIC Encounter for ICM Monitoring  Patient Name: Nicholas Foley is a 70 y.o. male Date: 12/23/2020 Primary Care Physican: [name], MD Primary Cardiologist:Smith/Bensimhon Electrophysiologist:Allred Bi-V Pacing:96.2% 10/15/2021OfficeWeight: 336lbs (does not weigh at home)  Time in AT/AF <0.1 hr/day (<0.1%)   Spoke with patient and reports he had Covid for the 2nd time within the last month.  The 2nd Covid was mild compared to the first time he had COVID which he still is experiencing long term effects.  He has not feet well in the last month but starting PT 2-3 times a week that starts today.  He feels like he has some fluid and thinks getting more exercise will help.    Optivolthoracic impedancesuggesting possible fluid accumulation since 12/11/2020 but has started to trend back toward baseline.  Prescribed:  Furosemide40 mg take2tablets (80 mgtotal)twice a day.(09/30/20 pt reports Dr [name] & Dr Haroldine Laws have both approved he may take extra Furosemide and he only takes when absolutely necessary)  Potassium 20 mEq take1tablet daily  Labs: 08/26/2020 Creatinine 1.47, BUN 22, Potassium 4.2, Sodium 138, GFR 49-56 03/13/2020 Creatinine1.08, BUN 18, Potassium 4.0, Sodium 138, GFR >60 01/15/2020 Creatinine 1.40, BUN 19, Potassium 4.2, Sodium 139, GFR 52-60 12/27/2019 Creatinine 1.21, BUN 16, Potassium 3.8, Sodium 138, GFR >60, BNP 137.8 A complete set of results can be found in Results Review.  Recommendations:   Follow-up plan: ICM clinic phone appointment on2/14/2022 to recheck fluid levels.91 day device clinic remote transmission3/12/2020.  EP/Cardiology Office Visits:2/16/2022with [place].03/16/2021 with Dr [name].  Recall for2/23/2022 with Dr [name].  Copy of ICM check sent to Dr.Allredand Dr Haroldine Laws for review and recommendations if needed.  3 month ICM trend:  12/23/2020.    1 Year ICM trend:       Rosalene Billings, RN 12/23/2020 3:50 PM

## 2020-12-24 MED ORDER — METOLAZONE 2.5 MG PO TABS: ORAL_TABLET | ORAL | 0 refills | Status: DC

## 2020-12-24 NOTE — Progress Notes (Signed)
Message Received: Today Bensimhon, Shaune Pascal, MD  Short, Laurie Panda, RN; Thompson Grayer, MD Lets try one dose of metolazone 2.5 with KCL 40   Thanks

## 2020-12-24 NOTE — Progress Notes (Signed)
Spoke with patient and advised of Dr Bensimhon's recommendation to take Metolazone 2.5 mg tablet one time dose with additional KCL 40 mEq 30 minutes prior to taking daily Furosemide dosage. Explained he should take the Metolazone on a day that he will be at home since it is more powerful diuretic than Furosemide.  He verbalized understanding.  Will send scrip to preferred pharmacy.  Patient has extra Potassium on hand.  Patient verbalized understanding and repeated instructions back correctly.  Will recheck remote transmission fluid levels on 12/30/2020 instead of 01/05/2021 to check effectiveness of Metolazone.

## 2020-12-25 ENCOUNTER — Other Ambulatory Visit: Payer: Self-pay

## 2020-12-25 MED ORDER — ENTRESTO 97-103 MG PO TABS
1.0000 | ORAL_TABLET | Freq: Two times a day (BID) | ORAL | 3 refills | Status: DC
Start: 2020-12-25 — End: 2021-12-09

## 2020-12-25 NOTE — Telephone Encounter (Signed)
This is a CHF pt 

## 2020-12-31 ENCOUNTER — Other Ambulatory Visit: Payer: Self-pay | Admitting: *Deleted

## 2020-12-31 DIAGNOSIS — Z87891 Personal history of nicotine dependence: Secondary | ICD-10-CM

## 2021-01-05 ENCOUNTER — Ambulatory Visit (INDEPENDENT_AMBULATORY_CARE_PROVIDER_SITE_OTHER): Payer: 59

## 2021-01-05 DIAGNOSIS — Z950 Presence of cardiac pacemaker: Secondary | ICD-10-CM

## 2021-01-05 DIAGNOSIS — I5042 Chronic combined systolic (congestive) and diastolic (congestive) heart failure: Secondary | ICD-10-CM

## 2021-01-06 NOTE — Progress Notes (Signed)
Advanced Heart Failure Clinic Note   Date:  01/06/2021   ID:  Nicholas Foley, DOB [birth year], MRN [id number]  Location: Home  Provider location: [facility] Type of Visit: Established patient  PCP:  [name], MD  Cardiologist:  [name], MD Primary HF: Bob [place]  Chief Complaint: Heart Failure follow-up    History of Present Illness:  Nicholas Foley is a 70 year old with morbid obesity, chronic systolic heart failure, COPD, OSA, PAF, previously on amio-> stopped 2018,  HTN, LBBB, Medtronic CRT-P, and former smoker.  He has h/o NICM which seems to date back to 2014. Had cardiac cath in 1/20 with no CAD and well compensated filling pressures.   Admitted in 1/20 for CRT-P device in setting of LBBB. Intially he felt a lot better but has gradually declined.   Had COVID 12/20. Was sick but didn't get admitted. Has been struggling to get better from [place] and has seen post-COVID Clinic at [facility]. CT 5/21 no evidence of post COVID fibrosis/inflammation. No PE.   Here for f/u. Got COVID again in 1/22 but was a mild case. Now recovering. Starting to get back to normal. Back at work. Can go up and down steps. No edema, orthopnea or PND. Taking lasix 80 bid. Occasioanlly takes extra   Echo today EF 45% (in some images 45-50%) Limited by septal paradox. Personally reviewed   Echo 01/30/20 EF 40-45% (read as 35-40%)  ICD: No VT/AF. Fluid trending back up.100% biv pacing. Personally reviewed    Cardiac Studies CPX 09/18/19  Limited by obesity and mild heart failure Peak VO2 15.1  (80% predicted peak VO2) - when corrected to ibw pVO2 27.5 Slope 33 RER 1.02   LHC/RHC 11/2018 No coronary disease.  RA 13 PA 37/20 (27)  PCWP 14 CO 8.5 CI 3.3   ECHO 09/27/2019 -RV normal EF 45-50%  ECHO 09/2018 -EF 35-40% Grade II DD      Past Medical History:  Diagnosis Date  . Atrial fibrillation with rapid ventricular response ([place] [facility]) 08/02/2013  . CHF  (congestive heart failure) ([place])   . Chronic systolic dysfunction of left ventricle 08/02/2013   BNP greater than 2000   . COPD (chronic obstructive pulmonary disease) ([facility])    Moderate by PFTs with response to bronchodilators, May 2014  . Erectile dysfunction   . Hypertension   . Insomnia 08/02/13  . Left bundle branch block 08/02/2013  . Morbid obesity ([place]) 08/02/2013  . OSA (obstructive sleep apnea)    severe OSA with AHI 74/hr now on CPAP at 8cm H2O   Past Surgical History:  Procedure Laterality Date  . BIV PACEMAKER INSERTION CRT-P N/A 12/14/2018   Medtronic model [id number] Percepta Quad CRT-P MRI Rolan Bucco (serial Number [id number]) device implanted by Dr [name] for CHF and LBBB  . CARDIOVERSION N/A 08/06/2013   Procedure: CARDIOVERSION;  Surgeon: Lelon Perla, MD;  Location: [facility] ENDOSCOPY;  Service: Cardiovascular;  Laterality: N/A;  spoke with [name]   . CARDIOVERSION N/A 09/03/2013   Procedure: CARDIOVERSION;  Surgeon: [name], MD;  Location: [facility] ENDOSCOPY;  Service: Cardiovascular;  Laterality: N/A;  . chronic systolic dysfu    . NASAL SEPTUM SURGERY    . RIGHT/LEFT HEART CATH AND CORONARY ANGIOGRAPHY N/A 12/04/2018   Procedure: RIGHT/LEFT HEART CATH AND CORONARY ANGIOGRAPHY;  Surgeon: Belva Crome, MD;  Location: [place] [facility] CV LAB;  Service: Cardiovascular;  Laterality: N/A;  . TEE WITHOUT CARDIOVERSION N/A  08/06/2013   Procedure: TRANSESOPHAGEAL ECHOCARDIOGRAM (TEE);  Surgeon: Lelon Perla, MD;  Location: [facility] ENDOSCOPY;  Service: Cardiovascular;  Laterality: N/A;  . VASECTOMY       Current Outpatient Medications  Medication Sig Dispense Refill  . acetaminophen (TYLENOL) 650 MG CR tablet Take 1,300 mg by mouth every 8 (eight) hours as needed for pain.     [name] ADVAIR DISKUS 250-50 MCG/DOSE AEPB Inhale 1 puff into the lungs 2 (two) times daily.    [name] apixaban (ELIQUIS) 5 MG TABS tablet Take 1 tablet (5 mg total) by mouth 2 (two) times daily. 180 tablet 1  .  carvedilol (COREG) 12.5 MG tablet Take 1 tablet (12.5 mg total) by mouth 2 (two) times daily with a meal. 60 tablet 6  . cholecalciferol (VITAMIN D3) 25 MCG (1000 UT) tablet Take 1,000 Units by mouth daily.    [name] CIALIS 20 MG tablet Take 20 mg by mouth daily as needed for erectile dysfunction.   0  . cyclobenzaprine (FLEXERIL) 10 MG tablet Take 10 mg by mouth as needed.    . fluticasone (FLONASE) 50 MCG/ACT nasal spray Place 1 spray into both nostrils 2 (two) times daily.   0  . Fluticasone-Umeclidin-Vilant (TRELEGY ELLIPTA) 200-62.5-25 MCG/INH AEPB Inhale 1 puff into the lungs daily. 14 each 0  . furosemide (LASIX) 40 MG tablet TAKE 2 TABLETS(80 MG) BY MOUTH TWICE DAILY 360 tablet 1  . metolazone (ZAROXOLYN) 2.5 MG tablet Take 1 tablet (2.5 mg total) for one dose only, 30 minutes prior to taking Furosemide, with extra Potassium 40 mEq. 1 tablet 0  . omeprazole (PRILOSEC OTC) 20 MG tablet Take 20 mg by mouth daily.    . potassium chloride SA (KLOR-CON) 20 MEQ tablet Take 1 tablet (20 mEq total) by mouth daily. 90 tablet 3  . sacubitril-valsartan (ENTRESTO) 97-103 MG Take 1 tablet by mouth 2 (two) times daily. 180 tablet 3  . spironolactone (ALDACTONE) 25 MG tablet Take 1 tablet (25 mg total) by mouth daily. 90 tablet 3  . vitamin B-12 (CYANOCOBALAMIN) 1000 MCG tablet Take 1,000 mcg by mouth daily.    [name] zolpidem (AMBIEN CR) 12.5 MG CR tablet Take 1 tablet by mouth at bedtime as needed for sleep.      No current facility-administered medications for this encounter.    Allergies:   Patient has no known allergies.   Social History:  The patient  reports that he quit smoking about 8 years ago. His smoking use included cigarettes and cigars. He has a 64.50 pack-year smoking history. He has never used smokeless tobacco. He reports current alcohol use. He reports that he does not use drugs.   Family History:  The patient's family history includes Atrial fibrillation in his mother; COPD in his father;  Cerebral aneurysm in his brother; Healthy in his daughter.   ROS:  Please see the history of present illness.   All other systems are personally reviewed and negative.   Vitals:   01/07/21 1006  BP: 128/80  Pulse: 84  SpO2: 95%    PHYSICAL EXAM: General:  Obese Well appearing. No resp difficulty  HEENT: normal Neck: supple. no JVD. Carotids 2+ bilat; no bruits. No lymphadenopathy or thryomegaly appreciated. Cor: PMI nondisplaced. Regular rate & rhythm. No rubs, gallops or murmurs. Lungs: clear Abdomen: soft, nontender, nondistended. No hepatosplenomegaly. No bruits or masses. Good bowel sounds. Extremities: no cyanosis, clubbing, rash, edema Neuro: alert & orientedx3, cranial nerves grossly intact. moves all 4 extremities w/o difficulty.  Affect pleasant  Recent Labs: 01/15/2020: Magnesium 2.1 03/13/2020: B Natriuretic Peptide 78.8 08/27/2020: BUN 22; Creatinine, Ser 1.47; Hemoglobin 14.2; Platelets 293; Potassium 4.2; Sodium 138  Personally reviewed   Wt Readings from Last 3 Encounters:  01/07/21 (!) 150.5 kg (331 lb 12.8 oz)  12/16/20 (!) 150.6 kg (332 lb)  09/05/20 (!) 152.5 kg (336 lb 3.2 oz)      ASSESSMENT AND PLAN:  1. Chronic Combined Systolic/Diastolic HF - NICM (thought due to LBBB). 12/2018 LHC no coronary disease.  - s/p Medtronic CRT-P - ECHO 11/2017 EF 35-40%. ECHO 11/19 EF 45-50%.  - Echo 11/20 read as 35-40% but with Definity I felt closed to 45% - Echo 3/21 EF read as 35-40% I think 40-45% - CPX test 10/20 suboptimal effort mostly limited to obesity  - ICD interrogation done personally. Results as above - Stable NYHA II/ Volume status mildly up on device - Continue Lasix 80 bid - Continue carvedilol to 12.5 mg twice a day - Continue Entresto 97-103 mg twice a day.  - Continue spiro 25 mg daily. - Start Jadiance 10 mg daily - Check labs  2. PAF  - In the past he was on amiodarone. Stopped ~2017 - No AF on device today - Continue Eliquis 5 bid. No  bleeding  3. OSA - Continue CPAP  4. Obesity -  Body mass index is 46.28 kg/m.  -  Continue weight loss efforts  5. COPD  - no longer smoking  6. CKD 3a - start Jardiance 10    Signed, Glori Bickers, MD  01/06/2021 10:16 PM  Advanced Heart Failure [place] [address] [facility] and [place] [address] [phone] (office) [phone] (fax)

## 2021-01-07 ENCOUNTER — Ambulatory Visit (HOSPITAL_COMMUNITY)
Admission: RE | Admit: 2021-01-07 | Discharge: 2021-01-07 | Disposition: A | Discharge status: Home / Self Care | Payer: 59 | Source: Ambulatory Visit | Attending: Internal Medicine | Admitting: Internal Medicine

## 2021-01-07 ENCOUNTER — Other Ambulatory Visit (HOSPITAL_COMMUNITY): Payer: Self-pay

## 2021-01-07 ENCOUNTER — Encounter (HOSPITAL_COMMUNITY): Payer: Self-pay | Admitting: Internal Medicine

## 2021-01-07 ENCOUNTER — Other Ambulatory Visit: Payer: Self-pay

## 2021-01-07 ENCOUNTER — Ambulatory Visit (HOSPITAL_BASED_OUTPATIENT_CLINIC_OR_DEPARTMENT_OTHER)
Admission: RE | Admit: 2021-01-07 | Discharge: 2021-01-07 | Disposition: A | Discharge status: Home / Self Care | Payer: 59 | Source: Ambulatory Visit | Attending: Internal Medicine | Admitting: Internal Medicine

## 2021-01-07 VITALS — BP 128/80 | HR 84 | Wt 331.8 lb

## 2021-01-07 DIAGNOSIS — G4733 Obstructive sleep apnea (adult) (pediatric): Secondary | ICD-10-CM

## 2021-01-07 DIAGNOSIS — Z6841 Body Mass Index (BMI) 40.0 and over, adult: Secondary | ICD-10-CM | POA: Insufficient documentation

## 2021-01-07 DIAGNOSIS — Z79899 Other long term (current) drug therapy: Secondary | ICD-10-CM | POA: Insufficient documentation

## 2021-01-07 DIAGNOSIS — I447 Left bundle-branch block, unspecified: Secondary | ICD-10-CM | POA: Diagnosis not present

## 2021-01-07 DIAGNOSIS — J449 Chronic obstructive pulmonary disease, unspecified: Secondary | ICD-10-CM | POA: Diagnosis not present

## 2021-01-07 DIAGNOSIS — Z950 Presence of cardiac pacemaker: Secondary | ICD-10-CM

## 2021-01-07 DIAGNOSIS — I130 Hypertensive heart and chronic kidney disease with heart failure and stage 1 through stage 4 chronic kidney disease, or unspecified chronic kidney disease: Secondary | ICD-10-CM | POA: Diagnosis not present

## 2021-01-07 DIAGNOSIS — I5022 Chronic systolic (congestive) heart failure: Secondary | ICD-10-CM | POA: Diagnosis not present

## 2021-01-07 DIAGNOSIS — Z7951 Long term (current) use of inhaled steroids: Secondary | ICD-10-CM | POA: Insufficient documentation

## 2021-01-07 DIAGNOSIS — N1831 Chronic kidney disease, stage 3a: Secondary | ICD-10-CM | POA: Diagnosis not present

## 2021-01-07 DIAGNOSIS — I5042 Chronic combined systolic (congestive) and diastolic (congestive) heart failure: Secondary | ICD-10-CM | POA: Diagnosis not present

## 2021-01-07 DIAGNOSIS — I480 Paroxysmal atrial fibrillation: Secondary | ICD-10-CM | POA: Insufficient documentation

## 2021-01-07 DIAGNOSIS — Z87891 Personal history of nicotine dependence: Secondary | ICD-10-CM | POA: Insufficient documentation

## 2021-01-07 DIAGNOSIS — I428 Other cardiomyopathies: Secondary | ICD-10-CM | POA: Diagnosis not present

## 2021-01-07 DIAGNOSIS — Z7901 Long term (current) use of anticoagulants: Secondary | ICD-10-CM | POA: Insufficient documentation

## 2021-01-07 DIAGNOSIS — Z8616 Personal history of COVID-19: Secondary | ICD-10-CM | POA: Diagnosis not present

## 2021-01-07 LAB — BASIC METABOLIC PANEL
Anion gap: 12 (ref 5–15)
BUN: 17 mg/dL (ref 8–23)
CO2: 22 mmol/L (ref 22–32)
Calcium: 8.8 mg/dL — ABNORMAL LOW (ref 8.9–10.3)
Chloride: 105 mmol/L (ref 98–111)
Creatinine, Ser: 1.2 mg/dL (ref 0.61–1.24)
GFR, Estimated: >60 mL/min (ref >60)
Glucose, Bld: 111 mg/dL — ABNORMAL HIGH (ref 70–99)
Potassium: 3.7 mmol/L (ref 3.5–5.1)
Sodium: 139 mmol/L (ref 135–145)

## 2021-01-07 LAB — BRAIN NATRIURETIC PEPTIDE: B Natriuretic Peptide: 103.7 pg/mL — ABNORMAL HIGH (ref 0.0–100.0)

## 2021-01-07 LAB — CBC
HCT: 39.1 % (ref 39.0–52.0)
Hemoglobin: 13 g/dL (ref 13.0–17.0)
MCH: 31.3 pg (ref 26.0–34.0)
MCHC: 33.2 g/dL (ref 30.0–36.0)
MCV: 94 fL (ref 80.0–100.0)
Platelets: 233 10*3/uL (ref 150–400)
RBC: 4.16 MIL/uL — ABNORMAL LOW (ref 4.22–5.81)
RDW: 12.6 % (ref 11.5–15.5)
WBC: 7.1 10*3/uL (ref 4.0–10.5)
nRBC: 0 % (ref 0.0–0.2)

## 2021-01-07 LAB — ECHOCARDIOGRAM COMPLETE: S' Lateral: 4.6 cm

## 2021-01-07 MED ORDER — EMPAGLIFLOZIN 10 MG PO TABS: 10.0000 mg | ORAL_TABLET | Freq: Every day | ORAL | 3 refills | Status: DC

## 2021-01-07 NOTE — Addendum Note (Signed)
Encounter addended by: Malena Edman, RN on: 01/07/2021 11:09 AM  Actions taken: Order list changed

## 2021-01-07 NOTE — Addendum Note (Signed)
Encounter addended by: Malena Edman, RN on: 01/07/2021 10:50 AM  Actions taken: Order list changed, Diagnosis association updated, Charge Capture section accepted

## 2021-01-07 NOTE — Patient Instructions (Signed)
START Jardiance 10mg  (1 tablet) daily  Labs done today, your results will be available in MyChart, we will contact you for abnormal readings.  Please call our office in July 2022 to schedule your follow up appointment   If you have any questions or concerns before your next appointment please send [place] a message through [place] or call our office at [phone].    TO LEAVE A MESSAGE FOR THE NURSE SELECT OPTION 2, PLEASE LEAVE A MESSAGE INCLUDING: . YOUR NAME . DATE OF BIRTH . CALL BACK NUMBER . REASON FOR CALL**this is important as we prioritize the call backs  YOU WILL RECEIVE A CALL BACK THE SAME DAY AS LONG AS YOU CALL BEFORE 4:00 PM

## 2021-01-07 NOTE — Progress Notes (Signed)
Echocardiogram 2D Echocardiogram has been performed.  Nicholas Foley 01/07/2021, 9:35 AM

## 2021-01-09 NOTE — Progress Notes (Signed)
EPIC Encounter for ICM Monitoring  Patient Name: Nicholas Foley is a 70 y.o. male Date: 01/09/2021 Primary Care Physican: [name], MD Primary Cardiologist:Smith/Bensimhon Electrophysiologist:Allred Bi-V Pacing:96.1% 01/07/2021 OfficeWeight: 331lbs (does not weigh at home)  Time in AT/AF 0.0 hr/day (0.0%)   Transmission reviewed.   Optivolthoracic impedancesuggesting fluid level returned to normal after taking Metolazone but started to trending below baseline suggesting fluid accumulation occurring again.  Prescribed:  Furosemide40 mg take2tablets(80 mgtotal)twice a day.  Potassium 20 mEq take1tablet daily  Labs: 01/07/2021 Creatinine 1.20, BUN 17, Potassium 3.7, Sodium 139, GFR >60 08/26/2020 Creatinine 1.47, BUN 22, Potassium 4.2, Sodium 138, GFR 49-56 03/13/2020 Creatinine1.08, BUN 18, Potassium 4.0, Sodium 138, GFR >60 01/15/2020 Creatinine 1.40, BUN 19, Potassium 4.2, Sodium 139, GFR 52-60 12/27/2019 Creatinine 1.21, BUN 16, Potassium 3.8, Sodium 138, GFR >60, BNP 137.8 A complete set of results can be found in Results Review.  Recommendations:  Recommendations given at [place] with Dr Haroldine Laws 01/07/2021  Follow-up plan: ICM clinic phone appointment on3/05/2021.91 day device clinic remote transmission3/12/2020.  EP/Cardiology Office Visits: 03/16/2021 with Dr [name].  Recall for2/23/2022 with Dr [name].  Copy of ICM check sent to Dr.Allred  3 month ICM trend: 01/07/2021.    1 Year ICM trend:       Rosalene Billings, RN 01/09/2021 2:14 PM

## 2021-01-15 ENCOUNTER — Telehealth (HOSPITAL_COMMUNITY): Payer: Self-pay | Admitting: Pharmacy Technician

## 2021-01-15 NOTE — Telephone Encounter (Signed)
Advanced Heart Failure Patient Advocate Encounter  Prior Authorization for Nicholas Foley has been approved.    PA#  [id number] Effective dates: 01/15/21 through 01/15/22  Patients co-pay is $35  Nicholas Foley, CPhT

## 2021-01-15 NOTE — Telephone Encounter (Signed)
Patient Advocate Encounter   Received notification from [employer] that prior authorization for Nicholas Foley is required.   PA submitted on CoverMyMeds Key [id number] Status is pending   Will continue to follow.

## 2021-01-21 ENCOUNTER — Ambulatory Visit (INDEPENDENT_AMBULATORY_CARE_PROVIDER_SITE_OTHER): Payer: 59

## 2021-01-21 DIAGNOSIS — I5022 Chronic systolic (congestive) heart failure: Secondary | ICD-10-CM | POA: Diagnosis not present

## 2021-01-21 DIAGNOSIS — I428 Other cardiomyopathies: Secondary | ICD-10-CM

## 2021-01-23 LAB — CUP PACEART REMOTE DEVICE CHECK
Battery Remaining Longevity: 106 mo
Battery Voltage: 3 V
Brady Statistic AP VP Percent: 0.52 %
Brady Statistic AP VS Percent: 0.03 %
Brady Statistic AS VP Percent: 95.38 %
Brady Statistic AS VS Percent: 4.07 %
Brady Statistic RA Percent Paced: 0.55 %
Brady Statistic RV Percent Paced: 49.45 %
Date Time Interrogation Session: 20220301201237
Implantable Lead Implant Date: 20200123
Implantable Lead Implant Date: 20200123
Implantable Lead Implant Date: 20200123
Implantable Lead Location: 753858
Implantable Lead Location: 753859
Implantable Lead Location: 753860
Implantable Lead Model: 4598
Implantable Lead Model: 5076
Implantable Lead Model: 5076
Implantable Pulse Generator Implant Date: 20200123
Lead Channel Impedance Value: 1007 Ohm
Lead Channel Impedance Value: 1045 Ohm
Lead Channel Impedance Value: 1273 Ohm
Lead Channel Impedance Value: 1330 Ohm
Lead Channel Impedance Value: 361 Ohm
Lead Channel Impedance Value: 418 Ohm
Lead Channel Impedance Value: 456 Ohm
Lead Channel Impedance Value: 456 Ohm
Lead Channel Impedance Value: 532 Ohm
Lead Channel Impedance Value: 665 Ohm
Lead Channel Impedance Value: 741 Ohm
Lead Channel Impedance Value: 760 Ohm
Lead Channel Impedance Value: 950 Ohm
Lead Channel Impedance Value: 969 Ohm
Lead Channel Pacing Threshold Amplitude: 0.5 V
Lead Channel Pacing Threshold Amplitude: 0.875 V
Lead Channel Pacing Threshold Amplitude: 1.875 V
Lead Channel Pacing Threshold Pulse Width: 0.4 ms
Lead Channel Pacing Threshold Pulse Width: 0.4 ms
Lead Channel Pacing Threshold Pulse Width: 0.5 ms
Lead Channel Sensing Intrinsic Amplitude: 0.625 mV
Lead Channel Sensing Intrinsic Amplitude: 0.625 mV
Lead Channel Sensing Intrinsic Amplitude: 5.875 mV
Lead Channel Sensing Intrinsic Amplitude: 5.875 mV
Lead Channel Setting Pacing Amplitude: 1.75 V
Lead Channel Setting Pacing Amplitude: 2.5 V
Lead Channel Setting Pacing Amplitude: 3 V
Lead Channel Setting Pacing Pulse Width: 0.4 ms
Lead Channel Setting Pacing Pulse Width: 0.5 ms
Lead Channel Setting Sensing Sensitivity: 2 mV

## 2021-01-26 ENCOUNTER — Ambulatory Visit (INDEPENDENT_AMBULATORY_CARE_PROVIDER_SITE_OTHER): Payer: 59

## 2021-01-26 DIAGNOSIS — Z950 Presence of cardiac pacemaker: Secondary | ICD-10-CM

## 2021-01-26 DIAGNOSIS — I5042 Chronic combined systolic (congestive) and diastolic (congestive) heart failure: Secondary | ICD-10-CM | POA: Diagnosis not present

## 2021-01-28 ENCOUNTER — Telehealth: Payer: Self-pay

## 2021-01-28 NOTE — Progress Notes (Signed)
EPIC Encounter for ICM Monitoring  Patient Name: Nicholas Foley is a 70 y.o. male Date: 01/28/2021 Primary Care Physican: [name], MD Primary Cardiologist:Smith/Bensimhon Electrophysiologist:Allred Bi-V Pacing:96.6% 01/07/2021 OfficeWeight: 331lbs (does not weigh at home)  Time in AT/AF 0.0 hr/day (0.0%)   Attempted call to patient and unable to reach.  Left detailed message per DPR regarding transmission. Transmission reviewed.   Optivolthoracic impedancenormal but was suggesting possible fluid accumulation from 2/11-3/4  Prescribed:  Furosemide40 mg take2tablets(80 mgtotal)twice a day.  Potassium 20 mEq take1tablet daily  Labs: 01/07/2021 Creatinine 1.20, BUN 17, Potassium 3.7, Sodium 139, GFR >60 08/26/2020 Creatinine 1.47, BUN 22, Potassium 4.2, Sodium 138, GFR 49-56 03/13/2020 Creatinine1.08, BUN 18, Potassium 4.0, Sodium 138, GFR >60 01/15/2020 Creatinine 1.40, BUN 19, Potassium 4.2, Sodium 139, GFR 52-60 12/27/2019 Creatinine 1.21, BUN 16, Potassium 3.8, Sodium 138, GFR >60, BNP 137.8 A complete set of results can be found in Results Review.  Recommendations:  Left voice mail with ICM number and encouraged to call if experiencing any fluid symptoms.  Follow-up plan: ICM clinic phone appointment on 03/02/2021.91 day device clinic remote transmission6/11/2020.  EP/Cardiology Office Visits: 03/16/2021 with Dr [name].Recall for2/23/2022 with Dr [name].  Recall 07/06/2021 with Dr Haroldine Laws  Copy of ICM check sent to Dr.Allred.   3 month ICM trend: 01/25/2021.    1 Year ICM trend:       Rosalene Billings, RN 01/28/2021 3:25 PM

## 2021-01-28 NOTE — Telephone Encounter (Signed)
Remote ICM transmission received.  Attempted call to patient regarding ICM remote transmission and left detailed message per DPR.  Advised to return call for any fluid symptoms or questions.  

## 2021-01-29 ENCOUNTER — Other Ambulatory Visit (HOSPITAL_COMMUNITY): Payer: Self-pay

## 2021-01-29 ENCOUNTER — Telehealth: Payer: Self-pay | Admitting: *Deleted

## 2021-01-29 MED ORDER — SPIRONOLACTONE 25 MG PO TABS: 25.0000 mg | ORAL_TABLET | Freq: Every day | ORAL | 3 refills | Status: DC

## 2021-01-29 NOTE — Telephone Encounter (Signed)
   [facility] Pre-operative Risk Assessment    HEARTCARE STAFF: - Please ensure there is not already an duplicate clearance open for this procedure. - Under Visit Info/Reason for Call, type in Other and utilize the format Clearance MM/DD/YY or Clearance TBD. Do not use dashes or single digits. - If request is for dental extraction, please clarify the # of teeth to be extracted.  Request for surgical clearance:  1. What type of surgery is being performed? 1 TOOTH TO BE EXTRACTED; 1 DENTAL IMPLANT AND BONE ADDED PROCEDURE TO BE ALL DONE IN 1 VISIT   2. When is this surgery scheduled? TBD   3. What type of clearance is required (medical clearance vs. Pharmacy clearance to hold med vs. Both)? BOTH  4. Are there any medications that need to be held prior to surgery and how long? ELIQUIS TO BE HELD 3-5 DAYS PRIOR TO PROCEDURE   5. Practice name and name of physician performing surgery? DR. Mikey Bussing, DDS   6. What is the office phone number? [phone]   7.   What is the office fax number? [phone]  8.   Anesthesia type (None, local, MAC, general) ? LOCAL NUMBING   Julaine Hua 01/29/2021, 11:52 AM  _________________________________________________________________   (provider comments below)

## 2021-01-29 NOTE — Telephone Encounter (Signed)
   Primary Cardiologist: [name], MD  Chart reviewed as part of pre-operative protocol coverage. Simple dental extractions are considered low risk procedures per guidelines and generally do not require any specific cardiac clearance. It is also generally accepted that for simple extractions and dental cleanings, there is no need to interrupt blood thinner therapy.   SBE prophylaxis is not required for the patient.  I will route this recommendation to the requesting party via Epic fax function and remove from pre-op pool.  Please call with questions.  Deberah Pelton, NP 01/29/2021, 12:33 PM

## 2021-01-30 NOTE — Progress Notes (Signed)
Remote pacemaker transmission.   

## 2021-02-17 ENCOUNTER — Other Ambulatory Visit: Payer: Self-pay

## 2021-02-17 MED ORDER — APIXABAN 5 MG PO TABS: 5.0000 mg | ORAL_TABLET | Freq: Two times a day (BID) | ORAL | 2 refills | Status: DC

## 2021-02-17 NOTE — Telephone Encounter (Signed)
Dr. Geralynn Ochs is calling for clarification regarding the dental clearance. One page says he does not need to hold Eliquis but another page says the patient does need to hold medicine. Please clarify and fax  [phone]

## 2021-02-17 NOTE — Telephone Encounter (Signed)
Pt last saw Dr Haroldine Laws 01/07/21, last labs 01/07/21 Creat 1.20, age 70, weight 150.5kg, based on specified criteria pt is on appropriate dosage of Eliquis 5mg  BID.  Will refill rx.

## 2021-02-18 NOTE — Telephone Encounter (Signed)
   Primary Cardiologist: [name], MD  Chart reviewed as part of pre-operative protocol coverage. Mr. Jasmin does not need to hold blood thinners prior to his procedure. SBE prophylaxis is not  required for the patient.  I will route this recommendation to the requesting party via Epic fax function and remove from pre-op pool.  Please call with questions.  Deberah Pelton, NP 02/18/2021, 10:05 AM

## 2021-02-22 ENCOUNTER — Other Ambulatory Visit: Payer: Self-pay | Admitting: Interventional Cardiology

## 2021-03-02 ENCOUNTER — Ambulatory Visit (INDEPENDENT_AMBULATORY_CARE_PROVIDER_SITE_OTHER): Payer: 59

## 2021-03-02 DIAGNOSIS — Z950 Presence of cardiac pacemaker: Secondary | ICD-10-CM

## 2021-03-02 DIAGNOSIS — I5042 Chronic combined systolic (congestive) and diastolic (congestive) heart failure: Secondary | ICD-10-CM

## 2021-03-04 NOTE — Progress Notes (Signed)
EPIC Encounter for ICM Monitoring  Patient Name: Nicholas Foley is a 70 y.o. male Date: 03/04/2021 Primary Care Physican: [name], MD Primary Cardiologist:Smith/Bensimhon Electrophysiologist:Allred Bi-V Pacing:93.4% 2/16/2022OfficeWeight: 331lbs (does not weigh at home)  Clinical Status (02-Mar-2021 to 04-Mar-2021) Report Time in AT/AF <0.1 hr/day (<0.1%)  Clinical Status (25-Jan-2021 to 02-Mar-2021) Report Time in AT/AF 0.9 hr/day (3.6%) EVENT SUMMARY: ?    AT/AF Daily Burden > Threshold ?    Possible Fluid Accumulation ?    171 V. Sensing Episodes ?    30 hours in AT/AF Since Last Session   Spoke with patient.  He reports taking extra Torsemide on Sunday 4/10 and feels like the fluid is going.  Impedance shows improvement on 4/12 day and appears fluid accumulation showing improvement  Optivolthoracic impedancesuggesting possible ongoing fluid accumulation since 02/06/2021 but did return to baseline 4/12 after taking extra Torsemide.  On 4/11 report, showing 30 hours of AT/AF.    On 4/13 report no further episodes showing.   Prescribed:  Furosemide40 mg take2tablets(80 mgtotal)twice a day.  Potassium 20 mEq take1tablet daily  Labs: 01/07/2021 Creatinine 1.20, BUN 17, Potassium 3.7, Sodium 139, GFR >60 08/26/2020 Creatinine 1.47, BUN 22, Potassium 4.2, Sodium 138, GFR 49-56 03/13/2020 Creatinine1.08, BUN 18, Potassium 4.0, Sodium 138, GFR >60 01/15/2020 Creatinine 1.40, BUN 19, Potassium 4.2, Sodium 139, GFR 52-60 12/27/2019 Creatinine 1.21, BUN 16, Potassium 3.8, Sodium 138, GFR >60, BNP 137.8 A complete set of results can be found in Results Review.  Recommendations: Patient reports he will take extra Torsemide x 2 days as he has dicussed with Dr Haroldine Laws at previous office visits.    Follow-up plan: ICM clinic phone appointment on 03/06/2021 to recheck fluid levels.91 day  device clinic remote transmission6/11/2020.  EP/Cardiology Office Visits: 03/16/2021 with Dr [name].Recall for2/23/2022 with Dr [name].  Recall 07/06/2021 with Dr Haroldine Laws  Copy of ICM check sent to Dr.Allred.   4/11 Remote Transmission AT/AF through 4/11    3 month ICM trend: 03/04/2021.    1 Year ICM trend:       Rosalene Billings, RN 03/04/2021 1:36 PM

## 2021-03-06 ENCOUNTER — Ambulatory Visit (INDEPENDENT_AMBULATORY_CARE_PROVIDER_SITE_OTHER): Payer: 59

## 2021-03-06 ENCOUNTER — Telehealth: Payer: Self-pay

## 2021-03-06 DIAGNOSIS — I5042 Chronic combined systolic (congestive) and diastolic (congestive) heart failure: Secondary | ICD-10-CM

## 2021-03-06 DIAGNOSIS — Z950 Presence of cardiac pacemaker: Secondary | ICD-10-CM

## 2021-03-06 NOTE — Telephone Encounter (Signed)
Remote ICM transmission received.  Attempted call to patient regarding ICM remote transmission and left detailed message per [facility] return call.  Advised to return call for any fluid symptoms or questions. Next ICM remote transmission scheduled 03/16/2021.

## 2021-03-06 NOTE — Progress Notes (Signed)
EPIC Encounter for ICM Monitoring  Patient Name: Nicholas Foley is a 70 y.o. male Date: 03/06/2021 Primary Care Physican: [name], MD Primary Cardiologist:Smith/Bensimhon Electrophysiologist:Allred Bi-V Pacing:92.2% 2/16/2022OfficeWeight: 331lbs (does not weigh at home)  Time in AT/AF 0.0 hr/day (0.0%) Since 04-Mar-2021   Attempted call to patient and unable to reach.  Left detailed message per DPR regarding transmission. Transmission reviewed.   Optivolthoracic impedancesuggesting fluid levels returned to nomral after taking extra Furosemide.  Prescribed:  Furosemide40 mg take2tablets(80 mgtotal)twice a day.  Potassium 20 mEq take1tablet daily  Labs: 01/07/2021 Creatinine 1.20, BUN 17, Potassium 3.7, Sodium 139, GFR >60 08/26/2020 Creatinine 1.47, BUN 22, Potassium 4.2, Sodium 138, GFR 49-56 03/13/2020 Creatinine1.08, BUN 18, Potassium 4.0, Sodium 138, GFR >60 01/15/2020 Creatinine 1.40, BUN 19, Potassium 4.2, Sodium 139, GFR 52-60 12/27/2019 Creatinine 1.21, BUN 16, Potassium 3.8, Sodium 138, GFR >60, BNP 137.8 A complete set of results can be found in Results Review.  Recommendations:Left voice mail with ICM number and encouraged to call if experiencing any fluid symptoms.   Follow-up plan: ICM clinic phone appointment on4/25/2022 to recheck fluid levels.91 day device clinic remote transmission6/11/2020.  EP/Cardiology Office Visits: 03/16/2021 with Dr [name].Recall for2/23/2022 with Dr [name].Recall 07/06/2021 with Dr Haroldine Laws  Copy of ICM check sent to Dr.Allred and Dr Haroldine Laws as Juluis Rainier.  3 month ICM trend: 03/05/2021.    1 Year ICM trend:       Rosalene Billings, RN 03/06/2021 11:03 AM

## 2021-03-10 ENCOUNTER — Other Ambulatory Visit: Payer: Self-pay | Admitting: *Deleted

## 2021-03-10 DIAGNOSIS — Z87891 Personal history of nicotine dependence: Secondary | ICD-10-CM

## 2021-03-15 NOTE — Progress Notes (Signed)
Cardiology Office Note:    Date:  03/16/2021   ID:  Nicholas Foley, DOB [birth year], MRN [id number]  PCP:  [name], MD  Cardiologist:  [name], MD   Referring MD: [name], MD   Chief Complaint  Patient presents with  . Congestive Heart Failure  . Hypertension  . Chest Pain  . Hyperlipidemia    History of Present Illness:    Nicholas Foley is a 70 y.o. male with a hx of NICM, Chronic CHF (systolic=> EF 40%, 07/8118) ), COPD (prior smoker), OSA (w/CPAP), AFib, HTN, LBBB, CRT-P, morbid obesity, prior amiodarone therapy (DC 2018), and prior smoker.. Suffered COVID-19 infection in December 2020.  He is doing well.  He has been maintaining atrial paced and AV sequential pacing.  No rapid heart heart beating.  Endurance and exertional dyspnea have significantly improved.  He did get into a long COVID program at [facility] that is made a dramatic difference in his overall condition.  Past Medical History:  Diagnosis Date  . Atrial fibrillation with rapid ventricular response ([place]) 08/02/2013  . CHF (congestive heart failure) ([place])   . Chronic systolic dysfunction of left ventricle 08/02/2013   BNP greater than 2000   . COPD (chronic obstructive pulmonary disease) ([facility])    Moderate by PFTs with response to bronchodilators, May 2014  . Erectile dysfunction   . Hypertension   . Insomnia 08/02/13  . Left bundle branch block 08/02/2013  . Morbid obesity ([place]) 08/02/2013  . OSA (obstructive sleep apnea)    severe OSA with AHI 74/hr now on CPAP at 8cm H2O    Past Surgical History:  Procedure Laterality Date  . BIV PACEMAKER INSERTION CRT-P N/A 12/14/2018   Medtronic model [id number] Percepta Quad CRT-P MRI Rolan Bucco (serial Number [id number]) device implanted by Dr [name] for CHF and LBBB  . CARDIOVERSION N/A 08/06/2013   Procedure: CARDIOVERSION;  Surgeon: Lelon Perla, MD;  Location: [facility] ENDOSCOPY;  Service: Cardiovascular;  Laterality: N/A;  spoke with [name]   .  CARDIOVERSION N/A 09/03/2013   Procedure: CARDIOVERSION;  Surgeon: [name], MD;  Location: [facility] ENDOSCOPY;  Service: Cardiovascular;  Laterality: N/A;  . chronic systolic dysfu    . NASAL SEPTUM SURGERY    . RIGHT/LEFT HEART CATH AND CORONARY ANGIOGRAPHY N/A 12/04/2018   Procedure: RIGHT/LEFT HEART CATH AND CORONARY ANGIOGRAPHY;  Surgeon: Belva Crome, MD;  Location: [place] CV LAB;  Service: Cardiovascular;  Laterality: N/A;  . TEE WITHOUT CARDIOVERSION N/A 08/06/2013   Procedure: TRANSESOPHAGEAL ECHOCARDIOGRAM (TEE);  Surgeon: Lelon Perla, MD;  Location: [facility] ENDOSCOPY;  Service: Cardiovascular;  Laterality: N/A;  . VASECTOMY      Current Medications: Current Meds  Medication Sig  . acetaminophen (TYLENOL) 650 MG CR tablet Take 1,300 mg by mouth every 8 (eight) hours as needed for pain.   [name] ADVAIR DISKUS 250-50 MCG/DOSE AEPB Inhale 1 puff into the lungs 2 (two) times daily.  [name] apixaban (ELIQUIS) 5 MG TABS tablet Take 1 tablet (5 mg total) by mouth 2 (two) times daily.  . carvedilol (COREG) 12.5 MG tablet Take 1 tablet (12.5 mg total) by mouth 2 (two) times daily with a meal.  . cholecalciferol (VITAMIN D3) 25 MCG (1000 UT) tablet Take 1,000 Units by mouth daily.  [name] CIALIS 20 MG tablet Take 20 mg by mouth daily as needed for erectile dysfunction.   . cyclobenzaprine (FLEXERIL) 10 MG tablet Take 10 mg by mouth as needed.  [name]  empagliflozin (JARDIANCE) 10 MG TABS tablet Take 1 tablet (10 mg total) by mouth daily before breakfast.  . fluticasone (FLONASE) 50 MCG/ACT nasal spray Place 1 spray into both nostrils 2 (two) times daily.   . furosemide (LASIX) 40 MG tablet TAKE 2 TABLETS(80 MG) BY MOUTH TWICE DAILY  . omeprazole (PRILOSEC OTC) 20 MG tablet Take 20 mg by mouth daily.  . potassium chloride SA (KLOR-CON) 20 MEQ tablet Take 1 tablet (20 mEq total) by mouth daily.  . sacubitril-valsartan (ENTRESTO) 97-103 MG Take 1 tablet by mouth 2 (two) times daily.  [name] spironolactone  (ALDACTONE) 25 MG tablet Take 1 tablet (25 mg total) by mouth daily.  . vitamin B-12 (CYANOCOBALAMIN) 1000 MCG tablet Take 1,000 mcg by mouth daily.  [name] zolpidem (AMBIEN CR) 12.5 MG CR tablet Take 1 tablet by mouth at bedtime as needed for sleep.      Allergies:   Patient has no known allergies.   Social History   Socioeconomic History  . Marital status: Married    Spouse name: Not on file  . Number of children: Not on file  . Years of education: Not on file  . Highest education level: Not on file  Occupational History  . Occupation: real estate    Comment: investment  Tobacco Use  . Smoking status: Former Smoker    Packs/day: 1.50    Years: 43.00    Pack years: 64.50    Types: Cigarettes, Cigars    Quit date: 08/02/2012    Years since quitting: 8.6  . Smokeless tobacco: Never Used  Vaping Use  . Vaping Use: Never used  Substance and Sexual Activity  . Alcohol use: Yes    Alcohol/week: 0.0 standard drinks    Comment: once a week; previously 3-4 times per week   . Drug use: No  . Sexual activity: Yes  Other Topics Concern  . Not on file  Social History Narrative   Works as a Engineer, structural for apartments   Lives with wife.  They have one grown daughter   Highest level of education:  college   Social Determinants of Health   Financial Resource Strain: Not on file  Food Insecurity: Not on file  Transportation Needs: Not on file  Physical Activity: Not on file  Stress: Not on file  Social Connections: Not on file     Family History: The patient's family history includes Atrial fibrillation in his mother; COPD in his father; Cerebral aneurysm in his brother; Healthy in his daughter.  ROS:   Please see the history of present illness.    Morbid obesity.  Not exercising regularly.  Encouraged to get 150 minutes of moderate activity per week.  Compliant with medication regimen which includes Aldactone 25 mg daily.,  Jardiance 10 mg/day, Entresto 97/103 mg twice  per day, and carvedilol 12.5 mg twice daily.  Also on 40 mg of furosemide 2 tablets twice daily.  All other systems reviewed and are negative.  EKGs/Labs/Other Studies Reviewed:    The following studies were reviewed today:  2 D-Doppler Echocardiogram 2022:  IMPRESSIONS  1. Left ventricular ejection fraction, by estimation, is 40%. The left  ventricle has moderately decreased function. The left ventricle  demonstrates global hypokinesis. There is mild concentric left ventricular  hypertrophy. Left ventricular diastolic  parameters are indeterminate.  2. Right ventricular systolic function is normal. The right ventricular  size is normal.  3. The mitral valve is normal in structure. Trivial mitral valve  regurgitation.  4. The aortic valve is tricuspid. Aortic valve regurgitation is not  visualized. No aortic stenosis is present.  5. The inferior vena cava is dilated in size with >50% respiratory  variability, suggesting right atrial pressure of 8 mmHg.   Comparison(s): Compared to prior echo, the LV is less dilated with a LVIDd  6.3cm and EF appears slightly improved to 40%. Otherwise, no significant  change.   EKG:  EKG no new data.  Reviewed most recent Paceart, patient is a pacing frequently.  Recent Labs: 01/07/2021: B Natriuretic Peptide 103.7; BUN 17; Creatinine, Ser 1.20; Hemoglobin 13.0; Platelets 233; Potassium 3.7; Sodium 139  Recent Lipid Panel    Component Value Date/Time   CHOL 184 08/03/2013 0640   TRIG 149 08/03/2013 0640   HDL 50 08/03/2013 0640   CHOLHDL 3.7 08/03/2013 0640   VLDL 30 08/03/2013 0640   LDLCALC 104 (H) 08/03/2013 0640    Physical Exam:    VS:  BP (!) 148/88   Pulse 88   Ht 5\' 11"  (1.803 m)   Wt (!) 327 lb 12.8 oz (148.7 kg)   SpO2 95%   BMI 45.72 kg/m     Wt Readings from Last 3 Encounters:  03/16/21 (!) 327 lb 12.8 oz (148.7 kg)  01/07/21 (!) 331 lb 12.8 oz (150.5 kg)  12/16/20 (!) 332 lb (150.6 kg)     GEN: Markedly  obese.. No acute distress HEENT: Normal NECK: No JVD. LYMPHATICS: No lymphadenopathy CARDIAC: No murmur. RRR no gallop, or edema. VASCULAR:  Normal Pulses. No bruits. RESPIRATORY:  Clear to auscultation without rales, wheezing or rhonchi  ABDOMEN: Soft, non-tender, non-distended, No pulsatile mass, MUSCULOSKELETAL: No deformity  SKIN: Warm and dry NEUROLOGIC:  Alert and oriented x 3 PSYCHIATRIC:  Normal affect   ASSESSMENT:    1. Chronic combined systolic and diastolic heart failure ([place])   2. Biventricular cardiac pacemaker in situ   3. OSA (obstructive sleep apnea)   4. Morbid obesity ([place])   5. PAF (paroxysmal atrial fibrillation) ([place])   6. Essential hypertension   7. Prediabetes   8. Left bundle branch block   9. On amiodarone therapy    PLAN:    In order of problems listed above:  1. 4-Pillar therapy for heart failure.  Needs to have basic metabolic panel done every 4 months to make sure potassium and kidney function remained stable.  He is also comanaged by the heart failure team.  I will decrease my involvement to once per year. 2. Continue in device clinic. 3. Encouraged compliance with CPAP. 4. Encouraged 150 minutes or more physical activity each week. 5. Continue Eliquis twice daily.  He does have a tooth implant coming up.  I instructed him on pausing Eliquis for 2 days prior to the procedure and to resume on the morning following the procedure. 6. Decreased  sodium diet discussed. 7. Decrease carbohydrates in diet 8. Not discussed 9. No longer on amiodarone.  Overall education and awareness concerning primary/secondary risk prevention was discussed in detail: LDL less than 70, hemoglobin A1c less than 7, blood pressure target less than 130/80 mmHg, >150 minutes of moderate aerobic activity per week, avoidance of smoking, weight control (via diet and exercise), and continued surveillance/management of/for obstructive sleep apnea.    Medication Adjustments/Labs  and Tests Ordered: Current medicines are reviewed at length with the patient today.  Concerns regarding medicines are outlined above.  No orders of the defined types were placed in this encounter.  No orders of  the defined types were placed in this encounter.   Patient Instructions  Medication Instructions:  Your physician recommends that you continue on your current medications as directed. Please refer to the Current Medication list given to you today.  *If you need a refill on your cardiac medications before your next appointment, please call your pharmacy*   Lab Work: Please make sure you are getting a CBC and a BMET every 6 months due to you being on Eliquis,  This can be done at our office or with your Primary Care.  If done somewhere else, please have them send [place] a copy.  Fax number is [phone].  If you have labs (blood work) drawn today and your tests are completely normal, you will receive your results only by: [name] MyChart Message (if you have MyChart) OR . A paper copy in the mail If you have any lab test that is abnormal or we need to change your treatment, we will call you to review the results.   Testing/Procedures: None   Follow-Up: At [facility], you and your health needs are our priority.  As part of our continuing mission to provide you with exceptional heart care, we have created designated Provider Care Teams.  These Care Teams include your primary Cardiologist (physician) and Advanced Practice Providers (APPs -  Physician Assistants and Nurse Practitioners) who all work together to provide you with the care you need, when you need it.  We recommend signing up for the patient portal called "MyChart".  Sign up information is provided on this After Visit Summary.  MyChart is used to connect with patients for Virtual Visits (Telemedicine).  Patients are able to view lab/test results, encounter notes, upcoming appointments, etc.  Non-urgent messages can be sent to  your provider as well.   To learn more about what you can do with MyChart, go to [email or web address].    Your next appointment:   1 year(s)  The format for your next appointment:   In Person  Provider:   You may see [name], MD or one of the following Advanced Practice Providers on your designated Care Team:    [name], NP    Other Instructions      Signed, [name], MD  03/16/2021 9:21 AM    [place]

## 2021-03-16 ENCOUNTER — Other Ambulatory Visit: Payer: Self-pay

## 2021-03-16 ENCOUNTER — Ambulatory Visit: Payer: 59 | Admitting: Interventional Cardiology

## 2021-03-16 ENCOUNTER — Encounter: Payer: Self-pay | Admitting: Interventional Cardiology

## 2021-03-16 ENCOUNTER — Ambulatory Visit (INDEPENDENT_AMBULATORY_CARE_PROVIDER_SITE_OTHER): Payer: 59

## 2021-03-16 VITALS — BP 148/88 | HR 88 | Ht 71.0 in | Wt 327.8 lb

## 2021-03-16 DIAGNOSIS — I5042 Chronic combined systolic (congestive) and diastolic (congestive) heart failure: Secondary | ICD-10-CM | POA: Diagnosis not present

## 2021-03-16 DIAGNOSIS — I10 Essential (primary) hypertension: Secondary | ICD-10-CM

## 2021-03-16 DIAGNOSIS — Z950 Presence of cardiac pacemaker: Secondary | ICD-10-CM

## 2021-03-16 DIAGNOSIS — G4733 Obstructive sleep apnea (adult) (pediatric): Secondary | ICD-10-CM | POA: Diagnosis not present

## 2021-03-16 DIAGNOSIS — I480 Paroxysmal atrial fibrillation: Secondary | ICD-10-CM

## 2021-03-16 DIAGNOSIS — Z79899 Other long term (current) drug therapy: Secondary | ICD-10-CM

## 2021-03-16 DIAGNOSIS — R7303 Prediabetes: Secondary | ICD-10-CM

## 2021-03-16 DIAGNOSIS — I447 Left bundle-branch block, unspecified: Secondary | ICD-10-CM

## 2021-03-16 NOTE — Progress Notes (Signed)
EPIC Encounter for ICM Monitoring  Patient Name: Nicholas Foley is a 70 y.o. male Date: 03/16/2021 Primary Care Physican: [name], MD Primary Cardiologist:Smith/Bensimhon Electrophysiologist:Allred Bi-V Pacing:93.6% 4/25/2022OfficeWeight: 3/27lbs (does not weigh at home)  Clinical Status (05-Mar-2021 to 15-Mar-2021) Time in AT/AF    <0.1 hr/day (<0.1%)   Transmission reviewed.   Optivolthoracic impedancestarting downward trend suggesting possible fluid accumulation starting 03/14/2021.  Prescribed:  Furosemide40 mg take2tablets(80 mgtotal)twice a day.  Potassium 20 mEq take1tablet daily  Labs: 01/07/2021 Creatinine 1.20, BUN 17, Potassium 3.7, Sodium 139, GFR >60 08/26/2020 Creatinine 1.47, BUN 22, Potassium 4.2, Sodium 138, GFR 49-56 03/13/2020 Creatinine1.08, BUN 18, Potassium 4.0, Sodium 138, GFR >60 01/15/2020 Creatinine 1.40, BUN 19, Potassium 4.2, Sodium 139, GFR 52-60 12/27/2019 Creatinine 1.21, BUN 16, Potassium 3.8, Sodium 138, GFR >60, BNP 137.8 A complete set of results can be found in Results Review.  Recommendations:No changes.  Follow-up plan: ICM clinic phone appointment on5/2/2022to recheck fluid levels.91 day device clinic remote transmission6/11/2020.  EP/Cardiology Office Visits: 03/16/2021 with Dr [name].Recall for2/23/2022 with Dr [name].Recall 07/06/2021 with Dr Haroldine Laws  Copy of ICM check sent to Dr.Allred and Dr [name] as Juluis Rainier for 03/16/2021 OV.  3 month ICM trend: 03/16/2021.    1 Year ICM trend:       Rosalene Billings, RN 03/16/2021 8:26 AM

## 2021-03-16 NOTE — Patient Instructions (Signed)
Medication Instructions:  Your physician recommends that you continue on your current medications as directed. Please refer to the Current Medication list given to you today.  *If you need a refill on your cardiac medications before your next appointment, please call your pharmacy*   Lab Work: Please make sure you are getting a CBC and a BMET every 6 months due to you being on Eliquis,  This can be done at our office or with your Primary Care.  If done somewhere else, please have them send [place] a copy.  Fax number is [phone].  If you have labs (blood work) drawn today and your tests are completely normal, you will receive your results only by: [name] MyChart Message (if you have MyChart) OR . A paper copy in the mail If you have any lab test that is abnormal or we need to change your treatment, we will call you to review the results.   Testing/Procedures: None   Follow-Up: At [facility], you and your health needs are our priority.  As part of our continuing mission to provide you with exceptional heart care, we have created designated Provider Care Teams.  These Care Teams include your primary Cardiologist (physician) and Advanced Practice Providers (APPs -  Physician Assistants and Nurse Practitioners) who all work together to provide you with the care you need, when you need it.  We recommend signing up for the patient portal called "MyChart".  Sign up information is provided on this After Visit Summary.  MyChart is used to connect with patients for Virtual Visits (Telemedicine).  Patients are able to view lab/test results, encounter notes, upcoming appointments, etc.  Non-urgent messages can be sent to your provider as well.   To learn more about what you can do with MyChart, go to [email or web address].    Your next appointment:   1 year(s)  The format for your next appointment:   In Person  Provider:   You may see [name], MD or one of the following Advanced  Practice Providers on your designated Care Team:    [name], NP    Other Instructions

## 2021-03-23 ENCOUNTER — Ambulatory Visit (INDEPENDENT_AMBULATORY_CARE_PROVIDER_SITE_OTHER): Payer: 59

## 2021-03-23 DIAGNOSIS — I5042 Chronic combined systolic (congestive) and diastolic (congestive) heart failure: Secondary | ICD-10-CM

## 2021-03-23 DIAGNOSIS — Z950 Presence of cardiac pacemaker: Secondary | ICD-10-CM

## 2021-03-24 NOTE — Progress Notes (Signed)
EPIC Encounter for ICM Monitoring  Patient Name: Nicholas Foley is a 70 y.o. male Date: 03/24/2021 Primary Care Physican: [name], MD Primary Cardiologist:Smith/Bensimhon Electrophysiologist:Allred Bi-V Pacing:92.1% 4/25/2022OfficeWeight: 327lbs (does not weigh at home)  Time in AT/AF               <0.1 hr/day (<0.1%)   Spoke with patient and reports feeling well at this time.  Denies fluid symptoms.    Optivolthoracic impedancesuggesting fluid levels returned to normal.  Prescribed:  Furosemide40 mg take2tablets(80 mgtotal)twice a day.  Potassium 20 mEq take1tablet daily  Labs: 01/07/2021 Creatinine 1.20, BUN 17, Potassium 3.7, Sodium 139, GFR >60 08/26/2020 Creatinine 1.47, BUN 22, Potassium 4.2, Sodium 138, GFR 49-56 03/13/2020 Creatinine1.08, BUN 18, Potassium 4.0, Sodium 138, GFR >60 01/15/2020 Creatinine 1.40, BUN 19, Potassium 4.2, Sodium 139, GFR 52-60 12/27/2019 Creatinine 1.21, BUN 16, Potassium 3.8, Sodium 138, GFR >60, BNP 137.8 A complete set of results can be found in Results Review.  Recommendations:No changes and encouraged to call if experiencing any fluid symptoms.  Follow-up plan: ICM clinic phone appointment on6/13/2022.91 day device clinic remote transmission6/11/2020.  EP/Cardiology Office Visits: Recall for2/23/2022 with Dr [name].Recall 07/06/2021 with Dr Haroldine Laws  Copy of ICM check sent to Dr.Allred  3 month ICM trend: 03/22/2021.    1 Year ICM trend:       Rosalene Billings, RN 03/24/2021 9:36 AM

## 2021-03-25 ENCOUNTER — Other Ambulatory Visit (HOSPITAL_COMMUNITY): Payer: Self-pay | Admitting: Internal Medicine

## 2021-04-08 ENCOUNTER — Telehealth: Payer: Self-pay | Admitting: Pulmonary Disease

## 2021-04-08 NOTE — Telephone Encounter (Signed)
Nicholas Foley.  This one is a LCS.

## 2021-04-08 NOTE — Telephone Encounter (Signed)
I tried to get his CT approved on line on 04/01/21. I had to fax records trying to get the CT approved. I called [employer] on Monday 04/06/21 and they had received the records and the Case # [id number] was in MD review.  I just got off the phone with [facility] and the [place] now has been approved Auth # [id number] valid 04/06/2021 to 05/21/2021. I have called the patient and left him this information along with the Auth #

## 2021-04-22 ENCOUNTER — Ambulatory Visit (INDEPENDENT_AMBULATORY_CARE_PROVIDER_SITE_OTHER): Payer: 59

## 2021-04-22 ENCOUNTER — Ambulatory Visit
Admission: RE | Admit: 2021-04-22 | Discharge: 2021-04-22 | Disposition: A | Discharge status: Home / Self Care | Payer: 59 | Source: Ambulatory Visit | Attending: Acute Care | Admitting: Acute Care

## 2021-04-22 ENCOUNTER — Other Ambulatory Visit: Payer: Self-pay

## 2021-04-22 DIAGNOSIS — I428 Other cardiomyopathies: Secondary | ICD-10-CM

## 2021-04-22 DIAGNOSIS — Z87891 Personal history of nicotine dependence: Secondary | ICD-10-CM

## 2021-04-23 LAB — CUP PACEART REMOTE DEVICE CHECK
Battery Remaining Longevity: 102 mo
Battery Voltage: 3 V
Brady Statistic AP VP Percent: 0.43 %
Brady Statistic AP VS Percent: 0.03 %
Brady Statistic AS VP Percent: 92.55 %
Brady Statistic AS VS Percent: 7 %
Brady Statistic RA Percent Paced: 0.47 %
Brady Statistic RV Percent Paced: 16.71 %
Date Time Interrogation Session: 20220531232602
Implantable Lead Implant Date: 20200123
Implantable Lead Implant Date: 20200123
Implantable Lead Implant Date: 20200123
Implantable Lead Location: 753858
Implantable Lead Location: 753859
Implantable Lead Location: 753860
Implantable Lead Model: 4598
Implantable Lead Model: 5076
Implantable Lead Model: 5076
Implantable Pulse Generator Implant Date: 20200123
Lead Channel Impedance Value: 1045 Ohm
Lead Channel Impedance Value: 1197 Ohm
Lead Channel Impedance Value: 1216 Ohm
Lead Channel Impedance Value: 342 Ohm
Lead Channel Impedance Value: 456 Ohm
Lead Channel Impedance Value: 475 Ohm
Lead Channel Impedance Value: 475 Ohm
Lead Channel Impedance Value: 551 Ohm
Lead Channel Impedance Value: 627 Ohm
Lead Channel Impedance Value: 646 Ohm
Lead Channel Impedance Value: 741 Ohm
Lead Channel Impedance Value: 836 Ohm
Lead Channel Impedance Value: 931 Ohm
Lead Channel Impedance Value: 931 Ohm
Lead Channel Pacing Threshold Amplitude: 0.5 V
Lead Channel Pacing Threshold Amplitude: 0.875 V
Lead Channel Pacing Threshold Amplitude: 1.875 V
Lead Channel Pacing Threshold Pulse Width: 0.4 ms
Lead Channel Pacing Threshold Pulse Width: 0.4 ms
Lead Channel Pacing Threshold Pulse Width: 0.5 ms
Lead Channel Sensing Intrinsic Amplitude: 0.625 mV
Lead Channel Sensing Intrinsic Amplitude: 0.625 mV
Lead Channel Sensing Intrinsic Amplitude: 11.375 mV
Lead Channel Sensing Intrinsic Amplitude: 11.375 mV
Lead Channel Setting Pacing Amplitude: 1.75 V
Lead Channel Setting Pacing Amplitude: 2.5 V
Lead Channel Setting Pacing Amplitude: 3 V
Lead Channel Setting Pacing Pulse Width: 0.4 ms
Lead Channel Setting Pacing Pulse Width: 0.5 ms
Lead Channel Setting Sensing Sensitivity: 2 mV

## 2021-04-24 ENCOUNTER — Telehealth: Payer: Self-pay | Admitting: Pulmonary Disease

## 2021-04-24 NOTE — Telephone Encounter (Signed)
Call report on lung ca screening CT done 04/22/21   Impression:  IMPRESSION: 1. Lung-RADS 4A, suspicious. New peripheral left lower lobe solid pulmonary nodule measuring 6.9 mm in volume derived mean diameter. Follow up low-dose chest CT without contrast in 3 months (please use the following order, "CT CHEST LCS NODULE FOLLOW-UP W/O CM") is recommended. Alternatively, PET may be considered when there is a solid component 50mm or larger. 2. Emphysema (ICD10-J43.9).  These results will be called to the ordering clinician or representative by the Radiologist Assistant, and communication documented in the PACS or [employer].   Electronically Signed   By: Ilona Sorrel M.D.   On: 04/23/2021 16:26  Forwarding to Judson Roch marked urgent

## 2021-04-30 NOTE — Telephone Encounter (Signed)
See result note. Judson Roch has spoken to the pt. Will close encounter.

## 2021-04-30 NOTE — Progress Notes (Signed)
I haver called the patient with the results of his low dose CT.  Lung RADS 4 A : suspicious findings, either short term follow up in 3 months or alternatively  PET Scan evaluation may be considered when there is a solid component of  8 mm or larger.  I told him we will order a 3 month follow up Low Dose to re-evaluate for stability, resolution or growth. He has had a recurrence of his [place] , so he has not been feeling well. He understands he will get a call to schedule the scan closer to when it is due. He verbalized understanding of the above and had no further questions at completion of the call.  Denise, please place order for 3 month follow up. Thanks so much.

## 2021-05-01 ENCOUNTER — Other Ambulatory Visit: Payer: Self-pay | Admitting: *Deleted

## 2021-05-01 DIAGNOSIS — Z87891 Personal history of nicotine dependence: Secondary | ICD-10-CM

## 2021-05-01 DIAGNOSIS — I5042 Chronic combined systolic (congestive) and diastolic (congestive) heart failure: Secondary | ICD-10-CM

## 2021-05-04 ENCOUNTER — Ambulatory Visit (INDEPENDENT_AMBULATORY_CARE_PROVIDER_SITE_OTHER): Payer: 59

## 2021-05-04 ENCOUNTER — Other Ambulatory Visit: Payer: Self-pay

## 2021-05-04 ENCOUNTER — Other Ambulatory Visit: Payer: 59

## 2021-05-04 DIAGNOSIS — I5042 Chronic combined systolic (congestive) and diastolic (congestive) heart failure: Secondary | ICD-10-CM

## 2021-05-04 DIAGNOSIS — Z950 Presence of cardiac pacemaker: Secondary | ICD-10-CM

## 2021-05-04 LAB — BASIC METABOLIC PANEL
BUN/Creatinine Ratio: 16 (ref 10–24)
BUN: 20 mg/dL (ref 8–27)
CO2: 19 mmol/L — ABNORMAL LOW (ref 20–29)
Calcium: 8.9 mg/dL (ref 8.6–10.2)
Chloride: 100 mmol/L (ref 96–106)
Creatinine, Ser: 1.25 mg/dL (ref 0.76–1.27)
Glucose: 92 mg/dL (ref 65–99)
Potassium: 4.4 mmol/L (ref 3.5–5.2)
Sodium: 136 mmol/L (ref 134–144)
eGFR: 63 mL/min/{1.73_m2} (ref >59)

## 2021-05-06 NOTE — Progress Notes (Signed)
EPIC Encounter for ICM Monitoring  Patient Name: Nicholas Foley is a 70 y.o. male Date: 05/06/2021 Primary Care Physican: [name], MD Primary Cardiologist: Smith/Bensimhon Electrophysiologist: Allred Bi-V Pacing:   91%       03/16/2021 Office Weight: 327 lbs (does not weigh at home)   Monitored VT-NS 4 Fast A&V 0 AT/AF 2 Time in AT/AF 1.0 hr/day (4.3%)                                                            Spoke with patient and reports feeling better after having a set back last week with long haul covid symptoms.  He back to exercising again and feeling better this week.      Optivol thoracic impedance suggesting normal fluid levels.   Prescribed: Furosemide 40 mg take 2 tablets (80 mg total) twice a day.  Potassium 20 mEq take 1 tablet daily   Labs: 05/04/2021 Creatinine 1.25, BUN 20, Potassium 4.4, Sodium 136, GFR 63 01/07/2021 Creatinine 1.20, BUN 17, Potassium 3.7, Sodium 139, GFR >60 A complete set of results can be found in Results Review.   Recommendations: No changes and encouraged to call if experiencing any fluid symptoms.   Follow-up plan: ICM clinic phone appointment on 06/08/2021. 91 day device clinic remote transmission 07/22/2021.     EP/Cardiology Office Visits:    Recall for 01/14/2021 with Dr [name].  Recall 07/06/2021 with Dr Haroldine Laws   Copy of ICM check sent to Dr. [name]  3 month ICM trend: 05/03/2021.    1 Year ICM trend:       Rosalene Billings, RN 05/06/2021 12:53 PM

## 2021-05-15 NOTE — Progress Notes (Signed)
Remote pacemaker transmission.   

## 2021-05-20 IMAGING — DX DG CHEST 2V
2 series · 2 of 2 positions shown · non-contrast
Comparison: Chest x-ray 11/17/2019.

CLINICAL DATA: 68-year-old male with history of ZPK8W-0R infection
in October 2019. Shortness of breath.

EXAM:
CHEST - 2 VIEW

[dg chest 2 view (1 of 2)]
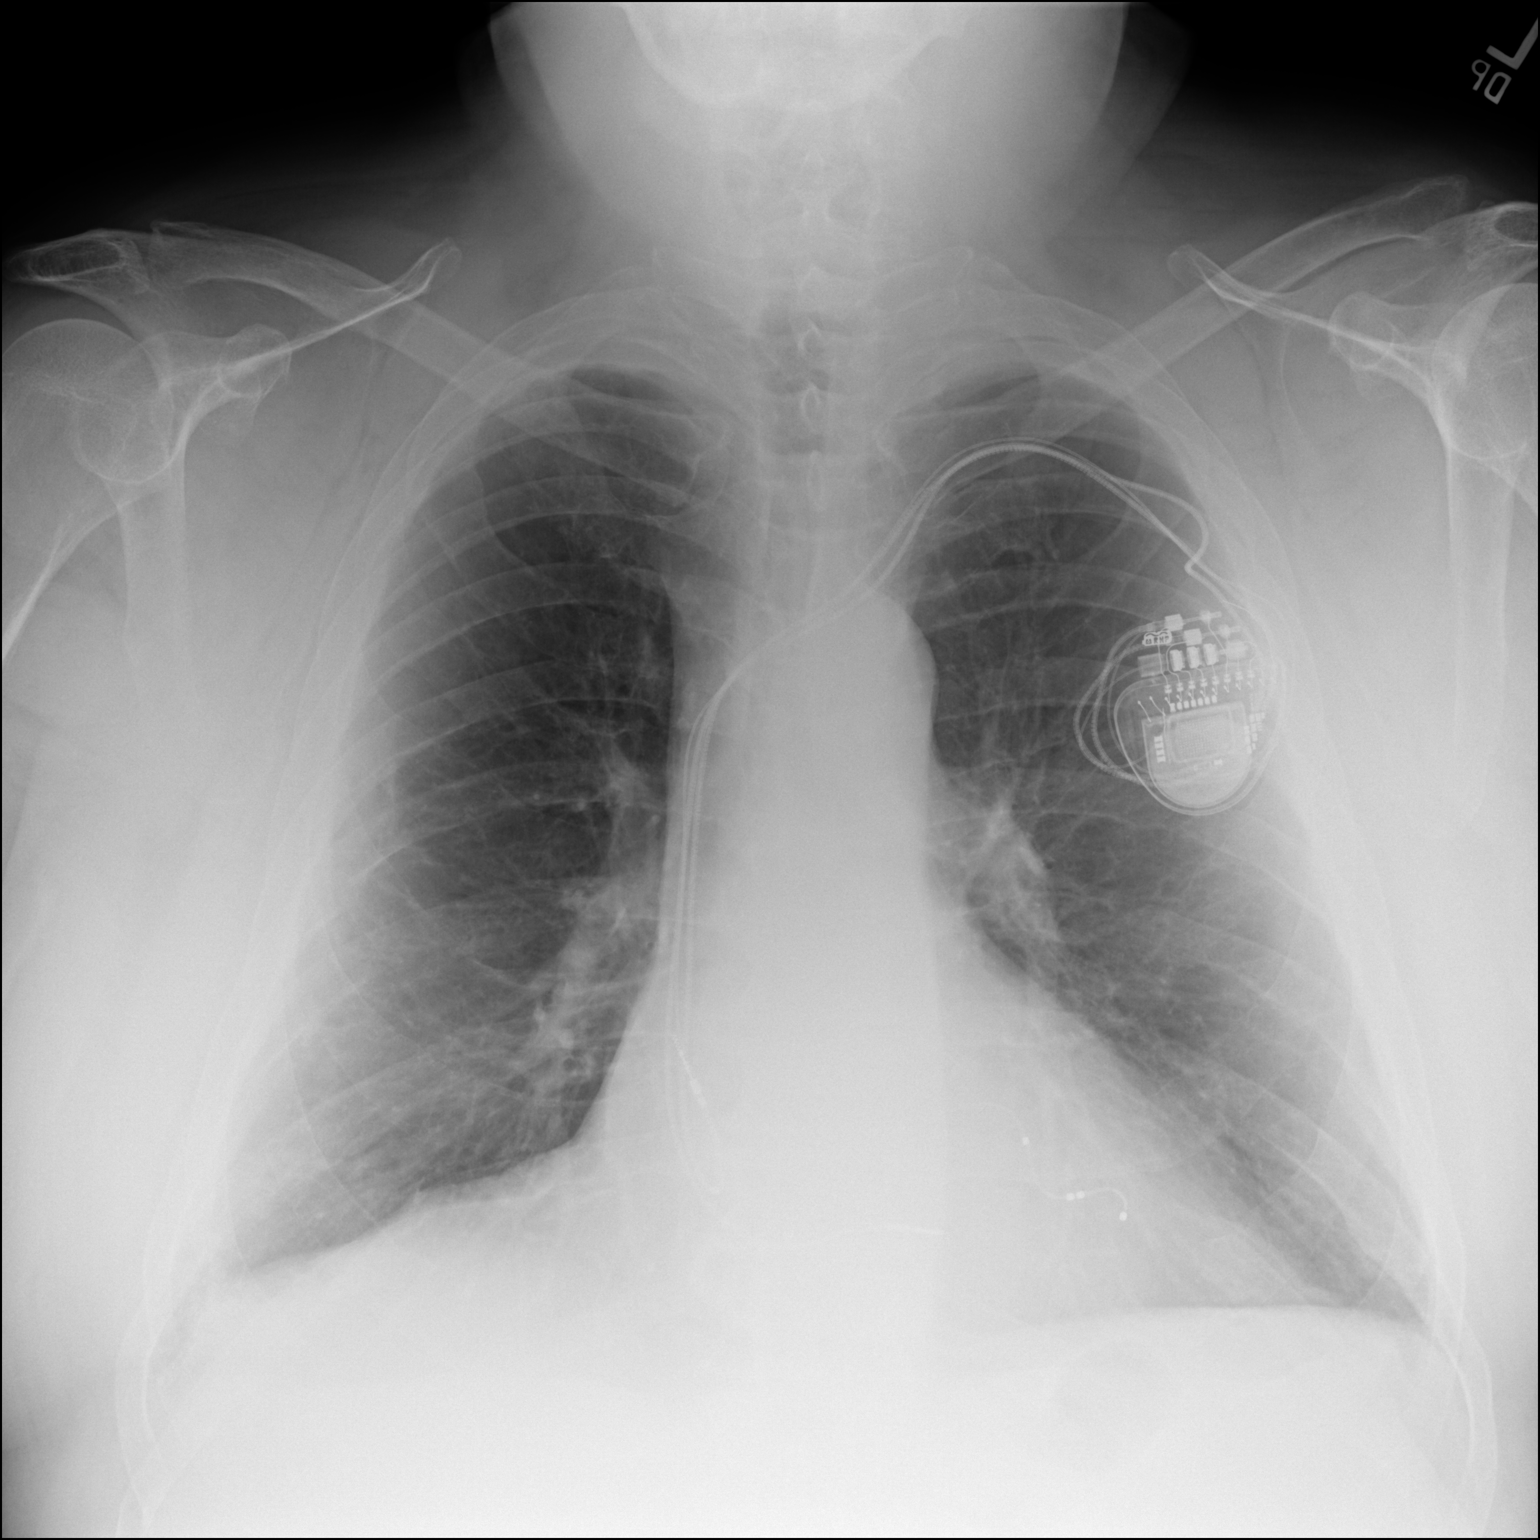

[dg chest 2 view (2 of 2)]
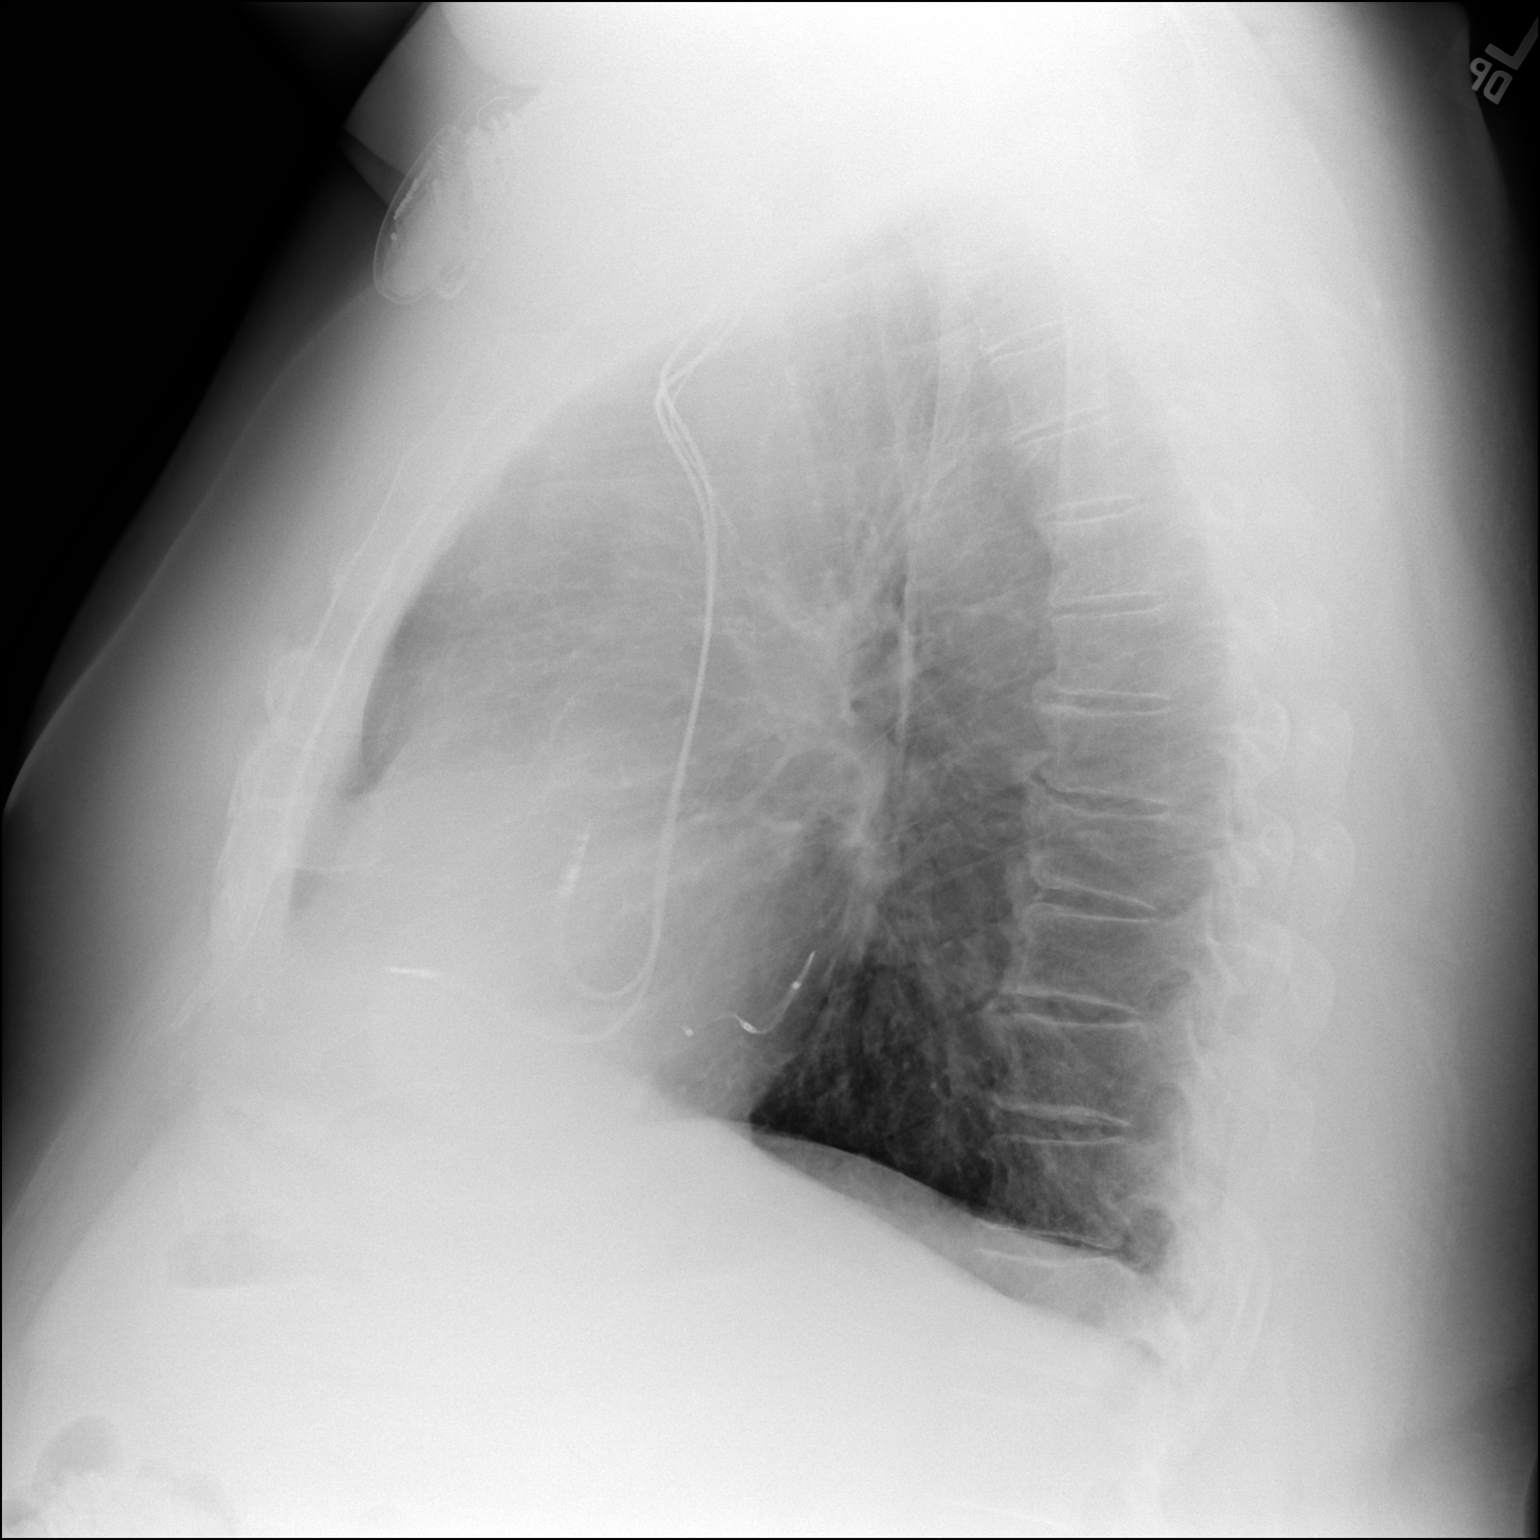

[2 of 2 positions shown; findings below may reference images not displayed]

FINDINGS: Patchy ill-defined opacities are noted throughout the periphery of
the mid to lower lungs bilaterally where there is also some mild
interstitial prominence. Overall, aeration appears slightly worsened
than the prior study. No pleural effusions. No evidence of pulmonary
edema. Heart size is normal. Upper mediastinal contours are within
normal limits. Aortic atherosclerosis. Left-sided biventricular
pacemaker device in place with lead tips projecting over the
expected location of the right atrium, right ventricle and overlying
the lateral wall the left ventricle via the coronary sinus and
coronary veins.
IMPRESSION: 1. Subtle imaging findings of bilateral multilobar pneumonia most
evident throughout the periphery of the mid to lower lungs
bilaterally, compatible with reported ZPK8W-0R infection.
2. Aortic atherosclerosis.

## 2021-06-08 ENCOUNTER — Ambulatory Visit (INDEPENDENT_AMBULATORY_CARE_PROVIDER_SITE_OTHER): Payer: 59

## 2021-06-08 DIAGNOSIS — I5042 Chronic combined systolic (congestive) and diastolic (congestive) heart failure: Secondary | ICD-10-CM | POA: Diagnosis not present

## 2021-06-08 DIAGNOSIS — Z950 Presence of cardiac pacemaker: Secondary | ICD-10-CM | POA: Diagnosis not present

## 2021-06-12 NOTE — Progress Notes (Signed)
EPIC Encounter for ICM Monitoring  Patient Name: Nicholas Foley is a 70 y.o. male Date: 06/12/2021 Primary Care Physican: [name], MD Primary Cardiologist: Smith/Bensimhon Electrophysiologist: Allred Bi-V Pacing:   91%       03/16/2021 Office Weight: 327 lbs (does not weigh at home)   Monitored VT-NS   3 Time in AT/AF  <0.1 hr/day (<0.1%)                                                            Spoke with patient and reports he is feeling better than he has in a long time.  He is on a workout program and diet.  He does not weight at home but belt buckle is down 4 holes.  He is feeling very good.   Optivol thoracic impedance suggesting normal fluid levels.   Prescribed: Furosemide 40 mg take 2 tablets (80 mg total) twice a day.  Potassium 20 mEq take 1 tablet daily   Labs: 05/04/2021 Creatinine 1.25, BUN 20, Potassium 4.4, Sodium 136, GFR 63 01/07/2021 Creatinine 1.20, BUN 17, Potassium 3.7, Sodium 139, GFR >60 A complete set of results can be found in Results Review.   Recommendations: No changes and encouraged to call if experiencing any fluid symptoms.   Follow-up plan: ICM clinic phone appointment on 07/13/2021. 91 day device clinic remote transmission 07/22/2021.     EP/Cardiology Office Visits:    Recall for 01/14/2021 with Dr [name].  Recall 07/06/2021 with Dr Haroldine Laws   Copy of ICM check sent to Dr. [name].   3 month ICM trend: 06/07/2021.    1 Year ICM trend:       Rosalene Billings, RN 06/12/2021 3:14 PM

## 2021-06-17 ENCOUNTER — Ambulatory Visit: Payer: 59 | Admitting: Pulmonary Disease

## 2021-06-30 ENCOUNTER — Other Ambulatory Visit: Payer: Self-pay

## 2021-06-30 ENCOUNTER — Ambulatory Visit: Payer: 59 | Admitting: Primary Care

## 2021-06-30 ENCOUNTER — Encounter: Payer: Self-pay | Admitting: Primary Care

## 2021-06-30 DIAGNOSIS — R918 Other nonspecific abnormal finding of lung field: Secondary | ICD-10-CM | POA: Diagnosis not present

## 2021-06-30 DIAGNOSIS — Z87891 Personal history of nicotine dependence: Secondary | ICD-10-CM | POA: Diagnosis not present

## 2021-06-30 DIAGNOSIS — J430 Unilateral pulmonary emphysema [MacLeod's syndrome]: Secondary | ICD-10-CM | POA: Diagnosis not present

## 2021-06-30 DIAGNOSIS — G4733 Obstructive sleep apnea (adult) (pediatric): Secondary | ICD-10-CM

## 2021-06-30 MED ORDER — PREDNISONE 10 MG PO TABS: ORAL_TABLET | ORAL | 0 refills | Status: DC

## 2021-06-30 MED ORDER — AMOXICILLIN-POT CLAVULANATE 875-125 MG PO TABS: 1.0000 | ORAL_TABLET | Freq: Two times a day (BID) | ORAL | 0 refills | Status: DC

## 2021-06-30 NOTE — Assessment & Plan Note (Signed)
-   Severe obstructive sleep apnea, AHI 74/hr - Maintained on CPAP at 8 cm H2O

## 2021-06-30 NOTE — Assessment & Plan Note (Signed)
-   Former smoker, quit in 2013 (63 pack year hx). Patient was referred to lung cancer screening program back in June 2022, low-dose CT chest on 04/22/2021 showed lung RADS 4A, suspicious.  New peripheral left lower lobe solid pulmonary nodule measuring 6.9 mm.  Plan to repeat low dose CT in September 2022. If pulmonary nodule has increased in size will need to discuss PET imaging vs tissue sampling.

## 2021-06-30 NOTE — Assessment & Plan Note (Addendum)
-   New dry cough x 2 months. Associated sinus congestion. Dyspnea has actually improved since last visit. Treating for acute sinobronchitis vs COPD exacerbation with Augmentin 875-'125mg'$  BID x 7 days and prednisone taper. Continue Advair 250-3mg 1 puff twice daily.

## 2021-06-30 NOTE — Patient Instructions (Addendum)
Nice seeing you today Nicholas Foley  We are treating you for acute sinobronchitis vs COPD exacerbation with abx and prednisone course. Pulmonary nodule is small <1cm, plan is to get repeat CT chest in 1 month and if there is any increase in size of nodule we will either get a PET scan or set you of for bronchoscopy (tissue sampling)  Recommendations: - Due for repeat CT chest in September 2022 - Continue Advair 250-50cmg 1 puff twice daily - Continue exercise regimen - Notify us if you develop hemoptysis, purulent mucus, chest pain/discomfort or increased shortness of breat   Rx: - Augmentin 1 tab twice a day x 7 days - Prednisone taper as directed   Follow-up: - 6-8 weeks with Dr. Vaughan Browner or sooner if needed

## 2021-06-30 NOTE — Assessment & Plan Note (Deleted)
-   Former smoker, quit in 2013 (63 pack year hx). Patient was referred to lung cancer screening program back in June 2022, low-dose CT chest on 04/22/2021 showed lung RADS 4A, suspicious.  New peripheral left lower lobe solid pulmonary nodule measuring 6.9 mm.  Plan to repeat low dose CT in September 2022. If pulmonary nodule has increased in size will need to discuss PET imaging vs tissue sampling.

## 2021-06-30 NOTE — Progress Notes (Signed)
$'@Patient'Q$  ID: Nicholas Foley, male    DOB: 26-Aug-1951, 70 y.o.   MRN: [id number]  Chief Complaint  Patient presents with   Follow-up    Patient reports has a dry cough x 2 months.     Referring provider: [name], MD  HPI: 70 year old male, former smoker quit in 2013 (64-pack-year history).  Past medical history significant for hypertension, chronic combined systolic and diastolic heart failure, nonischemic cardiomyopathy, paroxysmal A. Fib (chronic anticoagulation/amiodarone therapy), COPD, severe OSA, obesity.  06/30/2021- Interim hx  Patient presents today for 6 month follow.  During his last visit with Dr. Vaughan Browner he was given a sample of Trelegy 200.  Also referred to  lung cancer screening, dose CT chest on 04/22/2021 showed lung RADS 4A, suspicious.  New peripheral left lower lobe solid pulmonary nodule measuring 6.9 mm.  Follow-up recommended in 3 months September 2022.  Recently had recurrence of COVID long-haul syndrome.  He has had a new dry cough for 2 months. Associated mild sinusitis symptoms. His cough is not worse or better any particular time of day. He has chronic shortness of breath which is actually some better since his last visit with [place]. He is complaint with Advair 250-40mg 1 puff twice day. He prefer this inhaler to any other that he has tried. He does not require his rescue inhaler. Denies fever, chills, sweat, chest pain, chest tightness, wheezing, heartburn, hemoptysis or unplanned weight loss.   Imaging: 04/22/21 LDCT- Mild centrilobular or paraseptal emphysema. Mild diffuse bronchial wall thickening. No acute consolidative airspace diease or lung mases. No significant growth of previously visualized scattered small pulmonary nodules. New peripheral left lower lobe solid pulmonary nodule measuring    No Known Allergies  Immunization History  Administered Date(s) Administered   Fluad Quad(high Dose 65+) 09/25/2020   Influenza Split 11/03/2006, 11/28/2009,  10/29/2010, 08/30/2011, 08/24/2012, 08/24/2013, 07/29/2015   Influenza, High Dose Seasonal PF 08/28/2018, 09/14/2019   Influenza,inj,Quad PF,6+ Mos 07/15/2017   Influenza,inj,quad, With Preservative 09/13/2014   Moderna Sars-Covid-2 Vaccination 02/11/2020, 03/10/2020, 09/25/2020   Pneumococcal Conjugate-13 09/13/2014, 05/11/2019   Pneumococcal Polysaccharide-23 05/23/2009   Tdap 02/24/2007, 04/14/2018   Zoster, Live 03/08/2014    Past Medical History:  Diagnosis Date   Atrial fibrillation with rapid ventricular response ([facility] [place] 08/02/2013   CHF (congestive heart failure) (HCC)    Chronic systolic dysfunction of left ventricle 08/02/2013   BNP greater than 2000    COPD (chronic obstructive pulmonary disease) (HCC)    Moderate by PFTs with response to bronchodilators, May 2014   Erectile dysfunction    Hypertension    Insomnia 08/02/13   Left bundle branch block 08/02/2013   Morbid obesity ([facility] 08/02/2013   OSA (obstructive sleep apnea)    severe OSA with AHI 74/hr now on CPAP at 8cm H2O    Tobacco History: Social History   Tobacco Use  Smoking Status Former   Packs/day: 1.50   Years: 43.00   Pack years: 64.50   Types: Cigarettes, Cigars   Quit date: 08/02/2012   Years since quitting: 8.9  Smokeless Tobacco Never   Counseling given: Not Answered   Outpatient Medications Prior to Visit  Medication Sig Dispense Refill   acetaminophen (TYLENOL) 650 MG CR tablet Take 1,300 mg by mouth every 8 (eight) hours as needed for pain.      ADVAIR DISKUS 250-50 MCG/DOSE AEPB Inhale 1 puff into the lungs 2 (two) times daily.     apixaban (ELIQUIS) 5 MG TABS tablet Take 1 tablet (  5 mg total) by mouth 2 (two) times daily. 180 tablet 2   carvedilol (COREG) 12.5 MG tablet Take 1 tablet (12.5 mg total) by mouth 2 (two) times daily with a meal. 60 tablet 3   cholecalciferol (VITAMIN D3) 25 MCG (1000 UT) tablet Take 1,000 Units by mouth daily.     CIALIS 20 MG tablet Take 20 mg by mouth daily as  needed for erectile dysfunction.   0   cyclobenzaprine (FLEXERIL) 10 MG tablet Take 10 mg by mouth as needed.     empagliflozin (JARDIANCE) 10 MG TABS tablet Take 1 tablet (10 mg total) by mouth daily before breakfast. 90 tablet 3   fluticasone (FLONASE) 50 MCG/ACT nasal spray Place 1 spray into both nostrils 2 (two) times daily.   0   furosemide (LASIX) 40 MG tablet TAKE 2 TABLETS(80 MG) BY MOUTH TWICE DAILY 360 tablet 1   omeprazole (PRILOSEC OTC) 20 MG tablet Take 20 mg by mouth daily.     potassium chloride SA (KLOR-CON) 20 MEQ tablet Take 1 tablet (20 mEq total) by mouth daily. 90 tablet 3   sacubitril-valsartan (ENTRESTO) 97-103 MG Take 1 tablet by mouth 2 (two) times daily. 180 tablet 3   spironolactone (ALDACTONE) 25 MG tablet Take 1 tablet (25 mg total) by mouth daily. 90 tablet 3   vitamin B-12 (CYANOCOBALAMIN) 1000 MCG tablet Take 1,000 mcg by mouth daily.     zolpidem (AMBIEN CR) 12.5 MG CR tablet Take 1 tablet by mouth at bedtime as needed for sleep.      carvedilol (COREG) 12.5 MG tablet Take 1 tablet (12.5 mg total) by mouth 2 (two) times daily with a meal. (Patient not taking: Reported on 06/30/2021) 60 tablet 6   No facility-administered medications prior to visit.    Review of Systems  Review of Systems  Constitutional: Negative.   HENT:  Positive for congestion.   Respiratory:  Positive for cough and shortness of breath. Negative for chest tightness and wheezing.     Physical Exam  BP 130/80 (BP Location: Left Arm, Patient Position: Sitting, Cuff Size: Normal)   Pulse 82   Temp (!) 97.4 F (36.3 C) (Oral)   Ht '5\' 11"'$  (1.803 m)   Wt (!) 321 lb (145.6 kg)   SpO2 96%   BMI 44.77 kg/m  Physical Exam Constitutional:      General: He is not in acute distress.    Appearance: Normal appearance. He is obese. He is not ill-appearing.  HENT:     Head: Normocephalic and atraumatic.     Mouth/Throat:     Mouth: Mucous membranes are moist.     Pharynx: Oropharynx is  clear.  Cardiovascular:     Rate and Rhythm: Normal rate and regular rhythm.  Pulmonary:     Effort: Pulmonary effort is normal.     Breath sounds: Normal breath sounds. No wheezing, rhonchi or rales.  Skin:    General: Skin is warm and dry.  Neurological:     General: No focal deficit present.     Mental Status: He is alert and oriented to person, place, and time. Mental status is at baseline.  Psychiatric:        Mood and Affect: Mood normal.        Behavior: Behavior normal.        Thought Content: Thought content normal.        Judgment: Judgment normal.     Lab Results:  CBC    Component  Value Date/Time   WBC 7.1 01/07/2021 1101   RBC 4.16 (L) 01/07/2021 1101   HGB 13.0 01/07/2021 1101   HGB 14.2 08/27/2020 0820   HCT 39.1 01/07/2021 1101   HCT 41.1 08/27/2020 0820   PLT 233 01/07/2021 1101   PLT 293 08/27/2020 0820   MCV 94.0 01/07/2021 1101   MCV 91 08/27/2020 0820   MCH 31.3 01/07/2021 1101   MCHC 33.2 01/07/2021 1101   RDW 12.6 01/07/2021 1101   RDW 12.4 08/27/2020 0820   LYMPHSABS 1.0 11/17/2019 2007   MONOABS 0.8 11/17/2019 2007   EOSABS 0.1 11/17/2019 2007   BASOSABS 0.0 11/17/2019 2007    BMET    Component Value Date/Time   NA 136 05/04/2021 1124   K 4.4 05/04/2021 1124   CL 100 05/04/2021 1124   CO2 19 (L) 05/04/2021 1124   GLUCOSE 92 05/04/2021 1124   GLUCOSE 111 (H) 01/07/2021 1101   BUN 20 05/04/2021 1124   CREATININE 1.25 05/04/2021 1124   CALCIUM 8.9 05/04/2021 1124   GFRNONAA >60 01/07/2021 1101   GFRAA 56 (L) 08/27/2020 0820    BNP    Component Value Date/Time   BNP 103.7 (H) 01/07/2021 1101    ProBNP    Component Value Date/Time   PROBNP 1,939 (H) 12/20/2019 1125   PROBNP 2,865.0 (H) 08/02/2013 1221    Imaging: No results found.   Assessment & Plan:   COPD (chronic obstructive pulmonary disease) (HCC) - New dry cough x 2 months. Associated sinus congestion. Dyspnea has actually improved since last visit. Treating for  acute sinobronchitis vs COPD exacerbation with Augmentin 875-'125mg'$  BID x 7 days and prednisone taper. Continue Advair 250-74mg 1 puff twice daily.   OSA (obstructive sleep apnea) - Severe obstructive sleep apnea, AHI 74/hr - Maintained on CPAP at 8 cm H2O  Pulmonary nodules - Former smoker, quit in 2013 (63 pack year hx). Patient was referred to lung cancer screening program back in June 2022, low-dose CT chest on 04/22/2021 showed lung RADS 4A, suspicious.  New peripheral left lower lobe solid pulmonary nodule measuring 6.9 mm.  Plan to repeat low dose CT in September 2022. If pulmonary nodule has increased in size will need to discuss PET imaging vs tissue sampling.    EMartyn Ehrich NP 06/30/2021

## 2021-07-13 ENCOUNTER — Ambulatory Visit (INDEPENDENT_AMBULATORY_CARE_PROVIDER_SITE_OTHER): Payer: 59

## 2021-07-13 DIAGNOSIS — Z950 Presence of cardiac pacemaker: Secondary | ICD-10-CM

## 2021-07-13 DIAGNOSIS — I5042 Chronic combined systolic (congestive) and diastolic (congestive) heart failure: Secondary | ICD-10-CM

## 2021-07-14 ENCOUNTER — Telehealth: Payer: Self-pay

## 2021-07-14 NOTE — Progress Notes (Signed)
Spoke with patient and heart failure questions reviewed.  Pt received call from device clinic triage nurse Amy today regarding Afib.  He has been referred to Afib clinic and report sent to Dr [name] for review and recommendations if needed.    Pt started feeling poorly and fatigued on 8/20.  He has not worked for 2 days.  Discussed remote transmission suggesting possible fluid accumulation starting approximately same time possible Afib.  He normally adjusts Furosemide when he has fluid per previous discussions with Dr Haroldine Laws.  He will take 1 extra Furosemide tomorrow morning.  Explained Afib can also cause fluid accumulation.  Will recheck fluid levels on 07/20/2021.  Advised he should hear from [facility] for appointment tomorrow.

## 2021-07-14 NOTE — Telephone Encounter (Signed)
Received message from [facility] clinic regarding pt ICD transmission- Increased AF burden.    Transmission reviewed, pt is in AF since 8/18.    Spoke with patient.  He reports he has felt lousy the past couple of days, so much so that he left work early yesterday and stayed home today.  He doesn't provide any specific symptom except fatigue and overall not feeling well.    Pt confirmed compliance with meds as ordered.   Pt recently prescribed a prednisone taper which he finished over a week ago.    Pt agreeable with AF clinic consult.  Advised I would also forward to Dr. [name].

## 2021-07-14 NOTE — Progress Notes (Signed)
EPIC Encounter for ICM Monitoring  Patient Name: Nicholas Foley is a 70 y.o. male Date: 07/14/2021 Primary Care Physican: [name], MD Primary Cardiologist: Smith/Bensimhon Electrophysiologist: Allred Bi-V Pacing: 85.6% (decreased from 91% on 06/08/21 report)     03/16/2021 Office Weight: 327 lbs (does not weigh at home)   Clinical Status Since 07-Jun-2021 Monitored VT (>4 beats) 6 Fast A&V 3 AT/AF 2 Time in AT/AF 4.3 hr/day (17.8%) (06/08/21 report was  <0.1 hr/day (<0.1%))          Attempted call to patient and unable to reach.  Left detailed message per DPR regarding transmission. Transmission reviewed.                                          Optivol thoracic impedance suggesting possible fluid accumulation since 8/16.  Message sent to device clinic triage 8/23 to review report for Fast A&V and increase in AT/AF.   Prescribed: Furosemide 40 mg take 2 tablets (80 mg total) twice a day.  Potassium 20 mEq take 1 tablet daily   Labs: 05/04/2021 Creatinine 1.25, BUN 20, Potassium 4.4, Sodium 136, GFR 63 01/07/2021 Creatinine 1.20, BUN 17, Potassium 3.7, Sodium 139, GFR >60 A complete set of results can be found in Results Review.   Recommendations: Left voice mail with ICM number and encouraged to call if experiencing any fluid symptoms.   Follow-up plan: ICM clinic phone appointment on 07/20/2021 to recheck fluid levels.   91 day device clinic remote transmission 07/22/2021.     EP/Cardiology Office Visits:    Recall for 01/14/2021 with Dr [name].  Recall 07/06/2021 with Dr Haroldine Laws   Copy of ICM check sent to Dr. [name] and Dr Haroldine Laws for review.    3 month ICM trend: 07/12/2021.    1 Year ICM trend:       Rosalene Billings, RN 07/14/2021 8:57 AM

## 2021-07-14 NOTE — Telephone Encounter (Signed)
Remote ICM transmission received.  Attempted call to patient regarding ICM remote transmission and left detailed message per DPR to return call.   

## 2021-07-15 ENCOUNTER — Ambulatory Visit (HOSPITAL_COMMUNITY)
Admission: RE | Admit: 2021-07-15 | Discharge: 2021-07-15 | Disposition: A | Discharge status: Home / Self Care | Payer: 59 | Source: Ambulatory Visit | Attending: Nurse Practitioner | Admitting: Nurse Practitioner

## 2021-07-15 ENCOUNTER — Other Ambulatory Visit: Payer: Self-pay

## 2021-07-15 VITALS — BP 142/74 | HR 88 | Ht 71.0 in | Wt 319.4 lb

## 2021-07-15 DIAGNOSIS — Z8249 Family history of ischemic heart disease and other diseases of the circulatory system: Secondary | ICD-10-CM | POA: Diagnosis not present

## 2021-07-15 DIAGNOSIS — I5042 Chronic combined systolic (congestive) and diastolic (congestive) heart failure: Secondary | ICD-10-CM | POA: Diagnosis not present

## 2021-07-15 DIAGNOSIS — Z79899 Other long term (current) drug therapy: Secondary | ICD-10-CM | POA: Insufficient documentation

## 2021-07-15 DIAGNOSIS — Z87891 Personal history of nicotine dependence: Secondary | ICD-10-CM | POA: Diagnosis not present

## 2021-07-15 DIAGNOSIS — I4819 Other persistent atrial fibrillation: Secondary | ICD-10-CM | POA: Insufficient documentation

## 2021-07-15 DIAGNOSIS — D6869 Other thrombophilia: Secondary | ICD-10-CM

## 2021-07-15 DIAGNOSIS — I480 Paroxysmal atrial fibrillation: Secondary | ICD-10-CM | POA: Diagnosis not present

## 2021-07-15 DIAGNOSIS — Z7901 Long term (current) use of anticoagulants: Secondary | ICD-10-CM | POA: Insufficient documentation

## 2021-07-15 LAB — CBC
HCT: 44.4 % (ref 39.0–52.0)
Hemoglobin: 14.6 g/dL (ref 13.0–17.0)
MCH: 30.9 pg (ref 26.0–34.0)
MCHC: 32.9 g/dL (ref 30.0–36.0)
MCV: 93.9 fL (ref 80.0–100.0)
Platelets: 243 10*3/uL (ref 150–400)
RBC: 4.73 MIL/uL (ref 4.22–5.81)
RDW: 14.1 % (ref 11.5–15.5)
WBC: 7.8 10*3/uL (ref 4.0–10.5)
nRBC: 0 % (ref 0.0–0.2)

## 2021-07-15 LAB — BASIC METABOLIC PANEL
Anion gap: 9 (ref 5–15)
BUN: 17 mg/dL (ref 8–23)
CO2: 27 mmol/L (ref 22–32)
Calcium: 8.5 mg/dL — ABNORMAL LOW (ref 8.9–10.3)
Chloride: 102 mmol/L (ref 98–111)
Creatinine, Ser: 1.08 mg/dL (ref 0.61–1.24)
GFR, Estimated: >60 mL/min (ref >60)
Glucose, Bld: 104 mg/dL — ABNORMAL HIGH (ref 70–99)
Potassium: 3.7 mmol/L (ref 3.5–5.1)
Sodium: 138 mmol/L (ref 135–145)

## 2021-07-15 NOTE — Telephone Encounter (Signed)
Patient returned my call and is agreeable to appt today 07/15/21.

## 2021-07-15 NOTE — H&P (View-Only) (Signed)
Primary Care Physician: [name], MD Referring Physician: Device clinic EP: Dr. [name] Cardiologist: Dr. Faythe Casa AHF- Dr. [name] is a 70 y.o. male with a h/o  of NICM, Chronic CHF (systolic=> EF [id number], [id number]) ), COPD (prior smoker), OSA (w/CPAP), AFib, HTN, LBBB, CRT-P, morbid obesity, prior amiodarone therapy (DC 2018), and prior smoker. Suffered COVID-19 infection in December 2020.  He is in the afib clinic per the device clinic, the pt has been noted to be in afib since 8/18. He had recently been on an an antibiotic and  prednisone taper for  f/u long haul Covid  and a "spot on his lung." He feels he has retained fluid during this time. He has been instructed to take an extra lasix. Had previously been treated on Amiodarone, per Epic,  but pt does not remember this drug. EKG today shows  afib at 88 bpm. He feels very fatigued with afib. Recommendation from the long Covid clinic at [facility] was exercise and he has been able to regain a lot of his strength through regular exercsie program and  working with a Physiological scientist. HE was feeling really good until recent with return of afib. He is anxious to get back to exercise and get his energy back. No missed anticoagulation for a CHA2DS2VASc score of 4.   Today, he denies symptoms of palpitations, chest pain, shortness of breath, orthopnea, PND, lower extremity edema, dizziness, presyncope, syncope, or neurologic sequela. The patient is tolerating medications without difficulties and is otherwise without complaint today.   Past Medical History:  Diagnosis Date   Atrial fibrillation with rapid ventricular response ([place]) 08/02/2013   CHF (congestive heart failure) (HCC)    Chronic systolic dysfunction of left ventricle 08/02/2013   BNP greater than 2000    COPD (chronic obstructive pulmonary disease) (HCC)    Moderate by PFTs with response to bronchodilators, May 2014   Erectile dysfunction    Hypertension    Insomnia 08/02/13    Left bundle branch block 08/02/2013   Morbid obesity ([place]) 08/02/2013   OSA (obstructive sleep apnea)    severe OSA with AHI 74/hr now on CPAP at 8cm H2O   Past Surgical History:  Procedure Laterality Date   BIV PACEMAKER INSERTION CRT-P N/A 12/14/2018   Medtronic model [id number] Percepta Quad CRT-P MRI Surescan (serial Number [id number]) device implanted by Dr [name] for CHF and LBBB   CARDIOVERSION N/A 08/06/2013   Procedure: CARDIOVERSION;  Surgeon: Lelon Perla, MD;  Location: [facility] ENDOSCOPY;  Service: Cardiovascular;  Laterality: N/A;  spoke with [name]    CARDIOVERSION N/A 09/03/2013   Procedure: CARDIOVERSION;  Surgeon: [name], MD;  Location: [facility] ENDOSCOPY;  Service: Cardiovascular;  Laterality: N/A;   chronic systolic dysfu     NASAL SEPTUM SURGERY     RIGHT/LEFT HEART CATH AND CORONARY ANGIOGRAPHY N/A 12/04/2018   Procedure: RIGHT/LEFT HEART CATH AND CORONARY ANGIOGRAPHY;  Surgeon: Belva Crome, MD;  Location: [facility] CV LAB;  Service: Cardiovascular;  Laterality: N/A;   TEE WITHOUT CARDIOVERSION N/A 08/06/2013   Procedure: TRANSESOPHAGEAL ECHOCARDIOGRAM (TEE);  Surgeon: Lelon Perla, MD;  Location: [facility] ENDOSCOPY;  Service: Cardiovascular;  Laterality: N/A;   VASECTOMY      Current Outpatient Medications  Medication Sig Dispense Refill   acetaminophen (TYLENOL) 650 MG CR tablet Take 1,300 mg by mouth every 8 (eight) hours as needed for pain.      ADVAIR DISKUS 250-50 MCG/DOSE AEPB Inhale 1  puff into the lungs 2 (two) times daily.     apixaban (ELIQUIS) 5 MG TABS tablet Take 1 tablet (5 mg total) by mouth 2 (two) times daily. 180 tablet 2   carvedilol (COREG) 12.5 MG tablet Take 1 tablet (12.5 mg total) by mouth 2 (two) times daily with a meal. 60 tablet 3   cholecalciferol (VITAMIN D3) 25 MCG (1000 UT) tablet Take 1,000 Units by mouth daily.     CIALIS 20 MG tablet Take 20 mg by mouth daily as needed for erectile dysfunction.   0   cyclobenzaprine (FLEXERIL) 10  MG tablet Take 10 mg by mouth as needed.     empagliflozin (JARDIANCE) 10 MG TABS tablet Take 1 tablet (10 mg total) by mouth daily before breakfast. 90 tablet 3   fluticasone (FLONASE) 50 MCG/ACT nasal spray Place 1 spray into both nostrils 2 (two) times daily.   0   furosemide (LASIX) 40 MG tablet TAKE 2 TABLETS(80 MG) BY MOUTH TWICE DAILY 360 tablet 1   omeprazole (PRILOSEC OTC) 20 MG tablet Take 20 mg by mouth daily.     potassium chloride SA (KLOR-CON) 20 MEQ tablet Take 1 tablet (20 mEq total) by mouth daily. 90 tablet 3   sacubitril-valsartan (ENTRESTO) 97-103 MG Take 1 tablet by mouth 2 (two) times daily. 180 tablet 3   spironolactone (ALDACTONE) 25 MG tablet Take 1 tablet (25 mg total) by mouth daily. 90 tablet 3   vitamin B-12 (CYANOCOBALAMIN) 1000 MCG tablet Take 1,000 mcg by mouth daily.     zolpidem (AMBIEN CR) 12.5 MG CR tablet Take 1 tablet by mouth at bedtime as needed for sleep.      No current facility-administered medications for this encounter.    No Known Allergies  Social History   Socioeconomic History   Marital status: Married    Spouse name: Not on file   Number of children: Not on file   Years of education: Not on file   Highest education level: Not on file  Occupational History   Occupation: real estate    Comment: investment  Tobacco Use   Smoking status: Former    Packs/day: 1.50    Years: 43.00    Pack years: 64.50    Types: Cigarettes, Cigars    Quit date: 08/02/2012    Years since quitting: 8.9   Smokeless tobacco: Never  Vaping Use   Vaping Use: Never used  Substance and Sexual Activity   Alcohol use: Yes    Alcohol/week: 0.0 standard drinks    Comment: once a week; previously 3-4 times per week    Drug use: No   Sexual activity: Yes  Other Topics Concern   Not on file  Social History Narrative   Works as a Engineer, structural for apartments   Lives with wife.  They have one grown daughter   Highest level of education:  college    Social Determinants of Health   Financial Resource Strain: Not on file  Food Insecurity: Not on file  Transportation Needs: Not on file  Physical Activity: Not on file  Stress: Not on file  Social Connections: Not on file  Intimate Partner Violence: Not on file    Family History  Problem Relation Age of Onset   Atrial fibrillation Mother    COPD Father    Cerebral aneurysm Brother    Healthy Daughter     ROS- All systems are reviewed and negative except as per the HPI above  Physical  Exam: Vitals:   07/15/21 1318  Weight: (!) 144.9 kg  Height: '5\' 11"'$  (1.803 m)   Wt Readings from Last 3 Encounters:  07/15/21 (!) 144.9 kg  06/30/21 (!) 145.6 kg  03/16/21 (!) 148.7 kg    Labs: Lab Results  Component Value Date   NA 136 05/04/2021   K 4.4 05/04/2021   CL 100 05/04/2021   CO2 19 (L) 05/04/2021   GLUCOSE 92 05/04/2021   BUN 20 05/04/2021   CREATININE 1.25 05/04/2021   CALCIUM 8.9 05/04/2021   MG 2.1 01/15/2020   Lab Results  Component Value Date   INR 1.2 11/17/2019   Lab Results  Component Value Date   CHOL 184 08/03/2013   HDL 50 08/03/2013   LDLCALC 104 (H) 08/03/2013   TRIG 149 08/03/2013     GEN- The patient is well appearing, alert and oriented x 3 today.   Head- normocephalic, atraumatic Eyes-  Sclera clear, conjunctiva pink Ears- hearing intact Oropharynx- clear Neck- supple, no JVP Lymph- no cervical lymphadenopathy Lungs- Clear to ausculation bilaterally, normal work of breathing Heart- irregular rate and rhythm, no murmurs, rubs or gallops, PMI not laterally displaced GI- soft, NT, ND, + BS Extremities- no clubbing, cyanosis, or edema MS- no significant deformity or atrophy Skin- no rash or lesion Psych- euthymic mood, full affect Neuro- strength and sensation are intact  EKG- afib at 88 bpm, qrs int 130 ms, qtc 486 ms     Assessment and Plan:  1. Persistent afib  Return to persistent  afib on 8/18 Probable trigger was recent   prednisone taper  He is rate controlled but symptomatic I will plan on earliest cardioversion  He states that the cardioversion he had many years ago, with his thick chest wall, required more energy with  a stronger defibrillator and multiple shocks First available is next Tuesday. Pt is Dr. Clayborne Dana but he is off that day   I spoke to Dr. Aundra Dubin and he is willing to perform the cardioversion. He has used double defibrillators in the past,  if needed   Cbc/bmet today   2. CHA2DS2VASc  score of 4 He states no missed doses of eliquis 5 mg bid for at least  3 weeks   3. Chronic combined systolic and diastolic heart failure  He retained fluid since prednisone taper Continue current meds for HF He took an extra lasix yesterday and feels better today  Weight appears stable   F/u here in one week   Butch Penny C. Ronit Marczak, [facility] [address] [place], [place] [address] [phone]

## 2021-07-15 NOTE — Patient Instructions (Signed)
Cardioversion scheduled for Tuesday, August 30th  - Arrive at the [facility] and go to admitting at 830AM  - Do not eat or drink anything after midnight the night prior to your procedure.  - Take all your morning medication (except diabetic medications) with a sip of water prior to arrival.  - You will not be able to drive home after your procedure.  - Do NOT miss any doses of your blood thinner - if you should miss a dose please notify our office immediately.  - If you feel as if you go back into normal rhythm prior to scheduled cardioversion, please notify our office immediately. If your procedure is canceled in the cardioversion suite you will be charged a cancellation fee.  Patients will be asked to: to mask in public and hand hygiene (no longer quarantine) in the 3 days prior to surgery, to report if any COVID-19-like illness or household contacts to COVID-19 to determine need for testing

## 2021-07-15 NOTE — Progress Notes (Addendum)
Primary Care Physician: [name], MD Referring Physician: Device clinic EP: Dr. [name] Cardiologist: Dr. Faythe Casa AHF- Dr. [name] is a 70 y.o. male with a h/o  of NICM, Chronic CHF (systolic=> EF [id number], [id number]) ), COPD (prior smoker), OSA (w/CPAP), AFib, HTN, LBBB, CRT-P, morbid obesity, prior amiodarone therapy (DC 2018), and prior smoker. Suffered COVID-19 infection in December 2020.  He is in the afib clinic per the device clinic, the pt has been noted to be in afib since 8/18. He had recently been on an an antibiotic and  prednisone taper for  f/u long haul Covid  and a "spot on his lung." He feels he has retained fluid during this time. He has been instructed to take an extra lasix. Had previously been treated on Amiodarone, per Epic,  but pt does not remember this drug. EKG today shows  afib at 88 bpm. He feels very fatigued with afib. Recommendation from the long Covid clinic at [facility] was exercise and he has been able to regain a lot of his strength through regular exercsie program and  working with a Physiological scientist. HE was feeling really good until recent with return of afib. He is anxious to get back to exercise and get his energy back. No missed anticoagulation for a CHA2DS2VASc score of 4.   Today, he denies symptoms of palpitations, chest pain, shortness of breath, orthopnea, PND, lower extremity edema, dizziness, presyncope, syncope, or neurologic sequela. The patient is tolerating medications without difficulties and is otherwise without complaint today.   Past Medical History:  Diagnosis Date   Atrial fibrillation with rapid ventricular response ([place]) 08/02/2013   CHF (congestive heart failure) (HCC)    Chronic systolic dysfunction of left ventricle 08/02/2013   BNP greater than 2000    COPD (chronic obstructive pulmonary disease) (HCC)    Moderate by PFTs with response to bronchodilators, May 2014   Erectile dysfunction    Hypertension    Insomnia 08/02/13    Left bundle branch block 08/02/2013   Morbid obesity ([place]) 08/02/2013   OSA (obstructive sleep apnea)    severe OSA with AHI 74/hr now on CPAP at 8cm H2O   Past Surgical History:  Procedure Laterality Date   BIV PACEMAKER INSERTION CRT-P N/A 12/14/2018   Medtronic model [id number] Percepta Quad CRT-P MRI Surescan (serial Number [id number]) device implanted by Dr [name] for CHF and LBBB   CARDIOVERSION N/A 08/06/2013   Procedure: CARDIOVERSION;  Surgeon: Lelon Perla, MD;  Location[facility] ENDOSCOPY;  Service: Cardiovascular;  Laterality: N/A;  spoke with [name]    CARDIOVERSION N/A 09/03/2013   Procedure: CARDIOVERSION;  Surgeon: [name], MD;  Location: [facility] ENDOSCOPY;  Service: Cardiovascular;  Laterality: N/A;   chronic systolic dysfu     NASAL SEPTUM SURGERY     RIGHT/LEFT HEART CATH AND CORONARY ANGIOGRAPHY N/A 12/04/2018   Procedure: RIGHT/LEFT HEART CATH AND CORONARY ANGIOGRAPHY;  Surgeon: Belva Crome, MD;  Location: [facility] CV LAB;  Service: Cardiovascular;  Laterality: N/A;   TEE WITHOUT CARDIOVERSION N/A 08/06/2013   Procedure: TRANSESOPHAGEAL ECHOCARDIOGRAM (TEE);  Surgeon: Lelon Perla, MD;  Location: [facility] ENDOSCOPY;  Service: Cardiovascular;  Laterality: N/A;   VASECTOMY      Current Outpatient Medications  Medication Sig Dispense Refill   acetaminophen (TYLENOL) 650 MG CR tablet Take 1,300 mg by mouth every 8 (eight) hours as needed for pain.      ADVAIR DISKUS 250-50 MCG/DOSE AEPB Inhale 1  puff into the lungs 2 (two) times daily.     apixaban (ELIQUIS) 5 MG TABS tablet Take 1 tablet (5 mg total) by mouth 2 (two) times daily. 180 tablet 2   carvedilol (COREG) 12.5 MG tablet Take 1 tablet (12.5 mg total) by mouth 2 (two) times daily with a meal. 60 tablet 3   cholecalciferol (VITAMIN D3) 25 MCG (1000 UT) tablet Take 1,000 Units by mouth daily.     CIALIS 20 MG tablet Take 20 mg by mouth daily as needed for erectile dysfunction.   0   cyclobenzaprine (FLEXERIL) 10  MG tablet Take 10 mg by mouth as needed.     empagliflozin (JARDIANCE) 10 MG TABS tablet Take 1 tablet (10 mg total) by mouth daily before breakfast. 90 tablet 3   fluticasone (FLONASE) 50 MCG/ACT nasal spray Place 1 spray into both nostrils 2 (two) times daily.   0   furosemide (LASIX) 40 MG tablet TAKE 2 TABLETS(80 MG) BY MOUTH TWICE DAILY 360 tablet 1   omeprazole (PRILOSEC OTC) 20 MG tablet Take 20 mg by mouth daily.     potassium chloride SA (KLOR-CON) 20 MEQ tablet Take 1 tablet (20 mEq total) by mouth daily. 90 tablet 3   sacubitril-valsartan (ENTRESTO) 97-103 MG Take 1 tablet by mouth 2 (two) times daily. 180 tablet 3   spironolactone (ALDACTONE) 25 MG tablet Take 1 tablet (25 mg total) by mouth daily. 90 tablet 3   vitamin B-12 (CYANOCOBALAMIN) 1000 MCG tablet Take 1,000 mcg by mouth daily.     zolpidem (AMBIEN CR) 12.5 MG CR tablet Take 1 tablet by mouth at bedtime as needed for sleep.      No current facility-administered medications for this encounter.    No Known Allergies  Social History   Socioeconomic History   Marital status: Married    Spouse name: Not on file   Number of children: Not on file   Years of education: Not on file   Highest education level: Not on file  Occupational History   Occupation: real estate    Comment: investment  Tobacco Use   Smoking status: Former    Packs/day: 1.50    Years: 43.00    Pack years: 64.50    Types: Cigarettes, Cigars    Quit date: 08/02/2012    Years since quitting: 8.9   Smokeless tobacco: Never  Vaping Use   Vaping Use: Never used  Substance and Sexual Activity   Alcohol use: Yes    Alcohol/week: 0.0 standard drinks    Comment: once a week; previously 3-4 times per week    Drug use: No   Sexual activity: Yes  Other Topics Concern   Not on file  Social History Narrative   Works as a Engineer, structural for apartments   Lives with wife.  They have one grown daughter   Highest level of education:  college    Social Determinants of Health   Financial Resource Strain: Not on file  Food Insecurity: Not on file  Transportation Needs: Not on file  Physical Activity: Not on file  Stress: Not on file  Social Connections: Not on file  Intimate Partner Violence: Not on file    Family History  Problem Relation Age of Onset   Atrial fibrillation Mother    COPD Father    Cerebral aneurysm Brother    Healthy Daughter     ROS- All systems are reviewed and negative except as per the HPI above  Physical  Exam: Vitals:   07/15/21 1318  Weight: (!) 144.9 kg  Height: '5\' 11"'$  (1.803 m)   Wt Readings from Last 3 Encounters:  07/15/21 (!) 144.9 kg  06/30/21 (!) 145.6 kg  03/16/21 (!) 148.7 kg    Labs: Lab Results  Component Value Date   NA 136 05/04/2021   K 4.4 05/04/2021   CL 100 05/04/2021   CO2 19 (L) 05/04/2021   GLUCOSE 92 05/04/2021   BUN 20 05/04/2021   CREATININE 1.25 05/04/2021   CALCIUM 8.9 05/04/2021   MG 2.1 01/15/2020   Lab Results  Component Value Date   INR 1.2 11/17/2019   Lab Results  Component Value Date   CHOL 184 08/03/2013   HDL 50 08/03/2013   LDLCALC 104 (H) 08/03/2013   TRIG 149 08/03/2013     GEN- The patient is well appearing, alert and oriented x 3 today.   Head- normocephalic, atraumatic Eyes-  Sclera clear, conjunctiva pink Ears- hearing intact Oropharynx- clear Neck- supple, no JVP Lymph- no cervical lymphadenopathy Lungs- Clear to ausculation bilaterally, normal work of breathing Heart- irregular rate and rhythm, no murmurs, rubs or gallops, PMI not laterally displaced GI- soft, NT, ND, + BS Extremities- no clubbing, cyanosis, or edema MS- no significant deformity or atrophy Skin- no rash or lesion Psych- euthymic mood, full affect Neuro- strength and sensation are intact  EKG- afib at 88 bpm, qrs int 130 ms, qtc 486 ms     Assessment and Plan:  1. Persistent afib  Return to persistent  afib on 8/18 Probable trigger was recent   prednisone taper  He is rate controlled but symptomatic I will plan on earliest cardioversion  He states that the cardioversion he had many years ago, with his thick chest wall, required more energy with  a stronger defibrillator and multiple shocks First available is next Tuesday. Pt is Dr. Clayborne Dana but he is off that day   I spoke to Dr. Aundra Dubin and he is willing to perform the cardioversion. He has used double defibrillators in the past,  if needed   Cbc/bmet today   2. CHA2DS2VASc  score of 4 He states no missed doses of eliquis 5 mg bid for at least  3 weeks   3. Chronic combined systolic and diastolic heart failure  He retained fluid since prednisone taper Continue current meds for HF He took an extra lasix yesterday and feels better today  Weight appears stable   F/u here in one week   Butch Penny C. Juwuan Sedita, [facility] [address] [place], [place] [address] [phone]

## 2021-07-15 NOTE — Telephone Encounter (Signed)
Called and left message for patient to call back to schedule appt. 

## 2021-07-20 ENCOUNTER — Ambulatory Visit (INDEPENDENT_AMBULATORY_CARE_PROVIDER_SITE_OTHER): Payer: 59

## 2021-07-20 DIAGNOSIS — Z950 Presence of cardiac pacemaker: Secondary | ICD-10-CM

## 2021-07-20 DIAGNOSIS — I5042 Chronic combined systolic (congestive) and diastolic (congestive) heart failure: Secondary | ICD-10-CM

## 2021-07-20 NOTE — Progress Notes (Signed)
EPIC Encounter for ICM Monitoring  Patient Name: Nicholas Foley is a 70 y.o. male Date: 07/20/2021 Primary Care Physican: [name], MD Primary Cardiologist: Smith/Bensimhon Electrophysiologist: Allred Bi-V Pacing: 65.3% (decreased from 91% on 06/08/21 report)     07/15/2021 Office Weight: 319 lbs (does not weigh at home)   Since 12-Jul-2021 Time in AT/AF  4Time in AT/AF 24.0 hr/day (100.0%)          Spoke with patient. He reports he still is not feeling well due to heart is out of rhythm.  Pt seen in AF Clinic 8/24 and cardioversion scheduled for 8/30.  He has been working to get fluid accumulation resolved.                                       Optivol thoracic impedance improving but continues to suggest possible fluid accumulation since 8/16.     Prescribed: Furosemide 40 mg take 2 tablets (80 mg total) twice a day.  Potassium 20 mEq take 1 tablet daily   Labs: 05/04/2021 Creatinine 1.25, BUN 20, Potassium 4.4, Sodium 136, GFR 63 01/07/2021 Creatinine 1.20, BUN 17, Potassium 3.7, Sodium 139, GFR >60 A complete set of results can be found in Results Review.   Recommendations: Pt self adjusting Furosemide and taking extra to help decrease fluid retention.    Follow-up plan: ICM clinic phone appointment on 07/25/2021 to recheck fluid levels.   91 day device clinic remote transmission 07/22/2021.     EP/Cardiology Office Visits:    Recall for 01/14/2021 with Dr [name].  Recall 07/06/2021 with Dr Haroldine Laws   Copy of ICM check sent to Dr. [name].   3 month ICM trend: 07/19/2021.    1 Year ICM trend:       Rosalene Billings, RN 07/20/2021 1:38 PM

## 2021-07-21 ENCOUNTER — Ambulatory Visit (HOSPITAL_COMMUNITY): Payer: 59 | Admitting: Anesthesiology

## 2021-07-21 ENCOUNTER — Encounter (HOSPITAL_COMMUNITY)
Admission: RE | Disposition: A | Discharge status: Home / Self Care | Payer: Self-pay | Source: Home / Self Care | Attending: Cardiology

## 2021-07-21 ENCOUNTER — Other Ambulatory Visit: Payer: Self-pay

## 2021-07-21 ENCOUNTER — Encounter (HOSPITAL_COMMUNITY): Payer: Self-pay | Admitting: Cardiology

## 2021-07-21 ENCOUNTER — Ambulatory Visit (HOSPITAL_COMMUNITY)
Admission: RE | Admit: 2021-07-21 | Discharge: 2021-07-21 | Disposition: A | Discharge status: Home / Self Care | Payer: 59 | Attending: Cardiology | Admitting: Cardiology

## 2021-07-21 DIAGNOSIS — Z6841 Body Mass Index (BMI) 40.0 and over, adult: Secondary | ICD-10-CM | POA: Insufficient documentation

## 2021-07-21 DIAGNOSIS — I4891 Unspecified atrial fibrillation: Secondary | ICD-10-CM | POA: Diagnosis present

## 2021-07-21 DIAGNOSIS — J449 Chronic obstructive pulmonary disease, unspecified: Secondary | ICD-10-CM | POA: Diagnosis not present

## 2021-07-21 DIAGNOSIS — G4733 Obstructive sleep apnea (adult) (pediatric): Secondary | ICD-10-CM | POA: Insufficient documentation

## 2021-07-21 DIAGNOSIS — Z7901 Long term (current) use of anticoagulants: Secondary | ICD-10-CM | POA: Insufficient documentation

## 2021-07-21 DIAGNOSIS — Z79899 Other long term (current) drug therapy: Secondary | ICD-10-CM | POA: Insufficient documentation

## 2021-07-21 DIAGNOSIS — I110 Hypertensive heart disease with heart failure: Secondary | ICD-10-CM | POA: Diagnosis not present

## 2021-07-21 DIAGNOSIS — I454 Nonspecific intraventricular block: Secondary | ICD-10-CM | POA: Insufficient documentation

## 2021-07-21 DIAGNOSIS — Z7951 Long term (current) use of inhaled steroids: Secondary | ICD-10-CM | POA: Diagnosis not present

## 2021-07-21 DIAGNOSIS — Z87891 Personal history of nicotine dependence: Secondary | ICD-10-CM | POA: Insufficient documentation

## 2021-07-21 DIAGNOSIS — Z8616 Personal history of COVID-19: Secondary | ICD-10-CM | POA: Diagnosis not present

## 2021-07-21 DIAGNOSIS — K219 Gastro-esophageal reflux disease without esophagitis: Secondary | ICD-10-CM | POA: Diagnosis not present

## 2021-07-21 DIAGNOSIS — I5022 Chronic systolic (congestive) heart failure: Secondary | ICD-10-CM | POA: Insufficient documentation

## 2021-07-21 HISTORY — PX: CARDIOVERSION: SHX1299

## 2021-07-21 SURGERY — CARDIOVERSION
Anesthesia: General

## 2021-07-21 MED ORDER — LIDOCAINE 2% (20 MG/ML) 5 ML SYRINGE
INTRAMUSCULAR | Status: DC | PRN
  Administered 2021-07-21: 30 mg via INTRAVENOUS

## 2021-07-21 MED ORDER — PROPOFOL 10 MG/ML IV BOLUS
INTRAVENOUS | Status: DC | PRN
  Administered 2021-07-21: 150 mg via INTRAVENOUS

## 2021-07-21 MED ORDER — SODIUM CHLORIDE 0.9 % IV SOLN: INTRAVENOUS | Status: DC | PRN

## 2021-07-21 NOTE — Procedures (Signed)
Electrical Cardioversion Procedure Note Nicholas Foley [id number] 1951-11-10  Procedure: Electrical Cardioversion Indications:  Atrial Fibrillation  Procedure Details Consent: Risks of procedure as well as the alternatives and risks of each were explained to the (patient/caregiver).  Consent for procedure obtained. Time Out: Verified patient identification, verified procedure, site/side was marked, verified correct patient position, special equipment/implants available, medications/allergies/relevent history reviewed, required imaging and test results available.  Performed  Patient placed on cardiac monitor, pulse oximetry, supplemental oxygen as necessary.  Sedation given:  Propofol per anesthesiology Pacer pads placed anterior and posterior chest.  Cardioverted 1 time(s).  Cardioverted at [place].  Evaluation Findings: Post procedure EKG shows: NSR Complications: None Patient did tolerate procedure well.   Loralie Champagne 07/21/2021, 8:31 AM

## 2021-07-21 NOTE — Anesthesia Preprocedure Evaluation (Addendum)
Anesthesia Evaluation  Patient identified by MRN, date of birth, ID band Patient awake    Reviewed: Allergy & Precautions, NPO status , Patient's Chart, lab work & pertinent test results, reviewed documented beta blocker date and time   History of Anesthesia Complications Negative for: history of anesthetic complications  Airway Mallampati: II  TM Distance: >3 FB Neck ROM: Full    Dental  (+) Caps, Dental Advisory Given   Pulmonary sleep apnea and Continuous Positive Airway Pressure Ventilation , COPD,  COPD inhaler, former smoker,    breath sounds clear to auscultation       Cardiovascular hypertension, Pt. on medications and Pt. on home beta blockers (-) angina+CHF Nicholas Foley)  + dysrhythmias Atrial Fibrillation  Rhythm:Irregular Rate:Tachycardia  12/2020 ECHO: EF 40%, mod decreased LVF, global hypokinesis, mild LVH, no significant valvular abnormalities   Neuro/Psych negative neurological ROS     GI/Hepatic Neg liver ROS, GERD  Medicated and Controlled,  Endo/Other  Morbid obesity  Renal/GU negative Renal ROS     Musculoskeletal   Abdominal (+) + obese,   Peds  Hematology eliquis   Anesthesia Other Findings   Reproductive/Obstetrics                            Anesthesia Physical Anesthesia Plan  ASA: 4  Anesthesia Plan: General   Post-op Pain Management:    Induction: Intravenous  PONV Risk Score and Plan: 2 and Treatment may vary due to age or medical condition  Airway Management Planned: Natural Airway and Mask  Additional Equipment: None  Intra-op Plan:   Post-operative Plan:   Informed Consent: I have reviewed the patients History and Physical, chart, labs and discussed the procedure including the risks, benefits and alternatives for the proposed anesthesia with the patient or authorized representative who has indicated his/her understanding and acceptance.     Dental  advisory given  Plan Discussed with: CRNA and Surgeon  Anesthesia Plan Comments:        Anesthesia Quick Evaluation

## 2021-07-21 NOTE — Discharge Instructions (Signed)

## 2021-07-21 NOTE — Transfer of Care (Signed)
Immediate Anesthesia Transfer of Care Note  Patient: Nicholas Foley  Procedure(s) Performed: CARDIOVERSION  Patient Location: Endoscopy Unit  Anesthesia Type:General  Level of Consciousness: drowsy and patient cooperative  Airway & Oxygen Therapy: Patient Spontanous Breathing and Patient connected to face mask oxygen  Post-op Assessment: Report given to RN and Post -op Vital signs reviewed and stable  Post vital signs: Reviewed and stable  Last Vitals:  Vitals Value Taken Time  BP 150/79   Temp    Pulse 74 07/21/21 0817  Resp 24 07/21/21 0817  SpO2 97 % 07/21/21 0817  Vitals shown include unvalidated device data.  Last Pain:  Vitals:   07/21/21 0743  TempSrc: Temporal  PainSc: 0-No pain         Complications: No notable events documented.

## 2021-07-21 NOTE — Anesthesia Procedure Notes (Signed)
Procedure Name: General with mask airway Date/Time: 07/21/2021 8:19 AM Performed by: Kathryne Hitch, CRNA Pre-anesthesia Checklist: Patient identified, Suction available, Patient being monitored, Emergency Drugs available and Timeout performed Patient Re-evaluated:Patient Re-evaluated prior to induction Oxygen Delivery Method: Simple face mask Preoxygenation: Pre-oxygenation with 100% oxygen Induction Type: IV induction Dental Injury: Teeth and Oropharynx as per pre-operative assessment

## 2021-07-21 NOTE — Anesthesia Postprocedure Evaluation (Signed)
Anesthesia Post Note  Patient: Nicholas Foley  Procedure(s) Performed: CARDIOVERSION     Patient location during evaluation: Endoscopy Anesthesia Type: General Level of consciousness: awake and alert, patient cooperative and oriented Pain management: pain level controlled Vital Signs Assessment: post-procedure vital signs reviewed and stable Respiratory status: nonlabored ventilation, spontaneous breathing and respiratory function stable Cardiovascular status: blood pressure returned to baseline and stable Postop Assessment: no apparent nausea or vomiting and able to ambulate Anesthetic complications: no   No notable events documented.  Last Vitals:  Vitals:   07/21/21 0845 07/21/21 0855  BP: 138/88 122/74  Pulse: 78 78  Resp: 20 (!) 22  Temp:    SpO2: 96% 94%    Last Pain:  Vitals:   07/21/21 0855  TempSrc:   PainSc: 0-No pain                 Nicholas Foley,E. Romuald Mccaslin

## 2021-07-21 NOTE — Interval H&P Note (Signed)
History and Physical Interval Note:  07/21/2021 8:24 AM  [name]  has presented today for surgery, with the diagnosis of AFIB.  The various methods of treatment have been discussed with the patient and family. After consideration of risks, benefits and other options for treatment, the patient has consented to  Procedure(s): CARDIOVERSION (N/A) as a surgical intervention.  The patient's history has been reviewed, patient examined, no change in status, stable for surgery.  I have reviewed the patient's chart and labs.  Questions were answered to the patient's satisfaction.     Mady Oubre [employer]

## 2021-07-22 ENCOUNTER — Ambulatory Visit (INDEPENDENT_AMBULATORY_CARE_PROVIDER_SITE_OTHER): Payer: 59

## 2021-07-22 DIAGNOSIS — I428 Other cardiomyopathies: Secondary | ICD-10-CM | POA: Diagnosis not present

## 2021-07-22 LAB — CUP PACEART REMOTE DEVICE CHECK
Battery Remaining Longevity: 119 mo
Battery Voltage: 2.98 V
Brady Statistic RA Percent Paced: 2.07 %
Brady Statistic RV Percent Paced: 65.34 %
Date Time Interrogation Session: 20220828201508
Implantable Lead Implant Date: 20200123
Implantable Lead Implant Date: 20200123
Implantable Lead Implant Date: 20200123
Implantable Lead Location: 753858
Implantable Lead Location: 753859
Implantable Lead Location: 753860
Implantable Lead Model: 4598
Implantable Lead Model: 5076
Implantable Lead Model: 5076
Implantable Pulse Generator Implant Date: 20200123
Lead Channel Impedance Value: 1007 Ohm
Lead Channel Impedance Value: 1045 Ohm
Lead Channel Impedance Value: 1159 Ohm
Lead Channel Impedance Value: 1311 Ohm
Lead Channel Impedance Value: 1349 Ohm
Lead Channel Impedance Value: 361 Ohm
Lead Channel Impedance Value: 418 Ohm
Lead Channel Impedance Value: 418 Ohm
Lead Channel Impedance Value: 532 Ohm
Lead Channel Impedance Value: 551 Ohm
Lead Channel Impedance Value: 684 Ohm
Lead Channel Impedance Value: 703 Ohm
Lead Channel Impedance Value: 836 Ohm
Lead Channel Impedance Value: 950 Ohm
Lead Channel Pacing Threshold Amplitude: 0.5 V
Lead Channel Pacing Threshold Amplitude: 0.75 V
Lead Channel Pacing Threshold Amplitude: 1.5 V
Lead Channel Pacing Threshold Pulse Width: 0.4 ms
Lead Channel Pacing Threshold Pulse Width: 0.4 ms
Lead Channel Pacing Threshold Pulse Width: 0.5 ms
Lead Channel Sensing Intrinsic Amplitude: 0.375 mV
Lead Channel Sensing Intrinsic Amplitude: 0.375 mV
Lead Channel Sensing Intrinsic Amplitude: 8.625 mV
Lead Channel Sensing Intrinsic Amplitude: 8.625 mV
Lead Channel Setting Pacing Amplitude: 1.5 V
Lead Channel Setting Pacing Amplitude: 2 V
Lead Channel Setting Pacing Amplitude: 2.5 V
Lead Channel Setting Pacing Pulse Width: 0.4 ms
Lead Channel Setting Pacing Pulse Width: 0.5 ms
Lead Channel Setting Sensing Sensitivity: 2 mV

## 2021-07-23 ENCOUNTER — Encounter (HOSPITAL_COMMUNITY): Payer: Self-pay | Admitting: Cardiology

## 2021-07-24 ENCOUNTER — Ambulatory Visit: Payer: 59

## 2021-07-24 ENCOUNTER — Telehealth: Payer: Self-pay

## 2021-07-24 DIAGNOSIS — I5042 Chronic combined systolic (congestive) and diastolic (congestive) heart failure: Secondary | ICD-10-CM

## 2021-07-24 DIAGNOSIS — Z950 Presence of cardiac pacemaker: Secondary | ICD-10-CM

## 2021-07-24 NOTE — Telephone Encounter (Signed)
Remote ICM transmission received.  Attempted call to patient regarding ICM remote transmission and left detailed message per DPR.  Advised to return call for any fluid symptoms or questions. Next ICM remote transmission scheduled 08/05/2021.

## 2021-07-24 NOTE — Progress Notes (Signed)
EPIC Encounter for ICM Monitoring  Patient Name: Nicholas Foley is a 70 y.o. male Date: 07/24/2021 Primary Care Physican: [name], MD Primary Cardiologist: Smith/Bensimhon Electrophysiologist: Allred Bi-V Pacing: 96%    07/15/2021 Office Weight: 319 lbs (does not weigh at home)            Attempted call to patient and unable to reach.  Left detailed message per DPR regarding transmission. Transmission reviewed.   Cardioversion completed 8/30.                                   Optivol thoracic impedance improving but continues to suggest possible fluid accumulation since 8/16.     Prescribed: Furosemide 40 mg take 2 tablets (80 mg total) twice a day.  Potassium 20 mEq take 1 tablet daily   Labs: 05/04/2021 Creatinine 1.25, BUN 20, Potassium 4.4, Sodium 136, GFR 63 01/07/2021 Creatinine 1.20, BUN 17, Potassium 3.7, Sodium 139, GFR >60 A complete set of results can be found in Results Review.   Recommendations: Left voice mail with ICM number and encouraged to call if experiencing any fluid symptoms.   Follow-up plan: ICM clinic phone appointment on 08/05/2021 to recheck fluid levels.   91 day device clinic remote transmission 07/22/2021.     EP/Cardiology Office Visits:    Recall for 01/14/2021 with Dr [name].  Recall 07/06/2021 with Dr Haroldine Laws   Copy of ICM check sent to Dr. [name].   3 month ICM trend: 07/23/2021.    1 Year ICM trend:       Rosalene Billings, RN 07/24/2021 4:06 PM

## 2021-07-28 ENCOUNTER — Telehealth (HOSPITAL_COMMUNITY): Payer: Self-pay | Admitting: Vascular Surgery

## 2021-07-28 ENCOUNTER — Other Ambulatory Visit: Payer: Self-pay

## 2021-07-28 ENCOUNTER — Encounter (HOSPITAL_COMMUNITY): Payer: Self-pay | Admitting: Nurse Practitioner

## 2021-07-28 ENCOUNTER — Ambulatory Visit (HOSPITAL_COMMUNITY)
Admission: RE | Admit: 2021-07-28 | Discharge: 2021-07-28 | Disposition: A | Discharge status: Home / Self Care | Payer: 59 | Source: Ambulatory Visit | Attending: Nurse Practitioner | Admitting: Nurse Practitioner

## 2021-07-28 VITALS — BP 132/76 | HR 81 | Ht 71.0 in | Wt 319.2 lb

## 2021-07-28 DIAGNOSIS — Z6841 Body Mass Index (BMI) 40.0 and over, adult: Secondary | ICD-10-CM | POA: Diagnosis not present

## 2021-07-28 DIAGNOSIS — I5042 Chronic combined systolic (congestive) and diastolic (congestive) heart failure: Secondary | ICD-10-CM | POA: Insufficient documentation

## 2021-07-28 DIAGNOSIS — D6869 Other thrombophilia: Secondary | ICD-10-CM | POA: Diagnosis not present

## 2021-07-28 DIAGNOSIS — J449 Chronic obstructive pulmonary disease, unspecified: Secondary | ICD-10-CM | POA: Insufficient documentation

## 2021-07-28 DIAGNOSIS — Z8616 Personal history of COVID-19: Secondary | ICD-10-CM | POA: Insufficient documentation

## 2021-07-28 DIAGNOSIS — Z950 Presence of cardiac pacemaker: Secondary | ICD-10-CM | POA: Diagnosis not present

## 2021-07-28 DIAGNOSIS — I480 Paroxysmal atrial fibrillation: Secondary | ICD-10-CM

## 2021-07-28 DIAGNOSIS — Z7901 Long term (current) use of anticoagulants: Secondary | ICD-10-CM | POA: Insufficient documentation

## 2021-07-28 DIAGNOSIS — Z79899 Other long term (current) drug therapy: Secondary | ICD-10-CM | POA: Diagnosis not present

## 2021-07-28 DIAGNOSIS — I4819 Other persistent atrial fibrillation: Secondary | ICD-10-CM | POA: Insufficient documentation

## 2021-07-28 DIAGNOSIS — Z87891 Personal history of nicotine dependence: Secondary | ICD-10-CM | POA: Diagnosis not present

## 2021-07-28 DIAGNOSIS — I110 Hypertensive heart disease with heart failure: Secondary | ICD-10-CM | POA: Insufficient documentation

## 2021-07-28 NOTE — Progress Notes (Signed)
Primary Care Physician: Nicholas Small, MD Referring Physician: Device clinic EP: Dr. Rayann Foley Cardiologist: Dr. Faythe Foley AHF- Dr. [name] is a 70 y.o. male with a h/o  of NICM, Chronic CHF (systolic=> EF [id number], [id number]) ), COPD (prior smoker), OSA (w/CPAP), AFib, HTN, LBBB, CRT-P, morbid obesity, prior amiodarone therapy (DC 2018), and prior smoker. Suffered COVID-19 infection in December 2020.  He is in the afib clinic per the device clinic, the pt has been noted to be in afib since 8/18. He had recently been on an an antibiotic and  prednisone taper for  f/u long haul Covid  and a "spot on his lung." He feels he has retained fluid during this time. He has been instructed to take an extra lasix. Had previously been treated on Amiodarone, per Epic,  but pt does not remember this drug. EKG today shows  afib at 88 bpm. He feels very fatigued with afib. Recommendation from the long Covid clinic at [facility] was exercise and he has been able to regain a lot of his strength through regular exercsie program and  working with a Physiological scientist. HE was feeling really good until recent with return of afib. He is anxious to get back to exercise and get his energy back. No missed anticoagulation for a CHA2DS2VASc score of 4.   F/u in afib clinic, 07/28/21. He had successful cardioversion and remains in SR today. He has felt really good until yesterday and today. His fluid status is stable, actually done a few lbs. He states with long Covid, at times will have days he does not feel as well.   Today, he denies symptoms of palpitations, chest pain, shortness of breath, orthopnea, PND, lower extremity edema, dizziness, presyncope, syncope, or neurologic sequela. The patient is tolerating medications without difficulties and is otherwise without complaint today.   Past Medical History:  Diagnosis Date   Atrial fibrillation with rapid ventricular response ([place]) 08/02/2013   CHF (congestive heart failure)  (HCC)    Chronic systolic dysfunction of left ventricle 08/02/2013   BNP greater than 2000    COPD (chronic obstructive pulmonary disease) (HCC)    Moderate by PFTs with response to bronchodilators, May 2014   Erectile dysfunction    Hypertension    Insomnia 08/02/13   Left bundle branch block 08/02/2013   Morbid obesity ([place]) 08/02/2013   OSA (obstructive sleep apnea)    severe OSA with AHI 74/hr now on CPAP at 8cm H2O   Past Surgical History:  Procedure Laterality Date   BIV PACEMAKER INSERTION CRT-P N/A 12/14/2018   Medtronic model [id number] Percepta Quad CRT-P MRI Surescan (serial Number [id number]) device implanted by Dr [facility] for CHF and LBBB   CARDIOVERSION N/A 08/06/2013   Procedure: CARDIOVERSION;  Surgeon: Nicholas Perla, MD;  Location: [facility] ENDOSCOPY;  Service: Cardiovascular;  Laterality: N/A;  spoke with [facility]    CARDIOVERSION N/A 09/03/2013   Procedure: CARDIOVERSION;  Surgeon: Nicholas Grooms, MD;  Location: [facility];  Service: Cardiovascular;  Laterality: N/A;   CARDIOVERSION N/A 07/21/2021   Procedure: CARDIOVERSION;  Surgeon: Nicholas Dresser, MD;  Location: [facility] ENDOSCOPY;  Service: Cardiovascular;  Laterality: N/A;   chronic systolic dysfu     NASAL SEPTUM SURGERY     RIGHT/LEFT HEART CATH AND CORONARY ANGIOGRAPHY N/A 12/04/2018   Procedure: RIGHT/LEFT HEART CATH AND CORONARY ANGIOGRAPHY;  Surgeon: Nicholas Crome, MD;  Location: [facility] CV LAB;  Service: Cardiovascular;  Laterality: N/A;  TEE WITHOUT CARDIOVERSION N/A 08/06/2013   Procedure: TRANSESOPHAGEAL ECHOCARDIOGRAM (TEE);  Surgeon: Nicholas Perla, MD;  Location: [facility] ENDOSCOPY;  Service: Cardiovascular;  Laterality: N/A;   VASECTOMY      Current Outpatient Medications  Medication Sig Dispense Refill   acetaminophen (TYLENOL) 650 MG CR tablet Take 1,300 mg by mouth every 8 (eight) hours as needed for pain.      ADVAIR DISKUS 250-50 MCG/DOSE AEPB Inhale 1 puff into the lungs 2 (two) times daily.      apixaban (ELIQUIS) 5 MG TABS tablet Take 1 tablet (5 mg total) by mouth 2 (two) times daily. 180 tablet 2   carvedilol (COREG) 12.5 MG tablet Take 1 tablet (12.5 mg total) by mouth 2 (two) times daily with a meal. 60 tablet 3   cholecalciferol (VITAMIN D3) 25 MCG (1000 UT) tablet Take 1,000 Units by mouth daily.     CIALIS 20 MG tablet Take 20 mg by mouth daily as needed for erectile dysfunction.   0   cyclobenzaprine (FLEXERIL) 10 MG tablet Take 10 mg by mouth 3 (three) times daily.     empagliflozin (JARDIANCE) 10 MG TABS tablet Take 1 tablet (10 mg total) by mouth daily before breakfast. 90 tablet 3   fluticasone (FLONASE) 50 MCG/ACT nasal spray Place 1 spray into both nostrils 2 (two) times daily.   0   furosemide (LASIX) 40 MG tablet TAKE 2 TABLETS(80 MG) BY MOUTH TWICE DAILY 360 tablet 1   Multiple Vitamins-Minerals (PRESERVISION AREDS PO) Take 2 capsules by mouth daily in the afternoon.     omeprazole (PRILOSEC OTC) 20 MG tablet Take 20 mg by mouth daily.     potassium chloride SA (KLOR-CON) 20 MEQ tablet Take 1 tablet (20 mEq total) by mouth daily. 90 tablet 3   sacubitril-valsartan (ENTRESTO) 97-103 MG Take 1 tablet by mouth 2 (two) times daily. 180 tablet 3   spironolactone (ALDACTONE) 25 MG tablet Take 1 tablet (25 mg total) by mouth daily. 90 tablet 3   vitamin B-12 (CYANOCOBALAMIN) 1000 MCG tablet Take 1,000 mcg by mouth daily.     zolpidem (AMBIEN CR) 12.5 MG CR tablet Take 1 tablet by mouth at bedtime.     No current facility-administered medications for this encounter.    No Known Allergies  Social History   Socioeconomic History   Marital status: Married    Spouse name: Not on file   Number of children: Not on file   Years of education: Not on file   Highest education level: Not on file  Occupational History   Occupation: real estate    Comment: investment  Tobacco Use   Smoking status: Former    Packs/day: 1.50    Years: 43.00    Pack years: 64.50    Types:  Cigarettes, Cigars    Quit date: 08/02/2012    Years since quitting: 8.9   Smokeless tobacco: Never  Vaping Use   Vaping Use: Never used  Substance and Sexual Activity   Alcohol use: Yes    Alcohol/week: 0.0 standard drinks    Comment: once a week; previously 3-4 times per week    Drug use: No   Sexual activity: Yes  Other Topics Concern   Not on file  Social History Narrative   Works as a Engineer, structural for apartments   Lives with wife.  They have one grown daughter   Highest level of education:  college   Social Determinants of Radio broadcast assistant  Strain: Not on file  Food Insecurity: Not on file  Transportation Needs: Not on file  Physical Activity: Not on file  Stress: Not on file  Social Connections: Not on file  Intimate Partner Violence: Not on file    Family History  Problem Relation Age of Onset   Atrial fibrillation Mother    COPD Father    Cerebral aneurysm Brother    Healthy Daughter     ROS- All systems are reviewed and negative except as per the HPI above  Physical Exam: Vitals:   07/28/21 1352  BP: 132/76  Pulse: 81  Weight: (!) 144.8 kg  Height: '5\' 11"'$  (1.803 m)   Wt Readings from Last 3 Encounters:  07/28/21 (!) 144.8 kg  07/21/21 (!) 147.4 kg  07/15/21 (!) 144.9 kg    Labs: Lab Results  Component Value Date   NA 138 07/15/2021   K 3.7 07/15/2021   CL 102 07/15/2021   CO2 27 07/15/2021   GLUCOSE 104 (H) 07/15/2021   BUN 17 07/15/2021   CREATININE 1.08 07/15/2021   CALCIUM 8.5 (L) 07/15/2021   MG 2.1 01/15/2020   Lab Results  Component Value Date   INR 1.2 11/17/2019   Lab Results  Component Value Date   CHOL 184 08/03/2013   HDL 50 08/03/2013   LDLCALC 104 (H) 08/03/2013   TRIG 149 08/03/2013     GEN- The patient is well appearing, alert and oriented x 3 today.   Head- normocephalic, atraumatic Eyes-  Sclera clear, conjunctiva pink Ears- hearing intact Oropharynx- clear Neck- supple, no JVP Lymph- no  cervical lymphadenopathy Lungs- Clear to ausculation bilaterally, normal work of breathing Heart- regular rate and rhythm, no murmurs, rubs or gallops, PMI not laterally displaced GI- soft, NT, ND, + BS Extremities- no clubbing, cyanosis, or edema MS- no significant deformity or atrophy Skin- no rash or lesion Psych- euthymic mood, full affect Neuro- strength and sensation are intact  EKG-  atrial sensed with v paced rhythm, v rate 81 bpm, qrs int 144 ms, qtc 534 ms     Assessment and Plan:  1. Persistent afib  Return to persistent  afib on 8/18 Probable trigger was recent  prednisone taper  He was  rate controlled but symptomatic Has successful cardioversion  07/21/21 and remains in SR  2. CHA2DS2VASc  score of 4 He states no missed doses of eliquis 5 mg bid    3. Chronic combined systolic and diastolic heart failure  He retained fluid after prednisone taper Continue current meds for HF Took an extra lasix yesterday and  fluid retention resolved    Pt missed his recall in August with Dr. Haroldine Laws . Pt states he was not notified to make an appointment.  Will ask for him to get back in the schedule.  Afib clinic as needed    Geroge Baseman. Lilyannah Zuelke, [facility] [address] [place], [place] [address] [phone]

## 2021-07-28 NOTE — Telephone Encounter (Signed)
Left pt VM giving f/u appt w/ db 11/3 @ 2:20 W/ DB, asked pt to call back to confirm

## 2021-07-31 ENCOUNTER — Other Ambulatory Visit (HOSPITAL_COMMUNITY): Payer: Self-pay

## 2021-07-31 MED ORDER — SPIRONOLACTONE 25 MG PO TABS: 25.0000 mg | ORAL_TABLET | Freq: Every day | ORAL | 1 refills | Status: DC

## 2021-08-04 NOTE — Progress Notes (Signed)
Remote pacemaker transmission.   

## 2021-08-05 ENCOUNTER — Other Ambulatory Visit: Payer: Self-pay

## 2021-08-05 ENCOUNTER — Ambulatory Visit
Admission: RE | Admit: 2021-08-05 | Discharge: 2021-08-05 | Disposition: A | Discharge status: Home / Self Care | Payer: 59 | Source: Ambulatory Visit | Attending: Acute Care | Admitting: Acute Care

## 2021-08-05 ENCOUNTER — Ambulatory Visit (INDEPENDENT_AMBULATORY_CARE_PROVIDER_SITE_OTHER): Payer: 59

## 2021-08-05 DIAGNOSIS — Z950 Presence of cardiac pacemaker: Secondary | ICD-10-CM

## 2021-08-05 DIAGNOSIS — I5022 Chronic systolic (congestive) heart failure: Secondary | ICD-10-CM

## 2021-08-05 DIAGNOSIS — Z87891 Personal history of nicotine dependence: Secondary | ICD-10-CM

## 2021-08-07 ENCOUNTER — Telehealth: Payer: Self-pay

## 2021-08-07 NOTE — Telephone Encounter (Signed)
Remote ICM transmission received.  Attempted call to patient regarding ICM remote transmission and left detailed message per DPR.  Advised to return call for any fluid symptoms or questions. Next ICM remote transmission scheduled 09/07/2021.    

## 2021-08-07 NOTE — Progress Notes (Signed)
EPIC Encounter for ICM Monitoring  Patient Name: [facility] is a 70 y.o. male Date: 08/07/2021 Primary Care Physican: [name], MD Primary Cardiologist: Smith/Bensimhon Electrophysiologist: Allred Bi-V Pacing: 94.6%    07/15/2021 Office Weight: 319 lbs (does not weigh at home)  Time in AT/AF <0.1 hr/day (<0.1%)            Attempted call to patient and unable to reach.  Left detailed message per DPR regarding transmission. Transmission reviewed.   Cardioversion completed 8/30.                                   Optivol thoracic impedance suggesting fluid levels returned to normal after having cardioversion completed 8/30.     Prescribed: Furosemide 40 mg take 2 tablets (80 mg total) twice a day.  Potassium 20 mEq take 1 tablet daily   Labs: 05/04/2021 Creatinine 1.25, BUN 20, Potassium 4.4, Sodium 136, GFR 63 01/07/2021 Creatinine 1.20, BUN 17, Potassium 3.7, Sodium 139, GFR >60 A complete set of results can be found in Results Review.   Recommendations: Left voice mail with ICM number and encouraged to call if experiencing any fluid symptoms.   Follow-up plan: ICM clinic phone appointment on 19/26/2022.   91 day device clinic remote transmission 10/21/2021.     EP/Cardiology Office Visits:    Recall for 01/14/2021 with Dr [name].  09/24/2021 with Dr Haroldine Laws   Copy of ICM check sent to Dr. [name].    3 month ICM trend: 08/04/2021.    1 Year ICM trend:       Rosalene Billings, RN 08/07/2021 10:27 AM

## 2021-08-12 ENCOUNTER — Other Ambulatory Visit (HOSPITAL_COMMUNITY): Payer: Self-pay | Admitting: *Deleted

## 2021-08-12 ENCOUNTER — Encounter: Payer: Self-pay | Admitting: *Deleted

## 2021-08-12 ENCOUNTER — Telehealth (HOSPITAL_COMMUNITY): Payer: Self-pay | Admitting: Internal Medicine

## 2021-08-12 DIAGNOSIS — Z87891 Personal history of nicotine dependence: Secondary | ICD-10-CM

## 2021-08-12 MED ORDER — CARVEDILOL 12.5 MG PO TABS: 12.5000 mg | ORAL_TABLET | Freq: Two times a day (BID) | ORAL | 3 refills | Status: DC

## 2021-08-12 NOTE — Telephone Encounter (Signed)
Pt is completely out of carvedilol, please send script to [facility] on [address], Thanks

## 2021-08-17 ENCOUNTER — Ambulatory Visit (INDEPENDENT_AMBULATORY_CARE_PROVIDER_SITE_OTHER): Payer: 59

## 2021-08-17 ENCOUNTER — Other Ambulatory Visit: Payer: Self-pay

## 2021-08-17 DIAGNOSIS — Z950 Presence of cardiac pacemaker: Secondary | ICD-10-CM | POA: Diagnosis not present

## 2021-08-17 DIAGNOSIS — I5022 Chronic systolic (congestive) heart failure: Secondary | ICD-10-CM

## 2021-08-17 NOTE — Progress Notes (Signed)
Opened in error

## 2021-08-17 NOTE — Progress Notes (Signed)
EPIC Encounter for ICM Monitoring  Patient Name: Nicholas Foley is a 70 y.o. male Date: 08/17/2021 Primary Care Physican: [name], MD Primary Cardiologist: Smith/Bensimhon Electrophysiologist: Allred Bi-V Pacing: 95.6%    07/15/2021 Office Weight: 319 lbs (does not weigh at home)   Time in AT/AF  <0.1 hr/day (<0.1%)            Transmission reviewed.   Cardioversion completed 8/30.                                   Optivol thoracic impedance suggesting normal fluid levels.     Prescribed: Furosemide 40 mg take 2 tablets (80 mg total) twice a day.  Potassium 20 mEq take 1 tablet daily   Labs: 05/04/2021 Creatinine 1.25, BUN 20, Potassium 4.4, Sodium 136, GFR 63 01/07/2021 Creatinine 1.20, BUN 17, Potassium 3.7, Sodium 139, GFR >60 A complete set of results can be found in Results Review.   Recommendations:  No changes   Follow-up plan: ICM clinic phone appointment on 09/21/2021.   91 day device clinic remote transmission 10/21/2021.     EP/Cardiology Office Visits:    Recall for 01/14/2021 with Dr [name].  09/24/2021 with Dr Haroldine Laws   Copy of ICM check sent to Dr. [name].   3 month ICM trend: 08/16/2021.    1 Year ICM trend:       Rosalene Billings, RN 08/17/2021 4:54 PM

## 2021-08-19 ENCOUNTER — Other Ambulatory Visit: Payer: Self-pay | Admitting: Interventional Cardiology

## 2021-09-01 ENCOUNTER — Telehealth: Payer: Self-pay

## 2021-09-01 NOTE — Telephone Encounter (Signed)
Returned call as requested by voice mail message. Patient sent remote transmission to check fluid levels.  He reports feeling like he's had fluid for the last couple of days but a little better today.  Clothes have been a little more tighter than usual.  He does not weigh at home.  He thinks by tomorrow he will feel better since the report is suggesting fluid levels are back to normal today.  Will call if symptoms recur.      09/01/2021 Optivol thoracic impedance suggesting possible fluid accumulation intermittently since 9/30 but back at baseline today, 10/11.

## 2021-09-22 ENCOUNTER — Ambulatory Visit (INDEPENDENT_AMBULATORY_CARE_PROVIDER_SITE_OTHER): Payer: 59

## 2021-09-22 DIAGNOSIS — Z950 Presence of cardiac pacemaker: Secondary | ICD-10-CM | POA: Diagnosis not present

## 2021-09-22 DIAGNOSIS — I5022 Chronic systolic (congestive) heart failure: Secondary | ICD-10-CM | POA: Diagnosis not present

## 2021-09-22 NOTE — Progress Notes (Signed)
EPIC Encounter for ICM Monitoring  Patient Name: Nicholas Foley is a 70 y.o. male Date: 09/22/2021 Primary Care Physican: [name], MD (Inactive) Primary Cardiologist: Smith/Bensimhon Electrophysiologist: Allred Bi-V Pacing: 96.1%    07/15/2021 Office Weight: 319 lbs (does not weigh at home)   Time in AT/AF  <0.1 hr/day (<0.1%)            Spoke with patient and heart failure questions reviewed.  Pt asymptomatic for fluid accumulation and feeling well.                             Optivol thoracic impedance suggesting fluid levels trending close to baseline.     Prescribed: Furosemide 40 mg take 2 tablets (80 mg total) twice a day.  Potassium 20 mEq take 1 tablet daily   Labs: 05/04/2021 Creatinine 1.25, BUN 20, Potassium 4.4, Sodium 136, GFR 63 01/07/2021 Creatinine 1.20, BUN 17, Potassium 3.7, Sodium 139, GFR >60 A complete set of results can be found in Results Review.   Recommendations:  No changes and encouraged to call if experiencing any fluid symptoms.   Follow-up plan: ICM clinic phone appointment on 10/26/2021.   91 day device clinic remote transmission 10/21/2021.     EP/Cardiology Office Visits:    Recall for 01/14/2021 with Dr [name].  09/24/2021 with Dr Haroldine Laws   Copy of ICM check sent to Dr. [name].    3 month ICM trend: 09/20/2021.    1 Year ICM trend:       Rosalene Billings, RN 09/22/2021 1:28 PM

## 2021-09-24 ENCOUNTER — Ambulatory Visit (HOSPITAL_COMMUNITY)
Admission: RE | Admit: 2021-09-24 | Discharge: 2021-09-24 | Disposition: A | Discharge status: Home / Self Care | Payer: 59 | Source: Ambulatory Visit | Attending: Internal Medicine | Admitting: Internal Medicine

## 2021-09-24 ENCOUNTER — Other Ambulatory Visit: Payer: Self-pay

## 2021-09-24 VITALS — BP 148/66 | HR 85 | Ht 71.0 in | Wt 319.2 lb

## 2021-09-24 DIAGNOSIS — Z7901 Long term (current) use of anticoagulants: Secondary | ICD-10-CM | POA: Diagnosis not present

## 2021-09-24 DIAGNOSIS — I447 Left bundle-branch block, unspecified: Secondary | ICD-10-CM | POA: Insufficient documentation

## 2021-09-24 DIAGNOSIS — N1831 Chronic kidney disease, stage 3a: Secondary | ICD-10-CM | POA: Insufficient documentation

## 2021-09-24 DIAGNOSIS — Z7984 Long term (current) use of oral hypoglycemic drugs: Secondary | ICD-10-CM | POA: Diagnosis not present

## 2021-09-24 DIAGNOSIS — I130 Hypertensive heart and chronic kidney disease with heart failure and stage 1 through stage 4 chronic kidney disease, or unspecified chronic kidney disease: Secondary | ICD-10-CM | POA: Diagnosis not present

## 2021-09-24 DIAGNOSIS — I428 Other cardiomyopathies: Secondary | ICD-10-CM | POA: Insufficient documentation

## 2021-09-24 DIAGNOSIS — I5022 Chronic systolic (congestive) heart failure: Secondary | ICD-10-CM | POA: Diagnosis not present

## 2021-09-24 DIAGNOSIS — Z7951 Long term (current) use of inhaled steroids: Secondary | ICD-10-CM | POA: Insufficient documentation

## 2021-09-24 DIAGNOSIS — J449 Chronic obstructive pulmonary disease, unspecified: Secondary | ICD-10-CM | POA: Diagnosis not present

## 2021-09-24 DIAGNOSIS — I5042 Chronic combined systolic (congestive) and diastolic (congestive) heart failure: Secondary | ICD-10-CM | POA: Diagnosis not present

## 2021-09-24 DIAGNOSIS — I10 Essential (primary) hypertension: Secondary | ICD-10-CM

## 2021-09-24 DIAGNOSIS — I480 Paroxysmal atrial fibrillation: Secondary | ICD-10-CM

## 2021-09-24 DIAGNOSIS — Z950 Presence of cardiac pacemaker: Secondary | ICD-10-CM

## 2021-09-24 DIAGNOSIS — Z6841 Body Mass Index (BMI) 40.0 and over, adult: Secondary | ICD-10-CM | POA: Diagnosis not present

## 2021-09-24 DIAGNOSIS — G4733 Obstructive sleep apnea (adult) (pediatric): Secondary | ICD-10-CM | POA: Insufficient documentation

## 2021-09-24 DIAGNOSIS — I2510 Atherosclerotic heart disease of native coronary artery without angina pectoris: Secondary | ICD-10-CM

## 2021-09-24 DIAGNOSIS — Z87891 Personal history of nicotine dependence: Secondary | ICD-10-CM | POA: Diagnosis not present

## 2021-09-24 NOTE — Patient Instructions (Signed)
Good to see you today  No medication changes were made  Your physician has requested that you have an echocardiogram. Echocardiography is a painless test that uses sound waves to create images of your heart. It provides your doctor with information about the size and shape of your heart and how well your heart's chambers and valves are working. This procedure takes approximately one hour. There are no restrictions for this procedure.  Your physician recommends that you schedule a follow-up appointment in: 1 year Call office in beginning of year for May Appointment  If you have any questions or concerns before your next appointment please send [place] a message through [place] or call our office at [phone].    TO LEAVE A MESSAGE FOR THE NURSE SELECT OPTION 2, PLEASE LEAVE A MESSAGE INCLUDING: YOUR NAME DATE OF BIRTH CALL BACK NUMBER REASON FOR CALL**this is important as we prioritize the call backs  YOU WILL RECEIVE A CALL BACK THE SAME DAY AS LONG AS YOU CALL BEFORE 4:00 PM  At the [facility], you and your health needs are our priority. As part of our continuing mission to provide you with exceptional heart care, we have created designated Provider Care Teams. These Care Teams include your primary Cardiologist (physician) and Advanced Practice Providers (APPs- Physician Assistants and Nurse Practitioners) who all work together to provide you with the care you need, when you need it.   You may see any of the following providers on your designated Care Team at your next follow up: Dr Glori Bickers Dr Haynes Kerns, NP Lyda Jester, [place] [facility] [place], [place] Audry Riles, PharmD   Please be sure to bring in all your medications bottles to every appointment.

## 2021-09-24 NOTE — Progress Notes (Addendum)
Advanced Heart Failure Clinic Note   Date:  09/24/2021   ID:  Nicholas Foley, DOB 1950-12-29, MRN [id number]  Location: Home  Provider location: [facility] Type of Visit: Established patient  PCP:  [name], MD (Inactive)  Cardiologist:  [name], MD Primary HF: Larrissa Stivers  Chief Complaint: Heart Failure follow-up    History of Present Illness:  [name] is a 70 year old with morbid obesity, chronic systolic heart failure, COPD, OSA, PAF, previously on amio-> stopped 2018,  HTN, LBBB, Medtronic CRT-P, and former smoker.   He has h/o NICM which seems to date back to 2014. Had cardiac cath in 1/20 with no CAD and well compensated filling pressures.    Admitted in 1/20 for CRT-P device in setting of LBBB. Intially he felt a lot better but has gradually declined.   Echo EF 2/22 45% (in some images 45-50%) Limited by septal paradox. Personally reviewed Echo 01/30/20 EF 40-45% (read as 35-40%)  Got COVID again in 1/22 but was a mild case.   Had recurrent AF. Underwent DC-CV 8/22   Now working with personal trainer 2x/week at [employer]. Feels so much better. Much more active. Feels like he has recovered completely from [place]. No CP, edema, orthopnea or PND.   ICD: No further AF since DC-CV. No VT. Fluid looks good. Biv pacing 100% activity level 4 hr/day Personally reviewed    Cardiac Studies CPX 09/18/19  Limited by obesity and mild heart failure Peak VO2 15.1  (80% predicted peak VO2) - when corrected to ibw pVO2 27.5 [place] 33 RER 1.02    LHC/RHC 11/2018 No coronary disease.  RA 13 PA 37/20 (27)  PCWP 14 CO 8.5 CI 3.3    ECHO 09/27/2019 -RV normal EF 45-50%  ECHO 09/2018 -EF 35-40% Grade II DD      Past Medical History:  Diagnosis Date   Atrial fibrillation with rapid ventricular response ([place]) 08/02/2013   CHF (congestive heart failure) (HCC)    Chronic systolic dysfunction of left ventricle 08/02/2013   BNP greater  than 2000    COPD (chronic obstructive pulmonary disease) (HCC)    Moderate by PFTs with response to bronchodilators, May 2014   Erectile dysfunction    Hypertension    Insomnia 08/02/13   Left bundle branch block 08/02/2013   Morbid obesity ([place]) 08/02/2013   OSA (obstructive sleep apnea)    severe OSA with AHI 74/hr now on CPAP at 8cm H2O   Past Surgical History:  Procedure Laterality Date   BIV PACEMAKER INSERTION CRT-P N/A 12/14/2018   Medtronic model [id number] Percepta Quad CRT-P MRI Surescan (serial Number [id number]) device implanted by Dr [name] for CHF and LBBB   CARDIOVERSION N/A 08/06/2013   Procedure: CARDIOVERSION;  Surgeon: Lelon Perla, MD;  Location[facility] ENDOSCOPY;  Service: Cardiovascular;  Laterality: N/A;  spoke with [name]    CARDIOVERSION N/A 09/03/2013   Procedure: CARDIOVERSION;  Surgeon: [name], MD;  Location: [place];  Service: Cardiovascular;  Laterality: N/A;   CARDIOVERSION N/A 07/21/2021   Procedure: CARDIOVERSION;  Surgeon: Larey Dresser, MD;  Location: [facility] ENDOSCOPY;  Service: Cardiovascular;  Laterality: N/A;   chronic systolic dysfu     NASAL SEPTUM SURGERY     RIGHT/LEFT HEART CATH AND CORONARY ANGIOGRAPHY N/A 12/04/2018   Procedure: RIGHT/LEFT HEART CATH AND CORONARY ANGIOGRAPHY;  Surgeon: Belva Crome, MD;  Location: [place] CV LAB;  Service: Cardiovascular;  Laterality: N/A;   TEE WITHOUT CARDIOVERSION N/A 08/06/2013   Procedure: TRANSESOPHAGEAL ECHOCARDIOGRAM (TEE);  Surgeon: Lelon Perla, MD;  Location: [facility] ENDOSCOPY;  Service: Cardiovascular;  Laterality: N/A;   VASECTOMY       Current Outpatient Medications  Medication Sig Dispense Refill   acetaminophen (TYLENOL) 650 MG CR tablet Take 1,300 mg by mouth every 8 (eight) hours as needed for pain.      ADVAIR DISKUS 250-50 MCG/DOSE AEPB Inhale 1 puff into the lungs 2 (two) times daily.     apixaban (ELIQUIS) 5 MG TABS tablet Take 1 tablet (5 mg total) by mouth 2 (two) times  daily. 180 tablet 2   carvedilol (COREG) 12.5 MG tablet Take 1 tablet (12.5 mg total) by mouth 2 (two) times daily with a meal. 60 tablet 3   cholecalciferol (VITAMIN D3) 25 MCG (1000 UT) tablet Take 1,000 Units by mouth daily.     CIALIS 20 MG tablet Take 20 mg by mouth daily as needed for erectile dysfunction.   0   cyclobenzaprine (FLEXERIL) 10 MG tablet Take 10 mg by mouth 3 (three) times daily.     empagliflozin (JARDIANCE) 10 MG TABS tablet Take 1 tablet (10 mg total) by mouth daily before breakfast. 90 tablet 3   fluticasone (FLONASE) 50 MCG/ACT nasal spray Place 1 spray into both nostrils 2 (two) times daily.   0   furosemide (LASIX) 40 MG tablet TAKE 2 TABLETS(80 MG) BY MOUTH TWICE DAILY 360 tablet 1   Multiple Vitamins-Minerals (PRESERVISION AREDS PO) Take 2 capsules by mouth daily in the afternoon.     omeprazole (PRILOSEC OTC) 20 MG tablet Take 20 mg by mouth daily.     potassium chloride SA (KLOR-CON) 20 MEQ tablet Take 1 tablet (20 mEq total) by mouth daily. 90 tablet 3   sacubitril-valsartan (ENTRESTO) 97-103 MG Take 1 tablet by mouth 2 (two) times daily. 180 tablet 3   spironolactone (ALDACTONE) 25 MG tablet Take 1 tablet (25 mg total) by mouth daily. 90 tablet 1   vitamin B-12 (CYANOCOBALAMIN) 1000 MCG tablet Take 1,000 mcg by mouth daily.     zolpidem (AMBIEN CR) 12.5 MG CR tablet Take 1 tablet by mouth at bedtime.     No current facility-administered medications for this encounter.    Allergies:   Patient has no known allergies.   Social History:  The patient  reports that he quit smoking about 9 years ago. His smoking use included cigarettes and cigars. He has a 64.50 pack-year smoking history. He has never used smokeless tobacco. He reports current alcohol use. He reports that he does not use drugs.   Family History:  The patient's family history includes Atrial fibrillation in his mother; COPD in his father; Cerebral aneurysm in his brother; Healthy in his daughter.    ROS:  Please see the history of present illness.   All other systems are personally reviewed and negative.   Vitals:   09/24/21 1415  BP: (!) 148/66  Pulse: 85  SpO2: 95%     PHYSICAL EXAM: General:  Well appearing. No resp difficulty HEENT: normal Neck: supple. no JVD. Carotids 2+ bilat; no bruits. No lymphadenopathy or thryomegaly appreciated. Cor: PMI nondisplaced. Regular rate & rhythm. No rubs, gallops or murmurs. Lungs: clear Abdomen: obese soft, nontender, nondistended. No hepatosplenomegaly. No bruits or masses. Good bowel sounds. Extremities: no cyanosis, clubbing, rash, edema Neuro: alert & orientedx3, cranial nerves grossly intact. moves all 4 extremities w/o difficulty. Affect pleasant  Recent Labs: 01/07/2021: B Natriuretic Peptide 103.7 07/15/2021: BUN 17; Creatinine, Ser 1.08; Hemoglobin 14.6; Platelets 243; Potassium 3.7; Sodium 138  Personally reviewed   Wt Readings from Last 3 Encounters:  07/28/21 (!) 144.8 kg (319 lb 3.2 oz)  07/21/21 (!) 147.4 kg (325 lb)  07/15/21 (!) 144.9 kg (319 lb 6.4 oz)      ASSESSMENT AND PLAN:  1. Chronic Combined Systolic/Diastolic HF - NICM (thought due to LBBB). 12/2018 LHC no coronary disease.  - s/p Medtronic CRT-P - ECHO 11/2017 EF 35-40%. ECHO 11/19 EF 45-50%.  - Echo 11/20 read as 35-40% but with Definity I felt closed to 45% - Echo 3/21 EF read as 35-40% I think 40-45% - Echo EF 2/22 45% (in some images 45-50%) Limited by septal paradox. Personally reviewed - CPX test 10/20 suboptimal effort mostly limited to obesity  - ICD interrogation done personally. Results as above - Now much improved with going to [employer] program - BP high here but has been well controlled otherwise - NYHA II  - Continue Lasix 80 bid - Continue carvedilol to 12.5 mg twice a day - Continue Entresto 97-103 mg twice a day.  - Continue spiro 25 mg daily. - Continue Jadiance 10 mg daily - Recent labwork with PCP in 10/22 reviewed K 4.0 Scr  1.3 Hgba1c 5.6%   2. PAF  - In the past he was on amiodarone. Stopped ~2017 - Had recurrent AF. Underwent DC-CV 8/22. Followed by AF Clinic  - No AF on device today - Continue Eliquis 5 bid. No bleeding   3. OSA - Continue CPAP   4. Obesity -  Body mass index is 44.52 kg/m.  -  Continue weight loss efforts. Down almost 10 pounds  5. COPD  - no longer smoking  6. CKD 3a - Continue Jardiance 10 - Last SCr 1.3   Glori Bickers, MD  09/24/2021 11:49 AM  Advanced Heart Failure Clinic [facility] [place] [id number] [phone] (office) [phone] (fax)

## 2021-09-24 NOTE — Addendum Note (Signed)
Encounter addended by: Stanford Scotland, RN on: 09/24/2021 3:07 PM  Actions taken: Visit diagnoses modified, Order list changed, Diagnosis association updated, Clinical Note Signed

## 2021-10-08 ENCOUNTER — Encounter (INDEPENDENT_AMBULATORY_CARE_PROVIDER_SITE_OTHER): Payer: 59 | Admitting: Ophthalmology

## 2021-10-08 ENCOUNTER — Other Ambulatory Visit: Payer: Self-pay

## 2021-10-08 DIAGNOSIS — H35033 Hypertensive retinopathy, bilateral: Secondary | ICD-10-CM | POA: Diagnosis not present

## 2021-10-08 DIAGNOSIS — H353122 Nonexudative age-related macular degeneration, left eye, intermediate dry stage: Secondary | ICD-10-CM | POA: Diagnosis not present

## 2021-10-08 DIAGNOSIS — H43813 Vitreous degeneration, bilateral: Secondary | ICD-10-CM

## 2021-10-08 DIAGNOSIS — D1809 Hemangioma of other sites: Secondary | ICD-10-CM

## 2021-10-08 DIAGNOSIS — I10 Essential (primary) hypertension: Secondary | ICD-10-CM

## 2021-10-21 ENCOUNTER — Ambulatory Visit (INDEPENDENT_AMBULATORY_CARE_PROVIDER_SITE_OTHER): Payer: 59

## 2021-10-21 DIAGNOSIS — I428 Other cardiomyopathies: Secondary | ICD-10-CM

## 2021-10-21 LAB — CUP PACEART REMOTE DEVICE CHECK
Battery Remaining Longevity: 96 mo
Battery Voltage: 2.99 V
Brady Statistic AP VP Percent: 0.24 %
Brady Statistic AP VS Percent: 0.02 %
Brady Statistic AS VP Percent: 95.48 %
Brady Statistic AS VS Percent: 4.27 %
Brady Statistic RA Percent Paced: 0.33 %
Brady Statistic RV Percent Paced: 36.65 %
Date Time Interrogation Session: 20221129213558
Implantable Lead Implant Date: 20200123
Implantable Lead Implant Date: 20200123
Implantable Lead Implant Date: 20200123
Implantable Lead Location: 753858
Implantable Lead Location: 753859
Implantable Lead Location: 753860
Implantable Lead Model: 4598
Implantable Lead Model: 5076
Implantable Lead Model: 5076
Implantable Pulse Generator Implant Date: 20200123
Lead Channel Impedance Value: 1007 Ohm
Lead Channel Impedance Value: 1102 Ohm
Lead Channel Impedance Value: 1102 Ohm
Lead Channel Impedance Value: 1292 Ohm
Lead Channel Impedance Value: 1501 Ohm
Lead Channel Impedance Value: 1520 Ohm
Lead Channel Impedance Value: 380 Ohm
Lead Channel Impedance Value: 437 Ohm
Lead Channel Impedance Value: 456 Ohm
Lead Channel Impedance Value: 551 Ohm
Lead Channel Impedance Value: 570 Ohm
Lead Channel Impedance Value: 760 Ohm
Lead Channel Impedance Value: 760 Ohm
Lead Channel Impedance Value: 931 Ohm
Lead Channel Pacing Threshold Amplitude: 0.5 V
Lead Channel Pacing Threshold Amplitude: 0.875 V
Lead Channel Pacing Threshold Amplitude: 2.125 V
Lead Channel Pacing Threshold Pulse Width: 0.4 ms
Lead Channel Pacing Threshold Pulse Width: 0.4 ms
Lead Channel Pacing Threshold Pulse Width: 0.5 ms
Lead Channel Sensing Intrinsic Amplitude: 1.125 mV
Lead Channel Sensing Intrinsic Amplitude: 1.125 mV
Lead Channel Sensing Intrinsic Amplitude: 7.625 mV
Lead Channel Sensing Intrinsic Amplitude: 7.625 mV
Lead Channel Setting Pacing Amplitude: 2 V
Lead Channel Setting Pacing Amplitude: 2.5 V
Lead Channel Setting Pacing Amplitude: 3.25 V
Lead Channel Setting Pacing Pulse Width: 0.4 ms
Lead Channel Setting Pacing Pulse Width: 0.5 ms
Lead Channel Setting Sensing Sensitivity: 2 mV

## 2021-10-26 ENCOUNTER — Telehealth: Payer: Self-pay

## 2021-10-26 ENCOUNTER — Ambulatory Visit (INDEPENDENT_AMBULATORY_CARE_PROVIDER_SITE_OTHER): Payer: 59

## 2021-10-26 DIAGNOSIS — Z950 Presence of cardiac pacemaker: Secondary | ICD-10-CM

## 2021-10-26 DIAGNOSIS — I5022 Chronic systolic (congestive) heart failure: Secondary | ICD-10-CM

## 2021-10-26 NOTE — Progress Notes (Signed)
EPIC Encounter for ICM Monitoring  Patient Name: Nicholas Foley is a 70 y.o. male Date: 10/26/2021 Primary Care Physican: [name], MD (Inactive) Primary Cardiologist: Smith/Bensimhon Electrophysiologist: Allred Bi-V Pacing: 97.1%    09/24/2021 Office Weight: 319 lbs (does not weigh at home)   Time in AT/AF  <0.1 hr/day (<0.1%) VT (>4 beats) 2            Attempted call to patient and unable to reach.  Left detailed message per DPR regarding transmission. Transmission reviewed.                 Optivol thoracic impedance suggesting possible fluid accumulation starting 11/29.     Prescribed: Furosemide 40 mg take 2 tablets (80 mg total) twice a day.  Potassium 20 mEq take 1 tablet daily   Labs: 05/04/2021 Creatinine 1.25, BUN 20, Potassium 4.4, Sodium 136, GFR 63 01/07/2021 Creatinine 1.20, BUN 17, Potassium 3.7, Sodium 139, GFR >60 A complete set of results can be found in Results Review.   Recommendations:  Left voice mail with ICM number and encouraged to call if experiencing any fluid symptoms.   Follow-up plan: ICM clinic phone appointment on 11/02/2021 to recheck fluid levels.   91 day device clinic remote transmission 01/20/2022.     EP/Cardiology Office Visits:    Recall for 01/14/2021 with Dr [name].  Recall 09/24/2022 with Dr Haroldine Laws.  Recall 02/22/2022 with Dr [name].   Copy of ICM check sent to Dr. [name].  Will send copy to Dr Haroldine Laws for review if patient is reached.     3 month ICM trend: 10/26/2021.    12-14 Month ICM trend:       Rosalene Billings, RN 10/26/2021 12:12 PM

## 2021-10-26 NOTE — Telephone Encounter (Signed)
Remote ICM transmission received.  Attempted call to patient regarding ICM remote transmission and left detailed message per DPR.  Advised to return call for any fluid symptoms or questions. Next ICM remote transmission scheduled 11/02/2021.

## 2021-10-28 NOTE — Progress Notes (Signed)
Patient returned call and reports he is having SOB and not feeling well.  He can tell he has some fluid and did take an extra Furosemide on 12/6 (he self adjusts Furosemide).  He sent 12/7 report and reviewed with patient.  Advised the extra 80 Furosemide has improved the fluid levels and is now close to baseline.  Advised to take 0.5 tablet (40 mg) if he is still feeling symptoms which should return him to baseline.  He is feeling better today compared to to the last 2 days.  Will repeat fluid level check 12/12.   10/28/2021 Optivol thoracic impedance suggesting fluid levels returning close to baseline.

## 2021-10-30 NOTE — Progress Notes (Signed)
Remote pacemaker transmission.   

## 2021-11-02 ENCOUNTER — Ambulatory Visit (INDEPENDENT_AMBULATORY_CARE_PROVIDER_SITE_OTHER): Payer: 59

## 2021-11-02 DIAGNOSIS — I5022 Chronic systolic (congestive) heart failure: Secondary | ICD-10-CM

## 2021-11-02 DIAGNOSIS — Z950 Presence of cardiac pacemaker: Secondary | ICD-10-CM

## 2021-11-03 NOTE — Progress Notes (Signed)
EPIC Encounter for ICM Monitoring  Patient Name: Nicholas Foley is a 70 y.o. male Date: 11/03/2021 Primary Care Physican: [name], MD (Inactive) Primary Cardiologist: Smith/Bensimhon Electrophysiologist: Allred Bi-V Pacing: 95.7%    09/24/2021 Office Weight: 319 lbs (does not weigh at home)   Time in AT/AF  <0.1 hr/day (<0.1%) VT (>4 beats)    1           Spoke with patient and heart failure questions reviewed.  Pt fluid symptoms have resolved.  He does not weigh at home but clothes are loose fitting after taking extra Furosemide.         Optivol thoracic impedance suggesting fluid levels returned to normal 12/11 after taking extra Furosemide.     Prescribed: Furosemide 40 mg take 2 tablets (80 mg total) twice a day.  Potassium 20 mEq take 1 tablet daily   Labs: 05/04/2021 Creatinine 1.25, BUN 20, Potassium 4.4, Sodium 136, GFR 63 01/07/2021 Creatinine 1.20, BUN 17, Potassium 3.7, Sodium 139, GFR >60 A complete set of results can be found in Results Review.   Recommendations:  No changes and encouraged to call if experiencing any fluid symptoms.   Follow-up plan: ICM clinic phone appointment on 11/30/2021.   91 day device clinic remote transmission 01/20/2022.     EP/Cardiology Office Visits:    Recall for 01/14/2021 with Dr [name].  Recall 09/24/2022 with Dr Haroldine Laws.  Recall 02/22/2022 with Dr [name].   Copy of ICM check sent to Dr. [name].      3 month ICM trend: 11/01/2021.    12-14 Month ICM trend:       Rosalene Billings, RN 11/03/2021 10:29 AM

## 2021-11-30 ENCOUNTER — Ambulatory Visit (INDEPENDENT_AMBULATORY_CARE_PROVIDER_SITE_OTHER): Payer: 59

## 2021-11-30 DIAGNOSIS — I5022 Chronic systolic (congestive) heart failure: Secondary | ICD-10-CM | POA: Diagnosis not present

## 2021-11-30 DIAGNOSIS — Z950 Presence of cardiac pacemaker: Secondary | ICD-10-CM | POA: Diagnosis not present

## 2021-12-02 NOTE — Progress Notes (Signed)
EPIC Encounter for ICM Monitoring  Patient Name: Nicholas Foley is a 71 y.o. male Date: 12/02/2021 Primary Care Physican: [name], MD Primary Cardiologist: Smith/Bensimhon Electrophysiologist: Allred Bi-V Pacing: 89.1%    09/24/2021 Office Weight: 319 lbs (does not weigh at home)   Time in AT/AF  <0.1 hr/day (<0.1%) VT (>4 beats)    6           Spoke with patient and heart failure questions reviewed. Pt feeling well at this time.        Optivol thoracic impedance normal but was suggesting intermittent days with possible fluid accumulation since 10/26/2021.     Prescribed: Furosemide 40 mg take 2 tablets (80 mg total) twice a day.  Potassium 20 mEq take 1 tablet daily   Labs: 05/04/2021 Creatinine 1.25, BUN 20, Potassium 4.4, Sodium 136, GFR 63 01/07/2021 Creatinine 1.20, BUN 17, Potassium 3.7, Sodium 139, GFR >60 A complete set of results can be found in Results Review.   Recommendations:  No changes and encouraged to call if experiencing any fluid symptoms.   Follow-up plan: ICM clinic phone appointment on 01/05/2022.   91 day device clinic remote transmission 01/20/2022.     EP/Cardiology Office Visits:    Recall for 01/14/2021 with Dr [name].  Recall 09/24/2022 with Dr Haroldine Laws.  Recall 02/22/2022 with Dr [name].   Copy of ICM check sent to Dr. [name].      3 month ICM trend: 11/29/2021.    12-14 Month ICM trend:     Rosalene Billings, RN 12/02/2021 1:56 PM

## 2021-12-09 ENCOUNTER — Other Ambulatory Visit: Payer: Self-pay | Admitting: Internal Medicine

## 2021-12-14 ENCOUNTER — Other Ambulatory Visit (HOSPITAL_COMMUNITY): Payer: Self-pay | Admitting: Internal Medicine

## 2021-12-22 ENCOUNTER — Telehealth: Payer: Self-pay

## 2021-12-22 NOTE — Telephone Encounter (Signed)
Spoke with patient and he reports he is not able to get his Medtronic phone app to work with his device.  Provided Medtronic tech support for assistance and to call when he gets a chance. Advised I am not able to help with the app.

## 2021-12-30 ENCOUNTER — Telehealth (HOSPITAL_COMMUNITY): Payer: Self-pay | Admitting: Surgery

## 2021-12-30 ENCOUNTER — Other Ambulatory Visit (HOSPITAL_COMMUNITY): Payer: Self-pay

## 2021-12-30 MED ORDER — EMPAGLIFLOZIN 10 MG PO TABS: 10.0000 mg | ORAL_TABLET | Freq: Every day | ORAL | 1 refills | Status: DC

## 2021-12-30 MED ORDER — EMPAGLIFLOZIN 10 MG PO TABS: 10.0000 mg | ORAL_TABLET | Freq: Every day | ORAL | 3 refills | Status: DC

## 2021-12-30 NOTE — Telephone Encounter (Signed)
Patient called to request refill for Jardiance.  Refill sent to pharmacy of choice.

## 2022-01-05 ENCOUNTER — Ambulatory Visit (INDEPENDENT_AMBULATORY_CARE_PROVIDER_SITE_OTHER): Payer: 59

## 2022-01-05 DIAGNOSIS — Z950 Presence of cardiac pacemaker: Secondary | ICD-10-CM | POA: Diagnosis not present

## 2022-01-05 DIAGNOSIS — I5022 Chronic systolic (congestive) heart failure: Secondary | ICD-10-CM

## 2022-01-07 ENCOUNTER — Telehealth: Payer: Self-pay

## 2022-01-07 NOTE — Telephone Encounter (Signed)
Remote ICM transmission received.  Attempted call to patient regarding ICM remote transmission and left detailed message per DPR.  Advised to return call for any fluid symptoms or questions. Next ICM remote transmission scheduled 02/08/2022.   ° ° °

## 2022-01-07 NOTE — Progress Notes (Signed)
EPIC Encounter for ICM Monitoring  Patient Name: Nicholas Foley is a 71 y.o. male Date: 01/07/2022 Primary Care Physican: [name], MD Primary Cardiologist: Smith/Bensimhon Electrophysiologist: Allred Bi-V Pacing: 89.1%    09/24/2021 Office Weight: 319 lbs (does not weigh at home)   Since 29-Nov-2021 Monitored VT (>4 beats) 4 Fast A&V      3 VT (>4 beats)  6           Attempted call to patient and unable to reach.  Left detailed message per DPR regarding transmission. Transmission reviewed.         Optivol thoracic impedance normal but was suggesting intermittent days with possible fluid accumulation since 10/26/2021.     Prescribed: Furosemide 40 mg take 2 tablets (80 mg total) twice a day.  Potassium 20 mEq take 1 tablet daily   Labs: 05/04/2021 Creatinine 1.25, BUN 20, Potassium 4.4, Sodium 136, GFR 63 01/07/2021 Creatinine 1.20, BUN 17, Potassium 3.7, Sodium 139, GFR >60 A complete set of results can be found in Results Review.   Recommendations:  Left voice mail with ICM number and encouraged to call if experiencing any fluid symptoms.   Follow-up plan: ICM clinic phone appointment on 02/08/2022.   91 day device clinic remote transmission 01/20/2022.     EP/Cardiology Office Visits:    Recall for 01/14/2021 with Dr [name].  03/29/2022 with Dr Haroldine Laws.  Recall 02/22/2022 with Dr [name].   Copy of ICM check sent to Dr. [name].      3 month ICM trend: 01/05/2022.    12-14 Month ICM trend:     Rosalene Billings, RN 01/07/2022 11:53 AM

## 2022-01-20 ENCOUNTER — Ambulatory Visit (INDEPENDENT_AMBULATORY_CARE_PROVIDER_SITE_OTHER): Payer: 59

## 2022-01-20 DIAGNOSIS — I428 Other cardiomyopathies: Secondary | ICD-10-CM

## 2022-01-20 DIAGNOSIS — I5022 Chronic systolic (congestive) heart failure: Secondary | ICD-10-CM

## 2022-01-20 LAB — CUP PACEART REMOTE DEVICE CHECK
Battery Remaining Longevity: 92 mo
Battery Voltage: 2.99 V
Brady Statistic AP VP Percent: 0.18 %
Brady Statistic AP VS Percent: 0.03 %
Brady Statistic AS VP Percent: 89.13 %
Brady Statistic AS VS Percent: 10.67 %
Brady Statistic RA Percent Paced: 0.22 %
Brady Statistic RV Percent Paced: 36.8 %
Date Time Interrogation Session: 20230228224817
Implantable Lead Implant Date: 20200123
Implantable Lead Implant Date: 20200123
Implantable Lead Implant Date: 20200123
Implantable Lead Location: 753858
Implantable Lead Location: 753859
Implantable Lead Location: 753860
Implantable Lead Model: 4598
Implantable Lead Model: 5076
Implantable Lead Model: 5076
Implantable Pulse Generator Implant Date: 20200123
Lead Channel Impedance Value: 1045 Ohm
Lead Channel Impedance Value: 1102 Ohm
Lead Channel Impedance Value: 1216 Ohm
Lead Channel Impedance Value: 1368 Ohm
Lead Channel Impedance Value: 1444 Ohm
Lead Channel Impedance Value: 361 Ohm
Lead Channel Impedance Value: 418 Ohm
Lead Channel Impedance Value: 456 Ohm
Lead Channel Impedance Value: 551 Ohm
Lead Channel Impedance Value: 608 Ohm
Lead Channel Impedance Value: 684 Ohm
Lead Channel Impedance Value: 760 Ohm
Lead Channel Impedance Value: 836 Ohm
Lead Channel Impedance Value: 969 Ohm
Lead Channel Pacing Threshold Amplitude: 0.5 V
Lead Channel Pacing Threshold Amplitude: 1 V
Lead Channel Pacing Threshold Amplitude: 2 V
Lead Channel Pacing Threshold Pulse Width: 0.4 ms
Lead Channel Pacing Threshold Pulse Width: 0.4 ms
Lead Channel Pacing Threshold Pulse Width: 0.5 ms
Lead Channel Sensing Intrinsic Amplitude: 1 mV
Lead Channel Sensing Intrinsic Amplitude: 1 mV
Lead Channel Sensing Intrinsic Amplitude: 8.75 mV
Lead Channel Sensing Intrinsic Amplitude: 8.75 mV
Lead Channel Setting Pacing Amplitude: 2 V
Lead Channel Setting Pacing Amplitude: 2.5 V
Lead Channel Setting Pacing Amplitude: 3.25 V
Lead Channel Setting Pacing Pulse Width: 0.4 ms
Lead Channel Setting Pacing Pulse Width: 0.5 ms
Lead Channel Setting Sensing Sensitivity: 2 mV

## 2022-01-28 NOTE — Progress Notes (Signed)
Remote pacemaker transmission.   

## 2022-02-07 ENCOUNTER — Other Ambulatory Visit: Payer: Self-pay | Admitting: Internal Medicine

## 2022-02-08 ENCOUNTER — Ambulatory Visit (INDEPENDENT_AMBULATORY_CARE_PROVIDER_SITE_OTHER): Payer: 59

## 2022-02-08 DIAGNOSIS — Z950 Presence of cardiac pacemaker: Secondary | ICD-10-CM | POA: Diagnosis not present

## 2022-02-08 DIAGNOSIS — I5022 Chronic systolic (congestive) heart failure: Secondary | ICD-10-CM

## 2022-02-10 NOTE — Progress Notes (Signed)
EPIC Encounter for ICM Monitoring ? ?Patient Name: Nicholas Foley is a 71 y.o. male ?Date: 02/10/2022 ?Primary Care Physican: [name], MD ?Primary Cardiologist: Smith/Bensimhon ?Electrophysiologist: Allred ?Bi-V Pacing: 93.1%    ?09/24/2021 Office Weight: 319 lbs (does not weigh at home) ?  ?Since 19-Jan-2022 ?Monitored ?VT (>4 beats) 2 ?Time in AT/AF <0.1 hr/day (<0.1%) ?  ?  ?      Spoke with patient and heart failure questions reviewed.  Pt asymptomatic for fluid accumulation.  Reports feeling well at this time and voices no complaints.  ?       ?Optivol thoracic impedance normal but was suggesting normal fluid levels.   ?  ?Prescribed: ?Furosemide 40 mg take 2 tablets (80 mg total) twice a day.  ?Potassium 20 mEq take 1 tablet daily ?  ?Labs: ?05/04/2021 Creatinine 1.25, BUN 20, Potassium 4.4, Sodium 136, GFR 63 ?01/07/2021 Creatinine 1.20, BUN 17, Potassium 3.7, Sodium 139, GFR >60 ?A complete set of results can be found in Results Review. ?  ?Recommendations:  No changes and encouraged to call if experiencing any fluid symptoms. ?  ?Follow-up plan: ICM clinic phone appointment on 03/15/2022.   91 day device clinic remote transmission 04/21/2022.   ?  ?EP/Cardiology Office Visits:    Recall for 01/14/2021 with Dr [name].  03/29/2022 with Dr Haroldine Laws.  Recall 02/22/2022 with Dr [name]. ?  ?Copy of ICM check sent to Dr. [name].     ? ?3 month ICM trend: 02/07/2022. ? ? ? ?12-14 Month ICM trend:  ? ? ? ?Rosalene Billings, RN ?02/10/2022 ?3:07 PM ? ?

## 2022-02-22 ENCOUNTER — Other Ambulatory Visit: Payer: Self-pay | Admitting: Interventional Cardiology

## 2022-03-11 ENCOUNTER — Other Ambulatory Visit: Payer: Self-pay | Admitting: Family Medicine

## 2022-03-11 DIAGNOSIS — R339 Retention of urine, unspecified: Secondary | ICD-10-CM

## 2022-03-11 DIAGNOSIS — R3911 Hesitancy of micturition: Secondary | ICD-10-CM

## 2022-03-11 DIAGNOSIS — N1831 Chronic kidney disease, stage 3a: Secondary | ICD-10-CM

## 2022-03-15 ENCOUNTER — Ambulatory Visit (INDEPENDENT_AMBULATORY_CARE_PROVIDER_SITE_OTHER): Payer: 59

## 2022-03-15 DIAGNOSIS — I5022 Chronic systolic (congestive) heart failure: Secondary | ICD-10-CM

## 2022-03-15 DIAGNOSIS — Z950 Presence of cardiac pacemaker: Secondary | ICD-10-CM

## 2022-03-16 ENCOUNTER — Other Ambulatory Visit (HOSPITAL_COMMUNITY): Payer: Self-pay | Admitting: Internal Medicine

## 2022-03-17 ENCOUNTER — Ambulatory Visit
Admission: RE | Admit: 2022-03-17 | Discharge: 2022-03-17 | Disposition: A | Discharge status: Home / Self Care | Payer: 59 | Source: Ambulatory Visit | Attending: Family Medicine | Admitting: Family Medicine

## 2022-03-17 ENCOUNTER — Telehealth: Payer: Self-pay

## 2022-03-17 ENCOUNTER — Other Ambulatory Visit: Payer: Self-pay | Admitting: Interventional Cardiology

## 2022-03-17 DIAGNOSIS — R339 Retention of urine, unspecified: Secondary | ICD-10-CM

## 2022-03-17 DIAGNOSIS — N1831 Chronic kidney disease, stage 3a: Secondary | ICD-10-CM

## 2022-03-17 DIAGNOSIS — R3911 Hesitancy of micturition: Secondary | ICD-10-CM

## 2022-03-17 MED ORDER — FUROSEMIDE 40 MG PO TABS: 80.0000 mg | ORAL_TABLET | Freq: Two times a day (BID) | ORAL | 0 refills | Status: DC

## 2022-03-17 NOTE — Progress Notes (Signed)
EPIC Encounter for ICM Monitoring ? ?Patient Name: Nicholas Foley is a 71 y.o. male ?Date: 03/17/2022 ?Primary Care Physican: [name], MD ?Primary Cardiologist: Smith/Bensimhon ?Electrophysiologist: Allred ?Bi-V Pacing: 90.9%    ?03/17/2022 Weight: 300 lbs  ?  ?Since 07-Feb-2022 ?Monitored ?VT (>4 beats) 22 ?Fast A&V 5 ?AT/AF 0 ?Time in AT/AF <0.1 hr/day (<0.1%) ?  ?  ?      Spoke with patient and heart failure questions reviewed.  Pt reports difficulty urinating in the last week and was prescribed a medication which has effectively resolved the issue.  He reports this may have caused fluid accumulation but can tel that the fluid issue is resolving.  ?       ?Optivol thoracic impedance suggesting possible fluid accumulation starting 4/12 and returned to baseline 4/26. ?  ?Prescribed: ?Furosemide 40 mg take 2 tablets (80 mg total) twice a day.  ?Potassium 20 mEq take 1 tablet daily ?  ?Labs: ?05/04/2021 Creatinine 1.25, BUN 20, Potassium 4.4, Sodium 136, GFR 63 ?01/07/2021 Creatinine 1.20, BUN 17, Potassium 3.7, Sodium 139, GFR >60 ?A complete set of results can be found in Results Review. ?  ?Recommendations:  No changes and encouraged to call if experiencing any fluid symptoms. ?  ?Follow-up plan: ICM clinic phone appointment on 04/22/2022.   91 day device clinic remote transmission 04/21/2022.   ?  ?EP/Cardiology Office Visits:    Recall for 01/14/2021 with Dr [name].  03/29/2022 with Dr Haroldine Laws.  Recall 02/22/2022 with Dr [name]. ?  ?Copy of ICM check sent to Dr. [name].     ? ?3 month ICM trend: 03/17/2022. ? ? ? ?12-14 Month ICM trend:  ? ? ? ?Rosalene Billings, RN ?03/17/2022 ?4:04 PM ? ?

## 2022-03-17 NOTE — Telephone Encounter (Signed)
I spoke with the patient and he agreed to send missed ICM transmission. 

## 2022-03-18 NOTE — Progress Notes (Signed)
?Cardiology Office Note:   ? ?Date:  03/19/2022  ? ?ID:  Nicholas Foley, DOB 01-09-1951, MRN [id number] ? ?PCP:  [name], MD  ?Cardiologist:  [name], MD  ? ?Referring MD: [name], MD  ? ?Chief Complaint  ?Patient presents with  ? Congestive Heart Failure  ? Atrial Fibrillation  ? Hospitalization Follow-up  ?  Resynchronization ?PAF  ? ? ?History of Present Illness:   ? ?Nicholas Foley is a 71 y.o. male with a hx of morbid obesity, chronic systolic heart failure, COPD, OSA, PAF, previously on amio-> stopped 2018,  HTN, LBBB, Medtronic CRT-P, and former smoker. ? ?Last AHF clinic f/u with Dr. B in 09/2021. Felt stable at that time.. ? ?He feels great.  He has lost nearly 40 pounds over the past year.  He has a Clinical research associate at [employer].  He feels much better.  He has no chest pain.  He denies orthopnea and PND.  He denies angina. ? ?He wants instruction about who is attending EP doctor should be.  I reassured him that he will be assigned by the clinic to an appropriate physician. ? ?Past Medical History:  ?Diagnosis Date  ? Atrial fibrillation with rapid ventricular response ([place]) 08/02/2013  ? CHF (congestive heart failure) ([place])   ? Chronic systolic dysfunction of left ventricle 08/02/2013  ? BNP greater than 2000   ? COPD (chronic obstructive pulmonary disease) ([place])   ? Moderate by PFTs with response to bronchodilators, May 2014  ? Erectile dysfunction   ? Hypertension   ? Insomnia 08/02/13  ? Left bundle branch block 08/02/2013  ? Morbid obesity ([place]) 08/02/2013  ? OSA (obstructive sleep apnea)   ? severe OSA with AHI 74/hr now on CPAP at 8cm H2O  ? ? ?Past Surgical History:  ?Procedure Laterality Date  ? BIV PACEMAKER INSERTION CRT-P N/A 12/14/2018  ? Medtronic model [id number] Percepta Quad CRT-P MRI Rolan Bucco (serial Number [id number]) device implanted by Dr [name] for CHF and LBBB  ? CARDIOVERSION N/A 08/06/2013  ? Procedure: CARDIOVERSION;  Surgeon: Lelon Perla, MD;  Location: [facility] ENDOSCOPY;  Service:  Cardiovascular;  Laterality: N/A;  spoke with [name]   ? CARDIOVERSION N/A 09/03/2013  ? Procedure: CARDIOVERSION;  Surgeon: [name], MD;  Location: [place];  Service: Cardiovascular;  Laterality: N/A;  ? CARDIOVERSION N/A 07/21/2021  ? Procedure: CARDIOVERSION;  Surgeon: Larey Dresser, MD;  Location: [facility] ENDOSCOPY;  Service: Cardiovascular;  Laterality: N/A;  ? chronic systolic dysfu    ? NASAL SEPTUM SURGERY    ? RIGHT/LEFT HEART CATH AND CORONARY ANGIOGRAPHY N/A 12/04/2018  ? Procedure: RIGHT/LEFT HEART CATH AND CORONARY ANGIOGRAPHY;  Surgeon: Belva Crome, MD;  Location: [place] CV LAB;  Service: Cardiovascular;  Laterality: N/A;  ? TEE WITHOUT CARDIOVERSION N/A 08/06/2013  ? Procedure: TRANSESOPHAGEAL ECHOCARDIOGRAM (TEE);  Surgeon: Lelon Perla, MD;  Location: [facility] ENDOSCOPY;  Service: Cardiovascular;  Laterality: N/A;  ? VASECTOMY    ? ? ?Current Medications: ?Current Meds  ?Medication Sig  ? acetaminophen (TYLENOL) 650 MG CR tablet Take 1,300 mg by mouth in the morning and at bedtime. Arthritis strength  ? ADVAIR DISKUS 250-50 MCG/DOSE AEPB Inhale 1 puff into the lungs 2 (two) times daily.  ? carvedilol (COREG) 12.5 MG tablet TAKE 1 TABLET(12.5 MG) BY MOUTH TWICE DAILY WITH A MEAL  ? cholecalciferol (VITAMIN D3) 25 MCG (1000 UT) tablet Take 1,000 Units by mouth daily.  ? CIALIS 20 MG tablet  Take 20 mg by mouth daily as needed for erectile dysfunction.   ? cyclobenzaprine (FLEXERIL) 10 MG tablet Take 10 mg by mouth 3 (three) times daily.  ? ELIQUIS 5 MG TABS tablet TAKE 1 TABLET(5 MG) BY MOUTH TWICE DAILY  ? empagliflozin (JARDIANCE) 10 MG TABS tablet Take 1 tablet (10 mg total) by mouth daily before breakfast.  ? ENTRESTO 97-103 MG TAKE 1 TABLET BY MOUTH TWICE DAILY  ? fluticasone (FLONASE) 50 MCG/ACT nasal spray Place 1 spray into both nostrils 2 (two) times daily.   ? furosemide (LASIX) 40 MG tablet Take 2 tablets (80 mg total) by mouth 2 (two) times daily.  ? Multiple  Vitamins-Minerals (PRESERVISION AREDS PO) Take 2 capsules by mouth daily in the afternoon.  ? omeprazole (PRILOSEC OTC) 20 MG tablet Take 20 mg by mouth daily.  ? potassium chloride SA (KLOR-CON) 20 MEQ tablet Take 1 tablet (20 mEq total) by mouth daily.  ? spironolactone (ALDACTONE) 25 MG tablet Take 1 tablet (25 mg total) by mouth daily.  ? tamsulosin (FLOMAX) 0.4 MG CAPS capsule Take 0.4 mg by mouth daily.  ? vitamin B-12 (CYANOCOBALAMIN) 1000 MCG tablet Take 1,000 mcg by mouth daily.  ? zolpidem (AMBIEN CR) 12.5 MG CR tablet Take 1 tablet by mouth at bedtime.  ?  ? ?Allergies:   Ace inhibitors and Angiotensin receptor blockers  ? ?Social History  ? ?Socioeconomic History  ? Marital status: Married  ?  Spouse name: Not on file  ? Number of children: Not on file  ? Years of education: Not on file  ? Highest education level: Not on file  ?Occupational History  ? Occupation: real estate  ?  Comment: investment  ?Tobacco Use  ? Smoking status: Former  ?  Packs/day: 1.50  ?  Years: 43.00  ?  Pack years: 64.50  ?  Types: Cigarettes, Cigars  ?  Quit date: 08/02/2012  ?  Years since quitting: 9.6  ? Smokeless tobacco: Never  ?Vaping Use  ? Vaping Use: Never used  ?Substance and Sexual Activity  ? Alcohol use: Yes  ?  Alcohol/week: 0.0 standard drinks  ?  Comment: once a week; previously 3-4 times per week   ? Drug use: No  ? Sexual activity: Yes  ?Other Topics Concern  ? Not on file  ?Social History Narrative  ? Works as a Engineer, structural for apartments  ? Lives with wife.  They have one grown daughter  ? Highest level of education:  college  ? ?Social Determinants of Health  ? ?Financial Resource Strain: Not on file  ?Food Insecurity: Not on file  ?Transportation Needs: Not on file  ?Physical Activity: Not on file  ?Stress: Not on file  ?Social Connections: Not on file  ?  ? ?Family History: ?The patient's family history includes Atrial fibrillation in his mother; COPD in his father; Cerebral aneurysm in his  brother; Healthy in his daughter. ? ?ROS:   ?Please see the history of present illness.    ?Hesitancy is improved on tamsulosin.  Denies dizziness but I did caution him of the possibility of orthostatic fainting.  All other systems reviewed and are negative. ? ?EKGs/Labs/Other Studies Reviewed:   ? ?The following studies were reviewed today: ?No recent imaging ?2D Doppler echocardiogram 01/07/2021: ?IMPRESSIONS  ? ? ? 1. Left ventricular ejection fraction, by estimation, is 40%. The left  ?ventricle has moderately decreased function. The left ventricle  ?demonstrates global hypokinesis. There is mild concentric left ventricular  ?  hypertrophy. Left ventricular diastolic  ?parameters are indeterminate.  ? 2. Right ventricular systolic function is normal. The right ventricular  ?size is normal.  ? 3. The mitral valve is normal in structure. Trivial mitral valve  ?regurgitation.  ? 4. The aortic valve is tricuspid. Aortic valve regurgitation is not  ?visualized. No aortic stenosis is present.  ? 5. The inferior vena cava is dilated in size with >50% respiratory  ?variability, suggesting right atrial pressure of 8 mmHg.  ? ?Comparison(s): Compared to prior echo, the LV is less dilated with a LVIDd  ?6.3cm and EF appears slightly improved to 40%. Otherwise, no significant  ?change.  ? ?EKG:  EKG a new tracing is not performed ? ?Recent Labs: ?07/15/2021: BUN 17; Creatinine, Ser 1.08; Hemoglobin 14.6; Platelets 243; Potassium 3.7; Sodium 138  ?Recent Lipid Panel ?   ?Component Value Date/Time  ? CHOL 184 08/03/2013 0640  ? TRIG 149 08/03/2013 0640  ? HDL 50 08/03/2013 0640  ? CHOLHDL 3.7 08/03/2013 0640  ? VLDL 30 08/03/2013 0640  ? LDLCALC 104 (H) 08/03/2013 0640  ? ? ?Physical Exam:   ? ?VS:  BP 128/80   Pulse 92   Ht '5\' 11"'$  (1.803 m)   Wt (!) 316 lb 12.8 oz (143.7 kg)   SpO2 95%   BMI 44.18 kg/m?    ? ?Wt Readings from Last 3 Encounters:  ?03/19/22 (!) 316 lb 12.8 oz (143.7 kg)  ?09/24/21 (!) 319 lb 3.2 oz (144.8 kg)   ?07/28/21 (!) 319 lb 3.2 oz (144.8 kg)  ?  ? ?GEN: Overweight but marked improvement weight now 316 pounds.  Sleep greater than 350 pounds.. No acute distress ?HEENT: Normal ?NECK: No JVD. ?LYMPHATICS: No lymphadenopathy ?

## 2022-03-19 ENCOUNTER — Encounter: Payer: Self-pay | Admitting: Interventional Cardiology

## 2022-03-19 ENCOUNTER — Ambulatory Visit: Payer: 59 | Admitting: Interventional Cardiology

## 2022-03-19 VITALS — BP 128/80 | HR 92 | Ht 71.0 in | Wt 316.8 lb

## 2022-03-19 DIAGNOSIS — I2510 Atherosclerotic heart disease of native coronary artery without angina pectoris: Secondary | ICD-10-CM | POA: Diagnosis not present

## 2022-03-19 DIAGNOSIS — Z950 Presence of cardiac pacemaker: Secondary | ICD-10-CM

## 2022-03-19 DIAGNOSIS — I5022 Chronic systolic (congestive) heart failure: Secondary | ICD-10-CM

## 2022-03-19 DIAGNOSIS — I447 Left bundle-branch block, unspecified: Secondary | ICD-10-CM

## 2022-03-19 DIAGNOSIS — I10 Essential (primary) hypertension: Secondary | ICD-10-CM

## 2022-03-19 DIAGNOSIS — R7303 Prediabetes: Secondary | ICD-10-CM

## 2022-03-19 DIAGNOSIS — G4733 Obstructive sleep apnea (adult) (pediatric): Secondary | ICD-10-CM

## 2022-03-19 DIAGNOSIS — I428 Other cardiomyopathies: Secondary | ICD-10-CM | POA: Diagnosis not present

## 2022-03-19 DIAGNOSIS — I480 Paroxysmal atrial fibrillation: Secondary | ICD-10-CM

## 2022-03-19 NOTE — Patient Instructions (Signed)

## 2022-03-25 ENCOUNTER — Ambulatory Visit: Payer: 59 | Admitting: Emergency Medicine

## 2022-03-28 NOTE — Progress Notes (Signed)
?  ? ?Advanced Heart Failure Clinic Note ? ? ?Date:  03/29/2022  ? ?ID:  Nicholas Foley, DOB Aug 16, 1951, MRN [id number]  Location: Home  ?Provider location: [facility] ?Type of Visit: Established patient ? ?PCP:  Nicholas Small, MD  ?Cardiologist:  Nicholas Grooms, MD ?Primary HF: Nicholas Foley ? ?Chief Complaint: Heart Failure follow-up  ?  ?History of Present Illness: ? ?Nicholas Foley is a 71 year old with morbid obesity, chronic systolic heart failure, COPD, OSA, PAF, previously on amio-> stopped 2018,  HTN, LBBB, Medtronic CRT-P, and former smoker. ?  ?He has h/o NICM which seems to date back to 2014. Had cardiac cath in 1/20 with no CAD and well compensated filling pressures.  ?  ?Admitted in 1/20 for CRT-P device in setting of LBBB. Intially he felt a lot better but has gradually declined.  ? ?Echo EF 2/22 45% (in some images 45-50%) Limited by septal paradox. Personally reviewed ?Echo 01/30/20 EF 40-45% (read as 35-40%) ? ?Got COVID again in 1/22 but was a mild case.  ? ?Had recurrent AF. Underwent DC-CV 8/22  ? 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. Feels so much better. Lost 40 pounds.  ? ?Presents today for routine f/u. Doing well. Remains active in the gym doing cardio and weight training. NYHA Class I-II. No chest pain. Device interrogation shows stable impedence. Fluid index well below threshold. He has had very brief AT/AF, at least 3 episodes in the last month  each lasing < 10 min in duration. Asymptomatic. Fully compliant w/ Eliquis. No abnormal bleeding. Full compliant w/ CPAP.  ? ?BP is low normal, 100/60. PCP just started Flomax ~2 weeks ago. No orthostatic symptoms.  ? ?ICD:  Fluid looks good. Impedence stable. Index well below threshold. Biv pacing 90% activity level 4 hr/day very brief AT/AF, at least 3 episodes in the last month  each lasing < 10 min in duration. Personally reviewed ? ? ? ?Cardiac Studies ?CPX 09/18/19  ?Limited by obesity and mild heart  failure ?Peak VO2 15.1  (80% predicted peak VO2) - when corrected to ibw pVO2 27.5 ?Slope 33 ?RER 1.02  ?  ?LHC/RHC 11/2018 ?No coronary disease.  ?RA 13 ?PA 37/20 (27)  ?PCWP 14 ?CO 8.5 ?CI 3.3  ?  ?ECHO 09/27/2019 -RV normal EF 45-50%  ?ECHO 09/2018 -EF 35-40% Grade II DD  ?   ? ?Past Medical History:  ?Diagnosis Date  ? Atrial fibrillation with rapid ventricular response ([place]) 08/02/2013  ? CHF (congestive heart failure) ([place])   ? Chronic systolic dysfunction of left ventricle 08/02/2013  ? BNP greater than 2000   ? COPD (chronic obstructive pulmonary disease) ([place])   ? Moderate by PFTs with response to bronchodilators, May 2014  ? Erectile dysfunction   ? Hypertension   ? Insomnia 08/02/13  ? Left bundle branch block 08/02/2013  ? Morbid obesity ([place]) 08/02/2013  ? OSA (obstructive sleep apnea)   ? severe OSA with AHI 74/hr now on CPAP at 8cm H2O  ? ?Past Surgical History:  ?Procedure Laterality Date  ? BIV PACEMAKER INSERTION CRT-P N/A 12/14/2018  ? Medtronic model [id number] Percepta Quad CRT-P MRI Nicholas Foley (serial Number [id number]) device implanted by Dr Nicholas Foley for CHF and LBBB  ? CARDIOVERSION N/A 08/06/2013  ? Procedure: CARDIOVERSION;  Surgeon: Nicholas Perla, MD;  Location[facility] ENDOSCOPY;  Service: Cardiovascular;  Laterality: N/A;  spoke with Nicholas Foley   ? CARDIOVERSION N/A 09/03/2013  ? Procedure: CARDIOVERSION;  Surgeon: Nicholas Grooms, MD;  Location: [place];  Service: Cardiovascular;  Laterality: N/A;  ? CARDIOVERSION N/A 07/21/2021  ? Procedure: CARDIOVERSION;  Surgeon: Nicholas Dresser, MD;  Location: [facility] ENDOSCOPY;  Service: Cardiovascular;  Laterality: N/A;  ? chronic systolic dysfu    ? NASAL SEPTUM SURGERY    ? RIGHT/LEFT HEART CATH AND CORONARY ANGIOGRAPHY N/A 12/04/2018  ? Procedure: RIGHT/LEFT HEART CATH AND CORONARY ANGIOGRAPHY;  Surgeon: Nicholas Crome, MD;  Location: [place] CV LAB;  Service: Cardiovascular;  Laterality: N/A;  ? TEE WITHOUT CARDIOVERSION N/A 08/06/2013  ? Procedure:  TRANSESOPHAGEAL ECHOCARDIOGRAM (TEE);  Surgeon: Nicholas Perla, MD;  Location: [facility] ENDOSCOPY;  Service: Cardiovascular;  Laterality: N/A;  ? VASECTOMY    ? ? ? ?Current Outpatient Medications  ?Medication Sig Dispense Refill  ? acetaminophen (TYLENOL) 650 MG CR tablet Take 1,300 mg by mouth in the morning and at bedtime. Arthritis strength    ? ADVAIR DISKUS 250-50 MCG/DOSE AEPB Inhale 1 puff into the lungs 2 (two) times daily.    ? carvedilol (COREG) 12.5 MG tablet TAKE 1 TABLET(12.5 MG) BY MOUTH TWICE DAILY WITH A MEAL 60 tablet 3  ? cholecalciferol (VITAMIN D3) 25 MCG (1000 UT) tablet Take 1,000 Units by mouth daily.    ? CIALIS 20 MG tablet Take 20 mg by mouth daily as needed for erectile dysfunction.   0  ? cyclobenzaprine (FLEXERIL) 10 MG tablet Take 10 mg by mouth 3 (three) times daily.    ? ELIQUIS 5 MG TABS tablet TAKE 1 TABLET(5 MG) BY MOUTH TWICE DAILY 180 tablet 2  ? empagliflozin (JARDIANCE) 10 MG TABS tablet Take 1 tablet (10 mg total) by mouth daily before breakfast. 90 tablet 3  ? ENTRESTO 97-103 MG TAKE 1 TABLET BY MOUTH TWICE DAILY 180 tablet 3  ? fluticasone (FLONASE) 50 MCG/ACT nasal spray Place 1 spray into both nostrils 2 (two) times daily.   0  ? furosemide (LASIX) 40 MG tablet Take 2 tablets (80 mg total) by mouth 2 (two) times daily. 360 tablet 0  ? Multiple Vitamins-Minerals (PRESERVISION AREDS PO) Take 2 capsules by mouth daily in the afternoon.    ? omeprazole (PRILOSEC OTC) 20 MG tablet Take 20 mg by mouth daily.    ? potassium chloride SA (KLOR-CON) 20 MEQ tablet Take 1 tablet (20 mEq total) by mouth daily. 90 tablet 3  ? spironolactone (ALDACTONE) 25 MG tablet Take 1 tablet (25 mg total) by mouth daily. 90 tablet 1  ? tamsulosin (FLOMAX) 0.4 MG CAPS capsule Take 0.4 mg by mouth daily.    ? vitamin B-12 (CYANOCOBALAMIN) 1000 MCG tablet Take 1,000 mcg by mouth daily.    ? zolpidem (AMBIEN CR) 12.5 MG CR tablet Take 1 tablet by mouth at bedtime.    ? ?No current facility-administered  medications for this encounter.  ? ? ?Allergies:   Ace inhibitors and Angiotensin receptor blockers  ? ?Social History:  The patient  reports that he quit smoking about 9 years ago. His smoking use included cigarettes and cigars. He has a 64.50 pack-year smoking history. He has never used smokeless tobacco. He reports current alcohol use. He reports that he does not use drugs.  ? ?Family History:  The patient's family history includes Atrial fibrillation in his mother; COPD in his father; Cerebral aneurysm in his brother; Healthy in his daughter.  ? ?ROS:  Please see the history of present illness.   All other systems are personally reviewed and  negative.  ? ?Vitals:  ? 03/29/22 1009  ?BP: 100/60  ?Pulse: 86  ?SpO2: 94%  ? ? ? ? ?PHYSICAL EXAM: ?General:  Well appearing, obese male. No resp difficulty ?HEENT: normal ?Neck: supple. no JVD. Carotids 2+ bilat; no bruits. No lymphadenopathy or thryomegaly appreciated. ?Cor: PMI nondisplaced. Regular rate & rhythm. No rubs, gallops or murmurs. ?Lungs: clear ?Abdomen: obese but soft, nontender, nondistended. No hepatosplenomegaly. No bruits or masses. Good bowel sounds. ?Extremities: no cyanosis, clubbing, rash, edema ?Neuro: alert & orientedx3, cranial nerves grossly intact. moves all 4 extremities w/o difficulty. Affect pleasant ? ?Recent Labs: ?07/15/2021: BUN 17; Creatinine, Ser 1.08; Hemoglobin 14.6; Platelets 243; Potassium 3.7; Sodium 138  ?Personally reviewed  ? ?Wt Readings from Last 3 Encounters:  ?03/29/22 (!) 145.3 kg (320 lb 6.4 oz)  ?03/19/22 (!) 143.7 kg (316 lb 12.8 oz)  ?09/24/21 (!) 144.8 kg (319 lb 3.2 oz)  ?  ? ? ?ASSESSMENT AND PLAN: ? ?1. Chronic Combined Systolic/Diastolic HF ?- NICM (thought due to LBBB). 12/2018 LHC no coronary disease.  ?- s/p Medtronic CRT-P ?- ECHO 11/2017 EF 35-40%. ECHO 11/19 EF 45-50%.  ?- Echo 11/20 read as 35-40% but with Definity I felt closed to 45% ?- Echo 3/21 EF read as 35-40% I think 40-45% ?- Echo EF 2/22 45% (in some  images 45-50%) Limited by septal paradox. Personally reviewed ?- CPX test 10/20 suboptimal effort mostly limited to obesity  ?- ICD interrogation done personally. Results as above ?- Now much improved with going to

## 2022-03-29 ENCOUNTER — Encounter (HOSPITAL_COMMUNITY): Payer: Self-pay | Admitting: Internal Medicine

## 2022-03-29 ENCOUNTER — Ambulatory Visit (HOSPITAL_COMMUNITY)
Admission: RE | Admit: 2022-03-29 | Discharge: 2022-03-29 | Disposition: A | Discharge status: Home / Self Care | Payer: 59 | Source: Ambulatory Visit | Attending: Internal Medicine | Admitting: Internal Medicine

## 2022-03-29 VITALS — BP 100/60 | HR 86 | Wt 320.4 lb

## 2022-03-29 DIAGNOSIS — I10 Essential (primary) hypertension: Secondary | ICD-10-CM | POA: Diagnosis not present

## 2022-03-29 DIAGNOSIS — Z7901 Long term (current) use of anticoagulants: Secondary | ICD-10-CM | POA: Insufficient documentation

## 2022-03-29 DIAGNOSIS — Z79899 Other long term (current) drug therapy: Secondary | ICD-10-CM | POA: Insufficient documentation

## 2022-03-29 DIAGNOSIS — I480 Paroxysmal atrial fibrillation: Secondary | ICD-10-CM | POA: Diagnosis not present

## 2022-03-29 DIAGNOSIS — Z9989 Dependence on other enabling machines and devices: Secondary | ICD-10-CM | POA: Insufficient documentation

## 2022-03-29 DIAGNOSIS — Z87891 Personal history of nicotine dependence: Secondary | ICD-10-CM | POA: Diagnosis not present

## 2022-03-29 DIAGNOSIS — I5042 Chronic combined systolic (congestive) and diastolic (congestive) heart failure: Secondary | ICD-10-CM | POA: Insufficient documentation

## 2022-03-29 DIAGNOSIS — Z7984 Long term (current) use of oral hypoglycemic drugs: Secondary | ICD-10-CM | POA: Insufficient documentation

## 2022-03-29 DIAGNOSIS — Z6841 Body Mass Index (BMI) 40.0 and over, adult: Secondary | ICD-10-CM | POA: Insufficient documentation

## 2022-03-29 DIAGNOSIS — N1831 Chronic kidney disease, stage 3a: Secondary | ICD-10-CM | POA: Insufficient documentation

## 2022-03-29 DIAGNOSIS — I130 Hypertensive heart and chronic kidney disease with heart failure and stage 1 through stage 4 chronic kidney disease, or unspecified chronic kidney disease: Secondary | ICD-10-CM | POA: Diagnosis not present

## 2022-03-29 DIAGNOSIS — I428 Other cardiomyopathies: Secondary | ICD-10-CM | POA: Diagnosis not present

## 2022-03-29 DIAGNOSIS — I447 Left bundle-branch block, unspecified: Secondary | ICD-10-CM | POA: Insufficient documentation

## 2022-03-29 DIAGNOSIS — J449 Chronic obstructive pulmonary disease, unspecified: Secondary | ICD-10-CM | POA: Diagnosis not present

## 2022-03-29 DIAGNOSIS — G4733 Obstructive sleep apnea (adult) (pediatric): Secondary | ICD-10-CM | POA: Diagnosis not present

## 2022-03-29 NOTE — Patient Instructions (Addendum)
Medication Changes: ? ?None, Continue current medications ? ?Lab Work: ? ?None ? ?Testing/Procedures: ? ?Your physician has requested that you have an echocardiogram. Echocardiography is a painless test that uses sound waves to create images of your heart. It provides your doctor with information about the size and shape of your heart and how well your heart?s chambers and valves are working. This procedure takes approximately one hour. There are no restrictions for this procedure. ? ? ?Referrals: ? ?You have been referred to the Pharmacy Clinic for weight loss mediation, they will call you to discuss ? ?Special Instructions // Education: ? ?Do the following things EVERYDAY: ?Weigh yourself in the morning before breakfast. Write it down and keep it in a log. ?Take your medicines as prescribed ?Eat low salt foods--Limit salt (sodium) to 2000 mg per day.  ?Stay as active as you can everyday ?Limit all fluids for the day to less than 2 liters ? ? ?Follow-Up in: 6 months (Nov 2023), **PLEASE CALL OUR OFFICE IN September TO SCHEDULE THIS ? ?At the [facility], you and your health needs are our priority. We have a designated team specialized in the treatment of Heart Failure. This Care Team includes your primary Heart Failure Specialized Cardiologist (physician), Advanced Practice Providers (APPs- Physician Assistants and Nurse Practitioners), and Pharmacist who all work together to provide you with the care you need, when you need it.  ? ?You may see any of the following providers on your designated Care Team at your next follow up: ? ?Dr Glori Bickers ?Dr Loralie Champagne ?Darrick Grinder, NP ?Lyda Jester, PA ?Jessica Milford,NP ?Marlyce Huge, PA ?Audry Riles, PharmD ? ? ?Please be sure to bring in all your medications bottles to every appointment.  ? ?Need to Contact us: ? ?If you have any questions or concerns before your next appointment please send [place] a message through [place] or call our office  at [phone].   ? ?TO LEAVE A MESSAGE FOR THE NURSE SELECT OPTION 2, PLEASE LEAVE A MESSAGE INCLUDING: ?YOUR NAME ?DATE OF BIRTH ?CALL BACK NUMBER ?REASON FOR CALL**this is important as we prioritize the call backs ? ?YOU WILL RECEIVE A CALL BACK THE SAME DAY AS LONG AS YOU CALL BEFORE 4:00 PM ? ? ?

## 2022-03-30 ENCOUNTER — Telehealth: Payer: Self-pay | Admitting: Pharmacist Clinician (PhC)/ Clinical Pharmacy Specialist

## 2022-03-30 NOTE — Telephone Encounter (Signed)
-----   Message from Leeroy Bock, [place] sent at 03/30/2022  7:27 AM EDT ----- ? ?----- Message ----- ?From: Stanford Scotland, RN ?Sent: 03/29/2022  10:55 AM EDT ?To: Leeroy Bock, RPH-CPP ? ?Ref for weight loss meds, thanks ? ? ?

## 2022-03-30 NOTE — Telephone Encounter (Signed)
Spoke with patient.  Is > 65, but on commercial insurance still.   States he does not have any plans to retire or switch to Medicare plan at this point.   Will run through insurance, and if approved will schedule patient OV for medication information/management.   ? ?Ran PA through [facility]  Denied, as Mancel Parsons not an approved med on formulary.   ? ? ?LMOM for patient.   ?

## 2022-04-12 ENCOUNTER — Ambulatory Visit (HOSPITAL_COMMUNITY)
Admission: RE | Admit: 2022-04-12 | Discharge: 2022-04-12 | Disposition: A | Discharge status: Home / Self Care | Payer: 59 | Source: Ambulatory Visit | Attending: Internal Medicine | Admitting: Internal Medicine

## 2022-04-12 DIAGNOSIS — I4891 Unspecified atrial fibrillation: Secondary | ICD-10-CM | POA: Diagnosis not present

## 2022-04-12 DIAGNOSIS — I5042 Chronic combined systolic (congestive) and diastolic (congestive) heart failure: Secondary | ICD-10-CM

## 2022-04-12 DIAGNOSIS — I110 Hypertensive heart disease with heart failure: Secondary | ICD-10-CM | POA: Diagnosis not present

## 2022-04-12 DIAGNOSIS — J449 Chronic obstructive pulmonary disease, unspecified: Secondary | ICD-10-CM | POA: Diagnosis not present

## 2022-04-12 LAB — ECHOCARDIOGRAM COMPLETE
AR max vel: 3.07 cm2
AV Peak grad: 4.5 mmHg
Ao pk vel: 1.07 m/s
Area-P 1/2: 4.21 cm2
MV VTI: 1.33 cm2
S' Lateral: 4.1 cm

## 2022-04-12 MED ORDER — PERFLUTREN LIPID MICROSPHERE
1.0000 mL | INTRAVENOUS | Status: AC | PRN
  Administered 2022-04-12: 6 mL via INTRAVENOUS

## 2022-04-21 ENCOUNTER — Ambulatory Visit (INDEPENDENT_AMBULATORY_CARE_PROVIDER_SITE_OTHER): Payer: 59

## 2022-04-21 DIAGNOSIS — I428 Other cardiomyopathies: Secondary | ICD-10-CM

## 2022-04-22 LAB — CUP PACEART REMOTE DEVICE CHECK
Battery Remaining Longevity: 70 mo
Battery Voltage: 2.98 V
Brady Statistic AP VP Percent: 0.76 %
Brady Statistic AP VS Percent: 0.03 %
Brady Statistic AS VP Percent: 83.63 %
Brady Statistic AS VS Percent: 15.58 %
Brady Statistic RA Percent Paced: 0.8 %
Brady Statistic RV Percent Paced: 45.33 %
Date Time Interrogation Session: 20230530231741
Implantable Lead Implant Date: 20200123
Implantable Lead Implant Date: 20200123
Implantable Lead Implant Date: 20200123
Implantable Lead Location: 753858
Implantable Lead Location: 753859
Implantable Lead Location: 753860
Implantable Lead Model: 4598
Implantable Lead Model: 5076
Implantable Lead Model: 5076
Implantable Pulse Generator Implant Date: 20200123
Lead Channel Impedance Value: 1159 Ohm
Lead Channel Impedance Value: 1216 Ohm
Lead Channel Impedance Value: 1235 Ohm
Lead Channel Impedance Value: 361 Ohm
Lead Channel Impedance Value: 399 Ohm
Lead Channel Impedance Value: 418 Ohm
Lead Channel Impedance Value: 513 Ohm
Lead Channel Impedance Value: 532 Ohm
Lead Channel Impedance Value: 608 Ohm
Lead Channel Impedance Value: 608 Ohm
Lead Channel Impedance Value: 760 Ohm
Lead Channel Impedance Value: 798 Ohm
Lead Channel Impedance Value: 950 Ohm
Lead Channel Impedance Value: 950 Ohm
Lead Channel Pacing Threshold Amplitude: 0.375 V
Lead Channel Pacing Threshold Amplitude: 0.875 V
Lead Channel Pacing Threshold Amplitude: 2.75 V
Lead Channel Pacing Threshold Pulse Width: 0.4 ms
Lead Channel Pacing Threshold Pulse Width: 0.4 ms
Lead Channel Pacing Threshold Pulse Width: 0.5 ms
Lead Channel Sensing Intrinsic Amplitude: 1.25 mV
Lead Channel Sensing Intrinsic Amplitude: 1.25 mV
Lead Channel Sensing Intrinsic Amplitude: 4.875 mV
Lead Channel Sensing Intrinsic Amplitude: 4.875 mV
Lead Channel Setting Pacing Amplitude: 1.75 V
Lead Channel Setting Pacing Amplitude: 2.5 V
Lead Channel Setting Pacing Amplitude: 4 V
Lead Channel Setting Pacing Pulse Width: 0.4 ms
Lead Channel Setting Pacing Pulse Width: 0.5 ms
Lead Channel Setting Sensing Sensitivity: 2 mV

## 2022-04-23 ENCOUNTER — Ambulatory Visit (INDEPENDENT_AMBULATORY_CARE_PROVIDER_SITE_OTHER): Payer: 59

## 2022-04-23 DIAGNOSIS — Z950 Presence of cardiac pacemaker: Secondary | ICD-10-CM | POA: Diagnosis not present

## 2022-04-23 DIAGNOSIS — I5042 Chronic combined systolic (congestive) and diastolic (congestive) heart failure: Secondary | ICD-10-CM

## 2022-04-23 NOTE — Progress Notes (Signed)
EPIC Encounter for ICM Monitoring  Patient Name: Nicholas Foley is a 71 y.o. male Date: 04/23/2022 Primary Care Physican: [name], MD Primary Cardiologist: Smith/Bensimhon Electrophysiologist: Allred Bi-V Pacing: 86.5%    03/17/2022 Weight: 300 lbs  04/23/2022 Weight: Not weighing at home   Time in AT/AF <0.1 hr/day (<0.1%)           Spoke with patient and heart failure questions reviewed.  He reports feeling sluggish earlier this week but better today.  He feels like the fluid has resolved.          Optivol thoracic impedance suggesting possible fluid accumulation starting 5/16 back at baseline 6/1 but returned to trending slightly below baseline today, 6/2.   Prescribed: Furosemide 40 mg take 2 tablets (80 mg total) twice a day.  Potassium 20 mEq take 1 tablet daily   Labs: 05/04/2021 Creatinine 1.25, BUN 20, Potassium 4.4, Sodium 136, GFR 63 01/07/2021 Creatinine 1.20, BUN 17, Potassium 3.7, Sodium 139, GFR >60 A complete set of results can be found in Results Review.   Recommendations:  Patient is working out and watching diet.  Will recheck fluid levels 6/5   Follow-up plan: ICM clinic phone appointment on 04/26/2022 to recheck fluid levels.   91 day device clinic remote transmission 07/21/2022.     EP/Cardiology Office Visits:    Recall for 01/14/2021 with Dr [name].  Recall 09/26/2022 with HF Clinic.  Recall 02/22/2022 with Dr [name].   Copy of ICM check sent to Dr. [name].      3 month ICM trend: 04/23/2022.    12-14 Month ICM trend:     Rosalene Billings, RN 04/23/2022 9:13 AM

## 2022-04-26 ENCOUNTER — Ambulatory Visit (INDEPENDENT_AMBULATORY_CARE_PROVIDER_SITE_OTHER): Payer: 59

## 2022-04-26 ENCOUNTER — Telehealth: Payer: Self-pay

## 2022-04-26 DIAGNOSIS — Z950 Presence of cardiac pacemaker: Secondary | ICD-10-CM

## 2022-04-26 DIAGNOSIS — I5042 Chronic combined systolic (congestive) and diastolic (congestive) heart failure: Secondary | ICD-10-CM

## 2022-04-26 NOTE — Progress Notes (Signed)
EPIC Encounter for ICM Monitoring  Patient Name: Nicholas Foley is a 71 y.o. male Date: 04/26/2022 Primary Care Physican: [name], MD Primary Cardiologist: Smith/Bensimhon Electrophysiologist: Allred Bi-V Pacing: 90%    03/17/2022 Weight: 300 lbs  04/23/2022 Weight: Not weighing at home   Time in AT/AF  <0.1 hr/day (<0.1%)           Attempted call to patient and unable to reach.  Left detailed message per DPR regarding transmission. Transmission reviewed.         Optivol thoracic impedance suggesting possible fluid accumulation returned 6/2.  Fluid index > normal threshold starting 5/28.   Prescribed: Furosemide 40 mg take 2 tablets (80 mg total) twice a day.  Potassium 20 mEq take 1 tablet daily   Labs: 05/04/2021 Creatinine 1.25, BUN 20, Potassium 4.4, Sodium 136, GFR 63 01/07/2021 Creatinine 1.20, BUN 17, Potassium 3.7, Sodium 139, GFR >60 A complete set of results can be found in Results Review.   Recommendations:  Left voice mail with ICM number and encouraged to call if experiencing any fluid symptoms.   Follow-up plan: ICM clinic phone appointment on 05/03/2022 to recheck fluid levels.   91 day device clinic remote transmission 07/21/2022.     EP/Cardiology Office Visits:    Recall for 01/14/2021 with Dr [name].  Recall 09/26/2022 with HF Clinic.  Recall 02/22/2022 with Dr [name].   Copy of ICM check sent to Dr. [name].     3 month ICM trend: 04/26/2022.    12-14 Month ICM trend:     Rosalene Billings, RN 04/26/2022 10:58 AM

## 2022-04-26 NOTE — Telephone Encounter (Signed)
Remote ICM transmission received.  Attempted call to patient regarding ICM remote transmission and left detailed message per DPR to return call.   

## 2022-04-26 NOTE — Progress Notes (Signed)
Spoke with patient and heart failure questions reviewed.  Pt reports feeling very sluggish and SOB since 6/4 and did not work today.    He took extra 40 mg Furosemide this morning and if needed will take another extra one this evening.   If he feels the fluid is not resolving he will call back by Thursday.      Copy sent to Dr Haroldine Laws for review and recommendations if needed.

## 2022-05-03 ENCOUNTER — Ambulatory Visit (INDEPENDENT_AMBULATORY_CARE_PROVIDER_SITE_OTHER): Payer: 59

## 2022-05-03 ENCOUNTER — Telehealth: Payer: Self-pay

## 2022-05-03 DIAGNOSIS — Z950 Presence of cardiac pacemaker: Secondary | ICD-10-CM

## 2022-05-03 DIAGNOSIS — I5042 Chronic combined systolic (congestive) and diastolic (congestive) heart failure: Secondary | ICD-10-CM

## 2022-05-03 NOTE — Progress Notes (Signed)
EPIC Encounter for ICM Monitoring  Patient Name: Nicholas Foley is a 71 y.o. male Date: 05/03/2022 Primary Care Physican: [name], MD Primary Cardiologist: Smith/Bensimhon Electrophysiologist: Allred Bi-V Pacing: 90.3%    03/17/2022 Weight: 300 lbs  04/23/2022 Weight: Not weighing at home   Time in AT/AF  <0.1 hr/day (<0.1%)           Attempted call to patient and unable to reach.  Left detailed message per DPR regarding transmission. Transmission reviewed.         Optivol thoracic impedance suggesting fluid levels and fluid index returned to normal.     Prescribed: Furosemide 40 mg take 2 tablets (80 mg total) twice a day.  Potassium 20 mEq take 1 tablet daily   Labs: 05/04/2021 Creatinine 1.25, BUN 20, Potassium 4.4, Sodium 136, GFR 63 01/07/2021 Creatinine 1.20, BUN 17, Potassium 3.7, Sodium 139, GFR >60 A complete set of results can be found in Results Review.   Recommendations:  Left voice mail with ICM number and encouraged to call if experiencing any fluid symptoms.   Follow-up plan: ICM clinic phone appointment on 05/24/2022.   91 day device clinic remote transmission 07/21/2022.     EP/Cardiology Office Visits:    Recall for 01/14/2021 with Dr [name].  Recall 09/26/2022 with HF Clinic.  Recall 02/22/2022 with Dr [name].   Copy of ICM check sent to Dr. [name].     3 month ICM trend: 05/03/2022.    12-14 Month ICM trend:     Rosalene Billings, RN 05/03/2022 4:55 PM

## 2022-05-03 NOTE — Telephone Encounter (Signed)
Remote ICM transmission received.  Attempted call to patient regarding ICM remote transmission and left detailed message per DPR.  Advised to return call for any fluid symptoms or questions. Next ICM remote transmission scheduled 05/24/2022.

## 2022-05-04 NOTE — Progress Notes (Signed)
Remote pacemaker transmission.   

## 2022-05-23 LAB — CUP PACEART REMOTE DEVICE CHECK
Battery Remaining Longevity: 72 mo
Battery Voltage: 2.98 V
Brady Statistic AP VP Percent: 1.36 %
Brady Statistic AP VS Percent: 0.04 %
Brady Statistic AS VP Percent: 82.37 %
Brady Statistic AS VS Percent: 16.22 %
Brady Statistic RA Percent Paced: 1.46 %
Brady Statistic RV Percent Paced: 59.54 %
Date Time Interrogation Session: 20230702231548
Implantable Lead Implant Date: 20200123
Implantable Lead Implant Date: 20200123
Implantable Lead Implant Date: 20200123
Implantable Lead Location: 753858
Implantable Lead Location: 753859
Implantable Lead Location: 753860
Implantable Lead Model: 4598
Implantable Lead Model: 5076
Implantable Lead Model: 5076
Implantable Pulse Generator Implant Date: 20200123
Lead Channel Impedance Value: 1026 Ohm
Lead Channel Impedance Value: 1026 Ohm
Lead Channel Impedance Value: 1159 Ohm
Lead Channel Impedance Value: 1273 Ohm
Lead Channel Impedance Value: 1292 Ohm
Lead Channel Impedance Value: 361 Ohm
Lead Channel Impedance Value: 380 Ohm
Lead Channel Impedance Value: 437 Ohm
Lead Channel Impedance Value: 437 Ohm
Lead Channel Impedance Value: 513 Ohm
Lead Channel Impedance Value: 665 Ohm
Lead Channel Impedance Value: 665 Ohm
Lead Channel Impedance Value: 798 Ohm
Lead Channel Impedance Value: 893 Ohm
Lead Channel Pacing Threshold Amplitude: 0.5 V
Lead Channel Pacing Threshold Amplitude: 0.625 V
Lead Channel Pacing Threshold Amplitude: 3 V
Lead Channel Pacing Threshold Pulse Width: 0.4 ms
Lead Channel Pacing Threshold Pulse Width: 0.4 ms
Lead Channel Pacing Threshold Pulse Width: 0.5 ms
Lead Channel Sensing Intrinsic Amplitude: 1.25 mV
Lead Channel Sensing Intrinsic Amplitude: 1.25 mV
Lead Channel Sensing Intrinsic Amplitude: 5.125 mV
Lead Channel Sensing Intrinsic Amplitude: 5.125 mV
Lead Channel Setting Pacing Amplitude: 1.5 V
Lead Channel Setting Pacing Amplitude: 2.5 V
Lead Channel Setting Pacing Amplitude: 4 V
Lead Channel Setting Pacing Pulse Width: 0.4 ms
Lead Channel Setting Pacing Pulse Width: 0.5 ms
Lead Channel Setting Sensing Sensitivity: 2 mV

## 2022-05-24 ENCOUNTER — Ambulatory Visit (INDEPENDENT_AMBULATORY_CARE_PROVIDER_SITE_OTHER): Payer: 59

## 2022-05-24 ENCOUNTER — Telehealth: Payer: Self-pay

## 2022-05-24 DIAGNOSIS — Z950 Presence of cardiac pacemaker: Secondary | ICD-10-CM

## 2022-05-24 DIAGNOSIS — I5042 Chronic combined systolic (congestive) and diastolic (congestive) heart failure: Secondary | ICD-10-CM | POA: Diagnosis not present

## 2022-05-24 NOTE — Telephone Encounter (Signed)
Remote ICM transmission received.  Attempted call to patient regarding ICM remote transmission and left detailed message per DPR.  Advised to return call for any fluid symptoms or questions.  

## 2022-05-24 NOTE — Progress Notes (Signed)
EPIC Encounter for ICM Monitoring  Patient Name: Nicholas Foley is a 71 y.o. male Date: 05/24/2022 Primary Care Physican: [name], MD Primary Cardiologist: Smith/Bensimhon Electrophysiologist: Allred Bi-V Pacing: 83.7%    03/17/2022 Weight: 300 lbs  04/23/2022 Weight: Not weighing at home   Clinical Status Since 02-May-2022 Monitored VT (>4 beats) 8 Fast A&V 2 AT/AF 0 Time in AT/AF <0.1 hr/day (<0.1%)           Attempted call to patient and unable to reach.  Left detailed message per DPR regarding transmission. Transmission reviewed.         Optivol thoracic impedance suggesting possible fluid accumulation since 6/10.   Fluid index is greater than normal threshold starting 6/20   Prescribed: Furosemide 40 mg take 2 tablets (80 mg total) twice a day.  Pt reports he self adjust Furosemide and Dr Haroldine Laws is aware. Potassium 20 mEq take 1 tablet daily   Labs: 05/04/2021 Creatinine 1.25, BUN 20, Potassium 4.4, Sodium 136, GFR 63 01/07/2021 Creatinine 1.20, BUN 17, Potassium 3.7, Sodium 139, GFR >60 A complete set of results can be found in Results Review.   Recommendations:  Left voice mail with ICM number and encouraged to call if experiencing any fluid symptoms.   Follow-up plan: ICM clinic phone appointment on 05/28/2022 to recheck fluid levels.   91 day device clinic remote transmission 07/21/2022.     EP/Cardiology Office Visits:    Recall for 01/14/2021 with Dr [name].  Recall 09/26/2022 with HF Clinic.  Recall 02/22/2022 with Dr [name].   Copy of ICM check sent to Dr. [name].   Will send to HF clinic for review if patient is reached.     3 month ICM trend: 05/23/2022.    12-14 Month ICM trend:     Rosalene Billings, RN 05/24/2022 12:53 PM

## 2022-05-24 NOTE — Progress Notes (Signed)
Spoke with patient and heart failure questions reviewed.  Transmission reviewed.  Pt reports abdominal bloating and SOB.  He will take an extra Furosemide x 2 - 3 days.  Advised will recheck fluid levels on 7/7.

## 2022-05-27 ENCOUNTER — Other Ambulatory Visit: Payer: Self-pay | Admitting: Interventional Cardiology

## 2022-05-28 ENCOUNTER — Ambulatory Visit (INDEPENDENT_AMBULATORY_CARE_PROVIDER_SITE_OTHER): Payer: 59

## 2022-05-28 DIAGNOSIS — Z950 Presence of cardiac pacemaker: Secondary | ICD-10-CM

## 2022-05-28 DIAGNOSIS — I5042 Chronic combined systolic (congestive) and diastolic (congestive) heart failure: Secondary | ICD-10-CM

## 2022-05-28 NOTE — Progress Notes (Signed)
EPIC Encounter for ICM Monitoring  Patient Name: Nicholas Foley is a 71 y.o. male Date: 05/28/2022 Primary Care Physican: [name], MD Primary Cardiologist: Smith/Bensimhon Electrophysiologist: Allred Bi-V Pacing: 84.9%    03/17/2022 Weight: 300 lbs  04/23/2022 Weight: Not weighing at home   Clinical Status   Since 23-May-2022 Time in AT/AF 0.0 hr/day (0.0%)           Spoke with patient and heart failure questions reviewed.  Pt SOB and abdominal bloating resolved.  He is feeling much better since taking extra Furosemide and fluid accumulation has resolved.          Optivol thoracic impedance suggesting fluid levels and fluid index returned to normal after taking extra Furosemide for several days.    Prescribed: Furosemide 40 mg take 2 tablets (80 mg total) twice a day.  Pt reports he self adjust Furosemide and Dr Haroldine Laws is aware. Potassium 20 mEq take 1 tablet daily   Labs: 05/04/2021 Creatinine 1.25, BUN 20, Potassium 4.4, Sodium 136, GFR 63 01/07/2021 Creatinine 1.20, BUN 17, Potassium 3.7, Sodium 139, GFR >60 A complete set of results can be found in Results Review.   Recommendations:  No changes and encouraged to call if experiencing any fluid symptoms.   Follow-up plan: ICM clinic phone appointment on 06/28/2022.   91 day device clinic remote transmission 07/21/2022.     EP/Cardiology Office Visits:   06/03/2022 with Tommye Standard, PA.  Recall 09/26/2022 with HF Clinic.  Recall 02/22/2022 with Dr [name].   Copy of ICM check sent to Dr. [name].  3 month ICM trend: 05/28/2022.    12-14 Month ICM trend:     Rosalene Billings, RN 05/28/2022 3:22 PM

## 2022-06-01 ENCOUNTER — Other Ambulatory Visit: Payer: Self-pay | Admitting: Physician Assistant

## 2022-06-01 DIAGNOSIS — Z1211 Encounter for screening for malignant neoplasm of colon: Secondary | ICD-10-CM

## 2022-06-01 NOTE — Progress Notes (Addendum)
Cardiology Office Note Date:  06/03/2022  Patient ID:  Nicholas Foley, Nicholas Foley 1951/11/09, MRN [id number] PCP:  [name], MD  Cardiologist:  Dr. [name] AHF: Dr. Haroldine Laws Electrophysiologist: Dr. [name]   Chief Complaint:  over due annual visit  History of Present Illness: Nicholas Foley is a 71 y.o. male with history of OSA w/CPAP, AFib, NICM, chronic CHF (systolic), HTN, morbid obesity, LBBB  He saw Dr. [name] Nov 2019 for consult for AFib, planned for monitoring, updating echo and recommended perhaps cath/ischemic eval with follow up with Dr. [name]. Back to EP jan 2020, with NICM, LBBB.  Suspected LBBB induced CM and planned for CRT-P AFib felt to be minimal and not playing a role.  Had a telehealth visit 4.27.23, was feeling bette, had no wound concerns.This in the onslaught of COVID, planned to com in when able to get acute outputs changed  He has followed otherwise with Dr. [name] and the HF clinic. He saw Dr. [name] April 2023, feeling well, lost weight, no changes were made. Saw Dr. Haroldine Laws in May, doing well, brief episodes of AF <58mnutes, , 4hrs of activity daily, OptiVol looked ok No changes, planned for an updated echo  LVEF 50-55%, mod conc LVH, grade I DD, RV OK  + remotes  TODAY He continues to do  great Working full time, going to the gym Reports excellent exertional capacity No CP, palpitations or cardiac awareness No near syncope or syncope. No SOB No bleeding or signs of bleeding    Device information MDT CRT-P implanted 12/21/2018  AFib/AAD hx Diagnosed 2014 amiodarone +/- back in 2017, none since then as far as I can find  Past Medical History:  Diagnosis Date   [place] with rapid ventricular response ([facility] 08/02/2013   CHF (congestive heart failure) (HCC)    Chronic systolic dysfunction of left ventricle 08/02/2013   BNP greater than 2000    COPD (chronic obstructive pulmonary disease) (HCC)    Moderate by PFTs with response to  bronchodilators, May 2014   Erectile dysfunction    Hypertension    Insomnia 08/02/13   Left bundle branch block 08/02/2013   Morbid obesity ([facility] 08/02/2013   OSA (obstructive sleep apnea)    severe OSA with AHI 74/hr now on CPAP at 8cm H2O    Past Surgical History:  Procedure Laterality Date   BIV PACEMAKER INSERTION CRT-P N/A 12/14/2018   Medtronic model [id number] Quad CRT-P MRI Surescan (serial Number [id number]) device implanted by Dr ARayann Hemanfor CHF and LBBB   CARDIOVERSION N/A 08/06/2013   Procedure: CARDIOVERSION;  Surgeon: BLelon Perla MD;  [facility]  Service: Cardiovascular;  Laterality: N/A;  spoke with [name]    CARDIOVERSION N/A 09/03/2013   Procedure: CARDIOVERSION;  Surgeon: HSinclair Grooms MD;  [facility]  Service: Cardiovascular;  Laterality: N/A;   CARDIOVERSION N/A 07/21/2021   Procedure: CARDIOVERSION;  Surgeon: MLarey Dresser MD;  [facility];  Service: Cardiovascular;  Laterality: N/A;   chronic systolic dysfu     NASAL SEPTUM SURGERY     RIGHT/LEFT HEART CATH AND CORONARY ANGIOGRAPHY N/A 12/04/2018   Procedure: RIGHT/LEFT HEART CATH AND CORONARY ANGIOGRAPHY;  Surgeon: SBelva Crome MD;  [facility];  Service: Cardiovascular;  Laterality: N/A;   TEE WITHOUT CARDIOVERSION N/A 08/06/2013   Procedure: TRANSESOPHAGEAL ECHOCARDIOGRAM (TEE);  Surgeon: BLelon Perla MD;  [facility]  Service: Cardiovascular;  Laterality: N/A;   VASECTOMY  Current Outpatient Medications  Medication Sig Dispense Refill   acetaminophen (TYLENOL) 650 MG CR tablet Take 1,300 mg by mouth in the morning and at bedtime. Arthritis strength     ADVAIR DISKUS 250-50 MCG/DOSE AEPB Inhale 1 puff into the lungs 2 (two) times daily.     cholecalciferol (VITAMIN D3) 25 MCG (1000 UT) tablet Take 1,000 Units by mouth daily.     CIALIS 20 MG tablet Take 20 mg by mouth daily as needed for erectile dysfunction.   0    cyclobenzaprine (FLEXERIL) 10 MG tablet Take 10 mg by mouth 3 (three) times daily.     ELIQUIS 5 MG TABS tablet TAKE 1 TABLET(5 MG) BY MOUTH TWICE DAILY 180 tablet 2   empagliflozin (JARDIANCE) 10 MG TABS tablet Take 1 tablet (10 mg total) by mouth daily before breakfast. 90 tablet 3   ENTRESTO 97-103 MG TAKE 1 TABLET BY MOUTH TWICE DAILY 180 tablet 3   fluticasone (FLONASE) 50 MCG/ACT nasal spray Place 1 spray into both nostrils 2 (two) times daily.   0   furosemide (LASIX) 40 MG tablet TAKE 2 TABLETS(80 MG) BY MOUTH TWICE DAILY 360 tablet 2   Multiple Vitamins-Minerals (PRESERVISION AREDS PO) Take 2 capsules by mouth daily in the afternoon.     omeprazole (PRILOSEC OTC) 20 MG tablet Take 20 mg by mouth daily.     potassium chloride SA (KLOR-CON) 20 MEQ tablet Take 1 tablet (20 mEq total) by mouth daily. 90 tablet 3   spironolactone (ALDACTONE) 25 MG tablet Take 1 tablet (25 mg total) by mouth daily. 90 tablet 1   tamsulosin (FLOMAX) 0.4 MG CAPS capsule Take 0.4 mg by mouth daily.     vitamin B-12 (CYANOCOBALAMIN) 1000 MCG tablet Take 1,000 mcg by mouth daily.     zolpidem (AMBIEN CR) 12.5 MG CR tablet Take 1 tablet by mouth at bedtime.     carvedilol (COREG) 25 MG tablet Take 1 tablet (25 mg total) by mouth 2 (two) times daily with a meal. 180 tablet 3   No current facility-administered medications for this visit.    Allergies:   Ace inhibitors and Angiotensin receptor blockers   Social History:  The patient  reports that he quit smoking about 9 years ago. His smoking use included cigarettes and cigars. He has a 64.50 pack-year smoking history. He has never used smokeless tobacco. He reports current alcohol use. He reports that he does not use drugs.   Family History:  The patient's family history includes Atrial fibrillation in his mother; COPD in his father; Cerebral aneurysm in his brother; Healthy in his daughter.  ROS:  Please see the history of present illness.    All other systems  are reviewed and otherwise negative.   PHYSICAL EXAM:  VS:  BP (!) 142/78   Pulse 84   Ht '5\' 11"'$  (1.803 m)   Wt (!) 312 lb 9.6 oz (141.8 kg)   SpO2 94%   BMI 43.60 kg/m  BMI: Body mass index is 43.6 kg/m. Well nourished, well developed, in no acute distress HEENT: normocephalic, atraumatic Neck: no JVD, carotid bruits or masses Cardiac:   RRR; no significant murmurs, no rubs, or gallops Lungs:   CTA b/l, no wheezing, rhonchi or rales Abd: soft, nontender MS: no deformity or  atrophy Ext:  no edema Skin: warm and dry, no rash Neuro:  No gross deficits appreciated Psych: euthymic mood, full affect  PPM site is stable, no tethering or discomfort   EKG:  done today and reviewed by myself SR/VP, 84, QRS is 148m  Device interrogation done today and reviewed by myself:  Battery and lead measurements are good No VT He has had some VS episodes and episodes labeled VT/NSVT as well as 2 fast AV episodes All of these by morphology are an SVT, all fairly brief Many grouped in one day (without awareness by the pt, suspects he was at the gym)   04/12/22; TTE  1. There is significant LV dyysynchrony. Left ventricular ejection  fraction, by estimation, is 50 to 55%. The left ventricle has low normal  function. The left ventricle has no regional wall motion abnormalities.  There is moderate concentric left  ventricular hypertrophy. Left ventricular diastolic parameters are  consistent with Grade I diastolic dysfunction (impaired relaxation).   2. Right ventricular systolic function is normal. The right ventricular  size is normal.   3. Left atrial size was mildly dilated.   4. The mitral valve is normal in structure. No evidence of mitral valve  regurgitation. No evidence of mitral stenosis.   5. The aortic valve is tricuspid. There is mild calcification of the  aortic valve. Aortic valve regurgitation is not visualized. No aortic  stenosis is present.   6. The inferior vena cava is  normal in size with greater than 50%  respiratory variability, suggesting right atrial pressure of 3 mmHg.   Recent Labs: 07/15/2021: BUN 17; Creatinine, Ser 1.08; Hemoglobin 14.6; Platelets 243; Potassium 3.7; Sodium 138  No results found for requested labs within last 365 days.   CrCl cannot be calculated (Patient's most recent lab result is older than the maximum 21 days allowed.).   Wt Readings from Last 3 Encounters:  06/03/22 (!) 312 lb 9.6 oz (141.8 kg)  03/29/22 (!) 320 lb 6.4 oz (145.3 kg)  03/19/22 (!) 316 lb 12.8 oz (143.7 kg)     Other studies reviewed: Additional studies/records reviewed today include: summarized above  ASSESSMENT AND PLAN:  CRT-P Intact function No programming changes made  Paroxysmal Afib CHA2DS2Vasc is 3, on Eliquis, appropriately dosed 0 % [place]  NICM Chronic CHF Recovered LVEF No symptoms of volume OL Exam and OptiVol look good He did have volume recently, managed with ICM clinic BP% only 86%, perhaps 2/2 brief SVTs Coreg was increased  Follow via remotes   Disposition: F/u with remotes as usual.  In clinic in a year with EP, sooner if needed.  Will plan to transfer EP management to dr. LQuentin Ore  He follows closely with HF and cardiology teams  Current medicines are reviewed at length with the patient today.  The patient did not have any concerns regarding medicines.  SVenetia Night PA-C 06/03/2022 5:56 PM     [place] [phone](office)  [phone](fax)

## 2022-06-03 ENCOUNTER — Encounter: Payer: Self-pay | Admitting: Physician Assistant

## 2022-06-03 ENCOUNTER — Ambulatory Visit (INDEPENDENT_AMBULATORY_CARE_PROVIDER_SITE_OTHER): Payer: 59 | Admitting: Physician Assistant

## 2022-06-03 VITALS — BP 142/78 | HR 84 | Ht 71.0 in | Wt 312.6 lb

## 2022-06-03 DIAGNOSIS — I480 Paroxysmal atrial fibrillation: Secondary | ICD-10-CM

## 2022-06-03 DIAGNOSIS — I5042 Chronic combined systolic (congestive) and diastolic (congestive) heart failure: Secondary | ICD-10-CM | POA: Diagnosis not present

## 2022-06-03 DIAGNOSIS — I428 Other cardiomyopathies: Secondary | ICD-10-CM

## 2022-06-03 DIAGNOSIS — Z950 Presence of cardiac pacemaker: Secondary | ICD-10-CM

## 2022-06-03 DIAGNOSIS — I471 Supraventricular tachycardia: Secondary | ICD-10-CM

## 2022-06-03 LAB — CUP PACEART INCLINIC DEVICE CHECK
Date Time Interrogation Session: 20230713180829
Implantable Lead Implant Date: 20200123
Implantable Lead Implant Date: 20200123
Implantable Lead Implant Date: 20200123
Implantable Lead Location: 753858
Implantable Lead Location: 753859
Implantable Lead Location: 753860
Implantable Lead Model: 4598
Implantable Lead Model: 5076
Implantable Lead Model: 5076
Implantable Pulse Generator Implant Date: 20200123
Lead Channel Pacing Threshold Amplitude: 0.5 V
Lead Channel Pacing Threshold Amplitude: 0.75 V
Lead Channel Pacing Threshold Amplitude: 2 V
Lead Channel Pacing Threshold Pulse Width: 0.4 ms
Lead Channel Pacing Threshold Pulse Width: 0.5 ms
Lead Channel Pacing Threshold Pulse Width: 0.5 ms
Lead Channel Sensing Intrinsic Amplitude: 1.5 mV
Lead Channel Sensing Intrinsic Amplitude: 6 mV

## 2022-06-03 MED ORDER — CARVEDILOL 25 MG PO TABS: 25.0000 mg | ORAL_TABLET | Freq: Two times a day (BID) | ORAL | 3 refills | Status: DC

## 2022-06-03 NOTE — Patient Instructions (Signed)
Medication Instructions:  Your physician has recommended you make the following change in your medication:   INCREASE: Carvedilol to '25mg'$  twice daily  *If you need a refill on your cardiac medications before your next appointment, please call your pharmacy*   Lab Work: None If you have labs (blood work) drawn today and your tests are completely normal, you will receive your results only by: [place] (if you have MyChart) OR A paper copy in the mail If you have any lab test that is abnormal or we need to change your treatment, we will call you to review the results.   Follow-Up: At [facility], you and your health needs are our priority.  As part of our continuing mission to provide you with exceptional heart care, we have created designated Provider Care Teams.  These Care Teams include your primary Cardiologist (physician) and Advanced Practice Providers (APPs -  Physician Assistants and Nurse Practitioners) who all work together to provide you with the care you need, when you need it.   Your next appointment:   1 year(s)  The format for your next appointment:   In Person  Provider:   Lars Mage, MD{

## 2022-06-17 ENCOUNTER — Telehealth (HOSPITAL_COMMUNITY): Payer: Self-pay | Admitting: Cardiology

## 2022-06-17 NOTE — Telephone Encounter (Signed)
Abnormal labs received Cr 1.52  Per Allena Katz  Repeat bmet in 1-2 weeks with recent increase in diuretics   Pt aware and voiced understanding Reports he will arrange with PCP Also reports his pcp sent him to nephrology as a precaution/ kidney work up

## 2022-06-28 ENCOUNTER — Ambulatory Visit (INDEPENDENT_AMBULATORY_CARE_PROVIDER_SITE_OTHER): Payer: 59

## 2022-06-28 DIAGNOSIS — Z950 Presence of cardiac pacemaker: Secondary | ICD-10-CM

## 2022-06-28 DIAGNOSIS — I5042 Chronic combined systolic (congestive) and diastolic (congestive) heart failure: Secondary | ICD-10-CM | POA: Diagnosis not present

## 2022-06-28 NOTE — Progress Notes (Unsigned)
EPIC Encounter for ICM Monitoring  Patient Name: Nicholas Foley is a 71 y.o. male Date: 06/28/2022 Primary Care Physican: [name], MD Primary Cardiologist: Smith/Bensimhon Electrophysiologist: Allred Bi-V Pacing: 96.2%    03/17/2022 Weight: 300 lbs  04/23/2022 Weight: Not weighing at home   Clinical Status   Since 03-Jun-2022 Monitored VT (>4 beats) 2 Time in AT/AF  <0.1 hr/day (<0.1%)           Spoke with patient and heart failure questions reviewed.  Pt is asymptomatic for fluid accumulation.   He reports he is unable to sleep since Coreg dosage was increased at 7/13 OV with Tommye Standard, PA.  He is awake until 4 or 5 AM.          Referral:  Made to nephrologist.        Optivol thoracic impedance suggesting possible fluid accumulation starting 7/6.   Impedance has been unstable since 04/08/2022.    Prescribed: Furosemide 40 mg take 2 tablets (80 mg total) twice a day.  Pt reports he self adjust Furosemide and Dr Haroldine Laws is aware. Potassium 20 mEq take 1 tablet daily   Labs:   06/29/2022 BMET scheduled at PCP office. 07/15/2021 Creatinine 1.08, BUN 17, Potassium 3.7, Sodium 138. GFR >60 05/04/2021 Creatinine 1.25, BUN 20, Potassium 4.4, Sodium 136, GFR 63 01/07/2021 Creatinine 1.20, BUN 17, Potassium 3.7, Sodium 139, GFR >60 A complete set of results can be found in Results Review.   Recommendations:  Pt typically self adjusts Furosemide when needed but advised not take extra today since he has labs scheduled for tomorrow.  Advised copy sent to Dr Haroldine Laws for review and recommendations regarding Optivol.    Copy sent to Donnajean Lopes PA to inform patient is having difficulty sleeping since Coreg dosage was increased at [place].     Follow-up plan: ICM clinic phone appointment on 07/05/2022 to recheck fluid levels.   91 day device clinic remote transmission 07/21/2022.     EP/Cardiology Office Visits:   Recall 09/26/2022 with HF Clinic.  Recall 03/14/2023 with Dr [name].   Copy of ICM  check sent to Dr. [name].  3 month ICM trend: 06/28/2022.    12-14 Month ICM trend:     Rosalene Billings, RN 06/28/2022 3:56 PM

## 2022-06-29 ENCOUNTER — Telehealth: Payer: Self-pay | Admitting: *Deleted

## 2022-06-29 NOTE — Telephone Encounter (Signed)
Spoke with patient and will take Coreg twice a day 7am and at 4:30pm/5pm and not two tablets in am

## 2022-06-29 NOTE — Telephone Encounter (Signed)
Spoke with patient about sleep changes with Coreg dose changes 25 mg twice a day . Patient mentioned he has been having sleep issues for a long time and his Pcp has Rx Ambien for him. He mentions he just noticed it's gotten worser but will notified his Pcp. Patient stated he has been taken first dose Coreg 25 mg  at 7 am second dose at 4:30/5 pm and goes to bed around 9:30/10p. Per Renee recommendations for him to  Try to take in a couple hours prior to sleep. Patient was told to try taking two tablets in the am  to see if this helps with further time away from bed time. Patient said he would do that for the next couple of weeks and update [place] on any changes. Patient also mentioned how since last Friday his wife been in hospital with medical issues and he knows these things also are effecting him with his sleep.

## 2022-06-29 NOTE — Telephone Encounter (Signed)
-----   Message from [place], [place] sent at 06/29/2022  7:31 AM EDT ----- Would really like to be able to keep him on the higher coreg dose.  Can you please reach out and see about his sleep?  Is this brand new?  I would 1st try to make sure he is not taking the coreg right at bedtime.  Try to take in a couple hours prior to sleep.  Also maybe see his PMD about trouble sleeping.  renee ----- Message ----- From: Rosalene Billings, RN Sent: 06/28/2022   4:30 PM EDT To: [place], PA-C  Sent as FYI regarding pt reporting unable to sleep since coreg increased at 7/13 OV.   Thank you.

## 2022-07-05 ENCOUNTER — Ambulatory Visit (INDEPENDENT_AMBULATORY_CARE_PROVIDER_SITE_OTHER): Payer: 59

## 2022-07-05 ENCOUNTER — Other Ambulatory Visit (HOSPITAL_COMMUNITY): Payer: Self-pay | Admitting: Internal Medicine

## 2022-07-05 DIAGNOSIS — Z950 Presence of cardiac pacemaker: Secondary | ICD-10-CM

## 2022-07-05 DIAGNOSIS — I5042 Chronic combined systolic (congestive) and diastolic (congestive) heart failure: Secondary | ICD-10-CM

## 2022-07-07 NOTE — Progress Notes (Signed)
EPIC Encounter for ICM Monitoring  Patient Name: Nicholas Foley is a 71 y.o. male Date: 07/07/2022 Primary Care Physican: [name], MD Primary Cardiologist: Smith/Bensimhon Electrophysiologist: Allred Bi-V Pacing: 97.1%    03/17/2022 Weight: 300 lbs  04/23/2022 Weight: Not weighing at home   Since 27-Jun-2022 Time in AT/AF <0.1 hr/day (<0.1%)           Spoke with patient and heart failure questions reviewed.  Pt is asymptomatic for fluid accumulation.   He does report change in eating habits and in the last 2 months he has been eating out about twice a week.           Referral:  Made to nephrologist.        Optivol thoracic impedance suggesting fluid levels have returned close to normal.   Impedance has been unstable since 04/08/2022.    Prescribed: Furosemide 40 mg take 2 tablets (80 mg total) twice a day.  Pt reports he self adjust Furosemide and Dr Haroldine Laws is aware. Potassium 20 mEq take 1 tablet daily   Labs:   07/15/2021 Creatinine 1.08, BUN 17, Potassium 3.7, Sodium 138. GFR >60 05/04/2021 Creatinine 1.25, BUN 20, Potassium 4.4, Sodium 136, GFR 63 01/07/2021 Creatinine 1.20, BUN 17, Potassium 3.7, Sodium 139, GFR >60 A complete set of results can be found in Results Review.   Recommendations:  Recommendation to limit salt intake to 2000 mg daily and fluid intake to 64 oz daily.  Encouraged to call if experiencing any fluid symptoms.    Follow-up plan: ICM clinic phone appointment on 08/02/2022.   91 day device clinic remote transmission 10/20/2022.     EP/Cardiology Office Visits:   Recall 09/26/2022 with HF Clinic.  Recall 03/14/2023 with Dr [name].   Copy of ICM check sent to Dr. [name].  3 month ICM trend: 07/05/2022.    12-14 Month ICM trend:     Rosalene Billings, RN 07/07/2022 10:31 AM

## 2022-07-21 ENCOUNTER — Ambulatory Visit (INDEPENDENT_AMBULATORY_CARE_PROVIDER_SITE_OTHER): Payer: 59

## 2022-07-21 DIAGNOSIS — I428 Other cardiomyopathies: Secondary | ICD-10-CM | POA: Diagnosis not present

## 2022-07-25 LAB — CUP PACEART REMOTE DEVICE CHECK
Battery Remaining Longevity: 75 mo
Battery Voltage: 2.97 V
Brady Statistic AP VP Percent: 3.08 %
Brady Statistic AP VS Percent: 0.06 %
Brady Statistic AS VP Percent: 92.96 %
Brady Statistic AS VS Percent: 3.9 %
Brady Statistic RA Percent Paced: 3.3 %
Brady Statistic RV Percent Paced: 78.48 %
Date Time Interrogation Session: 20230830030602
Implantable Lead Implant Date: 20200123
Implantable Lead Implant Date: 20200123
Implantable Lead Implant Date: 20200123
Implantable Lead Location: 753858
Implantable Lead Location: 753859
Implantable Lead Location: 753860
Implantable Lead Model: 4598
Implantable Lead Model: 5076
Implantable Lead Model: 5076
Implantable Pulse Generator Implant Date: 20200123
Lead Channel Impedance Value: 1007 Ohm
Lead Channel Impedance Value: 1007 Ohm
Lead Channel Impedance Value: 1140 Ohm
Lead Channel Impedance Value: 1292 Ohm
Lead Channel Impedance Value: 1292 Ohm
Lead Channel Impedance Value: 361 Ohm
Lead Channel Impedance Value: 399 Ohm
Lead Channel Impedance Value: 456 Ohm
Lead Channel Impedance Value: 513 Ohm
Lead Channel Impedance Value: 532 Ohm
Lead Channel Impedance Value: 684 Ohm
Lead Channel Impedance Value: 684 Ohm
Lead Channel Impedance Value: 817 Ohm
Lead Channel Impedance Value: 893 Ohm
Lead Channel Pacing Threshold Amplitude: 0.5 V
Lead Channel Pacing Threshold Amplitude: 1 V
Lead Channel Pacing Threshold Amplitude: 2.5 V
Lead Channel Pacing Threshold Pulse Width: 0.4 ms
Lead Channel Pacing Threshold Pulse Width: 0.4 ms
Lead Channel Pacing Threshold Pulse Width: 0.5 ms
Lead Channel Sensing Intrinsic Amplitude: 0.75 mV
Lead Channel Sensing Intrinsic Amplitude: 0.75 mV
Lead Channel Sensing Intrinsic Amplitude: 5.375 mV
Lead Channel Sensing Intrinsic Amplitude: 5.375 mV
Lead Channel Setting Pacing Amplitude: 2 V
Lead Channel Setting Pacing Amplitude: 2.5 V
Lead Channel Setting Pacing Amplitude: 3.75 V
Lead Channel Setting Pacing Pulse Width: 0.4 ms
Lead Channel Setting Pacing Pulse Width: 0.5 ms
Lead Channel Setting Sensing Sensitivity: 2 mV

## 2022-07-28 ENCOUNTER — Telehealth: Payer: Self-pay

## 2022-07-28 NOTE — Telephone Encounter (Signed)
Called pt to schedule sooner appointment. No answer left vm for return call.

## 2022-07-28 NOTE — Telephone Encounter (Signed)
Returned call to patient as requested by voice mail message. Pt reports feeling bloated with fluid and asked if he should follow Dr Bensimhon's advice at a previous office visit to take extra as needed. Advised to follow his instructions previously given and if approved then he should take extra Furosemide.  Advised reviewed previous remote transmissions which suggests a change in pattern since May.  Report suggesting he is retaining fluid more often than not but prior to May, report suggests stable fluid levels.  He reports he has not changed his diet in the past few months and unsure what is causing the fluid accumulation.  Advised to call Dr Bensimhon's office to for appointment for follow up instead of waiting until November for routine appointment.  He agreed to call today and will take extra fluid pill for next 2 days.  Next ICM remote transmission due 9/11.

## 2022-07-29 ENCOUNTER — Other Ambulatory Visit (HOSPITAL_COMMUNITY): Payer: Self-pay | Admitting: Internal Medicine

## 2022-08-02 ENCOUNTER — Telehealth: Payer: Self-pay

## 2022-08-02 ENCOUNTER — Ambulatory Visit (INDEPENDENT_AMBULATORY_CARE_PROVIDER_SITE_OTHER): Payer: 59

## 2022-08-02 DIAGNOSIS — Z950 Presence of cardiac pacemaker: Secondary | ICD-10-CM | POA: Diagnosis not present

## 2022-08-02 DIAGNOSIS — I5042 Chronic combined systolic (congestive) and diastolic (congestive) heart failure: Secondary | ICD-10-CM

## 2022-08-02 NOTE — Telephone Encounter (Signed)
9/11/023 [facility] ICM remote transmission reporting increase in AT/AF burden to 17% since 8/30 from <0.1 hr/day (<0.1%) shown on 8/30.  Pt prescribed Eliquis.  Routed to device clinic triage to evaluate if any recommendations are needed.  Pt has been transitioned to Dr Quentin Ore (replacing Dr [name]).

## 2022-08-02 NOTE — Progress Notes (Unsigned)
Received updated report.  Advised patient fluid levels remain unchanged from report received 12 hours ago.  Pt surprised because he has been urinating so much he thought it would improve.  He feels really "rung out".     Report shows AT/AF episode in progress.    Copy sent to Dr Haroldine Laws for review and recommendations if needed.     Updated 9/11 at 3:19 PM:  Optivol thoracic impedance continues to suggesting ongoing fluid accumulation.

## 2022-08-02 NOTE — Telephone Encounter (Addendum)
Hx of PAF, continue to monitor.

## 2022-08-02 NOTE — Progress Notes (Unsigned)
EPIC Encounter for ICM Monitoring  Patient Name: Nicholas Foley is a 71 y.o. male Date: 08/02/2022 Primary Care Physican: [name], MD Primary Cardiologist: Smith/Bensimhon Electrophysiologist: Marisa Sprinkles Pacing:  decreased from 97.1% to 90.2% 03/17/2022 Weight: 300 lbs  04/23/2022 Weight: Not weighing at home   Since 21-Jul-2022 Time in AT/AF  increased from <0.1 hr/day (<0.1%) to 4.1 hr/day (17.0%) Since 21-Jul-2022  (Pt on Eliquis)           Spoke with patient and heart failure questions reviewed.  Pt feels extremely tired but has had large amount of urination after taking extra Furosemide on Thursday and Friday last week.  He stayed home from work since he generally not feeling well.  He cannot tell he is in Afib.            Referral:  Made to nephrologist.        Optivol thoracic impedance suggesting possible fluid accumulation starting 8/18.  Report also suggesting increase in AT/AF Burden increased from <0.1 hr/day to 4.1 hr/day (17.0%) since 8/30.  Information routed to [facility] for review on 9/11.   Impedance has been unstable since 04/08/2022.    Prescribed: Furosemide 40 mg take 2 tablets (80 mg total) twice a day.  Pt reports he self adjust Furosemide and Dr Haroldine Laws is aware. Potassium 20 mEq take 1 tablet daily   Labs:   07/15/2021 Creatinine 1.08, BUN 17, Potassium 3.7, Sodium 138. GFR >60 05/04/2021 Creatinine 1.25, BUN 20, Potassium 4.4, Sodium 136, GFR 63 01/07/2021 Creatinine 1.20, BUN 17, Potassium 3.7, Sodium 139, GFR >60 A complete set of results can be found in Results Review.   Recommendations:  Sent to Dr Haroldine Laws for review and recommendations due to report currently suggesting fluid accumulation and has not been able to maintain normal fluid levels since May.     Follow-up plan: ICM clinic phone appointment on 08/09/2022 to recheck fluid levels.   91 day device clinic remote transmission 10/20/2022.     EP/Cardiology Office Visits:   09/02/2022 with  Dr Haroldine Laws.  Recall 03/14/2023 with Dr [name].   Copy of ICM check sent to Dr Quentin Ore (replacing Dr. [name]).  3 month ICM trend: 08/02/2022.    12-14 Month ICM trend:     Rosalene Billings, RN 08/02/2022 9:05 AM

## 2022-08-02 NOTE — Progress Notes (Unsigned)
Simone Curia, RN      08/02/22  9:24 AM Note Hx of PAF, continue to monitor.

## 2022-08-03 NOTE — Progress Notes (Signed)
Spoke with patient and he still is not feeling well.  He knows he has fluid.  He stayed home from work for 2 days and he asked if she should try to get earlier appointment with HF clinic.  Secure chat message sent to HF clinic and appointment with APP is 9/15 at 9:00 AM and patient grateful to be able to get the appointment.  Advised if he needs any further assistance this week to call Dr Bensimhon's office directly since I will be out of the office as the end of the week.

## 2022-08-04 NOTE — Progress Notes (Addendum)
Advanced Heart Failure Clinic Note   Date:  08/06/2022   ID:  [name], DOB 1951-09-09, MRN [id number]  Location: Home  Provider location: [facility] Type of Visit: Established patient  PCP:  [name], MD  Primary Cardiologist:  [name], MD HF Cardiologist: Bensimhon  Chief Complaint: Heart Failure follow-up    HPI:  Mr Man is a 71 y.o. with morbid obesity, chronic systolic heart failure, COPD, OSA, PAF, previously on amio-> stopped 2018,  HTN, LBBB, Medtronic CRT-P, and former smoker.   He has h/o NICM which seems to date back to 2014. Had cardiac cath in 1/20 with no CAD and well compensated filling pressures.    Admitted in 1/20 for CRT-P device in setting of LBBB. Intially he felt a lot better but has gradually declined.   Echo 01/30/20 EF 40-45% (read as 35-40%)  Echo EF 2/22 45% (in some images 45-50%) Limited by septal paradox.   Got COVID again in 1/22 but was a mild case.   Had recurrent AF. Underwent DC-CV 8/22.   Follow up 5/23, NYHA I-II, fluid stable. Echo 04/12/22 showed EF 50-55%, LV dyysynchrony, grade I DD,  RV ok.   Today he returns for an acute visit with increase AF burden on device. Overall feeling poorly. More fatigued and SOB with little activity. He is dizzy and feels bloated. He has not been able to work much this past week. Denies palpitations, abnormal bleeding, CP, or PND/Orthopnea. Appetite ok. No fever or chills. Weight at home 302 pounds. Taking all medications. Wears CPAP. Took extra lasix last week. Works full time in Personal assistant. Has been working out 2x/week and has lost 40 lbs over the last several months.   Cardiac Studies  - Echo (5/23): EF 50-55%, severe LV dyysynchrony, grade I DD< RV ok,   - Echo (2/22): EF45%  - Echo (3/21): EF 40-45% (read as 35-40%).  - CPX (09/18/19):  Limited by obesity and mild heart failure Peak VO2 15.1  (80% predicted peak VO2) - when corrected to ibw  pVO2 27.5 Slope 33 RER 1.02    - L/RHC (1/20) No coronary disease.  RA 13 PA 37/20 (27)  PCWP 14 CO 8.5 CI 3.3    - Echo (11/20): RV normal EF 45-50%   - Echo (11/19): EF 35-40% Grade II DD      Past Medical History:  Diagnosis Date   Atrial fibrillation with rapid ventricular response ([place]) 08/02/2013   CHF (congestive heart failure) (HCC)    Chronic systolic dysfunction of left ventricle 08/02/2013   BNP greater than 2000    COPD (chronic obstructive pulmonary disease) (HCC)    Moderate by PFTs with response to bronchodilators, May 2014   Erectile dysfunction    Hypertension    Insomnia 08/02/13   Left bundle branch block 08/02/2013   Morbid obesity ([place]) 08/02/2013   OSA (obstructive sleep apnea)    severe OSA with AHI 74/hr now on CPAP at 8cm H2O   Past Surgical History:  Procedure Laterality Date   BIV PACEMAKER INSERTION CRT-P N/A 12/14/2018   Medtronic model [id number] Percepta Quad CRT-P MRI Surescan (serial Number [id number]) device implanted by Dr [name] for CHF and LBBB   CARDIOVERSION N/A 08/06/2013   Procedure: CARDIOVERSION;  Surgeon: Lelon Perla, MD;  Location: [facility] ENDOSCOPY;  Service: Cardiovascular;  Laterality: N/A;  spoke with [name]    CARDIOVERSION N/A 09/03/2013   Procedure: CARDIOVERSION;  Surgeon: [name], MD;  Location[facility] ENDOSCOPY;  Service: Cardiovascular;  Laterality: N/A;   CARDIOVERSION N/A 07/21/2021   Procedure: CARDIOVERSION;  Surgeon: Larey Dresser, MD;  Location: [facility] ENDOSCOPY;  Service: Cardiovascular;  Laterality: N/A;   chronic systolic dysfu     NASAL SEPTUM SURGERY     RIGHT/LEFT HEART CATH AND CORONARY ANGIOGRAPHY N/A 12/04/2018   Procedure: RIGHT/LEFT HEART CATH AND CORONARY ANGIOGRAPHY;  Surgeon: Belva Crome, MD;  Location: [facility] CV LAB;  Service: Cardiovascular;  Laterality: N/A;   TEE WITHOUT CARDIOVERSION N/A 08/06/2013   Procedure: TRANSESOPHAGEAL ECHOCARDIOGRAM (TEE);  Surgeon: Lelon Perla, MD;   Location: [facility] ENDOSCOPY;  Service: Cardiovascular;  Laterality: N/A;   VASECTOMY       Current Outpatient Medications  Medication Sig Dispense Refill   acetaminophen (TYLENOL) 650 MG CR tablet Take 1,300 mg by mouth in the morning and at bedtime. Arthritis strength     ADVAIR DISKUS 250-50 MCG/DOSE AEPB Inhale 1 puff into the lungs 2 (two) times daily.     carvedilol (COREG) 25 MG tablet Take 1 tablet (25 mg total) by mouth 2 (two) times daily with a meal. 180 tablet 3   cholecalciferol (VITAMIN D3) 25 MCG (1000 UT) tablet Take 1,000 Units by mouth daily.     CIALIS 20 MG tablet Take 20 mg by mouth daily as needed for erectile dysfunction.   0   cyclobenzaprine (FLEXERIL) 10 MG tablet Take 10 mg by mouth 3 (three) times daily.     ELIQUIS 5 MG TABS tablet TAKE 1 TABLET(5 MG) BY MOUTH TWICE DAILY 180 tablet 2   empagliflozin (JARDIANCE) 10 MG TABS tablet Take 1 tablet (10 mg total) by mouth daily before breakfast. 90 tablet 3   ENTRESTO 97-103 MG TAKE 1 TABLET BY MOUTH TWICE DAILY 180 tablet 3   fluticasone (FLONASE) 50 MCG/ACT nasal spray Place 1 spray into both nostrils 2 (two) times daily.   0   furosemide (LASIX) 40 MG tablet TAKE 2 TABLETS(80 MG) BY MOUTH TWICE DAILY 360 tablet 2   Multiple Vitamins-Minerals (PRESERVISION AREDS PO) Take 2 capsules by mouth daily in the afternoon.     omeprazole (PRILOSEC OTC) 20 MG tablet Take 20 mg by mouth daily.     potassium chloride SA (KLOR-CON) 20 MEQ tablet Take 1 tablet (20 mEq total) by mouth daily. 90 tablet 3   spironolactone (ALDACTONE) 25 MG tablet Take 1 tablet (25 mg total) by mouth daily. 90 tablet 1   tamsulosin (FLOMAX) 0.4 MG CAPS capsule Take 0.4 mg by mouth daily.     vitamin B-12 (CYANOCOBALAMIN) 1000 MCG tablet Take 1,000 mcg by mouth daily.     zolpidem (AMBIEN CR) 12.5 MG CR tablet Take 1 tablet by mouth at bedtime.     No current facility-administered medications for this encounter.    Allergies:   Ace inhibitors and  Angiotensin receptor blockers   Social History:  The patient  reports that he quit smoking about 10 years ago. His smoking use included cigarettes and cigars. He has a 64.50 pack-year smoking history. He has never used smokeless tobacco. He reports current alcohol use. He reports that he does not use drugs.   Family History:  The patient's family history includes Atrial fibrillation in his mother; COPD in his father; Cerebral aneurysm in his brother; Healthy in his daughter.   ROS:  Please see the history of present illness.   All other systems are personally reviewed and  negative.   Recent Labs: No results found for requested labs within last 365 days.  Personally reviewed   Wt Readings from Last 3 Encounters:  08/06/22 (!) 138.8 kg (306 lb)  06/03/22 (!) 141.8 kg (312 lb 9.6 oz)  03/29/22 (!) 145.3 kg (320 lb 6.4 oz)    BP 128/76   Pulse 88   Wt (!) 138.8 kg (306 lb)   SpO2 97%   BMI 42.68 kg/m   Physical Exam General:  NAD. No resp difficulty, fatigued-appearing, pale HEENT: Normal Neck: Supple. Thick neck. Carotids 2+ bilat; no bruits. No lymphadenopathy or thryomegaly appreciated. Cor: PMI nondisplaced. Irregular rate & rhythm. No rubs, gallops or murmurs. Lungs: Clear Abdomen: Obese, soft, nontender, nondistended. No hepatosplenomegaly. No bruits or masses. Good bowel sounds. Extremities: No cyanosis, clubbing, rash, edema Neuro: Alert & oriented x 3, cranial nerves grossly intact. Moves all 4 extremities w/o difficulty. Affect pleasant.  ECG (personally reviewed): AF 90 bpm  Device interrogation (personally reviewed): OptiVol up since end of July, thoracic impedence at threshold, new AF/AT, 4 hrs daily activity, pacing down.  ASSESSMENT AND PLAN: 1. Acute on Chronic Combined Systolic/Diastolic HF - NICM (thought due to LBBB). 12/2018 LHC no coronary disease.  - s/p Medtronic CRT-P - Echo (1/19) EF 35-40%. ECHO 11/19 EF 45-50%.  - Echo (11/20): EF read as 35-40% but  with Definity I felt closed to 45% - Echo (3/21): EF read as 35-40% I think 40-45% - Echo EF (2/22): EF 45% (in some images 45-50%) Limited by septal paradox. - CPX test (10/20) suboptimal effort mostly limited to obesity  - Echo (5/23) EF 50-55%, LV dyysynchrony, grade I DD, RV ok.  - Worse NYHA III-IIIb, volume up on device. Likely due to recurrent AF. - ICD interrogation done personally. Results as above - Take metolazone 2.5 mg + 40 KCL x 1 with this afternoon's dose of Lasix. - Continue Lasix 80 mg bid. - Continue carvedilol 25 mg bid. - Continue Entresto 97-103 mg bid. - Continue spiro 25 mg daily. - Continue Jadiance 10 mg daily. - Labs today. - Will ask Sharman Cheek to send transmission next week to follow OptiVol.   2. PAF  - In the past he was on amiodarone. Stopped ~2017 - Had recurrent AF. Underwent DC-CV 8/22. Followed by AF Clinic.  - Increased AF burden on device interrogation with worsening NYHA III symptoms. - AF on ECG today. - CHA2DS2Vasc is 3. - Continue Eliquis 5 bid. No bleeding. He has not missed any doses. - Discussed DCCV vs re-challenge of amiodarone.  - Diurese as above. We discussed risk/benefits of DCCV and he is agreeable, will arrange with Dr. Haroldine Laws. Will discuss with Dr. Haroldine Laws.  - Refer back to AF clinic to discuss anti-arrhythmic vs ablation consideration (size may be prohibitive). He has appt on Monday. - CBC today.   3. OSA - Continue CPAP.   4. Obesity -  Body mass index is 42.68 kg/m.  -  Continue weight loss efforts.  -  Insurance denied [employer].  5. COPD  - No longer smoking.  6. CKD 3a - Last SCr 1.34 - Continue SGLT2i. - BMET today.  Follow up with Dr. Haroldine Laws as scheduled. He has follow up next week with AF clinic.  [place], FNP  08/06/2022 9:02 AM  Advanced Heart Failure [place] [address] [facility] and [place] [address] [phone] (office) [phone]  (fax)

## 2022-08-05 ENCOUNTER — Ambulatory Visit
Admission: RE | Admit: 2022-08-05 | Discharge: 2022-08-05 | Disposition: A | Discharge status: Home / Self Care | Payer: 59 | Source: Ambulatory Visit | Attending: Family Medicine | Admitting: Family Medicine

## 2022-08-05 DIAGNOSIS — Z87891 Personal history of nicotine dependence: Secondary | ICD-10-CM

## 2022-08-06 ENCOUNTER — Ambulatory Visit (HOSPITAL_COMMUNITY)
Admission: RE | Admit: 2022-08-06 | Discharge: 2022-08-06 | Disposition: A | Discharge status: Home / Self Care | Payer: 59 | Source: Ambulatory Visit | Attending: Family Medicine | Admitting: Family Medicine

## 2022-08-06 ENCOUNTER — Telehealth: Payer: Self-pay | Admitting: Acute Care

## 2022-08-06 ENCOUNTER — Encounter (HOSPITAL_COMMUNITY): Payer: Self-pay

## 2022-08-06 ENCOUNTER — Telehealth (HOSPITAL_COMMUNITY): Payer: Self-pay

## 2022-08-06 VITALS — BP 128/76 | HR 88 | Wt 306.0 lb

## 2022-08-06 DIAGNOSIS — I5042 Chronic combined systolic (congestive) and diastolic (congestive) heart failure: Secondary | ICD-10-CM

## 2022-08-06 DIAGNOSIS — I428 Other cardiomyopathies: Secondary | ICD-10-CM | POA: Diagnosis not present

## 2022-08-06 DIAGNOSIS — J449 Chronic obstructive pulmonary disease, unspecified: Secondary | ICD-10-CM | POA: Insufficient documentation

## 2022-08-06 DIAGNOSIS — Z7984 Long term (current) use of oral hypoglycemic drugs: Secondary | ICD-10-CM | POA: Diagnosis not present

## 2022-08-06 DIAGNOSIS — Z4502 Encounter for adjustment and management of automatic implantable cardiac defibrillator: Secondary | ICD-10-CM | POA: Insufficient documentation

## 2022-08-06 DIAGNOSIS — Z6841 Body Mass Index (BMI) 40.0 and over, adult: Secondary | ICD-10-CM | POA: Diagnosis not present

## 2022-08-06 DIAGNOSIS — I447 Left bundle-branch block, unspecified: Secondary | ICD-10-CM | POA: Insufficient documentation

## 2022-08-06 DIAGNOSIS — G4733 Obstructive sleep apnea (adult) (pediatric): Secondary | ICD-10-CM | POA: Insufficient documentation

## 2022-08-06 DIAGNOSIS — R42 Dizziness and giddiness: Secondary | ICD-10-CM | POA: Diagnosis not present

## 2022-08-06 DIAGNOSIS — I130 Hypertensive heart and chronic kidney disease with heart failure and stage 1 through stage 4 chronic kidney disease, or unspecified chronic kidney disease: Secondary | ICD-10-CM | POA: Diagnosis not present

## 2022-08-06 DIAGNOSIS — Z7901 Long term (current) use of anticoagulants: Secondary | ICD-10-CM | POA: Insufficient documentation

## 2022-08-06 DIAGNOSIS — Z79899 Other long term (current) drug therapy: Secondary | ICD-10-CM | POA: Insufficient documentation

## 2022-08-06 DIAGNOSIS — I5043 Acute on chronic combined systolic (congestive) and diastolic (congestive) heart failure: Secondary | ICD-10-CM | POA: Insufficient documentation

## 2022-08-06 DIAGNOSIS — Z87891 Personal history of nicotine dependence: Secondary | ICD-10-CM | POA: Insufficient documentation

## 2022-08-06 DIAGNOSIS — I480 Paroxysmal atrial fibrillation: Secondary | ICD-10-CM

## 2022-08-06 DIAGNOSIS — I5023 Acute on chronic systolic (congestive) heart failure: Secondary | ICD-10-CM | POA: Diagnosis not present

## 2022-08-06 DIAGNOSIS — R918 Other nonspecific abnormal finding of lung field: Secondary | ICD-10-CM

## 2022-08-06 DIAGNOSIS — R140 Abdominal distension (gaseous): Secondary | ICD-10-CM | POA: Diagnosis not present

## 2022-08-06 DIAGNOSIS — N183 Chronic kidney disease, stage 3 unspecified: Secondary | ICD-10-CM

## 2022-08-06 LAB — BASIC METABOLIC PANEL
Anion gap: 11 (ref 5–15)
BUN: 18 mg/dL (ref 8–23)
CO2: 26 mmol/L (ref 22–32)
Calcium: 8.6 mg/dL — ABNORMAL LOW (ref 8.9–10.3)
Chloride: 102 mmol/L (ref 98–111)
Creatinine, Ser: 1.51 mg/dL — ABNORMAL HIGH (ref 0.61–1.24)
GFR, Estimated: 50 mL/min — ABNORMAL LOW (ref >60)
Glucose, Bld: 120 mg/dL — ABNORMAL HIGH (ref 70–99)
Potassium: 3.1 mmol/L — ABNORMAL LOW (ref 3.5–5.1)
Sodium: 139 mmol/L (ref 135–145)

## 2022-08-06 LAB — CBC
HCT: 44.9 % (ref 39.0–52.0)
Hemoglobin: 14.7 g/dL (ref 13.0–17.0)
MCH: 30.8 pg (ref 26.0–34.0)
MCHC: 32.7 g/dL (ref 30.0–36.0)
MCV: 94.1 fL (ref 80.0–100.0)
Platelets: 246 10*3/uL (ref 150–400)
RBC: 4.77 MIL/uL (ref 4.22–5.81)
RDW: 13.1 % (ref 11.5–15.5)
WBC: 7.5 10*3/uL (ref 4.0–10.5)
nRBC: 0 % (ref 0.0–0.2)

## 2022-08-06 LAB — BRAIN NATRIURETIC PEPTIDE: B Natriuretic Peptide: 104.5 pg/mL — ABNORMAL HIGH (ref 0.0–100.0)

## 2022-08-06 MED ORDER — METOLAZONE 2.5 MG PO TABS: 2.5000 mg | ORAL_TABLET | Freq: Once | ORAL | 0 refills | Status: DC

## 2022-08-06 MED ORDER — AZITHROMYCIN 250 MG PO TABS: ORAL_TABLET | ORAL | 0 refills | Status: DC

## 2022-08-06 MED ORDER — POTASSIUM CHLORIDE CRYS ER 20 MEQ PO TBCR: 40.0000 meq | EXTENDED_RELEASE_TABLET | Freq: Every day | ORAL | 3 refills | Status: DC

## 2022-08-06 NOTE — Telephone Encounter (Signed)
Patient advised and verbalized understanding. Med list updated to reflect changes. Patient has appt with afib clinic next week, will recheck labs at that time, lab order entered.   Meds ordered this encounter  Medications   potassium chloride SA (KLOR-CON M) 20 MEQ tablet    Sig: Take 2 tablets (40 mEq total) by mouth daily.    Dispense:  180 tablet    Refill:  3    Please cancel all previous orders for current medication. Change in dosage or pill size.   Orders Placed This Encounter  Procedures   Basic metabolic panel    Standing Status:   Future    Standing Expiration Date:   08/07/2023    Order Specific Question:   Release to patient    Answer:   Immediate

## 2022-08-06 NOTE — Telephone Encounter (Signed)
-----   Message from Rafael Bihari, [place] sent at 08/06/2022 12:39 PM EDT ----- K is low. Take extra 40 KCL today, then increase daily KCL to 40 daily.  He needs repeat BMET in 1 week

## 2022-08-06 NOTE — Telephone Encounter (Signed)
IMPRESSION: 1. Lung-RADS 4B, suspicious. Additional imaging evaluation or consultation with Pulmonology or Thoracic Surgery recommended. 2. Aortic Atherosclerosis (ICD10-I70.0) and Emphysema (ICD10-J43.9).   These results will be called to the ordering clinician or representative by the Radiologist Assistant, and communication documented in the PACS or [employer].  CT results forwarded to Dr Vaughan Browner. Per Dr Matilde Bash recommendations please call in [place] take as directed and order a PET scan.   I called pt and discussed CT results and Dr Matilde Bash recommendations. Pt stated that he has had some cough and congestion in left side of chest. He also stated that he has been in Afib for a week now and has had increase in fluid build up. He is currently on Lasix and Eliquis with Zaroxolyn being added today. He will be scheduled for an ablation next week. He wanted to make sure that the Zpak would not interfere with any of the other treatment. I message Dr Vaughan Browner to advise and his recommendation was that it is ok for pt to take Zpak.  Called pt and advised that Zpak was sent to [facility] and that he would receive a call on Monday regarding the PET scan. Pt verbalized understanding and had no further questions.

## 2022-08-06 NOTE — Patient Instructions (Signed)
Thank you for coming in today  Labs were done today, if any labs are abnormal the clinic will call you No news is good news  TAKE Metolazone 2.5 mg with afternoon lasix dose 1 tablet once only today. You were given extra pills please do not take them unless instructed by the Heart Failure provider to do so.  DO NOT MISS ANY DOSES OF ELIQUIS    You are scheduled for a Cardioversion on 08/18/2022 with Dr. Haroldine Laws.  Please arrive at the [facility] (Main Entrance A) at [facility]: [address] [place], [place] [address] at 7 am. (1 hour prior to procedure unless lab work is needed; if lab work is needed arrive 1.5 hours ahead)  DIET: Nothing to eat or drink after midnight except a sip of water with medications (see medication instructions below)  Medication Instructions:   Continue your anticoagulant: Eliquis You will need to continue your anticoagulant after your procedure until you  are told by your  Provider that it is safe to stop   Labs: If patient is on Coumadin, patient needs pt/INR, CBC, BMET within 3 days (No pt/INR needed for patients taking Xarelto, Eliquis, Pradaxa) For patients receiving anesthesia for TEE and all Cardioversion patients: BMET, CBC within 1 week   You must have a responsible person to drive you home and stay in the waiting area during your procedure. Failure to do so could result in cancellation.  Bring your insurance cards.  *Special Note: Every effort is made to have your procedure done on time. Occasionally there are emergencies that occur at the hospital that may cause delays. Please be patient if a delay does occur.      Do the following things EVERYDAY: Weigh yourself in the morning before breakfast. Write it down and keep it in a log. Take your medicines as prescribed Eat low salt foods--Limit salt (sodium) to 2000 mg per day.  Stay as active as you can everyday Limit all fluids for the day to less than 2 liters   At the [facility], you and your health needs are our priority. As part of our continuing mission to provide you with exceptional heart care, we have created designated Provider Care Teams. These Care Teams include your primary Cardiologist (physician) and Advanced Practice Providers (APPs- Physician Assistants and Nurse Practitioners) who all work together to provide you with the care you need, when you need it.   You may see any of the following providers on your designated Care Team at your next follow up: Dr Glori Bickers Dr Loralie Champagne Dr. Roxana Hires, NP Lyda Jester, [place] [facility] [place], [place] Forestine Na, NP Audry Riles, PharmD   Please be sure to bring in all your medications bottles to every appointment.   If you have any questions or concerns before your next appointment please send [place] a message through [place] or call our office at [phone].    TO LEAVE A MESSAGE FOR THE NURSE SELECT OPTION 2, PLEASE LEAVE A MESSAGE INCLUDING: YOUR NAME DATE OF BIRTH CALL BACK NUMBER REASON FOR CALL**this is important as we prioritize the call backs  YOU WILL RECEIVE A CALL BACK THE SAME DAY AS LONG AS YOU CALL BEFORE 4:00 PM

## 2022-08-09 ENCOUNTER — Ambulatory Visit (INDEPENDENT_AMBULATORY_CARE_PROVIDER_SITE_OTHER): Payer: 59

## 2022-08-09 ENCOUNTER — Ambulatory Visit (HOSPITAL_COMMUNITY)
Admission: RE | Admit: 2022-08-09 | Discharge: 2022-08-09 | Disposition: A | Discharge status: Home / Self Care | Payer: 59 | Source: Ambulatory Visit | Attending: Nurse Practitioner | Admitting: Nurse Practitioner

## 2022-08-09 VITALS — BP 104/72 | HR 82 | Ht 71.0 in | Wt 303.2 lb

## 2022-08-09 DIAGNOSIS — I5042 Chronic combined systolic (congestive) and diastolic (congestive) heart failure: Secondary | ICD-10-CM | POA: Insufficient documentation

## 2022-08-09 DIAGNOSIS — I447 Left bundle-branch block, unspecified: Secondary | ICD-10-CM | POA: Diagnosis not present

## 2022-08-09 DIAGNOSIS — I110 Hypertensive heart disease with heart failure: Secondary | ICD-10-CM | POA: Diagnosis not present

## 2022-08-09 DIAGNOSIS — Z87891 Personal history of nicotine dependence: Secondary | ICD-10-CM | POA: Diagnosis not present

## 2022-08-09 DIAGNOSIS — I4819 Other persistent atrial fibrillation: Secondary | ICD-10-CM | POA: Insufficient documentation

## 2022-08-09 DIAGNOSIS — G4733 Obstructive sleep apnea (adult) (pediatric): Secondary | ICD-10-CM | POA: Diagnosis not present

## 2022-08-09 DIAGNOSIS — I480 Paroxysmal atrial fibrillation: Secondary | ICD-10-CM | POA: Diagnosis not present

## 2022-08-09 DIAGNOSIS — Z8616 Personal history of COVID-19: Secondary | ICD-10-CM | POA: Diagnosis not present

## 2022-08-09 DIAGNOSIS — J449 Chronic obstructive pulmonary disease, unspecified: Secondary | ICD-10-CM | POA: Diagnosis not present

## 2022-08-09 DIAGNOSIS — Z6841 Body Mass Index (BMI) 40.0 and over, adult: Secondary | ICD-10-CM | POA: Insufficient documentation

## 2022-08-09 DIAGNOSIS — D6869 Other thrombophilia: Secondary | ICD-10-CM | POA: Diagnosis not present

## 2022-08-09 DIAGNOSIS — I5023 Acute on chronic systolic (congestive) heart failure: Secondary | ICD-10-CM

## 2022-08-09 LAB — BASIC METABOLIC PANEL
Anion gap: 11 (ref 5–15)
BUN: 33 mg/dL — ABNORMAL HIGH (ref 8–23)
CO2: 28 mmol/L (ref 22–32)
Calcium: 9.2 mg/dL (ref 8.9–10.3)
Chloride: 100 mmol/L (ref 98–111)
Creatinine, Ser: 1.68 mg/dL — ABNORMAL HIGH (ref 0.61–1.24)
GFR, Estimated: 44 mL/min — ABNORMAL LOW (ref >60)
Glucose, Bld: 131 mg/dL — ABNORMAL HIGH (ref 70–99)
Potassium: 4.2 mmol/L (ref 3.5–5.1)
Sodium: 139 mmol/L (ref 135–145)

## 2022-08-09 NOTE — Patient Instructions (Signed)
Cardioversion scheduled for Friday, September 18th  - Arrive at the [facility] and go to admitting at [facility] not eat or drink anything after midnight the night prior to your procedure.  - Take all your morning medication (except diabetic medications) with a sip of water prior to arrival.  - You will not be able to drive home after your procedure.  - Do NOT miss any doses of your blood thinner - if you should miss a dose please notify our office immediately.  - If you feel as if you go back into normal rhythm prior to scheduled cardioversion, please notify our office immediately. If your procedure is canceled in the cardioversion suite you will be charged a cancellation fee.

## 2022-08-09 NOTE — H&P (View-Only) (Signed)
Primary Care Physician: [name], MD Referring Physician: Device clinic EP: Dr. [name] Cardiologist: Dr. Faythe Casa Nicholas Foley- Dr. [name] is a 71 y.o. male with a h/o  of NICM, Chronic CHF (systolic=> EF 99%, 01/7168) ), COPD (prior smoker), OSA (w/CPAP), AFib, HTN, LBBB, CRT-P, morbid obesity, prior amiodarone therapy (DC 2018), and prior smoker. Suffered COVID-19 infection in December 2020.  He is in the afib clinic per the device clinic, the pt has been noted to be in afib since 8/18. He had recently been on an an antibiotic and  prednisone taper for  f/u long haul Covid  and a "spot on his lung." He feels he has retained fluid during this time. He has been instructed to take an extra lasix. Had previously been treated on Amiodarone, per Epic,  but pt does not remember this drug. EKG today shows  afib at 88 bpm. He feels very fatigued with afib. Recommendation from the long Covid clinic at [facility] was exercise and he has been able to regain a lot of his strength through regular exercsie program and  working with a Physiological scientist. HE was feeling really good until recent with return of afib. He is anxious to get back to exercise and get his energy back. No missed anticoagulation for a CHA2DS2VASc score of 4.   F/u in afib clinic, 07/28/21. He had successful cardioversion and remains in SR today. He has felt really good until yesterday and today. His fluid status is stable, actually done a few lbs. He states with long Covid, at times will have days he does not feel as well.   Follow up in the AF clinic 08/09/22. Patient reports that despite the metolazone dose and his weight decreasing he is still very symptomatic with his afib. He has dyspnea with exertion and lightheadedness. No bleeding issues on anticoagulation.   Today, he denies symptoms of chest pain, orthopnea, PND, lower extremity edema, dizziness, presyncope, syncope, or neurologic sequela. The patient is tolerating medications  without difficulties and is otherwise without complaint today.   Past Medical History:  Diagnosis Date   Atrial fibrillation with rapid ventricular response ([place]) 08/02/2013   CHF (congestive heart failure) (HCC)    Chronic systolic dysfunction of left ventricle 08/02/2013   BNP greater than 2000    COPD (chronic obstructive pulmonary disease) (HCC)    Moderate by PFTs with response to bronchodilators, May 2014   Erectile dysfunction    Hypertension    Insomnia 08/02/13   Left bundle branch block 08/02/2013   Morbid obesity ([place]) 08/02/2013   OSA (obstructive sleep apnea)    severe OSA with AHI 74/hr now on CPAP at 8cm H2O   Past Surgical History:  Procedure Laterality Date   BIV PACEMAKER INSERTION CRT-P N/A 12/14/2018   Medtronic model [id number] Percepta Quad CRT-P MRI Surescan (serial Number [id number]) device implanted by Dr [name] for CHF and LBBB   CARDIOVERSION N/A 08/06/2013   Procedure: CARDIOVERSION;  Surgeon: Lelon Perla, MD;  [facility] ENDOSCOPY;  Service: Cardiovascular;  Laterality: N/A;  spoke with [name]    CARDIOVERSION N/A 09/03/2013   Procedure: CARDIOVERSION;  Surgeon: [name], MD;  Location: [facility] ENDOSCOPY;  Service: Cardiovascular;  Laterality: N/A;   CARDIOVERSION N/A 07/21/2021   Procedure: CARDIOVERSION;  Surgeon: Larey Dresser, MD;  Location: [facility] ENDOSCOPY;  Service: Cardiovascular;  Laterality: N/A;   chronic systolic dysfu     NASAL SEPTUM SURGERY     RIGHT/LEFT  HEART CATH AND CORONARY ANGIOGRAPHY N/A 12/04/2018   Procedure: RIGHT/LEFT HEART CATH AND CORONARY ANGIOGRAPHY;  Surgeon: Belva Crome, MD;  Location: [place] CV LAB;  Service: Cardiovascular;  Laterality: N/A;   TEE WITHOUT CARDIOVERSION N/A 08/06/2013   Procedure: TRANSESOPHAGEAL ECHOCARDIOGRAM (TEE);  Surgeon: Lelon Perla, MD;  Location: [facility] ENDOSCOPY;  Service: Cardiovascular;  Laterality: N/A;   VASECTOMY      Current Outpatient Medications  Medication Sig Dispense Refill    acetaminophen (TYLENOL) 650 MG CR tablet Take 1,300 mg by mouth in the morning and at bedtime. Arthritis strength     ADVAIR DISKUS 250-50 MCG/DOSE AEPB Inhale 1 puff into the lungs 2 (two) times daily.     azithromycin (ZITHROMAX) 250 MG tablet Take 2 tablets today, then 1 tablet daily until gone. 6 tablet 0   carvedilol (COREG) 25 MG tablet Take 1 tablet (25 mg total) by mouth 2 (two) times daily with a meal. 180 tablet 3   cholecalciferol (VITAMIN D3) 25 MCG (1000 UT) tablet Take 1,000 Units by mouth daily.     CIALIS 20 MG tablet Take 20 mg by mouth daily as needed for erectile dysfunction.   0   cyclobenzaprine (FLEXERIL) 10 MG tablet Take 10 mg by mouth 3 (three) times daily.     ELIQUIS 5 MG TABS tablet TAKE 1 TABLET(5 MG) BY MOUTH TWICE DAILY 180 tablet 2   empagliflozin (JARDIANCE) 10 MG TABS tablet Take 1 tablet (10 mg total) by mouth daily before breakfast. 90 tablet 3   ENTRESTO 97-103 MG TAKE 1 TABLET BY MOUTH TWICE DAILY 180 tablet 3   fluticasone (FLONASE) 50 MCG/ACT nasal spray Place 1 spray into both nostrils 2 (two) times daily.   0   furosemide (LASIX) 40 MG tablet TAKE 2 TABLETS(80 MG) BY MOUTH TWICE DAILY 360 tablet 2   Multiple Vitamins-Minerals (PRESERVISION AREDS PO) Take 2 capsules by mouth daily in the afternoon.     omeprazole (PRILOSEC OTC) 20 MG tablet Take 20 mg by mouth daily.     potassium chloride SA (KLOR-CON M) 20 MEQ tablet Take 2 tablets (40 mEq total) by mouth daily. 180 tablet 3   spironolactone (ALDACTONE) 25 MG tablet Take 1 tablet (25 mg total) by mouth daily. 90 tablet 1   tamsulosin (FLOMAX) 0.4 MG CAPS capsule Take 0.4 mg by mouth daily.     vitamin B-12 (CYANOCOBALAMIN) 1000 MCG tablet Take 1,000 mcg by mouth daily.     zolpidem (AMBIEN CR) 12.5 MG CR tablet Take 1 tablet by mouth at bedtime.     metolazone (ZAROXOLYN) 2.5 MG tablet Take 1 tablet (2.5 mg total) by mouth once for 1 dose. (Patient not taking: Reported on 08/09/2022) 5 tablet 0   No  current facility-administered medications for this encounter.    Allergies  Allergen Reactions   Ace Inhibitors Cough   Angiotensin Receptor Blockers Cough    Social History   Socioeconomic History   Marital status: Married    Spouse name: Not on file   Number of children: Not on file   Years of education: Not on file   Highest education level: Not on file  Occupational History   Occupation: real estate    Comment: investment  Tobacco Use   Smoking status: Former    Packs/day: 1.50    Years: 43.00    Total pack years: 64.50    Types: Cigarettes, Cigars    Quit date: 08/02/2012    Years since quitting:  10.0   Smokeless tobacco: Never  Vaping Use   Vaping Use: Never used  Substance and Sexual Activity   Alcohol use: Yes    Alcohol/week: 0.0 standard drinks of alcohol    Comment: once a week; previously 3-4 times per week    Drug use: No   Sexual activity: Yes  Other Topics Concern   Not on file  Social History Narrative   Works as a Engineer, structural for apartments   Lives with wife.  They have one grown daughter   Highest level of education:  college   Social Determinants of Health   Financial Resource Strain: Not on file  Food Insecurity: Not on file  Transportation Needs: Not on file  Physical Activity: Not on file  Stress: Not on file  Social Connections: Not on file  Intimate Partner Violence: Not on file    Family History  Problem Relation Age of Onset   Atrial fibrillation Mother    COPD Father    Cerebral aneurysm Brother    Healthy Daughter     ROS- All systems are reviewed and negative except as per the HPI above  Physical Exam: Vitals:   08/09/22 1459  BP: 104/72  Pulse: 82  Weight: (!) 137.5 kg  Height: '5\' 11"'$  (1.803 m)    Wt Readings from Last 3 Encounters:  08/09/22 (!) 137.5 kg  08/06/22 (!) 138.8 kg  06/03/22 (!) 141.8 kg    Labs: Lab Results  Component Value Date   NA 139 08/06/2022   K 3.1 (L) 08/06/2022   CL 102  08/06/2022   CO2 26 08/06/2022   GLUCOSE 120 (H) 08/06/2022   BUN 18 08/06/2022   CREATININE 1.51 (H) 08/06/2022   CALCIUM 8.6 (L) 08/06/2022   MG 2.1 01/15/2020   Lab Results  Component Value Date   INR 1.2 11/17/2019   Lab Results  Component Value Date   CHOL 184 08/03/2013   HDL 50 08/03/2013   LDLCALC 104 (H) 08/03/2013   TRIG 149 08/03/2013     GEN- The patient is a well appearing obese male, alert and oriented x 3 today.   HEENT-head normocephalic, atraumatic, sclera clear, conjunctiva pink, hearing intact, trachea midline. Lungs- Clear to ausculation bilaterally, normal work of breathing Heart- Regular rate and rhythm, no murmurs, rubs or gallops  GI- soft, NT, ND, + BS Extremities- no clubbing, cyanosis, or edema MS- no significant deformity or atrophy Skin- no rash or lesion Psych- euthymic mood, full affect Neuro- strength and sensation are intact   EKG-  V pacing with underlying afib Vent. rate 82 BPM PR interval * ms QRS duration 136 ms QT/QTcB 432/504 ms   Assessment and Plan:  1. Persistent afib  Scheduled for DCCV on 08/18/22, patient does not feel he can wait this long. Got his DCCV moved up to 08/13/22. We discussed ED precautions.  Long term, we discussed rhythm control options including AAD and ablation. Would avoid class IC and Multaq with h/o CHF. His QT may be too long for dofetilide (device is not ICD). Amiodarone may be his best AAD option. With his significant weight loss he may be a candidate for ablation. He is agreeable to discussing with EP.   Continue carvedilol 25 mg BID Continue Eliquis 5 mg BID  2. CHA2DS2VASc  score of 4 Continue Eliquis 5 mg BID   3. Chronic combined systolic and diastolic heart failure  Followed in the [facility] Weight down 3 lbs since 9/15 Check bmet  today.   4. Obesity Body mass index is 42.29 kg/m. Lifestyle modification was discussed and encouraged including regular physical activity and weight  reduction. Patient doing well with diet and exercise and has lost >40 lbs.   5. OSA The importance of adequate treatment of sleep apnea was discussed today in order to improve our ability to maintain sinus rhythm long term. Encouraged compliance with CPAP therapy.    Follow up with Dr Haroldine Laws post DCCV and with EP to establish care.    [facility] [address] [place], [place] [address] [phone]

## 2022-08-09 NOTE — Progress Notes (Signed)
Primary Care Physician: [name], MD Referring Physician: Device clinic EP: Dr. [name] Cardiologist: Dr. 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 Suffered COVID-19 infection in December 2020.  He is in the afib clinic per the device clinic, the pt has been noted to be in afib since 8/18. He had recently been on an an antibiotic and  prednisone taper for  f/u long haul Covid  and a "spot on his lung." He feels he has retained fluid during this time. He has been instructed to take an extra lasix. Had previously been treated on Amiodarone, per Epic,  but pt does not remember this drug. EKG today shows  afib at 88 bpm. He feels very fatigued with afib. Recommendation from the long Covid clinic at [facility] was exercise and he has been able to regain a lot of his strength through regular exercsie program and  working with a Physiological scientist. HE was feeling really good until recent with return of afib. He is anxious to get back to exercise and get his energy back. No missed anticoagulation for a CHA2DS2VASc score of 4.   F/u in afib clinic, 07/28/21. He had successful cardioversion and remains in SR today. He has felt really good until yesterday and today. His fluid status is stable, actually done a few lbs. He states with long Covid, at times will have days he does not feel as well.   Follow up in the AF clinic 08/09/22. Patient reports that despite the metolazone dose and his weight decreasing he is still very symptomatic with his afib. He has dyspnea with exertion and lightheadedness. No bleeding issues on anticoagulation.   Today, he denies symptoms of chest pain, orthopnea, PND, lower extremity edema, dizziness, presyncope, syncope, or neurologic sequela. The patient is tolerating medications  without difficulties and is otherwise without complaint today.   Past Medical History:  Diagnosis Date   Atrial fibrillation with rapid ventricular response ([place]) 08/02/2013   CHF (congestive heart failure) (HCC)    Chronic systolic dysfunction of left ventricle 08/02/2013   BNP greater than 2000    COPD (chronic obstructive pulmonary disease) (HCC)    Moderate by PFTs with response to bronchodilators, May 2014   Erectile dysfunction    Hypertension    Insomnia 08/02/13   Left bundle branch block 08/02/2013   Morbid obesity ([place]) 08/02/2013   OSA (obstructive sleep apnea)    severe OSA with AHI 74/hr now on CPAP at 8cm H2O   Past Surgical History:  Procedure Laterality Date   BIV PACEMAKER INSERTION CRT-P N/A 12/14/2018   Medtronic model [id number] Percepta Quad CRT-P MRI Surescan (serial Number [id number]) device implanted by Dr [name] for CHF and LBBB   CARDIOVERSION N/A 08/06/2013   Procedure: CARDIOVERSION;  Surgeon: Lelon Perla, MD;  Location: [facility] ENDOSCOPY;  Service: Cardiovascular;  Laterality: N/A;  spoke with [name]    CARDIOVERSION N/A 09/03/2013   Procedure: CARDIOVERSION;  Surgeon: [name], MD;  Location: [facility] ENDOSCOPY;  Service: Cardiovascular;  Laterality: N/A;   CARDIOVERSION N/A 07/21/2021   Procedure: CARDIOVERSION;  Surgeon: Larey Dresser, MD;  Location: [facility] ENDOSCOPY;  Service: Cardiovascular;  Laterality: N/A;   chronic systolic dysfu     NASAL SEPTUM SURGERY     RIGHT/LEFT  HEART CATH AND CORONARY ANGIOGRAPHY N/A 12/04/2018   Procedure: RIGHT/LEFT HEART CATH AND CORONARY ANGIOGRAPHY;  Surgeon: Belva Crome, MD;  Location: [place] CV LAB;  Service: Cardiovascular;  Laterality: N/A;   TEE WITHOUT CARDIOVERSION N/A 08/06/2013   Procedure: TRANSESOPHAGEAL ECHOCARDIOGRAM (TEE);  Surgeon: Lelon Perla, MD;  Location: [facility] ENDOSCOPY;  Service: Cardiovascular;  Laterality: N/A;   VASECTOMY      Current Outpatient Medications  Medication Sig Dispense Refill    acetaminophen (TYLENOL) 650 MG CR tablet Take 1,300 mg by mouth in the morning and at bedtime. Arthritis strength     ADVAIR DISKUS 250-50 MCG/DOSE AEPB Inhale 1 puff into the lungs 2 (two) times daily.     azithromycin (ZITHROMAX) 250 MG tablet Take 2 tablets today, then 1 tablet daily until gone. 6 tablet 0   carvedilol (COREG) 25 MG tablet Take 1 tablet (25 mg total) by mouth 2 (two) times daily with a meal. 180 tablet 3   cholecalciferol (VITAMIN D3) 25 MCG (1000 UT) tablet Take 1,000 Units by mouth daily.     CIALIS 20 MG tablet Take 20 mg by mouth daily as needed for erectile dysfunction.   0   cyclobenzaprine (FLEXERIL) 10 MG tablet Take 10 mg by mouth 3 (three) times daily.     ELIQUIS 5 MG TABS tablet TAKE 1 TABLET(5 MG) BY MOUTH TWICE DAILY 180 tablet 2   empagliflozin (JARDIANCE) 10 MG TABS tablet Take 1 tablet (10 mg total) by mouth daily before breakfast. 90 tablet 3   ENTRESTO 97-103 MG TAKE 1 TABLET BY MOUTH TWICE DAILY 180 tablet 3   fluticasone (FLONASE) 50 MCG/ACT nasal spray Place 1 spray into both nostrils 2 (two) times daily.   0   furosemide (LASIX) 40 MG tablet TAKE 2 TABLETS(80 MG) BY MOUTH TWICE DAILY 360 tablet 2   Multiple Vitamins-Minerals (PRESERVISION AREDS PO) Take 2 capsules by mouth daily in the afternoon.     omeprazole (PRILOSEC OTC) 20 MG tablet Take 20 mg by mouth daily.     potassium chloride SA (KLOR-CON M) 20 MEQ tablet Take 2 tablets (40 mEq total) by mouth daily. 180 tablet 3   spironolactone (ALDACTONE) 25 MG tablet Take 1 tablet (25 mg total) by mouth daily. 90 tablet 1   tamsulosin (FLOMAX) 0.4 MG CAPS capsule Take 0.4 mg by mouth daily.     vitamin B-12 (CYANOCOBALAMIN) 1000 MCG tablet Take 1,000 mcg by mouth daily.     zolpidem (AMBIEN CR) 12.5 MG CR tablet Take 1 tablet by mouth at bedtime.     metolazone (ZAROXOLYN) 2.5 MG tablet Take 1 tablet (2.5 mg total) by mouth once for 1 dose. (Patient not taking: Reported on 08/09/2022) 5 tablet 0   No  current facility-administered medications for this encounter.    Allergies  Allergen Reactions   Ace Inhibitors Cough   Angiotensin Receptor Blockers Cough    Social History   Socioeconomic History   Marital status: Married    Spouse name: Not on file   Number of children: Not on file   Years of education: Not on file   Highest education level: Not on file  Occupational History   Occupation: real estate    Comment: investment  Tobacco Use   Smoking status: Former    Packs/day: 1.50    Years: 43.00    Total pack years: 64.50    Types: Cigarettes, Cigars    Quit date: 08/02/2012    Years since quitting:  10.0   Smokeless tobacco: Never  Vaping Use   Vaping Use: Never used  Substance and Sexual Activity   Alcohol use: Yes    Alcohol/week: 0.0 standard drinks of alcohol    Comment: once a week; previously 3-4 times per week    Drug use: No   Sexual activity: Yes  Other Topics Concern   Not on file  Social History Narrative   Works as a Engineer, structural for apartments   Lives with wife.  They have one grown daughter   Highest level of education:  college   Social Determinants of Health   Financial Resource Strain: Not on file  Food Insecurity: Not on file  Transportation Needs: Not on file  Physical Activity: Not on file  Stress: Not on file  Social Connections: Not on file  Intimate Partner Violence: Not on file    Family History  Problem Relation Age of Onset   Atrial fibrillation Mother    COPD Father    Cerebral aneurysm Brother    Healthy Daughter     ROS- All systems are reviewed and negative except as per the HPI above  Physical Exam: Vitals:   08/09/22 1459  BP: 104/72  Pulse: 82  Weight: (!) 137.5 kg  Height: '5\' 11"'$  (1.803 m)    Wt Readings from Last 3 Encounters:  08/09/22 (!) 137.5 kg  08/06/22 (!) 138.8 kg  06/03/22 (!) 141.8 kg    Labs: Lab Results  Component Value Date   NA 139 08/06/2022   K 3.1 (L) 08/06/2022   CL 102  08/06/2022   CO2 26 08/06/2022   GLUCOSE 120 (H) 08/06/2022   BUN 18 08/06/2022   CREATININE 1.51 (H) 08/06/2022   CALCIUM 8.6 (L) 08/06/2022   MG 2.1 01/15/2020   Lab Results  Component Value Date   INR 1.2 11/17/2019   Lab Results  Component Value Date   CHOL 184 08/03/2013   HDL 50 08/03/2013   LDLCALC 104 (H) 08/03/2013   TRIG 149 08/03/2013     GEN- The patient is a well appearing obese male, alert and oriented x 3 today.   HEENT-head normocephalic, atraumatic, sclera clear, conjunctiva pink, hearing intact, trachea midline. Lungs- Clear to ausculation bilaterally, normal work of breathing Heart- Regular rate and rhythm, no murmurs, rubs or gallops  GI- soft, NT, ND, + BS Extremities- no clubbing, cyanosis, or edema MS- no significant deformity or atrophy Skin- no rash or lesion Psych- euthymic mood, full affect Neuro- strength and sensation are intact   EKG-  V pacing with underlying afib Vent. rate 82 BPM PR interval * ms QRS duration 136 ms QT/QTcB 432/504 ms   Assessment and Plan:  1. Persistent afib  Scheduled for DCCV on 08/18/22, patient does not feel he can wait this long. Got his DCCV moved up to 08/13/22. We discussed ED precautions.  Long term, we discussed rhythm control options including AAD and ablation. Would avoid class IC and Multaq with h/o CHF. His QT may be too long for dofetilide (device is not ICD). Amiodarone may be his best AAD option. With his significant weight loss he may be a candidate for ablation. He is agreeable to discussing with EP.   Continue carvedilol 25 mg BID Continue Eliquis 5 mg BID  2. CHA2DS2VASc  score of 4 Continue Eliquis 5 mg BID   3. Chronic combined systolic and diastolic heart failure  Followed in the [facility] Weight down 3 lbs since 9/15 Check bmet  today.   4. Obesity Body mass index is 42.29 kg/m. Lifestyle modification was discussed and encouraged including regular physical activity and weight  reduction. Patient doing well with diet and exercise and has lost >40 lbs.   5. OSA The importance of adequate treatment of sleep apnea was discussed today in order to improve our ability to maintain sinus rhythm long term. Encouraged compliance with CPAP therapy.    Follow up with Dr Haroldine Laws post DCCV and with EP to establish care.    [facility] [address] [place], [place] [address] [phone]

## 2022-08-11 NOTE — Progress Notes (Signed)
Received: Today [place], Maricela Bo, FNP  Jessiah Steinhart Panda, RN Thanks Margarita Grizzle :)

## 2022-08-11 NOTE — Progress Notes (Signed)
EPIC Encounter for ICM Monitoring  Patient Name: Nicholas Foley is a 71 y.o. male Date: 08/11/2022 Primary Care Physican: [name], MD Primary Cardiologist: Smith/Bensimhon Electrophysiologist: Marisa Sprinkles Pacing:  67.4% decreased from 97.1% 03/17/2022 Weight: 300 lbs  04/23/2022 Weight: Not weighing at home   Clinical Status (06-Aug-2022 to 09-Aug-2022) AT/AF 1 episode  Time in AT/AF 24.0 hr/day (100.0%)           Spoke with patient and heart failure questions reviewed.  Pt reports he has cardioversion scheduled 9/22.  He still feels very poorly since being out of rhythm.  He took Metolazone after HF clinic OV as instructed with relief of fluid symptoms.          Referral:  Made to nephrologist.        Optivol thoracic impedance suggesting fluid levels returned to normal after taking one time Metolazone dose.   Prescribed: Furosemide 40 mg take 2 tablets (80 mg total) twice a day.  Pt reports he self adjust Furosemide and Dr Haroldine Laws is aware. Potassium 20 mEq take 1 tablet daily   Labs:   07/15/2021 Creatinine 1.08, BUN 17, Potassium 3.7, Sodium 138. GFR >60 05/04/2021 Creatinine 1.25, BUN 20, Potassium 4.4, Sodium 136, GFR 63 01/07/2021 Creatinine 1.20, BUN 17, Potassium 3.7, Sodium 139, GFR >60 A complete set of results can be found in Results Review.   Recommendations:   No changes and encouraged to call if experiencing any fluid symptoms.  Sent to Allena Katz, NP at [facility] clinic as requested after 9/15 f/u visit.    Follow-up plan: ICM clinic phone appointment on 08/24/2022 to check stability of fluid levels post cardioversion.   91 day device clinic remote transmission 10/20/2022.     EP/Cardiology Office Visits:   09/02/2022 with Dr Haroldine Laws.  Recall 03/14/2023 with Dr [name].   Copy of ICM check sent to Dr Quentin Ore.  3 month ICM trend: 08/09/2022.    12-14 Month ICM trend:     Rosalene Billings, RN 08/11/2022 11:58 AM

## 2022-08-12 ENCOUNTER — Ambulatory Visit (HOSPITAL_COMMUNITY): Payer: 59 | Admitting: Nurse Practitioner

## 2022-08-13 ENCOUNTER — Ambulatory Visit (HOSPITAL_COMMUNITY)
Admission: RE | Admit: 2022-08-13 | Discharge: 2022-08-13 | Disposition: A | Discharge status: Home / Self Care | Payer: 59 | Attending: Cardiovascular Disease | Admitting: Cardiovascular Disease

## 2022-08-13 ENCOUNTER — Telehealth: Payer: Self-pay | Admitting: Interventional Cardiology

## 2022-08-13 ENCOUNTER — Encounter (HOSPITAL_COMMUNITY)
Admission: RE | Disposition: A | Discharge status: Home / Self Care | Payer: Self-pay | Source: Home / Self Care | Attending: Cardiovascular Disease

## 2022-08-13 ENCOUNTER — Ambulatory Visit (HOSPITAL_COMMUNITY): Payer: 59 | Admitting: Anesthesiology

## 2022-08-13 ENCOUNTER — Other Ambulatory Visit: Payer: Self-pay

## 2022-08-13 ENCOUNTER — Ambulatory Visit (HOSPITAL_BASED_OUTPATIENT_CLINIC_OR_DEPARTMENT_OTHER): Payer: 59 | Admitting: Anesthesiology

## 2022-08-13 ENCOUNTER — Encounter (HOSPITAL_COMMUNITY): Payer: Self-pay | Admitting: Cardiovascular Disease

## 2022-08-13 DIAGNOSIS — D759 Disease of blood and blood-forming organs, unspecified: Secondary | ICD-10-CM | POA: Diagnosis not present

## 2022-08-13 DIAGNOSIS — Z7901 Long term (current) use of anticoagulants: Secondary | ICD-10-CM | POA: Insufficient documentation

## 2022-08-13 DIAGNOSIS — K219 Gastro-esophageal reflux disease without esophagitis: Secondary | ICD-10-CM | POA: Insufficient documentation

## 2022-08-13 DIAGNOSIS — I4819 Other persistent atrial fibrillation: Secondary | ICD-10-CM

## 2022-08-13 DIAGNOSIS — I5042 Chronic combined systolic (congestive) and diastolic (congestive) heart failure: Secondary | ICD-10-CM | POA: Insufficient documentation

## 2022-08-13 DIAGNOSIS — Z87891 Personal history of nicotine dependence: Secondary | ICD-10-CM | POA: Insufficient documentation

## 2022-08-13 DIAGNOSIS — U099 Post covid-19 condition, unspecified: Secondary | ICD-10-CM | POA: Insufficient documentation

## 2022-08-13 DIAGNOSIS — J449 Chronic obstructive pulmonary disease, unspecified: Secondary | ICD-10-CM | POA: Diagnosis not present

## 2022-08-13 DIAGNOSIS — Z950 Presence of cardiac pacemaker: Secondary | ICD-10-CM | POA: Insufficient documentation

## 2022-08-13 DIAGNOSIS — I110 Hypertensive heart disease with heart failure: Secondary | ICD-10-CM | POA: Insufficient documentation

## 2022-08-13 DIAGNOSIS — I509 Heart failure, unspecified: Secondary | ICD-10-CM | POA: Diagnosis not present

## 2022-08-13 DIAGNOSIS — I4891 Unspecified atrial fibrillation: Secondary | ICD-10-CM | POA: Diagnosis not present

## 2022-08-13 DIAGNOSIS — G4733 Obstructive sleep apnea (adult) (pediatric): Secondary | ICD-10-CM | POA: Diagnosis not present

## 2022-08-13 DIAGNOSIS — I447 Left bundle-branch block, unspecified: Secondary | ICD-10-CM | POA: Insufficient documentation

## 2022-08-13 DIAGNOSIS — Z79899 Other long term (current) drug therapy: Secondary | ICD-10-CM | POA: Diagnosis not present

## 2022-08-13 DIAGNOSIS — Z6841 Body Mass Index (BMI) 40.0 and over, adult: Secondary | ICD-10-CM | POA: Diagnosis not present

## 2022-08-13 HISTORY — PX: CARDIOVERSION: SHX1299

## 2022-08-13 SURGERY — CARDIOVERSION
Anesthesia: General

## 2022-08-13 MED ORDER — LIDOCAINE 2% (20 MG/ML) 5 ML SYRINGE
INTRAMUSCULAR | Status: DC | PRN
  Administered 2022-08-13: 100 mg via INTRAVENOUS

## 2022-08-13 MED ORDER — AMIODARONE HCL 200 MG PO TABS: ORAL_TABLET | ORAL | 0 refills | Status: DC

## 2022-08-13 MED ORDER — PROPOFOL 10 MG/ML IV BOLUS
INTRAVENOUS | Status: DC | PRN
  Administered 2022-08-13: 80 mg via INTRAVENOUS

## 2022-08-13 MED ORDER — SODIUM CHLORIDE 0.9 % IV SOLN: INTRAVENOUS | Status: DC

## 2022-08-13 NOTE — Telephone Encounter (Signed)
Called patient back about his message. Patient stated he had cardioversion today and did not work. Patient wants to know if there is a medication he can take to make him feel better, so he can return to work on Monday. Dr. [name] is out of the office, will send message to A. Fib clinic to see if they can help.

## 2022-08-13 NOTE — Anesthesia Postprocedure Evaluation (Signed)
Anesthesia Post Note  Patient: Nicholas Foley  Procedure(s) Performed: CARDIOVERSION     Patient location during evaluation: Endoscopy Anesthesia Type: General Level of consciousness: awake and alert Pain management: pain level controlled Vital Signs Assessment: post-procedure vital signs reviewed and stable Respiratory status: spontaneous breathing, nonlabored ventilation, respiratory function stable and patient connected to nasal cannula oxygen Cardiovascular status: blood pressure returned to baseline and stable Postop Assessment: no apparent nausea or vomiting Anesthetic complications: no   No notable events documented.  Last Vitals:  Vitals:   08/13/22 1110 08/13/22 1120  BP: 117/77 130/80  Pulse: 73 71  Resp: 18 (!) 23  Temp:    SpO2: 93% 95%    Last Pain:  Vitals:   08/13/22 1120  TempSrc:   PainSc: 0-No pain                 Santa Lighter

## 2022-08-13 NOTE — Telephone Encounter (Signed)
[place], [place], [place]  [place], RN Would recommend starting amiodarone 200 mg BID with repeat DCCV after loading. No good other AAD options. Cmet and TSH from 06/29/22 reviewed.    Patient notified of above recommendations. Pt will start Amiodarone this evening. Pt has follow up in place for 10/12 and dccv can be set up at that time if still in AF. Pt verbalized understanding.

## 2022-08-13 NOTE — Progress Notes (Signed)
Remote pacemaker transmission.   

## 2022-08-13 NOTE — CV Procedure (Signed)
[place]: Anesthesia: Propofol  DCC x 2 150 J then 200J x 2 biphasic with AP manual compression Patient failed to convert without a single sinus beat  Interrogation of [employer] PPM shows normal function and continued afib in [facility]  Patient will need AAT ? Amiodarone and EP consult Needs to lose weight for ablation  Jenkins Rouge MD [facility]

## 2022-08-13 NOTE — Transfer of Care (Signed)
Immediate Anesthesia Transfer of Care Note  Patient: [name]  Procedure(s) Performed: CARDIOVERSION  Patient Location: PACU and Endoscopy Unit  Anesthesia Type:General  Level of Consciousness: drowsy and responds to stimulation  Airway & Oxygen Therapy: Patient Spontanous Breathing  Post-op Assessment: Report given to RN and Post -op Vital signs reviewed and stable  Post vital signs: Reviewed and stable  Last Vitals:  Vitals Value Taken Time  BP    Temp    Pulse    Resp    SpO2      Last Pain:  Vitals:   08/13/22 1004  PainSc: (P) 0-No pain         Complications: No notable events documented.

## 2022-08-13 NOTE — Anesthesia Preprocedure Evaluation (Signed)
Anesthesia Evaluation  Patient identified by MRN, date of birth, ID band Patient awake    Reviewed: Allergy & Precautions, NPO status , Patient's Chart, lab work & pertinent test results, reviewed documented beta blocker date and time   Airway Mallampati: II  TM Distance: >3 FB Neck ROM: Full    Dental  (+) Teeth Intact, Dental Advisory Given   Pulmonary sleep apnea and Continuous Positive Airway Pressure Ventilation , COPD,  COPD inhaler, former smoker,    Pulmonary exam normal breath sounds clear to auscultation       Cardiovascular hypertension, Pt. on home beta blockers +CHF  + dysrhythmias (LBBB) Atrial Fibrillation + pacemaker  Rhythm:Irregular Rate:Abnormal     Neuro/Psych  Neuromuscular disease    GI/Hepatic Neg liver ROS, GERD  Medicated and Controlled,  Endo/Other  Morbid obesity  Renal/GU negative Renal ROS     Musculoskeletal negative musculoskeletal ROS (+)   Abdominal   Peds  Hematology  (+) Blood dyscrasia (Eliquis), ,   Anesthesia Other Findings Day of surgery medications reviewed with the patient.  Reproductive/Obstetrics                             Anesthesia Physical Anesthesia Plan  ASA: 3  Anesthesia Plan: General   Post-op Pain Management: Minimal or no pain anticipated   Induction: Intravenous  PONV Risk Score and Plan: 2 and TIVA and Treatment may vary due to age or medical condition  Airway Management Planned: Natural Airway and Mask  Additional Equipment:   Intra-op Plan:   Post-operative Plan:   Informed Consent: I have reviewed the patients History and Physical, chart, labs and discussed the procedure including the risks, benefits and alternatives for the proposed anesthesia with the patient or authorized representative who has indicated his/her understanding and acceptance.     Dental advisory given  Plan Discussed with: CRNA  Anesthesia Plan  Comments:         Anesthesia Quick Evaluation

## 2022-08-13 NOTE — Discharge Instructions (Signed)
Electrical Cardioversion  Electrical cardioversion is the delivery of a jolt of electricity to restore a normal rhythm to the heart. A rhythm that is too fast or is not regular keeps the heart from pumping well. In this procedure, sticky patches or metal paddles are placed on the chest to deliver electricity to the heart from a device.  What can I expect after the procedure?  Your blood pressure, heart rate, breathing rate, and blood oxygen level will be monitored until you leave the hospital or clinic.  Your heart rhythm will be watched to make sure it does not change.  You may have some redness on the skin where the shocks were given.If this occurs, can use hydrocortisone cream or Aloe vera.  Follow these instructions at home:  Do not drive for 24 hours if you were given a sedative during your procedure.  Take over-the-counter and prescription medicines only as told by your health care provider.  Ask your health care provider how to check your pulse. Check it often.  Rest for 48 hours after the procedure or as told by your health care provider.  Avoid or limit your caffeine use as told by your health care provider.  Keep all follow-up visits as told by your health care provider. This is important.  Contact a health care provider if:  You feel like your heart is beating too quickly or your pulse is not regular.  You have a serious muscle cramp that does not go away.  Get help right away if:  You have discomfort in your chest.  You are dizzy or you feel faint.  You have trouble breathing or you are short of breath.  Your speech is slurred.  You have trouble moving an arm or leg on one side of your body.  Your fingers or toes turn cold or blue.  Summary  Electrical cardioversion is the delivery of a jolt of electricity to restore a normal rhythm to the heart.  This procedure may be done right away in an emergency or may be a scheduled procedure if the condition is not  an emergency.  Generally, this is a safe procedure.  After the procedure, check your pulse often as told by your health care provider.  This information is not intended to replace advice given to you by your health care provider. Make sure you discuss any questions you have with your health care provider. Document Revised: 06/11/2019 Document Reviewed: 06/11/2019 Elsevier Patient Education  2021 Elsevier Inc.  

## 2022-08-13 NOTE — Telephone Encounter (Signed)
Patient called stating his cardioversion did not take and patient wants to know next steps.  Patient stated he will need to go back to work on Monday.

## 2022-08-13 NOTE — Interval H&P Note (Signed)
History and Physical Interval Note:  08/13/2022 10:45 AM  Nicholas Foley  has presented today for surgery, with the diagnosis of AFIB.  The various methods of treatment have been discussed with the patient and family. After consideration of risks, benefits and other options for treatment, the patient has consented to  Procedure(s): CARDIOVERSION (N/A) as a surgical intervention.  The patient's history has been reviewed, patient examined, no change in status, stable for surgery.  I have reviewed the patient's chart and labs.  Questions were answered to the patient's satisfaction.     Jenkins Rouge

## 2022-08-13 NOTE — Anesthesia Procedure Notes (Signed)
Procedure Name: General with mask airway Date/Time: 08/13/2022 10:52 AM  Performed by: Janace Litten, CRNAPre-anesthesia Checklist: Emergency Drugs available, Patient identified, Suction available, Patient being monitored and Timeout performed Patient Re-evaluated:Patient Re-evaluated prior to induction Oxygen Delivery Method: Ambu bag Preoxygenation: Pre-oxygenation with 100% oxygen Induction Type: IV induction

## 2022-08-15 ENCOUNTER — Encounter (HOSPITAL_COMMUNITY): Payer: Self-pay | Admitting: Cardiovascular Disease

## 2022-08-18 ENCOUNTER — Telehealth: Payer: Self-pay

## 2022-08-18 NOTE — Telephone Encounter (Signed)
Spoke with patient and provided information from device clinic nurse that he remains in afib.  He said he would like to send another report tomorrow before he has PET scan on Friday and will let the staff know at that facility that he is afib after confirmation on tomorrows report.  Advised that will be fine.

## 2022-08-18 NOTE — Telephone Encounter (Signed)
Message received from device clinic nurse:  Received: Today Damian Leavell, RN  Anijah Spohr Panda, RN Still in afib.   Sonia Baller

## 2022-08-18 NOTE — Telephone Encounter (Signed)
Remote transmission received and reviewed.  Pt reports feeling really bad all weekend and assumed it was from being in Afib.  He is feeling much better today.  Advised will have device clinic triage nurse to review today's report to confirm he is still in afib.  Advised report was suggesting fluid accumulation over the weekend but returned to normal today.  He thinks maybe the fluid was making him feel bad over the weekend because is he feels much better today which correlates with fluid levels back to normal.    Advised will call back after report is read by device clinic triage nurse.     9/27 remote transmission

## 2022-08-18 NOTE — Telephone Encounter (Signed)
Spoke with patient.  He reports cardioversion as attempted 3 times last week but was not successful.  He wa started on Amiodarone and today he feels so much better that he wonders if his heart is back in rhythm.  He has a scan to check lung nodules for Friday and his wife will be hospitalized next week to have colon removed.  He has a lot of stress at this time.  He will send report to review rhythm to see if he is is in NSR.

## 2022-08-19 NOTE — Telephone Encounter (Signed)
Spoke with patient and advised the transmission results are unchanged from yesterday.   He reports feeling okay today and plans on having PET scan of the lungs tomorrow.  He appreciated the call back.   08/19/2022 transmission

## 2022-08-20 ENCOUNTER — Telehealth (HOSPITAL_COMMUNITY): Payer: Self-pay

## 2022-08-20 ENCOUNTER — Ambulatory Visit (HOSPITAL_COMMUNITY)
Admission: RE | Admit: 2022-08-20 | Discharge: 2022-08-20 | Disposition: A | Discharge status: Home / Self Care | Payer: 59 | Source: Ambulatory Visit | Attending: Pulmonary Disease | Admitting: Pulmonary Disease

## 2022-08-20 DIAGNOSIS — R918 Other nonspecific abnormal finding of lung field: Secondary | ICD-10-CM | POA: Insufficient documentation

## 2022-08-20 DIAGNOSIS — I5042 Chronic combined systolic (congestive) and diastolic (congestive) heart failure: Secondary | ICD-10-CM

## 2022-08-20 LAB — GLUCOSE, CAPILLARY: Glucose-Capillary: 115 mg/dL — ABNORMAL HIGH (ref 70–99)

## 2022-08-20 MED ORDER — FLUDEOXYGLUCOSE F - 18 (FDG) INJECTION
15.0000 | Freq: Once | INTRAVENOUS | Status: AC | PRN
  Administered 2022-08-20: 14.99 via INTRAVENOUS

## 2022-08-20 NOTE — Telephone Encounter (Signed)
-----   Message from Rafael Bihari, [place] sent at 08/16/2022  4:53 PM EDT ----- Repeat BMET in 10-14 days

## 2022-08-20 NOTE — Telephone Encounter (Signed)
Patient's lab order has been scheduled and pt will repeat at his appointment in 2 weeks. B-met has been added to the appointment line.  Pt aware, agreeable, and verbalized understanding

## 2022-08-23 ENCOUNTER — Inpatient Hospital Stay: Admission: RE | Admit: 2022-08-23 | Payer: 59 | Source: Ambulatory Visit

## 2022-08-24 ENCOUNTER — Ambulatory Visit (INDEPENDENT_AMBULATORY_CARE_PROVIDER_SITE_OTHER): Payer: 59

## 2022-08-24 ENCOUNTER — Other Ambulatory Visit: Payer: Self-pay | Admitting: *Deleted

## 2022-08-24 DIAGNOSIS — R918 Other nonspecific abnormal finding of lung field: Secondary | ICD-10-CM

## 2022-08-24 DIAGNOSIS — Z950 Presence of cardiac pacemaker: Secondary | ICD-10-CM

## 2022-08-24 DIAGNOSIS — I5042 Chronic combined systolic (congestive) and diastolic (congestive) heart failure: Secondary | ICD-10-CM

## 2022-08-24 NOTE — Progress Notes (Signed)
EPIC Encounter for ICM Monitoring  Patient Name: Nicholas Foley is a 71 y.o. male Date: 08/24/2022 Primary Care Physican: [name], MD Primary Cardiologist: Smith/Bensimhon Electrophysiologist: Marisa Sprinkles Pacing:  68.6% 03/17/2022 Weight: 300 lbs  04/23/2022 Weight: Not weighing at home              Spoke with patient and heart failure questions reviewed.  Pt reports failed cardioversion on 9/22.  He reports feeling very fatigued and SOB due to Afib making it difficult to walk across the room.  He is no longer able to workout at the gym.  Wife is having colon surgery this week.           Referral:  Made to nephrologist.        Optivol thoracic impedance suggesting possible fluid accumulation starting 9/17 and returning close to baseline 10/3.   Prescribed: Furosemide 40 mg take 2 tablets (80 mg total) twice a day.  Pt reports he self adjust Furosemide and Dr Haroldine Laws is aware. Potassium 20 mEq take 1 tablet daily   Labs:   07/15/2021 Creatinine 1.08, BUN 17, Potassium 3.7, Sodium 138. GFR >60 05/04/2021 Creatinine 1.25, BUN 20, Potassium 4.4, Sodium 136, GFR 63 01/07/2021 Creatinine 1.20, BUN 17, Potassium 3.7, Sodium 139, GFR >60 A complete set of results can be found in Results Review.   Recommendations:   Copy sent to Dr Haroldine Laws as Juluis Rainier and review.  He is going to call HF clinic to see if he can get an earlier appointment with Dr Haroldine Laws.   Follow-up plan: ICM clinic phone appointment on 09/06/2022.   91 day device clinic remote transmission 10/20/2022.     EP/Cardiology Office Visits:   09/02/2022 with Dr Haroldine Laws.  Recall 03/14/2023 with Dr [name].   Copy of ICM check sent to Dr Quentin Ore.   3 month ICM trend: 08/24/2022.    12-14 Month ICM trend:     Rosalene Billings, RN 08/24/2022 2:14 PM

## 2022-08-27 ENCOUNTER — Telehealth (HOSPITAL_COMMUNITY): Payer: Self-pay | Admitting: *Deleted

## 2022-08-27 NOTE — Telephone Encounter (Signed)
Pt called to request appt with Bensimhon only to discuss afib and how he is feeling. He was started on Amio and had unsuccessful dccv on 9/22. He states since then he has been having issues with SOB and fatigue. He was going to gym and working out but now doesn't even feel like getting out of recliner. He states he can tell he is retaining some fluid as well, had Optivol check on 10/3, impedance starting to rise, correlates with afib. Advised pt is already sch for app with DB on 10/12, he didn't realize it was that soon, he is ok to keep that appt. Advised ok to take Metolazone (1 dose) over weekend to see if that will help with fluid, if symptoms worsen before appt 10/12 he will call us back.

## 2022-09-02 ENCOUNTER — Other Ambulatory Visit (HOSPITAL_COMMUNITY): Payer: Self-pay

## 2022-09-02 ENCOUNTER — Encounter (HOSPITAL_COMMUNITY): Payer: Self-pay | Admitting: Internal Medicine

## 2022-09-02 ENCOUNTER — Ambulatory Visit (HOSPITAL_COMMUNITY)
Admission: RE | Admit: 2022-09-02 | Discharge: 2022-09-02 | Disposition: A | Discharge status: Home / Self Care | Payer: 59 | Source: Ambulatory Visit | Attending: Internal Medicine | Admitting: Internal Medicine

## 2022-09-02 VITALS — BP 102/60 | HR 74 | Wt 308.6 lb

## 2022-09-02 DIAGNOSIS — I4819 Other persistent atrial fibrillation: Secondary | ICD-10-CM

## 2022-09-02 DIAGNOSIS — Z7901 Long term (current) use of anticoagulants: Secondary | ICD-10-CM | POA: Insufficient documentation

## 2022-09-02 DIAGNOSIS — G4733 Obstructive sleep apnea (adult) (pediatric): Secondary | ICD-10-CM | POA: Diagnosis not present

## 2022-09-02 DIAGNOSIS — Z87891 Personal history of nicotine dependence: Secondary | ICD-10-CM | POA: Insufficient documentation

## 2022-09-02 DIAGNOSIS — Z6841 Body Mass Index (BMI) 40.0 and over, adult: Secondary | ICD-10-CM | POA: Insufficient documentation

## 2022-09-02 DIAGNOSIS — I480 Paroxysmal atrial fibrillation: Secondary | ICD-10-CM | POA: Diagnosis not present

## 2022-09-02 DIAGNOSIS — I428 Other cardiomyopathies: Secondary | ICD-10-CM | POA: Diagnosis not present

## 2022-09-02 DIAGNOSIS — I5043 Acute on chronic combined systolic (congestive) and diastolic (congestive) heart failure: Secondary | ICD-10-CM | POA: Diagnosis present

## 2022-09-02 DIAGNOSIS — I5023 Acute on chronic systolic (congestive) heart failure: Secondary | ICD-10-CM

## 2022-09-02 DIAGNOSIS — N1831 Chronic kidney disease, stage 3a: Secondary | ICD-10-CM | POA: Insufficient documentation

## 2022-09-02 DIAGNOSIS — J449 Chronic obstructive pulmonary disease, unspecified: Secondary | ICD-10-CM | POA: Insufficient documentation

## 2022-09-02 DIAGNOSIS — Z4502 Encounter for adjustment and management of automatic implantable cardiac defibrillator: Secondary | ICD-10-CM | POA: Diagnosis not present

## 2022-09-02 DIAGNOSIS — I130 Hypertensive heart and chronic kidney disease with heart failure and stage 1 through stage 4 chronic kidney disease, or unspecified chronic kidney disease: Secondary | ICD-10-CM | POA: Diagnosis present

## 2022-09-02 DIAGNOSIS — I447 Left bundle-branch block, unspecified: Secondary | ICD-10-CM | POA: Insufficient documentation

## 2022-09-02 DIAGNOSIS — I5042 Chronic combined systolic (congestive) and diastolic (congestive) heart failure: Secondary | ICD-10-CM

## 2022-09-02 DIAGNOSIS — Z79899 Other long term (current) drug therapy: Secondary | ICD-10-CM | POA: Diagnosis not present

## 2022-09-02 DIAGNOSIS — N183 Chronic kidney disease, stage 3 unspecified: Secondary | ICD-10-CM

## 2022-09-02 LAB — BASIC METABOLIC PANEL
Anion gap: 10 (ref 5–15)
BUN: 18 mg/dL (ref 8–23)
CO2: 25 mmol/L (ref 22–32)
Calcium: 8.6 mg/dL — ABNORMAL LOW (ref 8.9–10.3)
Chloride: 104 mmol/L (ref 98–111)
Creatinine, Ser: 1.53 mg/dL — ABNORMAL HIGH (ref 0.61–1.24)
GFR, Estimated: 49 mL/min — ABNORMAL LOW (ref >60)
Glucose, Bld: 113 mg/dL — ABNORMAL HIGH (ref 70–99)
Potassium: 4.1 mmol/L (ref 3.5–5.1)
Sodium: 139 mmol/L (ref 135–145)

## 2022-09-02 MED ORDER — METOLAZONE 2.5 MG PO TABS: 2.5000 mg | ORAL_TABLET | ORAL | 2 refills | Status: DC

## 2022-09-02 NOTE — Patient Instructions (Addendum)
Take Metolazone 2.'5mg'$  every Monday and Friday, take 2 potassium tablets with the metolazone   Labs done today, your results will be available in MyChart, we will contact you for abnormal readings.  Repeat lab work in 3 weeks  You are scheduled for a  Cardioversion on Friday October 13th  with Dr. Haroldine Laws.  Please arrive at the [facility] (Main Entrance A) at [facility]: Nicholas Foley, Nicholas Foley at 2 pm. (1 hour prior to procedure)  DIET: Nothing to eat or drink after midnight except a sip of water with medications (see medication instructions below)  FYI: For your safety, and to allow [place] to monitor your vital signs accurately during the surgery/procedure we request that   if you have artificial nails, gel coating, SNS etc. Please have those removed prior to your surgery/procedure. Not having the nail coverings /polish removed may result in cancellation or delay of your surgery/procedure.   Medication Instructions: Hold your Lasix and  Spironolactone the morning of your procedure  Continue your anticoagulant: Eliquis  You will need to continue your anticoagulant after your procedure until you are told by your  Provider that it is safe to stop   You must have a responsible person to drive you home and stay in the waiting area during your procedure. Failure to do so could result in cancellation.  Bring your insurance cards.  *Special Note: Every effort is made to have your procedure done on time. Occasionally there are emergencies that occur at the hospital that may cause delays. Please be patient if a delay does occur.    Your physician recommends that you schedule a follow-up appointment in: 2 months If you have any questions or concerns before your next appointment please send [place] a message through [place] or call our office at [phone].    TO LEAVE A MESSAGE FOR THE NURSE SELECT OPTION 2, PLEASE LEAVE A MESSAGE INCLUDING: YOUR NAME DATE OF  BIRTH CALL BACK NUMBER REASON FOR CALL**this is important as we prioritize the call backs  YOU WILL RECEIVE A CALL BACK THE SAME DAY AS LONG AS YOU CALL BEFORE 4:00 PM  At the [facility], you and your health needs are our priority. As part of our continuing mission to provide you with exceptional heart care, we have created designated Provider Care Teams. These Care Teams include your primary Cardiologist (physician) and Advanced Practice Providers (APPs- Physician Assistants and Nurse Practitioners) who all work together to provide you with the care you need, when you need it.   You may see any of the following providers on your designated Care Team at your next follow up: Dr Glori Bickers Dr Loralie Champagne Dr. Roxana Hires, NP Lyda Jester, [place] [facility] [place], [place] Forestine Na, NP Audry Riles, PharmD   Please be sure to bring in all your medications bottles to every appointment.

## 2022-09-02 NOTE — Progress Notes (Signed)
Advanced Heart Failure Clinic Note   Date:  09/02/2022   ID:  AAHAN MARQUES, DOB Apr 18, 1951, MRN [id number]  Location: Home  Provider location: [facility] Type of Visit: Established patient  PCP:  [name], MD  Primary Cardiologist:  [name], MD HF Cardiologist: Keaun Schnabel  Chief Complaint: Heart Failure follow-up    HPI:  Mr Liller is a 71 y.o. with morbid obesity, chronic systolic heart failure, COPD, OSA, PAF, previously on amio-> stopped 2018,  HTN, LBBB, Medtronic CRT-P, and former smoker.   He has h/o NICM which seems to date back to 2014. Had cardiac cath in 1/20 with no CAD and well compensated filling pressures.    Admitted in 1/20 for CRT-P device in setting of LBBB. Intially he felt a lot better but has gradually declined.   Echo 01/30/20 EF 40-45% (read as 35-40%)  Echo EF 2/22 45% (in some images 45-50%) Limited by septal paradox.   Got COVID again in 1/22 but was a mild case.   Had recurrent AF. Underwent DC-CV 8/22.   Follow up 5/23, NYHA I-II, fluid stable. Echo 04/12/22 showed EF 50-55%, LV dyysynchrony, grade I DD,  RV ok.   Today he returns for work-in visit due to persistent dyspnea, fluid overload and AF.  Developed recurrent AF in early September. Seen in AF clinic. Amio started. Had DC-CV attempt on 08/13/22 which failed x 3. Taking lasix 80 po bid without much benefit. Had metolazone x 1 in recent past and that helped get the fluid down. SOB with any activity. Very fatigued.     Cardiac Studies  - Echo (5/23): EF 50-55%, severe LV dyysynchrony, grade I DD< RV ok,   - Echo (2/22): EF45%  - Echo (3/21): EF 40-45% (read as 35-40%).  - CPX (09/18/19):  Limited by obesity and mild heart failure Peak VO2 15.1  (80% predicted peak VO2) - when corrected to ibw pVO2 27.5 Slope 33 RER 1.02    - L/RHC (1/20) No coronary disease.  RA 13 PA 37/20 (27)  PCWP 14 CO 8.5 CI 3.3    - Echo (11/20): RV normal  EF 45-50%   - Echo (11/19): EF 35-40% Grade II DD      Past Medical History:  Diagnosis Date   Atrial fibrillation with rapid ventricular response ([place]) 08/02/2013   CHF (congestive heart failure) (HCC)    Chronic systolic dysfunction of left ventricle 08/02/2013   BNP greater than 2000    COPD (chronic obstructive pulmonary disease) (HCC)    Moderate by PFTs with response to bronchodilators, May 2014   Erectile dysfunction    Hypertension    Insomnia 08/02/13   Left bundle branch block 08/02/2013   Morbid obesity ([place]) 08/02/2013   OSA (obstructive sleep apnea)    severe OSA with AHI 74/hr now on CPAP at 8cm H2O   Past Surgical History:  Procedure Laterality Date   BIV PACEMAKER INSERTION CRT-P N/A 12/14/2018   Medtronic model [id number] Percepta Quad CRT-P MRI Surescan (serial Number [id number]) device implanted by Dr [name] for CHF and LBBB   CARDIOVERSION N/A 08/06/2013   Procedure: CARDIOVERSION;  Surgeon: Lelon Perla, MD;  Location: [place];  Service: Cardiovascular;  Laterality: N/A;  spoke with [name]    CARDIOVERSION N/A 09/03/2013   Procedure: CARDIOVERSION;  Surgeon: [name], MD;  Location: [place];  Service: Cardiovascular;  Laterality: N/A;   CARDIOVERSION N/A 07/21/2021  Procedure: CARDIOVERSION;  Surgeon: Larey Dresser, MD;  Location: [facility] ENDOSCOPY;  Service: Cardiovascular;  Laterality: N/A;   CARDIOVERSION N/A 08/13/2022   Procedure: CARDIOVERSION;  Surgeon: Josue Hector, MD;  Location: [facility] ENDOSCOPY;  Service: Cardiovascular;  Laterality: N/A;   chronic systolic dysfu     NASAL SEPTUM SURGERY     RIGHT/LEFT HEART CATH AND CORONARY ANGIOGRAPHY N/A 12/04/2018   Procedure: RIGHT/LEFT HEART CATH AND CORONARY ANGIOGRAPHY;  Surgeon: Belva Crome, MD;  Location: [place] CV LAB;  Service: Cardiovascular;  Laterality: N/A;   TEE WITHOUT CARDIOVERSION N/A 08/06/2013   Procedure: TRANSESOPHAGEAL ECHOCARDIOGRAM (TEE);  Surgeon: Lelon Perla, MD;   Location: [facility] ENDOSCOPY;  Service: Cardiovascular;  Laterality: N/A;   VASECTOMY       Current Outpatient Medications  Medication Sig Dispense Refill   acetaminophen (TYLENOL) 650 MG CR tablet Take 1,300 mg by mouth in the morning and at bedtime. Arthritis strength     ADVAIR DISKUS 250-50 MCG/DOSE AEPB Inhale 1 puff into the lungs 2 (two) times daily.     amiodarone (PACERONE) 200 MG tablet Take 1 tablet by mouth twice a day for 1 month then reduce to 1 tablet daily 60 tablet 0   carvedilol (COREG) 25 MG tablet Take 1 tablet (25 mg total) by mouth 2 (two) times daily with a meal. 180 tablet 3   cholecalciferol (VITAMIN D3) 25 MCG (1000 UT) tablet Take 1,000 Units by mouth daily.     CIALIS 20 MG tablet Take 20 mg by mouth daily as needed for erectile dysfunction.   0   cyclobenzaprine (FLEXERIL) 10 MG tablet Take 10 mg by mouth 3 (three) times daily as needed for muscle spasms.     ELIQUIS 5 MG TABS tablet TAKE 1 TABLET(5 MG) BY MOUTH TWICE DAILY 180 tablet 2   empagliflozin (JARDIANCE) 10 MG TABS tablet Take 1 tablet (10 mg total) by mouth daily before breakfast. 90 tablet 3   ENTRESTO 97-103 MG TAKE 1 TABLET BY MOUTH TWICE DAILY 180 tablet 3   fluticasone (FLONASE) 50 MCG/ACT nasal spray Place 1 spray into both nostrils 2 (two) times daily.   0   furosemide (LASIX) 40 MG tablet TAKE 2 TABLETS(80 MG) BY MOUTH TWICE DAILY 360 tablet 2   Multiple Vitamins-Minerals (PRESERVISION AREDS PO) Take 1 capsule by mouth 2 (two) times daily.     omeprazole (PRILOSEC OTC) 20 MG tablet Take 20 mg by mouth daily.     potassium chloride SA (KLOR-CON M) 20 MEQ tablet Take 2 tablets (40 mEq total) by mouth daily. 180 tablet 3   spironolactone (ALDACTONE) 25 MG tablet Take 1 tablet (25 mg total) by mouth daily. 90 tablet 1   tamsulosin (FLOMAX) 0.4 MG CAPS capsule Take 0.4 mg by mouth daily.     vitamin B-12 (CYANOCOBALAMIN) 1000 MCG tablet Take 1,000 mcg by mouth daily.     zolpidem (AMBIEN CR) 12.5 MG CR  tablet Take 1 tablet by mouth at bedtime as needed for sleep.     No current facility-administered medications for this encounter.    Allergies:   Ace inhibitors and Angiotensin receptor blockers   Social History:  The patient  reports that he quit smoking about 10 years ago. His smoking use included cigarettes and cigars. He has a 64.50 pack-year smoking history. He has never used smokeless tobacco. He reports current alcohol use. He reports that he does not use drugs.   Family History:  The patient's family history  includes Atrial fibrillation in his mother; COPD in his father; Cerebral aneurysm in his brother; Healthy in his daughter.   ROS:  Please see the history of present illness.   All other systems are personally reviewed and negative.   Recent Labs: 08/06/2022: B Natriuretic Peptide 104.5; Hemoglobin 14.7; Platelets 246 08/09/2022: BUN 33; Creatinine, Ser 1.68; Potassium 4.2; Sodium 139  Personally reviewed   Wt Readings from Last 3 Encounters:  09/02/22 (!) 140 kg (308 lb 9.6 oz)  08/13/22 (!) 137.5 kg (303 lb 2.1 oz)  08/09/22 (!) 137.5 kg (303 lb 3.2 oz)    BP 102/60   Pulse 74   Wt (!) 140 kg (308 lb 9.6 oz)   SpO2 95%   BMI 43.04 kg/m   Physical Exam General:  Obese male. No resp difficulty HEENT: normal Neck: supple. JVP up. Carotids 2+ bilat; no bruits. No lymphadenopathy or thryomegaly appreciated. Cor: PMI nondisplaced. Regular rate & rhythm. No rubs, gallops or murmurs. Lungs: clear Abdomen: obese soft, nontender, nondistended. No hepatosplenomegaly. No bruits or masses. Good bowel sounds. Extremities: no cyanosis, clubbing, rash, 1+ edema Neuro: alert & orientedx3, cranial nerves grossly intact. moves all 4 extremities w/o difficulty. Affect pleasant   ECG (personally reviewed): AF 90 bpm  Device interrogation (personally reviewed): AF since early September. Optivol up .Personally reviewed  ASSESSMENT AND PLAN: 1. Acute on Chronic Combined  Systolic/Diastolic HF - NICM (thought due to LBBB). 12/2018 LHC no coronary disease.  - s/p Medtronic CRT-P - Echo (1/19) EF 35-40%. ECHO 11/19 EF 45-50%.  - Echo (11/20): EF read as 35-40% but with Definity I felt closed to 45% - Echo (3/21): EF read as 35-40% I think 40-45% - Echo EF (2/22): EF 45% (in some images 45-50%) Limited by septal paradox. - CPX test (10/20) suboptimal effort mostly limited to obesity  - Echo (5/23) EF 50-55%, LV dyysynchrony, grade I DD, RV ok.  - Worse NYHA IIIb, in setting of recurrent volume overload Likely due to recurrent AF. - ICD interrogation done personally. Results as above - Continue Lasix 80 mg bid. Add metolazone 2.5 q M/F + kcl 40 with each dose.  - Arrange for DC-CV - Continue carvedilol 25 mg bid. - Continue Entresto 97-103 mg bid. - Continue spiro 25 mg daily. - Continue Jadiance 10 mg daily.   2. PAF  - In the past he was on amiodarone. Stopped ~2017 - Had recurrent AF. Underwent DC-CV 8/22. Followed by AF Clinic.  - CHA2DS2Vasc is 3. - Continue Eliquis 5 bid. No bleeding. He has not missed any doses. - Failed recent DC-CV - Seen by AF Clinic and started amio 200 bid - Will plan repeat DC-CV now with amio support - Does not tolerate AF well. Previously not felt to be ablation candidate due to size. He has lost 40 pounds but BMI still high (43). Has f/u with Dr. Quentin Ore next month to discuss.     3. OSA - Continue CPAP.   4. Obesity -  Body mass index is 43.04 kg/m.  -  Continue weight loss efforts. Recently lost 40 pounds -  Insurance denied [employer].  5. COPD  - No longer smoking.  6. CKD 3a - Last SCr 1.5-1.6 - Continue SGLT2i. - BMET today. - Has been referred to Nephrology - Watch renal function with metolazone.  - Check BMET today and in 2-3 weeks    Glori Bickers, MD  09/02/2022 12:07 PM  [facility] [address]  [facility] [place]  [address] [phone] (office) [phone] (fax)

## 2022-09-02 NOTE — H&P (View-Only) (Signed)
Advanced Heart Failure Clinic Note   Date:  09/02/2022   ID:  Nicholas Foley, DOB 10-16-1951, MRN [id number]  Location: Home  Provider location: [facility] Type of Visit: Established patient  PCP:  [name], MD  Primary Cardiologist:  [name], MD HF Cardiologist: Isabell Bonafede  Chief Complaint: Heart Failure follow-up    HPI:  Nicholas Foley is a 71 y.o. with morbid obesity, chronic systolic heart failure, COPD, OSA, PAF, previously on amio-> stopped 2018,  HTN, LBBB, Medtronic CRT-P, and former smoker.   He has h/o NICM which seems to date back to 2014. Had cardiac cath in 1/20 with no CAD and well compensated filling pressures.    Admitted in 1/20 for CRT-P device in setting of LBBB. Intially he felt a lot better but has gradually declined.   Echo 01/30/20 EF 40-45% (read as 35-40%)  Echo EF 2/22 45% (in some images 45-50%) Limited by septal paradox.   Got COVID again in 1/22 but was a mild case.   Had recurrent AF. Underwent DC-CV 8/22.   Follow up 5/23, NYHA I-II, fluid stable. Echo 04/12/22 showed EF 50-55%, LV dyysynchrony, grade I DD,  RV ok.   Today he returns for work-in visit due to persistent dyspnea, fluid overload and AF.  Developed recurrent AF in early September. Seen in AF clinic. Amio started. Had DC-CV attempt on 08/13/22 which failed x 3. Taking lasix 80 po bid without much benefit. Had metolazone x 1 in recent past and that helped get the fluid down. SOB with any activity. Very fatigued.     Cardiac Studies  - Echo (5/23): EF 50-55%, severe LV dyysynchrony, grade I DD< RV ok,   - Echo (2/22): EF45%  - Echo (3/21): EF 40-45% (read as 35-40%).  - CPX (09/18/19):  Limited by obesity and mild heart failure Peak VO2 15.1  (80% predicted peak VO2) - when corrected to ibw pVO2 27.5 Slope 33 RER 1.02    - L/RHC (1/20) No coronary disease.  RA 13 PA 37/20 (27)  PCWP 14 CO 8.5 CI 3.3    - Echo (11/20): RV normal  EF 45-50%   - Echo (11/19): EF 35-40% Grade II DD      Past Medical History:  Diagnosis Date   Atrial fibrillation with rapid ventricular response ([place]) 08/02/2013   CHF (congestive heart failure) (HCC)    Chronic systolic dysfunction of left ventricle 08/02/2013   BNP greater than 2000    COPD (chronic obstructive pulmonary disease) (HCC)    Moderate by PFTs with response to bronchodilators, May 2014   Erectile dysfunction    Hypertension    Insomnia 08/02/13   Left bundle branch block 08/02/2013   Morbid obesity ([place]) 08/02/2013   OSA (obstructive sleep apnea)    severe OSA with AHI 74/hr now on CPAP at 8cm H2O   Past Surgical History:  Procedure Laterality Date   BIV PACEMAKER INSERTION CRT-P N/A 12/14/2018   Medtronic model [id number] Percepta Quad CRT-P MRI Surescan (serial Number [id number]) device implanted by Dr [name] for CHF and LBBB   CARDIOVERSION N/A 08/06/2013   Procedure: CARDIOVERSION;  Surgeon: Lelon Perla, MD;  Location: [place];  Service: Cardiovascular;  Laterality: N/A;  spoke with [name]    CARDIOVERSION N/A 09/03/2013   Procedure: CARDIOVERSION;  Surgeon: [name], MD;  Location: [place];  Service: Cardiovascular;  Laterality: N/A;   CARDIOVERSION N/A 07/21/2021  Procedure: CARDIOVERSION;  Surgeon: Larey Dresser, MD;  Location: [facility] ENDOSCOPY;  Service: Cardiovascular;  Laterality: N/A;   CARDIOVERSION N/A 08/13/2022   Procedure: CARDIOVERSION;  Surgeon: Josue Hector, MD;  Location: [facility] ENDOSCOPY;  Service: Cardiovascular;  Laterality: N/A;   chronic systolic dysfu     NASAL SEPTUM SURGERY     RIGHT/LEFT HEART CATH AND CORONARY ANGIOGRAPHY N/A 12/04/2018   Procedure: RIGHT/LEFT HEART CATH AND CORONARY ANGIOGRAPHY;  Surgeon: Belva Crome, MD;  Location: [place] CV LAB;  Service: Cardiovascular;  Laterality: N/A;   TEE WITHOUT CARDIOVERSION N/A 08/06/2013   Procedure: TRANSESOPHAGEAL ECHOCARDIOGRAM (TEE);  Surgeon: Lelon Perla, MD;   Location: [facility] ENDOSCOPY;  Service: Cardiovascular;  Laterality: N/A;   VASECTOMY       Current Outpatient Medications  Medication Sig Dispense Refill   acetaminophen (TYLENOL) 650 MG CR tablet Take 1,300 mg by mouth in the morning and at bedtime. Arthritis strength     ADVAIR DISKUS 250-50 MCG/DOSE AEPB Inhale 1 puff into the lungs 2 (two) times daily.     amiodarone (PACERONE) 200 MG tablet Take 1 tablet by mouth twice a day for 1 month then reduce to 1 tablet daily 60 tablet 0   carvedilol (COREG) 25 MG tablet Take 1 tablet (25 mg total) by mouth 2 (two) times daily with a meal. 180 tablet 3   cholecalciferol (VITAMIN D3) 25 MCG (1000 UT) tablet Take 1,000 Units by mouth daily.     CIALIS 20 MG tablet Take 20 mg by mouth daily as needed for erectile dysfunction.   0   cyclobenzaprine (FLEXERIL) 10 MG tablet Take 10 mg by mouth 3 (three) times daily as needed for muscle spasms.     ELIQUIS 5 MG TABS tablet TAKE 1 TABLET(5 MG) BY MOUTH TWICE DAILY 180 tablet 2   empagliflozin (JARDIANCE) 10 MG TABS tablet Take 1 tablet (10 mg total) by mouth daily before breakfast. 90 tablet 3   ENTRESTO 97-103 MG TAKE 1 TABLET BY MOUTH TWICE DAILY 180 tablet 3   fluticasone (FLONASE) 50 MCG/ACT nasal spray Place 1 spray into both nostrils 2 (two) times daily.   0   furosemide (LASIX) 40 MG tablet TAKE 2 TABLETS(80 MG) BY MOUTH TWICE DAILY 360 tablet 2   Multiple Vitamins-Minerals (PRESERVISION AREDS PO) Take 1 capsule by mouth 2 (two) times daily.     omeprazole (PRILOSEC OTC) 20 MG tablet Take 20 mg by mouth daily.     potassium chloride SA (KLOR-CON M) 20 MEQ tablet Take 2 tablets (40 mEq total) by mouth daily. 180 tablet 3   spironolactone (ALDACTONE) 25 MG tablet Take 1 tablet (25 mg total) by mouth daily. 90 tablet 1   tamsulosin (FLOMAX) 0.4 MG CAPS capsule Take 0.4 mg by mouth daily.     vitamin B-12 (CYANOCOBALAMIN) 1000 MCG tablet Take 1,000 mcg by mouth daily.     zolpidem (AMBIEN CR) 12.5 MG CR  tablet Take 1 tablet by mouth at bedtime as needed for sleep.     No current facility-administered medications for this encounter.    Allergies:   Ace inhibitors and Angiotensin receptor blockers   Social History:  The patient  reports that he quit smoking about 10 years ago. His smoking use included cigarettes and cigars. He has a 64.50 pack-year smoking history. He has never used smokeless tobacco. He reports current alcohol use. He reports that he does not use drugs.   Family History:  The patient's family history  includes Atrial fibrillation in his mother; COPD in his father; Cerebral aneurysm in his brother; Healthy in his daughter.   ROS:  Please see the history of present illness.   All other systems are personally reviewed and negative.   Recent Labs: 08/06/2022: B Natriuretic Peptide 104.5; Hemoglobin 14.7; Platelets 246 08/09/2022: BUN 33; Creatinine, Ser 1.68; Potassium 4.2; Sodium 139  Personally reviewed   Wt Readings from Last 3 Encounters:  09/02/22 (!) 140 kg (308 lb 9.6 oz)  08/13/22 (!) 137.5 kg (303 lb 2.1 oz)  08/09/22 (!) 137.5 kg (303 lb 3.2 oz)    BP 102/60   Pulse 74   Wt (!) 140 kg (308 lb 9.6 oz)   SpO2 95%   BMI 43.04 kg/m   Physical Exam General:  Obese male. No resp difficulty HEENT: normal Neck: supple. JVP up. Carotids 2+ bilat; no bruits. No lymphadenopathy or thryomegaly appreciated. Cor: PMI nondisplaced. Regular rate & rhythm. No rubs, gallops or murmurs. Lungs: clear Abdomen: obese soft, nontender, nondistended. No hepatosplenomegaly. No bruits or masses. Good bowel sounds. Extremities: no cyanosis, clubbing, rash, 1+ edema Neuro: alert & orientedx3, cranial nerves grossly intact. moves all 4 extremities w/o difficulty. Affect pleasant   ECG (personally reviewed): AF 90 bpm  Device interrogation (personally reviewed): AF since early September. Optivol up .Personally reviewed  ASSESSMENT AND PLAN: 1. Acute on Chronic Combined  Systolic/Diastolic HF - NICM (thought due to LBBB). 12/2018 LHC no coronary disease.  - s/p Medtronic CRT-P - Echo (1/19) EF 35-40%. ECHO 11/19 EF 45-50%.  - Echo (11/20): EF read as 35-40% but with Definity I felt closed to 45% - Echo (3/21): EF read as 35-40% I think 40-45% - Echo EF (2/22): EF 45% (in some images 45-50%) Limited by septal paradox. - CPX test (10/20) suboptimal effort mostly limited to obesity  - Echo (5/23) EF 50-55%, LV dyysynchrony, grade I DD, RV ok.  - Worse NYHA IIIb, in setting of recurrent volume overload Likely due to recurrent AF. - ICD interrogation done personally. Results as above - Continue Lasix 80 mg bid. Add metolazone 2.5 q M/F + kcl 40 with each dose.  - Arrange for DC-CV - Continue carvedilol 25 mg bid. - Continue Entresto 97-103 mg bid. - Continue spiro 25 mg daily. - Continue Jadiance 10 mg daily.   2. PAF  - In the past he was on amiodarone. Stopped ~2017 - Had recurrent AF. Underwent DC-CV 8/22. Followed by AF Clinic.  - CHA2DS2Vasc is 3. - Continue Eliquis 5 bid. No bleeding. He has not missed any doses. - Failed recent DC-CV - Seen by AF Clinic and started amio 200 bid - Will plan repeat DC-CV now with amio support - Does not tolerate AF well. Previously not felt to be ablation candidate due to size. He has lost 40 pounds but BMI still high (43). Has f/u with Dr. Quentin Ore next month to discuss.     3. OSA - Continue CPAP.   4. Obesity -  Body mass index is 43.04 kg/m.  -  Continue weight loss efforts. Recently lost 40 pounds -  Insurance denied [employer].  5. COPD  - No longer smoking.  6. CKD 3a - Last SCr 1.5-1.6 - Continue SGLT2i. - BMET today. - Has been referred to Nephrology - Watch renal function with metolazone.  - Check BMET today and in 2-3 weeks    Glori Bickers, MD  09/02/2022 12:07 PM  [facility] [address]  [facility] [place]  [address] [phone] (office) [phone] (fax)

## 2022-09-03 ENCOUNTER — Other Ambulatory Visit: Payer: Self-pay

## 2022-09-03 ENCOUNTER — Ambulatory Visit (HOSPITAL_COMMUNITY): Payer: 59 | Admitting: Anesthesiology

## 2022-09-03 ENCOUNTER — Ambulatory Visit (HOSPITAL_COMMUNITY)
Admission: RE | Admit: 2022-09-03 | Discharge: 2022-09-03 | Disposition: A | Discharge status: Home / Self Care | Payer: 59 | Attending: Internal Medicine | Admitting: Internal Medicine

## 2022-09-03 ENCOUNTER — Ambulatory Visit (HOSPITAL_BASED_OUTPATIENT_CLINIC_OR_DEPARTMENT_OTHER): Payer: 59 | Admitting: Anesthesiology

## 2022-09-03 ENCOUNTER — Encounter (HOSPITAL_COMMUNITY)
Admission: RE | Disposition: A | Discharge status: Home / Self Care | Payer: Self-pay | Source: Home / Self Care | Attending: Internal Medicine

## 2022-09-03 DIAGNOSIS — I130 Hypertensive heart and chronic kidney disease with heart failure and stage 1 through stage 4 chronic kidney disease, or unspecified chronic kidney disease: Secondary | ICD-10-CM | POA: Diagnosis not present

## 2022-09-03 DIAGNOSIS — Z87891 Personal history of nicotine dependence: Secondary | ICD-10-CM | POA: Insufficient documentation

## 2022-09-03 DIAGNOSIS — Z79899 Other long term (current) drug therapy: Secondary | ICD-10-CM | POA: Diagnosis not present

## 2022-09-03 DIAGNOSIS — Z7901 Long term (current) use of anticoagulants: Secondary | ICD-10-CM | POA: Insufficient documentation

## 2022-09-03 DIAGNOSIS — I447 Left bundle-branch block, unspecified: Secondary | ICD-10-CM | POA: Diagnosis not present

## 2022-09-03 DIAGNOSIS — Z6841 Body Mass Index (BMI) 40.0 and over, adult: Secondary | ICD-10-CM | POA: Insufficient documentation

## 2022-09-03 DIAGNOSIS — I480 Paroxysmal atrial fibrillation: Secondary | ICD-10-CM | POA: Insufficient documentation

## 2022-09-03 DIAGNOSIS — Z9989 Dependence on other enabling machines and devices: Secondary | ICD-10-CM

## 2022-09-03 DIAGNOSIS — I700 Atherosclerosis of aorta: Secondary | ICD-10-CM | POA: Insufficient documentation

## 2022-09-03 DIAGNOSIS — Z7951 Long term (current) use of inhaled steroids: Secondary | ICD-10-CM | POA: Diagnosis not present

## 2022-09-03 DIAGNOSIS — I5043 Acute on chronic combined systolic (congestive) and diastolic (congestive) heart failure: Secondary | ICD-10-CM | POA: Diagnosis not present

## 2022-09-03 DIAGNOSIS — K219 Gastro-esophageal reflux disease without esophagitis: Secondary | ICD-10-CM | POA: Diagnosis not present

## 2022-09-03 DIAGNOSIS — Z4502 Encounter for adjustment and management of automatic implantable cardiac defibrillator: Secondary | ICD-10-CM | POA: Insufficient documentation

## 2022-09-03 DIAGNOSIS — J449 Chronic obstructive pulmonary disease, unspecified: Secondary | ICD-10-CM | POA: Diagnosis not present

## 2022-09-03 DIAGNOSIS — I4891 Unspecified atrial fibrillation: Secondary | ICD-10-CM

## 2022-09-03 DIAGNOSIS — N1831 Chronic kidney disease, stage 3a: Secondary | ICD-10-CM | POA: Diagnosis not present

## 2022-09-03 DIAGNOSIS — Z7984 Long term (current) use of oral hypoglycemic drugs: Secondary | ICD-10-CM | POA: Insufficient documentation

## 2022-09-03 DIAGNOSIS — G4733 Obstructive sleep apnea (adult) (pediatric): Secondary | ICD-10-CM

## 2022-09-03 HISTORY — PX: CARDIOVERSION: SHX1299

## 2022-09-03 SURGERY — CARDIOVERSION
Anesthesia: General

## 2022-09-03 MED ORDER — SODIUM CHLORIDE 0.9 % IV SOLN: INTRAVENOUS | Status: DC

## 2022-09-03 MED ORDER — LIDOCAINE 2% (20 MG/ML) 5 ML SYRINGE
INTRAMUSCULAR | Status: DC | PRN
  Administered 2022-09-03: 60 mg via INTRAVENOUS

## 2022-09-03 MED ORDER — PROPOFOL 10 MG/ML IV BOLUS
INTRAVENOUS | Status: DC | PRN
  Administered 2022-09-03: 20 mg via INTRAVENOUS
  Administered 2022-09-03: 80 mg via INTRAVENOUS

## 2022-09-03 NOTE — Transfer of Care (Signed)
Immediate Anesthesia Transfer of Care Note  Patient: Nicholas Foley  Procedure(s) Performed: CARDIOVERSION  Patient Location: Endoscopy Unit  Anesthesia Type:MAC  Level of Consciousness: drowsy and patient cooperative  Airway & Oxygen Therapy: Patient Spontanous Breathing  Post-op Assessment: Report given to RN, Post -op Vital signs reviewed and stable and Patient moving all extremities X 4  Post vital signs: Reviewed and stable  Last Vitals:  Vitals Value Taken Time  BP 115/71   Temp    Pulse 69   Resp 22   SpO2 97     Last Pain:  Vitals:   09/03/22 1102  TempSrc: Temporal  PainSc: 0-No pain         Complications: No notable events documented.

## 2022-09-03 NOTE — Interval H&P Note (Signed)
History and Physical Interval Note:  09/03/2022 12:39 PM  [name]  has presented today for surgery, with the diagnosis of afib - syptomatic.  The various methods of treatment have been discussed with the patient and family. After consideration of risks, benefits and other options for treatment, the patient has consented to  Procedure(s): CARDIOVERSION (N/A) as a surgical intervention.  The patient's history has been reviewed, patient examined, no change in status, stable for surgery.  I have reviewed the patient's chart and labs.  Questions were answered to the patient's satisfaction.     Nicholas Foley

## 2022-09-03 NOTE — Discharge Instructions (Signed)

## 2022-09-03 NOTE — CV Procedure (Signed)
    DIRECT CURRENT CARDIOVERSION  NAME:  Nicholas Foley   MRN: [id number] DOB:  09-21-1951   ADMIT DATE: 09/03/2022   INDICATIONS: Atrial fibrillation    PROCEDURE:   Informed consent was obtained prior to the procedure. The risks, benefits and alternatives for the procedure were discussed and the patient comprehended these risks. Once an appropriate time out was taken, the patient had the defibrillator pads placed in the anterior and posterior position. The patient then underwent sedation by the anesthesia service. Once an appropriate level of sedation was achieved, the patient received a single biphasic, synchronized 200J shock which failed to convert him. We repositioned the pads and delivered a second 200J shock with firm manual pressure on the anterior pad but patient still did not convert.   Glori Bickers, MD  1:05 PM

## 2022-09-03 NOTE — Anesthesia Postprocedure Evaluation (Signed)
Anesthesia Post Note  Patient: [name]  Procedure(s) Performed: CARDIOVERSION     Patient location during evaluation: PACU Anesthesia Type: General Level of consciousness: awake and alert, oriented and patient cooperative Pain management: pain level controlled Vital Signs Assessment: post-procedure vital signs reviewed and stable Respiratory status: spontaneous breathing, nonlabored ventilation and respiratory function stable Cardiovascular status: blood pressure returned to baseline and stable Postop Assessment: no apparent nausea or vomiting Anesthetic complications: no   No notable events documented.  Last Vitals:  Vitals:   09/03/22 1300 09/03/22 1310  BP: 101/74 112/74  Pulse: 70 63  Resp: (!) 22 18  Temp:    SpO2: 94% 95%    Last Pain:  Vitals:   09/03/22 1254  TempSrc:   PainSc: 0-No pain                 Pervis Hocking

## 2022-09-03 NOTE — Anesthesia Preprocedure Evaluation (Addendum)
Anesthesia Evaluation  Patient identified by MRN, date of birth, ID band Patient awake    Reviewed: Allergy & Precautions, NPO status , Patient's Chart, lab work & pertinent test results  Airway Mallampati: III  TM Distance: >3 FB Neck ROM: Full    Dental no notable dental hx.    Pulmonary sleep apnea (severe OSA, CPAP 8) and Continuous Positive Airway Pressure Ventilation , COPD, former smoker,  Quit smoking 2013, 65 pack year history    Pulmonary exam normal breath sounds clear to auscultation       Cardiovascular hypertension, Pt. on medications +CHF (LVEF 50-55% on most recent echo, was previously as low as 35-40% )  Normal cardiovascular exam+ dysrhythmias Atrial Fibrillation + pacemaker  Rhythm:Regular Rate:Normal  Last echo 1. There is significant LV dyysynchrony. Left ventricular ejection fraction, by estimation, is 50 to 55%. The left ventricle has low normal function. The left ventricle has no regional wall motion abnormalities.  There is moderate concentric left  ventricular hypertrophy. Left ventricular diastolic parameters are consistent with Grade I diastolic dysfunction (impaired relaxation).  2. Right ventricular systolic function is normal. The right ventricular size is normal.  3. Left atrial size was mildly dilated.  4. The mitral valve is normal in structure. No evidence of mitral valve regurgitation. No evidence of mitral stenosis.  5. The aortic valve is tricuspid. There is mild calcification of the aortic valve. Aortic valve regurgitation is not visualized. No aortic stenosis is present.  6. The inferior vena cava is normal in size with greater than 50% respiratory variability, suggesting right atrial pressure of 3 mmHg.    Neuro/Psych negative neurological ROS  negative psych ROS   GI/Hepatic Neg liver ROS, GERD  Medicated and Controlled,  Endo/Other  Morbid obesityBMI 43  Renal/GU Renal  InsufficiencyRenal diseaseCr 1.53  negative genitourinary   Musculoskeletal negative musculoskeletal ROS (+)   Abdominal   Peds  Hematology negative hematology ROS (+)   Anesthesia Other Findings   Reproductive/Obstetrics negative OB ROS                           Anesthesia Physical Anesthesia Plan  ASA: 3  Anesthesia Plan: General   Post-op Pain Management:    Induction: Intravenous  PONV Risk Score and Plan: 2 and Propofol infusion, TIVA and Treatment may vary due to age or medical condition  Airway Management Planned: Mask and Natural Airway  Additional Equipment:   Intra-op Plan:   Post-operative Plan:   Informed Consent: I have reviewed the patients History and Physical, chart, labs and discussed the procedure including the risks, benefits and alternatives for the proposed anesthesia with the patient or authorized representative who has indicated his/her understanding and acceptance.       Plan Discussed with: CRNA  Anesthesia Plan Comments:        Anesthesia Quick Evaluation

## 2022-09-06 ENCOUNTER — Ambulatory Visit (INDEPENDENT_AMBULATORY_CARE_PROVIDER_SITE_OTHER): Payer: 59

## 2022-09-06 ENCOUNTER — Telehealth (HOSPITAL_COMMUNITY): Payer: Self-pay | Admitting: *Deleted

## 2022-09-06 ENCOUNTER — Other Ambulatory Visit: Payer: Self-pay | Admitting: Physician Assistant

## 2022-09-06 DIAGNOSIS — I5042 Chronic combined systolic (congestive) and diastolic (congestive) heart failure: Secondary | ICD-10-CM

## 2022-09-06 DIAGNOSIS — Z950 Presence of cardiac pacemaker: Secondary | ICD-10-CM | POA: Diagnosis not present

## 2022-09-06 NOTE — Telephone Encounter (Signed)
Pt called stating he has felt terrible since his cardioversion pt said he is short of breath and very fatigued and dizzy.  Bp 110-114/70's heart rate 60s.   Routed to [place]

## 2022-09-06 NOTE — Telephone Encounter (Signed)
This is a CHF pt 

## 2022-09-07 ENCOUNTER — Encounter (HOSPITAL_COMMUNITY): Payer: Self-pay | Admitting: Internal Medicine

## 2022-09-07 ENCOUNTER — Other Ambulatory Visit (HOSPITAL_COMMUNITY): Payer: Self-pay

## 2022-09-07 MED ORDER — AMIODARONE HCL 200 MG PO TABS: ORAL_TABLET | ORAL | 0 refills | Status: DC

## 2022-09-08 ENCOUNTER — Telehealth (HOSPITAL_COMMUNITY): Payer: Self-pay | Admitting: *Deleted

## 2022-09-08 ENCOUNTER — Encounter (HOSPITAL_COMMUNITY): Payer: Self-pay | Admitting: *Deleted

## 2022-09-08 NOTE — Telephone Encounter (Signed)
Called pt no answer. Mychart message sent.  

## 2022-09-08 NOTE — Telephone Encounter (Signed)
Optivol report sent to [place] in a telephone note

## 2022-09-10 NOTE — Telephone Encounter (Signed)
Mychart msg sent 10/18

## 2022-09-10 NOTE — Progress Notes (Signed)
EPIC Encounter for ICM Monitoring  Patient Name: Nicholas Foley is a 71 y.o. male Date: 09/10/2022 Primary Care Physican: [name], MD Primary Cardiologist: Smith/Bensimhon Electrophysiologist: Marisa Sprinkles Pacing:  65.1% 03/17/2022 Weight: 300 lbs  04/23/2022 Weight: Not weighing at home           Spoke with patient and heart failure questions reviewed.  Pt reports he is not doing well and still feeling poorly. He has appointment with about ablation.  He reports feeling weak, faint and SOB when taking Amiodarone twice a day and since the dosage was decreased to once a day and feeling better.          Optivol thoracic impedance suggesting fluid levels returned normal after taking Metolazone.   Prescribed: Furosemide 40 mg take 2 tablets (80 mg total) twice a day.  Pt reports he self adjust Furosemide and Dr Haroldine Laws is aware. Metolazone 2.5 mg take 1 tablet by mouth two times a week Potassium 20 mEq take 1 tablet daily   Labs: 09/02/2022 Creatinine 1.53, BUN 18, Potassium 4.1, Sodium 139, GFR 49 08/09/2022 Creatinine 1.68, BUN 33, Potassium 4.2, Sodium 139, GFR 44  08/06/2022 Creatinine 1.51, BUN 18, Potassium 3.1, Sodium 139, GFR 50  07/15/2021 Creatinine 1.08, BUN 17, Potassium 3.7, Sodium 138. GFR >60 05/04/2021 Creatinine 1.25, BUN 20, Potassium 4.4, Sodium 136, GFR 63 01/07/2021 Creatinine 1.20, BUN 17, Potassium 3.7, Sodium 139, GFR >60 A complete set of results can be found in Results Review.   Recommendations:   He will call if condition changes and encouraged to do so.    Follow-up plan: ICM clinic phone appointment on 10/18/2022.   91 day device clinic remote transmission 10/20/2022.     EP/Cardiology Office Visits:   09/23/2022 with Dr Quentin Ore.  11/16/2022 with Dr Haroldine Laws.  Recall 03/14/2023 with Dr [name].   Copy of ICM check sent to Dr Quentin Ore.   3 month ICM trend: 09/07/2022.    12-14 Month ICM trend:     Rosalene Billings, RN 09/10/2022 2:31 PM

## 2022-09-15 ENCOUNTER — Encounter (INDEPENDENT_AMBULATORY_CARE_PROVIDER_SITE_OTHER): Payer: 59 | Admitting: Ophthalmology

## 2022-09-15 DIAGNOSIS — I10 Essential (primary) hypertension: Secondary | ICD-10-CM | POA: Diagnosis not present

## 2022-09-15 DIAGNOSIS — H35033 Hypertensive retinopathy, bilateral: Secondary | ICD-10-CM

## 2022-09-15 DIAGNOSIS — H43813 Vitreous degeneration, bilateral: Secondary | ICD-10-CM

## 2022-09-15 DIAGNOSIS — H353122 Nonexudative age-related macular degeneration, left eye, intermediate dry stage: Secondary | ICD-10-CM

## 2022-09-15 DIAGNOSIS — D1809 Hemangioma of other sites: Secondary | ICD-10-CM

## 2022-09-15 DIAGNOSIS — H33302 Unspecified retinal break, left eye: Secondary | ICD-10-CM

## 2022-09-16 ENCOUNTER — Other Ambulatory Visit: Payer: Self-pay | Admitting: Internal Medicine

## 2022-09-20 ENCOUNTER — Other Ambulatory Visit (HOSPITAL_COMMUNITY): Payer: 59

## 2022-09-21 ENCOUNTER — Ambulatory Visit (HOSPITAL_COMMUNITY)
Admission: RE | Admit: 2022-09-21 | Discharge: 2022-09-21 | Disposition: A | Discharge status: Home / Self Care | Payer: 59 | Source: Ambulatory Visit | Attending: Cardiology | Admitting: Cardiology

## 2022-09-21 DIAGNOSIS — I5042 Chronic combined systolic (congestive) and diastolic (congestive) heart failure: Secondary | ICD-10-CM | POA: Insufficient documentation

## 2022-09-21 LAB — BASIC METABOLIC PANEL
Anion gap: 13 (ref 5–15)
BUN: 62 mg/dL — ABNORMAL HIGH (ref 8–23)
CO2: 25 mmol/L (ref 22–32)
Calcium: 9 mg/dL (ref 8.9–10.3)
Chloride: 98 mmol/L (ref 98–111)
Creatinine, Ser: 2.57 mg/dL — ABNORMAL HIGH (ref 0.61–1.24)
GFR, Estimated: 26 mL/min — ABNORMAL LOW (ref >60)
Glucose, Bld: 146 mg/dL — ABNORMAL HIGH (ref 70–99)
Potassium: 4.3 mmol/L (ref 3.5–5.1)
Sodium: 136 mmol/L (ref 135–145)

## 2022-09-23 ENCOUNTER — Encounter: Payer: Self-pay | Admitting: Cardiology

## 2022-09-23 ENCOUNTER — Ambulatory Visit: Payer: 59 | Attending: Cardiology | Admitting: Cardiology

## 2022-09-23 VITALS — BP 108/58 | HR 87 | Ht 71.0 in | Wt 300.0 lb

## 2022-09-23 DIAGNOSIS — Z01818 Encounter for other preprocedural examination: Secondary | ICD-10-CM

## 2022-09-23 DIAGNOSIS — I5042 Chronic combined systolic (congestive) and diastolic (congestive) heart failure: Secondary | ICD-10-CM | POA: Diagnosis not present

## 2022-09-23 DIAGNOSIS — I4819 Other persistent atrial fibrillation: Secondary | ICD-10-CM

## 2022-09-23 DIAGNOSIS — Z950 Presence of cardiac pacemaker: Secondary | ICD-10-CM | POA: Diagnosis not present

## 2022-09-23 LAB — CUP PACEART INCLINIC DEVICE CHECK
Battery Remaining Longevity: 81 mo
Battery Voltage: 2.96 V
Brady Statistic RA Percent Paced: 6.46 %
Brady Statistic RV Percent Paced: 65.44 %
Date Time Interrogation Session: 20231102201646
Implantable Lead Connection Status: 753985
Implantable Lead Connection Status: 753985
Implantable Lead Connection Status: 753985
Implantable Lead Implant Date: 20200123
Implantable Lead Implant Date: 20200123
Implantable Lead Implant Date: 20200123
Implantable Lead Location: 753858
Implantable Lead Location: 753859
Implantable Lead Location: 753860
Implantable Lead Model: 4598
Implantable Lead Model: 5076
Implantable Lead Model: 5076
Implantable Pulse Generator Implant Date: 20200123
Lead Channel Impedance Value: 1026 Ohm
Lead Channel Impedance Value: 1235 Ohm
Lead Channel Impedance Value: 1330 Ohm
Lead Channel Impedance Value: 1368 Ohm
Lead Channel Impedance Value: 1368 Ohm
Lead Channel Impedance Value: 1748 Ohm
Lead Channel Impedance Value: 1748 Ohm
Lead Channel Impedance Value: 418 Ohm
Lead Channel Impedance Value: 456 Ohm
Lead Channel Impedance Value: 475 Ohm
Lead Channel Impedance Value: 494 Ohm
Lead Channel Impedance Value: 551 Ohm
Lead Channel Impedance Value: 912 Ohm
Lead Channel Impedance Value: 988 Ohm
Lead Channel Pacing Threshold Amplitude: 0.5 V
Lead Channel Pacing Threshold Amplitude: 3.25 V
Lead Channel Pacing Threshold Pulse Width: 0.4 ms
Lead Channel Pacing Threshold Pulse Width: 0.5 ms
Lead Channel Sensing Intrinsic Amplitude: 0.375 mV
Lead Channel Sensing Intrinsic Amplitude: 0.9 mV
Lead Channel Sensing Intrinsic Amplitude: 5 mV
Lead Channel Sensing Intrinsic Amplitude: 6.25 mV
Lead Channel Setting Pacing Amplitude: 1.5 V
Lead Channel Setting Pacing Amplitude: 2.5 V
Lead Channel Setting Pacing Amplitude: 3.25 V
Lead Channel Setting Pacing Pulse Width: 0.4 ms
Lead Channel Setting Pacing Pulse Width: 0.5 ms
Lead Channel Setting Sensing Sensitivity: 2 mV
Zone Setting Status: 755011
Zone Setting Status: 755011

## 2022-09-23 MED ORDER — FUROSEMIDE 80 MG PO TABS: ORAL_TABLET | ORAL | 3 refills | Status: DC

## 2022-09-23 NOTE — Progress Notes (Signed)
Electrophysiology Office Follow up Visit Note:    Date:  09/23/2022   ID:  Nicholas Foley, DOB [birth year], MRN [id number]  PCP:  [name], MD  [facility] Cardiologist:  [name], MD  [facility] Electrophysiologist:  Vickie Epley, MD    Interval History:    Nicholas Foley is a 71 y.o. male who presents for a follow up visit.   He previously followed w Dr [name].  He has a history of obesity, chronic systolic HF, COPD, OSA, pers AF, HTN, CRT-P. His HF is nonischemic.  Previously on amiodarone. Previous DCCV. Recurrent AF in September. DCCV 08/13/2022 failed at 150 and 200J.  Today, he reports significantly worsening exercise intolerance in the past 6-8 weeks. Prior to that he was working with a Clinical research associate and exercising at the gym. He had lost 40 lbs. Two weeks ago he was still able to exercise at the gym, although not as intensely. Today he had his wife drive him to his appointment as he becomes short of breath with minimal walking. When he is able, he plans to keep working on his weight loss.  Currently he is taking 2.5 mg metolazone twice a week. He is also on 80 mg Lasix BID. As of 09/21/22 his creatinine was 2.57.  He continues to work full time.   He denies any palpitations, chest pain, or peripheral edema. No lightheadedness, headaches, syncope, orthopnea, or PND.      Past Medical History:  Diagnosis Date   Atrial fibrillation with rapid ventricular response ([place]) 08/02/2013   CHF (congestive heart failure) (HCC)    Chronic systolic dysfunction of left ventricle 08/02/2013   BNP greater than 2000    COPD (chronic obstructive pulmonary disease) (HCC)    Moderate by PFTs with response to bronchodilators, May 2014   Erectile dysfunction    Hypertension    Insomnia 08/02/13   Left bundle branch block 08/02/2013   Morbid obesity ([place]) 08/02/2013   OSA (obstructive sleep apnea)    severe OSA with AHI 74/hr now on CPAP at 8cm H2O    Past Surgical  History:  Procedure Laterality Date   BIV PACEMAKER INSERTION CRT-P N/A 12/14/2018   Medtronic model C1KG81 Percepta Quad CRT-P MRI Surescan (serial Number [id number]) device implanted by Dr [name] for CHF and LBBB   CARDIOVERSION N/A 08/06/2013   Procedure: CARDIOVERSION;  Surgeon: Lelon Perla, MD;  Location: [place];  Service: Cardiovascular;  Laterality: N/A;  spoke with [name]    CARDIOVERSION N/A 09/03/2013   Procedure: CARDIOVERSION;  Surgeon: [name], MD;  Location: [place];  Service: Cardiovascular;  Laterality: N/A;   CARDIOVERSION N/A 07/21/2021   Procedure: CARDIOVERSION;  Surgeon: Larey Dresser, MD;  Location: [facility] ENDOSCOPY;  Service: Cardiovascular;  Laterality: N/A;   CARDIOVERSION N/A 08/13/2022   Procedure: CARDIOVERSION;  Surgeon: Josue Hector, MD;  Location: [facility] ENDOSCOPY;  Service: Cardiovascular;  Laterality: N/A;   CARDIOVERSION N/A 09/03/2022   Procedure: CARDIOVERSION;  Surgeon: Jolaine Artist, MD;  Location: [facility] ENDOSCOPY;  Service: Cardiovascular;  Laterality: N/A;   chronic systolic dysfu     NASAL SEPTUM SURGERY     RIGHT/LEFT HEART CATH AND CORONARY ANGIOGRAPHY N/A 12/04/2018   Procedure: RIGHT/LEFT HEART CATH AND CORONARY ANGIOGRAPHY;  Surgeon: Belva Crome, MD;  Location: [facility];  Service: Cardiovascular;  Laterality: N/A;   TEE WITHOUT CARDIOVERSION N/A 08/06/2013   Procedure: TRANSESOPHAGEAL ECHOCARDIOGRAM (TEE);  Surgeon: Lelon Perla, MD;  Location: [facility] ENDOSCOPY;  Service: Cardiovascular;  Laterality: N/A;   VASECTOMY      Current Medications: Current Meds  Medication Sig   acetaminophen (TYLENOL) 650 MG CR tablet Take 1,300 mg by mouth in the morning and at bedtime. Arthritis strength   ADVAIR DISKUS 250-50 MCG/DOSE AEPB Inhale 1 puff into the lungs 2 (two) times daily.   amiodarone (PACERONE) 200 MG tablet Take 1 tablet by mouth twice a day for 1 month then reduce to 1 tablet daily   carvedilol (COREG) 25  MG tablet Take 1 tablet (25 mg total) by mouth 2 (two) times daily with a meal.   cholecalciferol (VITAMIN D3) 25 MCG (1000 UT) tablet Take 1,000 Units by mouth daily.   CIALIS 20 MG tablet Take 20 mg by mouth daily as needed for erectile dysfunction.    cyclobenzaprine (FLEXERIL) 10 MG tablet Take 10 mg by mouth 3 (three) times daily as needed for muscle spasms.   ELIQUIS 5 MG TABS tablet TAKE 1 TABLET(5 MG) BY MOUTH TWICE DAILY   empagliflozin (JARDIANCE) 10 MG TABS tablet Take 1 tablet (10 mg total) by mouth daily before breakfast.   ENTRESTO 97-103 MG TAKE 1 TABLET BY MOUTH TWICE DAILY   fluticasone (FLONASE) 50 MCG/ACT nasal spray Place 1 spray into both nostrils 2 (two) times daily.    furosemide (LASIX) 40 MG tablet TAKE 2 TABLETS(80 MG) BY MOUTH TWICE DAILY   metolazone (ZAROXOLYN) 2.5 MG tablet Take 1 tablet (2.5 mg total) by mouth 2 (two) times a week.   Multiple Vitamins-Minerals (PRESERVISION AREDS PO) Take 1 capsule by mouth 2 (two) times daily.   omeprazole (PRILOSEC OTC) 20 MG tablet Take 20 mg by mouth daily.   potassium chloride SA (KLOR-CON M) 20 MEQ tablet Take 2 tablets (40 mEq total) by mouth daily. (Patient taking differently: Take 20 mEq by mouth See admin instructions. 1 tablet twice daily, except on Mon and Fri take 3 tabs in the morning, 1 tab in the evening)   spironolactone (ALDACTONE) 25 MG tablet Take 1 tablet (25 mg total) by mouth daily.   tamsulosin (FLOMAX) 0.4 MG CAPS capsule Take 0.4 mg by mouth daily.   vitamin B-12 (CYANOCOBALAMIN) 1000 MCG tablet Take 1,000 mcg by mouth daily.   zolpidem (AMBIEN CR) 12.5 MG CR tablet Take 12.5 mg by mouth at bedtime as needed for sleep.     Allergies:   Ace inhibitors and Angiotensin receptor blockers   Social History   Socioeconomic History   Marital status: Married    Spouse name: Not on file   Number of children: Not on file   Years of education: Not on file   Highest education level: Not on file  Occupational  History   Occupation: real estate    Comment: investment  Tobacco Use   Smoking status: Former    Packs/day: 1.50    Years: 43.00    Total pack years: 64.50    Types: Cigarettes, Cigars    Quit date: 08/02/2012    Years since quitting: 10.1   Smokeless tobacco: Never  Vaping Use   Vaping Use: Never used  Substance and Sexual Activity   Alcohol use: Yes    Alcohol/week: 0.0 standard drinks of alcohol    Comment: once a week; previously 3-4 times per week    Drug use: No   Sexual activity: Yes  Other Topics Concern   Not on file  Social History Narrative   Works as a Personal assistant  developer for apartments   Lives with wife.  They have one grown daughter   Highest level of education:  college   Social Determinants of Health   Financial Resource Strain: Not on file  Food Insecurity: Not on file  Transportation Needs: Not on file  Physical Activity: Not on file  Stress: Not on file  Social Connections: Not on file     Family History: The patient's family history includes Atrial fibrillation in his mother; COPD in his father; Cerebral aneurysm in his brother; Healthy in his daughter.  ROS:   Please see the history of present illness.    (+) Shortness of breath (+) Fatigue All other systems reviewed and are negative.  EKGs/Labs/Other Studies Reviewed:    The following studies were reviewed today:  04/12/2022 Echo LV 50 RV normal LA mildly dilated  09/23/2022 in clinic device interrogation personally reviewed AF, VS 40 bpm presenting Battery longevity 6.8 years Lead parameters stable Decreased LV lead output to maximize battery longevity Graphs show decreased biV pacing since pers AF in September   EKG:  The ekg ordered today demonstrates AF with biV pacing.  Recent Labs: 08/06/2022: B Natriuretic Peptide 104.5; Hemoglobin 14.7; Platelets 246 09/21/2022: BUN 62; Creatinine, Ser 2.57; Potassium 4.3; Sodium 136   Recent Lipid Panel    Component Value Date/Time    CHOL 184 08/03/2013 0640   TRIG 149 08/03/2013 0640   HDL 50 08/03/2013 0640   CHOLHDL 3.7 08/03/2013 0640   VLDL 30 08/03/2013 0640   LDLCALC 104 (H) 08/03/2013 0640    Physical Exam:    VS:  BP (!) 108/58   Pulse 87   Ht '5\' 11"'$  (1.803 m)   Wt 300 lb (136.1 kg)   SpO2 98%   BMI 41.84 kg/m     Wt Readings from Last 3 Encounters:  09/23/22 300 lb (136.1 kg)  09/02/22 (!) 308 lb 9.6 oz (140 kg)  08/13/22 (!) 303 lb 2.1 oz (137.5 kg)     GEN: Well nourished, well developed in no acute distress. obese HEENT: Normal NECK: No JVD; No carotid bruits LYMPHATICS: No lymphadenopathy CARDIAC: Irregularly irregular, no murmurs, rubs, gallops RESPIRATORY:  Clear to auscultation without rales, wheezing or rhonchi  ABDOMEN: Soft, non-tender, non-distended MUSCULOSKELETAL:  No edema; No deformity  SKIN: Warm and dry NEUROLOGIC:  Alert and oriented x 3 PSYCHIATRIC:  Normal affect       ASSESSMENT:    1. Persistent atrial fibrillation ([place])   2. Chronic combined systolic and diastolic heart failure ([place])   3. Biventricular cardiac pacemaker in situ    PLAN:    In order of problems listed above:  #Persistent AF Previously on amio, then stopped around 2018. Recently restarted after failed DCCV in September. Rhythm control indicated given hx of HF. Continue eliquis.  Discussed treatment options today for their AF including antiarrhythmic drug therapy and ablation. Discussed risks, recovery and likelihood of success. Discussed potential need for repeat ablation procedures and antiarrhythmic drugs after an initial ablation. They wish to proceed with scheduling.  Risk, benefits, and alternatives to EP study and radiofrequency ablation for afib were also discussed in detail today. These risks include but are not limited to stroke, bleeding, vascular damage, tamponade, perforation, damage to the esophagus, lungs, and other structures, pulmonary vein stenosis, worsening renal  function, and death. The patient understands these risk and wishes to proceed.  We will therefore proceed with catheter ablation at the next available time.  Carto, ICE, anesthesia are  requested for the procedure.  Will also obtain CT PV protocol prior to the procedure to exclude LAA thrombus and further evaluate atrial anatomy.  To get him some immediate relief, I have scheduled him for a DCCV next week in the EP lab with Dr Curt Bears with a 360J defibrillator. He required higher energy shocks with compression in the past.  #CRT-P in situ Device functioning appropriately. Continue remote monitoring.  #Chronic Systolic HF NYHA class II-III. Warm and dry on exam. Follows w DB. Warm and volume down on exam, ICD metrics Change diuretic to '80mg'$  once a day alternating with twice daily Encouraged him to weigh himself daily    Medication Adjustments/Labs and Tests Ordered: Current medicines are reviewed at length with the patient today.  Concerns regarding medicines are outlined above.   No orders of the defined types were placed in this encounter.  No orders of the defined types were placed in this encounter.  I,Mathew Stumpf,acting as a Education administrator for Vickie Epley, MD.,have documented all relevant documentation on the behalf of Vickie Epley, MD,as directed by  Vickie Epley, MD while in the presence of Vickie Epley, MD.  I, Vickie Epley, MD, have reviewed all documentation for this visit. The documentation on 09/23/22 for the exam, diagnosis, procedures, and orders are all accurate and complete.   Signed, Lars Mage, MD, [facility], [facility] 09/23/2022 3:37 PM    Electrophysiology [facility]

## 2022-09-23 NOTE — H&P (View-Only) (Signed)
Electrophysiology Office Follow up Visit Note:    Date:  09/23/2022   ID:  ROMMEL Foley, DOB 1951/04/16, MRN [id number]  PCP:  [name], MD  [facility] Cardiologist:  [name], MD  [facility] Electrophysiologist:  Vickie Epley, MD    Interval History:    Nicholas Foley is a 71 y.o. male who presents for a follow up visit.   He previously followed w Dr [name].  He has a history of obesity, chronic systolic HF, COPD, OSA, pers AF, HTN, CRT-P. His HF is nonischemic.  Previously on amiodarone. Previous DCCV. Recurrent AF in September. DCCV 08/13/2022 failed at 150 and 200J.  Today, he reports significantly worsening exercise intolerance in the past 6-8 weeks. Prior to that he was working with a Clinical research associate and exercising at the gym. He had lost 40 lbs. Two weeks ago he was still able to exercise at the gym, although not as intensely. Today he had his wife drive him to his appointment as he becomes short of breath with minimal walking. When he is able, he plans to keep working on his weight loss.  Currently he is taking 2.5 mg metolazone twice a week. He is also on 80 mg Lasix BID. As of 09/21/22 his creatinine was 2.57.  He continues to work full time.   He denies any palpitations, chest pain, or peripheral edema. No lightheadedness, headaches, syncope, orthopnea, or PND.      Past Medical History:  Diagnosis Date   Atrial fibrillation with rapid ventricular response ([place]) 08/02/2013   CHF (congestive heart failure) (HCC)    Chronic systolic dysfunction of left ventricle 08/02/2013   BNP greater than 2000    COPD (chronic obstructive pulmonary disease) (HCC)    Moderate by PFTs with response to bronchodilators, May 2014   Erectile dysfunction    Hypertension    Insomnia 08/02/13   Left bundle branch block 08/02/2013   Morbid obesity ([place]) 08/02/2013   OSA (obstructive sleep apnea)    severe OSA with AHI 74/hr now on CPAP at 8cm H2O    Past Surgical  History:  Procedure Laterality Date   BIV PACEMAKER INSERTION CRT-P N/A 12/14/2018   Medtronic model [id number] Percepta Quad CRT-P MRI Surescan (serial Number [id number]) device implanted by Dr [name] for CHF and LBBB   CARDIOVERSION N/A 08/06/2013   Procedure: CARDIOVERSION;  Surgeon: Lelon Perla, MD;  Location: [place];  Service: Cardiovascular;  Laterality: N/A;  spoke with [name]    CARDIOVERSION N/A 09/03/2013   Procedure: CARDIOVERSION;  Surgeon: [name], MD;  Location: [place];  Service: Cardiovascular;  Laterality: N/A;   CARDIOVERSION N/A 07/21/2021   Procedure: CARDIOVERSION;  Surgeon: Larey Dresser, MD;  Location: [facility] ENDOSCOPY;  Service: Cardiovascular;  Laterality: N/A;   CARDIOVERSION N/A 08/13/2022   Procedure: CARDIOVERSION;  Surgeon: Josue Hector, MD;  Location: [facility] ENDOSCOPY;  Service: Cardiovascular;  Laterality: N/A;   CARDIOVERSION N/A 09/03/2022   Procedure: CARDIOVERSION;  Surgeon: Jolaine Artist, MD;  Location: [facility] ENDOSCOPY;  Service: Cardiovascular;  Laterality: N/A;   chronic systolic dysfu     NASAL SEPTUM SURGERY     RIGHT/LEFT HEART CATH AND CORONARY ANGIOGRAPHY N/A 12/04/2018   Procedure: RIGHT/LEFT HEART CATH AND CORONARY ANGIOGRAPHY;  Surgeon: Belva Crome, MD;  Location: [place] CV LAB;  Service: Cardiovascular;  Laterality: N/A;   TEE WITHOUT CARDIOVERSION N/A 08/06/2013   Procedure: TRANSESOPHAGEAL ECHOCARDIOGRAM (TEE);  Surgeon: Lelon Perla, MD;  Location: [facility] ENDOSCOPY;  Service: Cardiovascular;  Laterality: N/A;   VASECTOMY      Current Medications: Current Meds  Medication Sig   acetaminophen (TYLENOL) 650 MG CR tablet Take 1,300 mg by mouth in the morning and at bedtime. Arthritis strength   ADVAIR DISKUS 250-50 MCG/DOSE AEPB Inhale 1 puff into the lungs 2 (two) times daily.   amiodarone (PACERONE) 200 MG tablet Take 1 tablet by mouth twice a day for 1 month then reduce to 1 tablet daily   carvedilol (COREG) 25  MG tablet Take 1 tablet (25 mg total) by mouth 2 (two) times daily with a meal.   cholecalciferol (VITAMIN D3) 25 MCG (1000 UT) tablet Take 1,000 Units by mouth daily.   CIALIS 20 MG tablet Take 20 mg by mouth daily as needed for erectile dysfunction.    cyclobenzaprine (FLEXERIL) 10 MG tablet Take 10 mg by mouth 3 (three) times daily as needed for muscle spasms.   ELIQUIS 5 MG TABS tablet TAKE 1 TABLET(5 MG) BY MOUTH TWICE DAILY   empagliflozin (JARDIANCE) 10 MG TABS tablet Take 1 tablet (10 mg total) by mouth daily before breakfast.   ENTRESTO 97-103 MG TAKE 1 TABLET BY MOUTH TWICE DAILY   fluticasone (FLONASE) 50 MCG/ACT nasal spray Place 1 spray into both nostrils 2 (two) times daily.    furosemide (LASIX) 40 MG tablet TAKE 2 TABLETS(80 MG) BY MOUTH TWICE DAILY   metolazone (ZAROXOLYN) 2.5 MG tablet Take 1 tablet (2.5 mg total) by mouth 2 (two) times a week.   Multiple Vitamins-Minerals (PRESERVISION AREDS PO) Take 1 capsule by mouth 2 (two) times daily.   omeprazole (PRILOSEC OTC) 20 MG tablet Take 20 mg by mouth daily.   potassium chloride SA (KLOR-CON M) 20 MEQ tablet Take 2 tablets (40 mEq total) by mouth daily. (Patient taking differently: Take 20 mEq by mouth See admin instructions. 1 tablet twice daily, except on Mon and Fri take 3 tabs in the morning, 1 tab in the evening)   spironolactone (ALDACTONE) 25 MG tablet Take 1 tablet (25 mg total) by mouth daily.   tamsulosin (FLOMAX) 0.4 MG CAPS capsule Take 0.4 mg by mouth daily.   vitamin B-12 (CYANOCOBALAMIN) 1000 MCG tablet Take 1,000 mcg by mouth daily.   zolpidem (AMBIEN CR) 12.5 MG CR tablet Take 12.5 mg by mouth at bedtime as needed for sleep.     Allergies:   Ace inhibitors and Angiotensin receptor blockers   Social History   Socioeconomic History   Marital status: Married    Spouse name: Not on file   Number of children: Not on file   Years of education: Not on file   Highest education level: Not on file  Occupational  History   Occupation: real estate    Comment: investment  Tobacco Use   Smoking status: Former    Packs/day: 1.50    Years: 43.00    Total pack years: 64.50    Types: Cigarettes, Cigars    Quit date: 08/02/2012    Years since quitting: 10.1   Smokeless tobacco: Never  Vaping Use   Vaping Use: Never used  Substance and Sexual Activity   Alcohol use: Yes    Alcohol/week: 0.0 standard drinks of alcohol    Comment: once a week; previously 3-4 times per week    Drug use: No   Sexual activity: Yes  Other Topics Concern   Not on file  Social History Narrative   Works as a Personal assistant  developer for apartments   Lives with wife.  They have one grown daughter   Highest level of education:  college   Social Determinants of Health   Financial Resource Strain: Not on file  Food Insecurity: Not on file  Transportation Needs: Not on file  Physical Activity: Not on file  Stress: Not on file  Social Connections: Not on file     Family History: The patient's family history includes Atrial fibrillation in his mother; COPD in his father; Cerebral aneurysm in his brother; Healthy in his daughter.  ROS:   Please see the history of present illness.    (+) Shortness of breath (+) Fatigue All other systems reviewed and are negative.  EKGs/Labs/Other Studies Reviewed:    The following studies were reviewed today:  04/12/2022 Echo LV 50 RV normal LA mildly dilated  09/23/2022 in clinic device interrogation personally reviewed AF, VS 40 bpm presenting Battery longevity 6.8 years Lead parameters stable Decreased LV lead output to maximize battery longevity Graphs show decreased biV pacing since pers AF in September   EKG:  The ekg ordered today demonstrates AF with biV pacing.  Recent Labs: 08/06/2022: B Natriuretic Peptide 104.5; Hemoglobin 14.7; Platelets 246 09/21/2022: BUN 62; Creatinine, Ser 2.57; Potassium 4.3; Sodium 136   Recent Lipid Panel    Component Value Date/Time    CHOL 184 08/03/2013 0640   TRIG 149 08/03/2013 0640   HDL 50 08/03/2013 0640   CHOLHDL 3.7 08/03/2013 0640   VLDL 30 08/03/2013 0640   LDLCALC 104 (H) 08/03/2013 0640    Physical Exam:    VS:  BP (!) 108/58   Pulse 87   Ht '5\' 11"'$  (1.803 m)   Wt 300 lb (136.1 kg)   SpO2 98%   BMI 41.84 kg/m     Wt Readings from Last 3 Encounters:  09/23/22 300 lb (136.1 kg)  09/02/22 (!) 308 lb 9.6 oz (140 kg)  08/13/22 (!) 303 lb 2.1 oz (137.5 kg)     GEN: Well nourished, well developed in no acute distress. obese HEENT: Normal NECK: No JVD; No carotid bruits LYMPHATICS: No lymphadenopathy CARDIAC: Irregularly irregular, no murmurs, rubs, gallops RESPIRATORY:  Clear to auscultation without rales, wheezing or rhonchi  ABDOMEN: Soft, non-tender, non-distended MUSCULOSKELETAL:  No edema; No deformity  SKIN: Warm and dry NEUROLOGIC:  Alert and oriented x 3 PSYCHIATRIC:  Normal affect       ASSESSMENT:    1. Persistent atrial fibrillation ([place])   2. Chronic combined systolic and diastolic heart failure ([place])   3. Biventricular cardiac pacemaker in situ    PLAN:    In order of problems listed above:  #Persistent AF Previously on amio, then stopped around 2018. Recently restarted after failed DCCV in September. Rhythm control indicated given hx of HF. Continue eliquis.  Discussed treatment options today for their AF including antiarrhythmic drug therapy and ablation. Discussed risks, recovery and likelihood of success. Discussed potential need for repeat ablation procedures and antiarrhythmic drugs after an initial ablation. They wish to proceed with scheduling.  Risk, benefits, and alternatives to EP study and radiofrequency ablation for afib were also discussed in detail today. These risks include but are not limited to stroke, bleeding, vascular damage, tamponade, perforation, damage to the esophagus, lungs, and other structures, pulmonary vein stenosis, worsening renal  function, and death. The patient understands these risk and wishes to proceed.  We will therefore proceed with catheter ablation at the next available time.  Carto, ICE, anesthesia are  requested for the procedure.  Will also obtain CT PV protocol prior to the procedure to exclude LAA thrombus and further evaluate atrial anatomy.  To get him some immediate relief, I have scheduled him for a DCCV next week in the EP lab with Dr Curt Bears with a 360J defibrillator. He required higher energy shocks with compression in the past.  #CRT-P in situ Device functioning appropriately. Continue remote monitoring.  #Chronic Systolic HF NYHA class II-III. Warm and dry on exam. Follows w DB. Warm and volume down on exam, ICD metrics Change diuretic to '80mg'$  once a day alternating with twice daily Encouraged him to weigh himself daily    Medication Adjustments/Labs and Tests Ordered: Current medicines are reviewed at length with the patient today.  Concerns regarding medicines are outlined above.   No orders of the defined types were placed in this encounter.  No orders of the defined types were placed in this encounter.  I,Mathew Stumpf,acting as a Education administrator for Vickie Epley, MD.,have documented all relevant documentation on the behalf of Vickie Epley, MD,as directed by  Vickie Epley, MD while in the presence of Vickie Epley, MD.  I, Vickie Epley, MD, have reviewed all documentation for this visit. The documentation on 09/23/22 for the exam, diagnosis, procedures, and orders are all accurate and complete.   Signed, Lars Mage, MD, [facility], [facility] 09/23/2022 3:37 PM    Electrophysiology [facility]

## 2022-09-23 NOTE — Patient Instructions (Addendum)
Medication Instructions:  Hold Lasix for 24 hours. Then alternate lasix 80 mg daily then 80 mg two times a day.  *If you need a refill on your cardiac medications before your next appointment, please call your pharmacy*   Lab Work: CBC, BMP  If you have labs (blood work) drawn today and your tests are completely normal, you will receive your results only by: [place] (if you have MyChart) OR A paper copy in the mail If you have any lab test that is abnormal or we need to change your treatment, we will call you to review the results.   Testing/Procedures: Your physician has recommended that you have a Cardioversion (DCCV). Electrical Cardioversion uses a jolt of electricity to your heart either through paddles or wired patches attached to your chest. This is a controlled, usually prescheduled, procedure. Defibrillation is done under light anesthesia in the hospital, and you usually go home the day of the procedure. This is done to get your heart back into a normal rhythm. You are not awake for the procedure. Please see the instruction sheet given to you today.  Your physician has requested that you have a TEE. During a TEE, sound waves are used to create images of your heart. It provides your doctor with information about the size and shape of your heart and how well your heart's chambers and valves are working. In this test, a transducer is attached to the end of a flexible tube that's guided down your throat and into your esophagus (the tube leading from you mouth to your stomach) to get a more detailed image of your heart. You are not awake for the procedure. Please see the instruction sheet given to you today. For further information please visit [email or web address].  Your physician has recommended that you have an ablation. Catheter ablation is a medical procedure used to treat some cardiac arrhythmias (irregular heartbeats). During catheter ablation, a long, thin, flexible tube is put  into a blood vessel in your groin (upper thigh), or neck. This tube is called an ablation catheter. It is then guided to your heart through the blood vessel. Radio frequency waves destroy small areas of heart tissue where abnormal heartbeats may cause an arrhythmia to start. Please see the instruction sheet given to you today.   Follow-Up: At [facility], you and your health needs are our priority.  As part of our continuing mission to provide you with exceptional heart care, we have created designated Provider Care Teams.  These Care Teams include your primary Cardiologist (physician) and Advanced Practice Providers (APPs -  Physician Assistants and Nurse Practitioners) who all work together to provide you with the care you need, when you need it.  We recommend signing up for the patient portal called "MyChart".  Sign up information is provided on this After Visit Summary.  MyChart is used to connect with patients for Virtual Visits (Telemedicine).  Patients are able to view lab/test results, encounter notes, upcoming appointments, etc.  Non-urgent messages can be sent to your provider as well.   To learn more about what you can do with MyChart, go to [email or web address].    Your next appointment:   Ablation date picked is Feb 8, the morning of your ablation you will also have a TEE. Preop labs will be Jan 18. You will meet with EP staff that day for you TEE and Ablation instructions.     Dear [name]  You are scheduled for a Cardioversion on Tuesday,  November 7 with Dr. Curt Bears.  Please arrive at the [facility] (Main Entrance A) at [facility]: [address] [place], [place] [address] at 3:30 PM.   DIET:  Nothing to eat or drink after 8 am except a sip of water with medications (see medication instructions below)  MEDICATION INSTRUCTIONS: All morning medications okay with a sip of water.        :1}Continue taking your anticoagulant (blood thinner): Apixaban  (Eliquis).  You will need to continue this after your procedure until you are told by your provider that it is safe to stop.    LABS:   Today Come to the lab at [facility] at [address] between the hours of 8:00 am and 4:30 pm. You do NOT have to be fasting.  You must have a responsible person to drive you home and stay in the waiting area during your procedure. Failure to do so could result in cancellation.  Bring your insurance cards.  *Special Note: Every effort is made to have your procedure done on time. Occasionally there are emergencies that occur at the hospital that may cause delays. Please be patient if a delay does occur.       Important Information About Sugar

## 2022-09-24 LAB — BASIC METABOLIC PANEL
BUN/Creatinine Ratio: 24 (ref 10–24)
BUN: 64 mg/dL — ABNORMAL HIGH (ref 8–27)
CO2: 23 mmol/L (ref 20–29)
Calcium: 9.2 mg/dL (ref 8.6–10.2)
Chloride: 98 mmol/L (ref 96–106)
Creatinine, Ser: 2.65 mg/dL — ABNORMAL HIGH (ref 0.76–1.27)
Glucose: 109 mg/dL — ABNORMAL HIGH (ref 70–99)
Potassium: 5.3 mmol/L — ABNORMAL HIGH (ref 3.5–5.2)
Sodium: 138 mmol/L (ref 134–144)
eGFR: 25 mL/min/{1.73_m2} — ABNORMAL LOW (ref >59)

## 2022-09-24 LAB — CBC
Hematocrit: 45 % (ref 37.5–51.0)
Hemoglobin: 15 g/dL (ref 13.0–17.7)
MCH: 31.3 pg (ref 26.6–33.0)
MCHC: 33.3 g/dL (ref 31.5–35.7)
MCV: 94 fL (ref 79–97)
Platelets: 250 10*3/uL (ref 150–450)
RBC: 4.79 x10E6/uL (ref 4.14–5.80)
RDW: 13.2 % (ref 11.6–15.4)
WBC: 9.6 10*3/uL (ref 3.4–10.8)

## 2022-09-27 ENCOUNTER — Telehealth (HOSPITAL_COMMUNITY): Payer: Self-pay | Admitting: Cardiology

## 2022-09-27 NOTE — Telephone Encounter (Signed)
Patient called.  Patient aware, reports he was called by Dr Quentin Ore for pre procedure labs and the following labs were made: stop metolazone, torsemide 80 bid alternating with 80 daily, scheduled for repeat labs 11/7 with labs repeated

## 2022-09-27 NOTE — Telephone Encounter (Signed)
-----   Message from Rafael Bihari, [place] sent at 09/21/2022  4:46 PM EDT ----- Kidney function significantly worse.  Stop  metolazone.  Hold torsemide x 2 days, then resume at home dose of 80 bid.  Repeat BMET Friday or Monday.

## 2022-09-28 ENCOUNTER — Ambulatory Visit (HOSPITAL_BASED_OUTPATIENT_CLINIC_OR_DEPARTMENT_OTHER): Payer: 59 | Admitting: General Practice

## 2022-09-28 ENCOUNTER — Encounter (HOSPITAL_COMMUNITY)
Admission: RE | Disposition: A | Discharge status: Home / Self Care | Payer: Self-pay | Source: Home / Self Care | Attending: Cardiology

## 2022-09-28 ENCOUNTER — Ambulatory Visit (HOSPITAL_COMMUNITY): Payer: 59 | Admitting: General Practice

## 2022-09-28 ENCOUNTER — Ambulatory Visit (HOSPITAL_COMMUNITY)
Admission: RE | Admit: 2022-09-28 | Discharge: 2022-09-28 | Disposition: A | Discharge status: Home / Self Care | Payer: 59 | Attending: Cardiology | Admitting: Cardiology

## 2022-09-28 ENCOUNTER — Other Ambulatory Visit: Payer: Self-pay

## 2022-09-28 ENCOUNTER — Encounter (HOSPITAL_COMMUNITY): Payer: Self-pay | Admitting: Cardiology

## 2022-09-28 DIAGNOSIS — Z87891 Personal history of nicotine dependence: Secondary | ICD-10-CM

## 2022-09-28 DIAGNOSIS — Z950 Presence of cardiac pacemaker: Secondary | ICD-10-CM | POA: Insufficient documentation

## 2022-09-28 DIAGNOSIS — G4733 Obstructive sleep apnea (adult) (pediatric): Secondary | ICD-10-CM | POA: Insufficient documentation

## 2022-09-28 DIAGNOSIS — I5042 Chronic combined systolic (congestive) and diastolic (congestive) heart failure: Secondary | ICD-10-CM | POA: Insufficient documentation

## 2022-09-28 DIAGNOSIS — I4819 Other persistent atrial fibrillation: Secondary | ICD-10-CM | POA: Diagnosis present

## 2022-09-28 DIAGNOSIS — Z6841 Body Mass Index (BMI) 40.0 and over, adult: Secondary | ICD-10-CM | POA: Insufficient documentation

## 2022-09-28 DIAGNOSIS — Z79899 Other long term (current) drug therapy: Secondary | ICD-10-CM | POA: Insufficient documentation

## 2022-09-28 DIAGNOSIS — J449 Chronic obstructive pulmonary disease, unspecified: Secondary | ICD-10-CM | POA: Diagnosis not present

## 2022-09-28 DIAGNOSIS — Z7901 Long term (current) use of anticoagulants: Secondary | ICD-10-CM | POA: Diagnosis not present

## 2022-09-28 DIAGNOSIS — I110 Hypertensive heart disease with heart failure: Secondary | ICD-10-CM

## 2022-09-28 DIAGNOSIS — I4891 Unspecified atrial fibrillation: Secondary | ICD-10-CM | POA: Diagnosis not present

## 2022-09-28 DIAGNOSIS — I509 Heart failure, unspecified: Secondary | ICD-10-CM | POA: Diagnosis not present

## 2022-09-28 DIAGNOSIS — Z01818 Encounter for other preprocedural examination: Secondary | ICD-10-CM

## 2022-09-28 HISTORY — PX: CARDIOVERSION: EP1203

## 2022-09-28 LAB — BASIC METABOLIC PANEL
Anion gap: 12 (ref 5–15)
BUN: 51 mg/dL — ABNORMAL HIGH (ref 8–23)
CO2: 21 mmol/L — ABNORMAL LOW (ref 22–32)
Calcium: 8.9 mg/dL (ref 8.9–10.3)
Chloride: 102 mmol/L (ref 98–111)
Creatinine, Ser: 2.19 mg/dL — ABNORMAL HIGH (ref 0.61–1.24)
GFR, Estimated: 32 mL/min — ABNORMAL LOW (ref >60)
Glucose, Bld: 107 mg/dL — ABNORMAL HIGH (ref 70–99)
Potassium: 4.2 mmol/L (ref 3.5–5.1)
Sodium: 135 mmol/L (ref 135–145)

## 2022-09-28 SURGERY — CARDIOVERSION (CATH LAB)
Anesthesia: General

## 2022-09-28 MED ORDER — LIDOCAINE 2% (20 MG/ML) 5 ML SYRINGE
INTRAMUSCULAR | Status: DC | PRN
  Administered 2022-09-28: 100 mg via INTRAVENOUS

## 2022-09-28 MED ORDER — SODIUM CHLORIDE 0.9 % IV SOLN: INTRAVENOUS | Status: DC

## 2022-09-28 MED ORDER — PROPOFOL 10 MG/ML IV BOLUS
INTRAVENOUS | Status: DC | PRN
  Administered 2022-09-28: 20 mg via INTRAVENOUS
  Administered 2022-09-28: 80 mg via INTRAVENOUS
  Administered 2022-09-28: 40 mg via INTRAVENOUS

## 2022-09-28 SURGICAL SUPPLY — 1 items: PAD DEFIB RADIO PHYSIO CONN (PAD) IMPLANT

## 2022-09-28 NOTE — Anesthesia Postprocedure Evaluation (Signed)
Anesthesia Post Note  Patient: Nicholas Foley  Procedure(s) Performed: CARDIOVERSION     Patient location during evaluation: Cath Lab Anesthesia Type: General Level of consciousness: awake Pain management: pain level controlled Vital Signs Assessment: post-procedure vital signs reviewed and stable Respiratory status: spontaneous breathing Cardiovascular status: stable Postop Assessment: no apparent nausea or vomiting Anesthetic complications: no   There were no known notable events for this encounter.  Last Vitals:  Vitals:   09/28/22 1500  BP: (!) 97/52  Pulse: 64  Resp: 18  Temp: 36.6 C  SpO2: 96%    Last Pain:  Vitals:   09/28/22 1511  TempSrc:   PainSc: 0-No pain                 Rael Yo

## 2022-09-28 NOTE — Interval H&P Note (Signed)
History and Physical Interval Note:  09/28/2022 3:27 PM  Nicholas Foley  has presented today for surgery, with the diagnosis of afib.  The various methods of treatment have been discussed with the patient and family. After consideration of risks, benefits and other options for treatment, the patient has consented to  Procedure(s): CARDIOVERSION (N/A) as a surgical intervention.  The patient's history has been reviewed, patient examined, no change in status, stable for surgery.  I have reviewed the patient's chart and labs.  Questions were answered to the patient's satisfaction.     Nicholas Foley [employer]

## 2022-09-28 NOTE — Transfer of Care (Signed)
Immediate Anesthesia Transfer of Care Note  Patient: [name]  Procedure(s) Performed: CARDIOVERSION  Patient Location: Cath Lab  Anesthesia Type:General  Level of Consciousness: awake, alert , and oriented  Airway & Oxygen Therapy: Patient Spontanous Breathing  Post-op Assessment: Report given to RN, Post -op Vital signs reviewed and stable, Patient moving all extremities X 4, and Patient able to stick tongue midline  Post vital signs: Reviewed  Last Vitals:  Vitals Value Taken Time  BP 112/68 09/28/22 16:55  Temp 97.6 09/28/22 16:55  Pulse 76 09/28/22 16;55  Resp 19 09/28/22 16:55  SpO2 94 % 09/28/22 16:55  Vitals shown include unvalidated device data.  Last Pain:  Vitals:   09/28/22 1511  TempSrc:   PainSc: 0-No pain         Complications: There were no known notable events for this encounter.

## 2022-09-28 NOTE — Anesthesia Preprocedure Evaluation (Addendum)
Anesthesia Evaluation  Patient identified by MRN, date of birth, ID band Patient awake    Reviewed: Allergy & Precautions, H&P , NPO status , Patient's Chart, lab work & pertinent test results  History of Anesthesia Complications Negative for: history of anesthetic complications  Airway Mallampati: II  TM Distance: >3 FB Neck ROM: full    Dental   Pulmonary sleep apnea , COPD, former smoker   Pulmonary exam normal        Cardiovascular hypertension, +CHF  + dysrhythmias Atrial Fibrillation  Rhythm:irregular  Echo 05/23: EF 50-55%, no RWMA, mod LVH, g1dd, normal RVSF, no significant valve dz    Neuro/Psych negative neurological ROS  negative psych ROS   GI/Hepatic negative GI ROS, Neg liver ROS,,,  Endo/Other    Morbid obesity  Renal/GU negative Renal ROS  negative genitourinary   Musculoskeletal   Abdominal   Peds  Hematology   Anesthesia Other Findings   Reproductive/Obstetrics                             Anesthesia Physical Anesthesia Plan  ASA: 3  Anesthesia Plan: General   Post-op Pain Management:    Induction:   PONV Risk Score and Plan: Treatment may vary due to age or medical condition and Propofol infusion  Airway Management Planned:   Additional Equipment:   Intra-op Plan:   Post-operative Plan:   Informed Consent: I have reviewed the patients History and Physical, chart, labs and discussed the procedure including the risks, benefits and alternatives for the proposed anesthesia with the patient or authorized representative who has indicated his/her understanding and acceptance.     Dental advisory given  Plan Discussed with: Anesthesiologist and CRNA  Anesthesia Plan Comments:         Anesthesia Quick Evaluation

## 2022-09-29 ENCOUNTER — Encounter (HOSPITAL_COMMUNITY): Payer: Self-pay | Admitting: Cardiology

## 2022-09-29 ENCOUNTER — Encounter: Payer: Self-pay | Admitting: Cardiology

## 2022-10-04 ENCOUNTER — Ambulatory Visit (INDEPENDENT_AMBULATORY_CARE_PROVIDER_SITE_OTHER): Payer: 59

## 2022-10-04 DIAGNOSIS — Z950 Presence of cardiac pacemaker: Secondary | ICD-10-CM

## 2022-10-04 DIAGNOSIS — I5042 Chronic combined systolic (congestive) and diastolic (congestive) heart failure: Secondary | ICD-10-CM

## 2022-10-04 NOTE — Progress Notes (Unsigned)
EPIC Encounter for ICM Monitoring  Patient Name: Nicholas Foley is a 71 y.o. male Date: 10/04/2022 Primary Care Physican: [name], MD Primary Cardiologist: Smith/Bensimhon Electrophysiologist: Quentin Ore Nephrologist: [facility]: Dr Joylene Grapes Bi-V Pacing:  96.5% 03/17/2022 Weight: 300 lbs  10/04/2022 Weight: 299 lbs (299-302 baseline)           Spoke with patient and heart failure questions reviewed.  He reports cardioversion was successful on 11/7.  He was referred to nephrologist due for follow up with change in Creatinine.  Pt reports Metolazone was discontinued and Furosemide dosage was decreased on 11/7.  He reports feeling SOB and feeling bad today.  Harder to get a deep breath and feels chest is more consticted.  He reports the nephrologist are keeping very close eye on kidney function and hoping for improvement.         Optivol thoracic impedance suggesting possible fluid accumulation starting 11/6.  Appears to be in NSR since cardioversion.    Prescribed: Furosemide 40 mg take 2 tablets (80 mg total) twice a day every other day alternating with 80 mg once a day every other day. Potassium 20 mEq take 1 tablet twice a day on same days taking Furosemide twice a day alternating with 1 tablet daily on same days taking Furosemide once a day   Labs: 09/28/2022 Creatinine 2.19, BUN 51, Potassium 4.2, Sodium 135, GFR 32 09/23/2022 Creatinine 2.65, BUN 64, Potassium 5.3, Sodium 138, GFR 25  09/21/2022 Creatinine 2.57, BUN 62, Potassium 4.3, Sodium 136, GFR 26  09/02/2022 Creatinine 1.53, BUN 18, Potassium 4.1, Sodium 139, GFR 49 08/09/2022 Creatinine 1.68, BUN 33, Potassium 4.2, Sodium 139, GFR 44  08/06/2022 Creatinine 1.51, BUN 18, Potassium 3.1, Sodium 139, GFR 50  07/15/2021 Creatinine 1.08, BUN 17, Potassium 3.7, Sodium 138. GFR >60 05/04/2021 Creatinine 1.25, BUN 20, Potassium 4.4, Sodium 136, GFR 63 01/07/2021 Creatinine 1.20, BUN 17, Potassium 3.7, Sodium 139, GFR  >60 A complete set of results can be found in Results Review.   Recommendations:   Sent to Dr Haroldine Laws and Dr Quentin Ore for review and recommendations.  He spoke with nurse at nephrologist office this afternoon but has not received any recommendations from Dr Joylene Grapes yet.    Follow-up plan: ICM clinic phone appointment on 10/11/2022 31 day and to recheck fluid levels.   91 day device clinic remote transmission 10/20/2022.     EP/Cardiology Office Visits:   Recall 05/29/2023 with Dr Quentin Ore.  11/18/2022 with Dr Haroldine Laws.  Recall 03/14/2023 with Dr [name].   Copy of ICM check sent to Dr Quentin Ore.   3 month ICM trend: 10/04/2022.    12-14 Month ICM trend:     Rosalene Billings, RN 10/04/2022 3:41 PM

## 2022-10-05 ENCOUNTER — Encounter: Payer: 59 | Admitting: Cardiology

## 2022-10-05 ENCOUNTER — Telehealth: Payer: Self-pay

## 2022-10-05 MED ORDER — FUROSEMIDE 80 MG PO TABS: 80.0000 mg | ORAL_TABLET | Freq: Two times a day (BID) | ORAL | 3 refills | Status: DC

## 2022-10-05 NOTE — Progress Notes (Signed)
Attempted call to patient and unable to reach.    

## 2022-10-05 NOTE — Progress Notes (Signed)
  Received: Today Vickie Epley, MD  Jamyia Fortune Panda, RN; Darrell Jewel, RN I'd have him go back to twice daily lasix for now.  Otila Kluver, can you see if one of the HF APPs or Andy/Renee can see him in the next 1-2 weeks?  Thanks!     Received: Today Vickie Epley, MD  Bailley Guilford Panda, RN; Haroldine Laws, Shaune Pascal, MD Just responded to the other message. Increase lasix to BID. Requested f/u with HF or EP APP. Thanks, CL

## 2022-10-05 NOTE — Telephone Encounter (Signed)
Attempted ICM Call to patient. Left message to return call.  See ICM Note

## 2022-10-05 NOTE — Progress Notes (Signed)
Spoke with patient and he informed nephrologist office he will be resuming Furosemide 80 mg bid as instructed by Dr Quentin Ore.  The office said Dr Joylene Grapes will contact Dr Haroldine Laws and Dr Quentin Ore to discuss the best course of action.  He will resume Lasix 80 mg twice a day today.   Message to Dr Bensimhon's office to request APP appointment within the next couple of weeks.  Pt agreeable to plan.  Advised HF clinic will call for appointment and if no appointments available will check EP app has any appointments in the next 2 weeks.  He does not need a Furosemide refill at this time and will call when needed.

## 2022-10-05 NOTE — Progress Notes (Signed)
Spoke with patient and provided Dr Claudie Revering recommendations to take Lasix 80 bid and get appt with HF clinic or EP APP in the next couple of weeks.  He was agreeable.  He reports nephrologist office has left him a message to call back and will do so after this call completed.  He will call back to inform of their recommendations as well.

## 2022-10-05 NOTE — Progress Notes (Signed)
Attempted call to patient and unable to reach.  Left message to return call.

## 2022-10-11 ENCOUNTER — Ambulatory Visit: Payer: 59

## 2022-10-11 ENCOUNTER — Ambulatory Visit
Admission: RE | Admit: 2022-10-11 | Discharge: 2022-10-11 | Disposition: A | Discharge status: Home / Self Care | Payer: 59 | Source: Ambulatory Visit | Attending: Cardiology | Admitting: Cardiology

## 2022-10-11 ENCOUNTER — Encounter (HOSPITAL_COMMUNITY): Payer: Self-pay

## 2022-10-11 ENCOUNTER — Encounter: Payer: 59 | Admitting: *Deleted

## 2022-10-11 ENCOUNTER — Encounter (INDEPENDENT_AMBULATORY_CARE_PROVIDER_SITE_OTHER): Payer: Medicare Other | Admitting: Ophthalmology

## 2022-10-11 VITALS — BP 110/70 | HR 74 | Wt 302.0 lb

## 2022-10-11 DIAGNOSIS — J449 Chronic obstructive pulmonary disease, unspecified: Secondary | ICD-10-CM | POA: Insufficient documentation

## 2022-10-11 DIAGNOSIS — N1832 Chronic kidney disease, stage 3b: Secondary | ICD-10-CM | POA: Diagnosis not present

## 2022-10-11 DIAGNOSIS — I480 Paroxysmal atrial fibrillation: Secondary | ICD-10-CM | POA: Diagnosis not present

## 2022-10-11 DIAGNOSIS — I447 Left bundle-branch block, unspecified: Secondary | ICD-10-CM | POA: Diagnosis not present

## 2022-10-11 DIAGNOSIS — I428 Other cardiomyopathies: Secondary | ICD-10-CM | POA: Diagnosis not present

## 2022-10-11 DIAGNOSIS — G4733 Obstructive sleep apnea (adult) (pediatric): Secondary | ICD-10-CM | POA: Diagnosis not present

## 2022-10-11 DIAGNOSIS — Z6841 Body Mass Index (BMI) 40.0 and over, adult: Secondary | ICD-10-CM | POA: Insufficient documentation

## 2022-10-11 DIAGNOSIS — Z7901 Long term (current) use of anticoagulants: Secondary | ICD-10-CM | POA: Diagnosis not present

## 2022-10-11 DIAGNOSIS — Z87891 Personal history of nicotine dependence: Secondary | ICD-10-CM | POA: Diagnosis not present

## 2022-10-11 DIAGNOSIS — Z79899 Other long term (current) drug therapy: Secondary | ICD-10-CM | POA: Insufficient documentation

## 2022-10-11 DIAGNOSIS — I4819 Other persistent atrial fibrillation: Secondary | ICD-10-CM

## 2022-10-11 DIAGNOSIS — I130 Hypertensive heart and chronic kidney disease with heart failure and stage 1 through stage 4 chronic kidney disease, or unspecified chronic kidney disease: Secondary | ICD-10-CM | POA: Diagnosis present

## 2022-10-11 DIAGNOSIS — I5042 Chronic combined systolic (congestive) and diastolic (congestive) heart failure: Secondary | ICD-10-CM | POA: Diagnosis not present

## 2022-10-11 DIAGNOSIS — Z006 Encounter for examination for normal comparison and control in clinical research program: Secondary | ICD-10-CM

## 2022-10-11 MED ORDER — METOLAZONE 2.5 MG PO TABS: 2.5000 mg | ORAL_TABLET | Freq: Once | ORAL | 0 refills | Status: DC

## 2022-10-11 NOTE — Patient Instructions (Addendum)
Thank you for coming in today  Your physician recommends that you return for lab work in:  1 week   Take Metolazone 2.5 mg 1 tablet with extra 40 meq of Potassium today  Your physician recommends that you schedule a follow-up appointment in:  Keep follow up with Dr. Haroldine Laws in December    Do the following things EVERYDAY: Weigh yourself in the morning before breakfast. Write it down and keep it in a log. Take your medicines as prescribed Eat low salt foods--Limit salt (sodium) to 2000 mg per day.  Stay as active as you can everyday Limit all fluids for the day to less than 2 liters  At the [facility], you and your health needs are our priority. As part of our continuing mission to provide you with exceptional heart care, we have created designated Provider Care Teams. These Care Teams include your primary Cardiologist (physician) and Advanced Practice Providers (APPs- Physician Assistants and Nurse Practitioners) who all work together to provide you with the care you need, when you need it.   You may see any of the following providers on your designated Care Team at your next follow up: Dr Glori Bickers Dr Loralie Champagne Dr. Roxana Hires, NP Lyda Jester, [place] [facility] [place], [place] Forestine Na, NP Audry Riles, PharmD   Please be sure to bring in all your medications bottles to every appointment.   If you have any questions or concerns before your next appointment please send [place] a message through [place] or call our office at [phone].    TO LEAVE A MESSAGE FOR THE NURSE SELECT OPTION 2, PLEASE LEAVE A MESSAGE INCLUDING: YOUR NAME DATE OF BIRTH CALL BACK NUMBER REASON FOR CALL**this is important as we prioritize the call backs  YOU WILL RECEIVE A CALL BACK THE SAME DAY AS LONG AS YOU CALL BEFORE 4:00 PM

## 2022-10-11 NOTE — Research (Signed)
ANALOG  Informed Consent   Subject Name: Nicholas Foley  Subject met inclusion and exclusion criteria.  The informed consent form, study requirements and expectations were reviewed with the subject and questions and concerns were addressed prior to the signing of the consent form.  The subject verbalized understanding of the trial requirements.  The subject agreed to participate in the ANALOG  trial and signed the informed consent at 10:22 on 10-11-2022.  The informed consent was obtained prior to performance of any protocol-specific procedures for the subject.  A copy of the signed informed consent was given to the subject and a copy was placed in the subject's medical record.   [place] Khiara Shuping, Research Coordinator  10/11/2022  10:22

## 2022-10-11 NOTE — Progress Notes (Signed)
Advanced Heart Failure Clinic Note   Date:  10/11/2022   ID:  [name], DOB August 20, 1951, MRN [id number]  Location: Home  Provider location: [place] [facility] Type of Visit: Established patient  PCP:  [name], MD  Primary Cardiologist:  [name], MD Nephrology: Dr. Joylene Grapes HF Cardiologist: Dr. Haroldine Laws  Chief Complaint: Heart Failure follow-up    HPI:  [name] is a 71 y.o. with morbid obesity, chronic systolic heart failure, COPD, OSA, PAF, previously on amio-> stopped 2018,  HTN, LBBB, Medtronic CRT-P, and former smoker.   He has h/o NICM which seems to date back to 2014. Had cardiac cath in 1/20 with no CAD and well compensated filling pressures.    Admitted in 1/20 for CRT-P device in setting of LBBB. Intially he felt a lot better but has gradually declined.   Echo 01/30/20 EF 40-45% (read as 35-40%)  Echo EF 2/22 45% (in some images 45-50%) Limited by septal paradox.   Got COVID again in 1/22 but was a mild case.   Had recurrent AF. Underwent DC-CV 8/22.   Follow up 5/23, NYHA I-II, fluid stable. Echo 04/12/22 showed EF 50-55%, LV dyysynchrony, grade I DD,  RV ok.   DCCV 9/23 unsuccessful x 3. Amio started at AF Clinic follow up, re-attempted DCCV 09/03/22, but unsuccessful. Saw Dr. Quentin Ore 09/23/22, planning for AF ablation. Re-attempt at [place] with higher joules arranged in the meantime. S/p DCCV 09/28/22 successful conversion to NSR.  Started accumulating fluid 09/27/22, advised to increase lasix back to 80 mg bid and follow up with AHF.   Today he returns for acute visit with complaints of fluid overload. Overall feeling fair. Feels better since being back in NSR, but has new orthopnea and feels he has fluid on him.  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 Denies CP, abnormal  bleeding, dizziness, edema, or PND. Appetite ok. No fever or chills. Weight at home 299 pounds. Taking all medications. Back on Lasix 80 bid  x 1 week now. Worried about his kidneys, now followed by Dr. Osborne Casco. Wears CPAP.  Cardiac Studies  - Echo (5/23): EF 50-55%, severe LV dyysynchrony, grade I DD< RV ok,   - Echo (2/22): EF45%  - Echo (3/21): EF 40-45% (read as 35-40%).  - CPX (09/18/19):  Limited by obesity and mild heart failure Peak VO2 15.1  (80% predicted peak VO2) - when corrected to ibw pVO2 27.5 Slope 33 RER 1.02    - L/RHC (1/20) No coronary disease.  RA 13 PA 37/20 (27)  PCWP 14 CO 8.5 CI 3.3    - Echo (11/20): RV normal EF 45-50%   - Echo (11/19): EF 35-40% Grade II DD      Past Medical History:  Diagnosis Date   Atrial fibrillation with rapid ventricular response ([place]) 08/02/2013   CHF (congestive heart failure) (HCC)    Chronic systolic dysfunction of left ventricle 08/02/2013   BNP greater than 2000    COPD (chronic obstructive pulmonary disease) (HCC)    Moderate by PFTs with response to bronchodilators, May 2014   Erectile dysfunction    Hypertension    Insomnia 08/02/13   Left bundle branch block 08/02/2013   Morbid obesity ([place]) 08/02/2013   OSA (obstructive sleep apnea)    severe OSA with AHI 74/hr now on CPAP at 8cm H2O   Past Surgical History:  Procedure Laterality Date   BIV PACEMAKER INSERTION CRT-P N/A 12/14/2018  Medtronic model [id number] Percepta Quad CRT-P MRI Rolan Bucco (serial Number [id number]) device implanted by Dr [name] for CHF and LBBB   CARDIOVERSION N/A 08/06/2013   Procedure: CARDIOVERSION;  Surgeon: Lelon Perla, MD;  Location: [place];  Service: Cardiovascular;  Laterality: N/A;  spoke with [name]    CARDIOVERSION N/A 09/03/2013   Procedure: CARDIOVERSION;  Surgeon: [name], MD;  Location: [place];  Service: Cardiovascular;  Laterality: N/A;   CARDIOVERSION N/A 07/21/2021   Procedure: CARDIOVERSION;  Surgeon: Larey Dresser, MD;  Location: [facility] ENDOSCOPY;  Service: Cardiovascular;  Laterality: N/A;   CARDIOVERSION N/A 08/13/2022   Procedure:  CARDIOVERSION;  Surgeon: Josue Hector, MD;  Location[facility] ENDOSCOPY;  Service: Cardiovascular;  Laterality: N/A;   CARDIOVERSION N/A 09/03/2022   Procedure: CARDIOVERSION;  Surgeon: Jolaine Artist, MD;  Location: [facility] ENDOSCOPY;  Service: Cardiovascular;  Laterality: N/A;   CARDIOVERSION N/A 09/28/2022   Procedure: CARDIOVERSION;  Surgeon: Constance Haw, MD;  Location: [place] CV LAB;  Service: Cardiovascular;  Laterality: N/A;   chronic systolic dysfu     NASAL SEPTUM SURGERY     RIGHT/LEFT HEART CATH AND CORONARY ANGIOGRAPHY N/A 12/04/2018   Procedure: RIGHT/LEFT HEART CATH AND CORONARY ANGIOGRAPHY;  Surgeon: Belva Crome, MD;  Location: [place] CV LAB;  Service: Cardiovascular;  Laterality: N/A;   TEE WITHOUT CARDIOVERSION N/A 08/06/2013   Procedure: TRANSESOPHAGEAL ECHOCARDIOGRAM (TEE);  Surgeon: Lelon Perla, MD;  Location: [facility] ENDOSCOPY;  Service: Cardiovascular;  Laterality: N/A;   VASECTOMY     Current Outpatient Medications  Medication Sig Dispense Refill   acetaminophen (TYLENOL) 650 MG CR tablet Take 1,300 mg by mouth in the morning and at bedtime. Arthritis strength     ADVAIR DISKUS 250-50 MCG/DOSE AEPB Inhale 1 puff into the lungs 2 (two) times daily.     amiodarone (PACERONE) 200 MG tablet Take 200 mg by mouth daily.     carvedilol (COREG) 25 MG tablet Take 1 tablet (25 mg total) by mouth 2 (two) times daily with a meal. 180 tablet 3   cholecalciferol (VITAMIN D3) 25 MCG (1000 UT) tablet Take 1,000 Units by mouth daily.     CIALIS 20 MG tablet Take 20 mg by mouth daily as needed for erectile dysfunction.   0   cyclobenzaprine (FLEXERIL) 10 MG tablet Take 10 mg by mouth 3 (three) times daily as needed for muscle spasms.     ELIQUIS 5 MG TABS tablet TAKE 1 TABLET(5 MG) BY MOUTH TWICE DAILY 180 tablet 2   empagliflozin (JARDIANCE) 10 MG TABS tablet Take 1 tablet (10 mg total) by mouth daily before breakfast. 90 tablet 3   ENTRESTO 97-103 MG TAKE 1 TABLET BY  MOUTH TWICE DAILY 180 tablet 3   fluticasone (FLONASE) 50 MCG/ACT nasal spray Place 1 spray into both nostrils 2 (two) times daily.   0   furosemide (LASIX) 80 MG tablet Take 1 tablet (80 mg total) by mouth 2 (two) times daily. 360 tablet 3   Multiple Vitamins-Minerals (PRESERVISION AREDS PO) Take 1 capsule by mouth 2 (two) times daily.     omeprazole (PRILOSEC OTC) 20 MG tablet Take 20 mg by mouth daily.     Potassium Chloride (KLOR-CON PO) Take 20 mg by mouth 2 (two) times daily.     spironolactone (ALDACTONE) 25 MG tablet Take 1 tablet (25 mg total) by mouth daily. 90 tablet 1   tamsulosin (FLOMAX) 0.4 MG CAPS capsule Take 0.4 mg by mouth daily.  vitamin B-12 (CYANOCOBALAMIN) 1000 MCG tablet Take 1,000 mcg by mouth daily.     zolpidem (AMBIEN CR) 12.5 MG CR tablet Take 12.5 mg by mouth at bedtime as needed for sleep.     No current facility-administered medications for this encounter.    Allergies:   Ace inhibitors and Angiotensin receptor blockers   Social History:  The patient  reports that he quit smoking about 10 years ago. His smoking use included cigarettes and cigars. He has a 64.50 pack-year smoking history. He has never used smokeless tobacco. He reports current alcohol use. He reports that he does not use drugs.   Family History:  The patient's family history includes Atrial fibrillation in his mother; COPD in his father; Cerebral aneurysm in his brother; Healthy in his daughter.   ROS:  Please see the history of present illness.   All other systems are personally reviewed and negative.   Recent Labs: 08/06/2022: B Natriuretic Peptide 104.5 09/23/2022: Hemoglobin 15.0; Platelets 250 09/28/2022: BUN 51; Creatinine, Ser 2.19; Potassium 4.2; Sodium 135  Personally reviewed   Wt Readings from Last 3 Encounters:  10/11/22 (!) 137 kg (302 lb)  09/28/22 135.6 kg (299 lb)  09/23/22 136.1 kg (300 lb)    BP 110/70   Pulse 74   Wt (!) 137 kg (302 lb)   SpO2 94%   BMI 42.12  kg/m   Physical Exam General:  NAD. No resp difficulty, walked into clinic HEENT: Normal Neck: Supple. No JVD, thick neck. Carotids 2+ bilat; no bruits. No lymphadenopathy or thryomegaly appreciated. Cor: PMI nondisplaced. Regular rate & rhythm. No rubs, gallops or murmurs. Lungs: Clear Abdomen: Obese, soft, nontender, nondistended. No hepatosplenomegaly. No bruits or masses. Good bowel sounds. Extremities: No cyanosis, clubbing, rash, edema Neuro: Alert & oriented x 3, cranial nerves grossly intact. Moves all 4 extremities w/o difficulty. Affect pleasant.  Device interrogation (personally reviewed): OptiVol up, thoracic impedence down, no further AF since most recent DC-CV, 2 hrs day/activity.  ASSESSMENT AND PLAN: Chronic Combined Systolic/Diastolic HF - NICM (thought due to LBBB). 12/2018 [facility] no coronary disease.  - s/p Medtronic CRT-P - Echo (1/19) EF 35-40%. ECHO 11/19 EF 45-50%.  - Echo (11/20): EF read as 35-40% but with Definity I felt closed to 45% - Echo (3/21): EF read as 35-40% I think 40-45% - Echo EF (2/22): EF 45% (in some images 45-50%) Limited by septal paradox. - CPX test (10/20) suboptimal effort mostly limited to obesity  - Echo (5/23) EF 50-55%, LV dyysynchrony, grade I DD, RV ok.  - NYHA II-early III, volume OK on exam but up on device interrogation. - Continue Lasix 80 mg bid. Add metolazone 2.5 mg + extra 40 KCL today. (Will likely need a dose every 10-14 days, but will wait for labs to help guide dosing). - Continue KCL 20 bid. - Continue carvedilol 25 mg bid. - Continue Entresto 97-103 mg bid. - Continue spiro 25 mg daily. - Continue Jadiance 10 mg daily. - Recent labs ok K 4.2, SCr 2.49, repeat BMET and BNP in 1 week. - I will ask Sharman Cheek to send device interrogation in 1 week.   2. PAF  - In the past he was on amiodarone. Stopped ~2017 - Had recurrent AF. Underwent DC-CV 8/22. Followed by AF Clinic.  - Failed DCCV x 3 (9/23) and x 1 (10/23). -  CHA2DS2Vasc is 3. - Continue Eliquis 5 bid. No bleeding.  - Continue amiodarone 200 mg daily. - s/p successful DC-CV 11/23  with higher joules. - Remains in SR per device interrogation. - AF ablation scheduled for 2/24.  3. OSA - Continue CPAP.   4. Obesity -  Body mass index is 42.12 kg/m.  -  Continue weight loss efforts. Recently lost 40 pounds -  Insurance denied [employer].  5. COPD  - No longer smoking.  6. CKD 3b - Last SCr 2.19 - Continue SGLT2i. - Followed by Dr. Osborne Casco at [facility] renal function with metolazone.  - Check BMET in 1 week.  Keep follow up next month with Dr. Haroldine Laws, as scheduled.  [place], [place]  10/11/2022 9:52 AM  Advanced [facility] Failure [place] [address] Heart and [place] [address] [phone] (office) [phone] (fax)

## 2022-10-13 ENCOUNTER — Other Ambulatory Visit (HOSPITAL_COMMUNITY): Payer: Self-pay | Admitting: Cardiology

## 2022-10-18 ENCOUNTER — Ambulatory Visit (INDEPENDENT_AMBULATORY_CARE_PROVIDER_SITE_OTHER): Payer: 59

## 2022-10-18 DIAGNOSIS — Z950 Presence of cardiac pacemaker: Secondary | ICD-10-CM | POA: Diagnosis not present

## 2022-10-18 DIAGNOSIS — I5042 Chronic combined systolic (congestive) and diastolic (congestive) heart failure: Secondary | ICD-10-CM

## 2022-10-19 ENCOUNTER — Ambulatory Visit (HOSPITAL_COMMUNITY)
Admission: RE | Admit: 2022-10-19 | Discharge: 2022-10-19 | Disposition: A | Discharge status: Home / Self Care | Payer: 59 | Source: Ambulatory Visit | Attending: Cardiology | Admitting: Cardiology

## 2022-10-19 ENCOUNTER — Encounter: Payer: Self-pay | Admitting: *Deleted

## 2022-10-19 DIAGNOSIS — I5042 Chronic combined systolic (congestive) and diastolic (congestive) heart failure: Secondary | ICD-10-CM | POA: Insufficient documentation

## 2022-10-19 DIAGNOSIS — Z006 Encounter for examination for normal comparison and control in clinical research program: Secondary | ICD-10-CM

## 2022-10-19 LAB — BASIC METABOLIC PANEL
Anion gap: 11 (ref 5–15)
BUN: 43 mg/dL — ABNORMAL HIGH (ref 8–23)
CO2: 23 mmol/L (ref 22–32)
Calcium: 8.9 mg/dL (ref 8.9–10.3)
Chloride: 104 mmol/L (ref 98–111)
Creatinine, Ser: 2.2 mg/dL — ABNORMAL HIGH (ref 0.61–1.24)
GFR, Estimated: 32 mL/min — ABNORMAL LOW (ref >60)
Glucose, Bld: 106 mg/dL — ABNORMAL HIGH (ref 70–99)
Potassium: 4.5 mmol/L (ref 3.5–5.1)
Sodium: 138 mmol/L (ref 135–145)

## 2022-10-19 LAB — BRAIN NATRIURETIC PEPTIDE: B Natriuretic Peptide: 42.6 pg/mL (ref 0.0–100.0)

## 2022-10-20 ENCOUNTER — Ambulatory Visit (INDEPENDENT_AMBULATORY_CARE_PROVIDER_SITE_OTHER): Payer: 59

## 2022-10-20 DIAGNOSIS — I480 Paroxysmal atrial fibrillation: Secondary | ICD-10-CM

## 2022-10-20 LAB — CUP PACEART REMOTE DEVICE CHECK
Battery Remaining Longevity: 56 mo
Battery Voltage: 2.96 V
Brady Statistic AP VP Percent: 0.05 %
Brady Statistic AP VS Percent: 0.01 %
Brady Statistic AS VP Percent: 98.24 %
Brady Statistic AS VS Percent: 1.69 %
Brady Statistic RA Percent Paced: 0.06 %
Brady Statistic RV Percent Paced: 88.42 %
Date Time Interrogation Session: 20231128225108
Implantable Lead Connection Status: 753985
Implantable Lead Connection Status: 753985
Implantable Lead Connection Status: 753985
Implantable Lead Implant Date: 20200123
Implantable Lead Implant Date: 20200123
Implantable Lead Implant Date: 20200123
Implantable Lead Location: 753858
Implantable Lead Location: 753859
Implantable Lead Location: 753860
Implantable Lead Model: 4598
Implantable Lead Model: 5076
Implantable Lead Model: 5076
Implantable Pulse Generator Implant Date: 20200123
Lead Channel Impedance Value: 1102 Ohm
Lead Channel Impedance Value: 1121 Ohm
Lead Channel Impedance Value: 1140 Ohm
Lead Channel Impedance Value: 1159 Ohm
Lead Channel Impedance Value: 1482 Ohm
Lead Channel Impedance Value: 1501 Ohm
Lead Channel Impedance Value: 361 Ohm
Lead Channel Impedance Value: 399 Ohm
Lead Channel Impedance Value: 456 Ohm
Lead Channel Impedance Value: 475 Ohm
Lead Channel Impedance Value: 494 Ohm
Lead Channel Impedance Value: 817 Ohm
Lead Channel Impedance Value: 836 Ohm
Lead Channel Impedance Value: 855 Ohm
Lead Channel Pacing Threshold Amplitude: 0.375 V
Lead Channel Pacing Threshold Amplitude: 0.625 V
Lead Channel Pacing Threshold Amplitude: 2.75 V
Lead Channel Pacing Threshold Pulse Width: 0.4 ms
Lead Channel Pacing Threshold Pulse Width: 0.4 ms
Lead Channel Pacing Threshold Pulse Width: 0.8 ms
Lead Channel Sensing Intrinsic Amplitude: 1.125 mV
Lead Channel Sensing Intrinsic Amplitude: 1.125 mV
Lead Channel Sensing Intrinsic Amplitude: 5.625 mV
Lead Channel Sensing Intrinsic Amplitude: 5.625 mV
Lead Channel Setting Pacing Amplitude: 1.5 V
Lead Channel Setting Pacing Amplitude: 2.5 V
Lead Channel Setting Pacing Amplitude: 4 V
Lead Channel Setting Pacing Pulse Width: 0.4 ms
Lead Channel Setting Pacing Pulse Width: 0.8 ms
Lead Channel Setting Sensing Sensitivity: 2 mV
Zone Setting Status: 755011
Zone Setting Status: 755011

## 2022-10-22 NOTE — Progress Notes (Signed)
EPIC Encounter for ICM Monitoring  Patient Name: Nicholas Foley is a 71 y.o. male Date: 10/22/2022 Primary Care Physican: [name], MD Primary Cardiologist: Smith/Bensimhon Electrophysiologist: Quentin Ore Nephrologist: [facility]: Dr Joylene Grapes Bi-V Pacing:  96.5% 03/17/2022 Weight: 300 lbs  10/04/2022 Weight: 299 lbs (299-302 baseline)           Spoke with patient and heart failure questions reviewed.  Transmission results reviewed.  Pt asymptomatic for fluid accumulation.  Reports feeling well at this time and voices no complaints.           Optivol thoracic impedance suggesting normal fluid levels since 11/20.   Prescribed: Furosemide 40 mg take 2 tablets (80 mg total) twice a day Potassium 20 mEq take 1 tablet twice a day    Labs: 10/19/2022 Creatinine 2.20, BUN 43, Potassium 4.5, Sodium 138, GFR 32 09/28/2022 Creatinine 2.19, BUN 51, Potassium 4.2, Sodium 135, GFR 32 09/23/2022 Creatinine 2.65, BUN 64, Potassium 5.3, Sodium 138, GFR 25  09/21/2022 Creatinine 2.57, BUN 62, Potassium 4.3, Sodium 136, GFR 26  09/02/2022 Creatinine 1.53, BUN 18, Potassium 4.1, Sodium 139, GFR 49 08/09/2022 Creatinine 1.68, BUN 33, Potassium 4.2, Sodium 139, GFR 44  08/06/2022 Creatinine 1.51, BUN 18, Potassium 3.1, Sodium 139, GFR 50  07/15/2021 Creatinine 1.08, BUN 17, Potassium 3.7, Sodium 138. GFR >60 05/04/2021 Creatinine 1.25, BUN 20, Potassium 4.4, Sodium 136, GFR 63 01/07/2021 Creatinine 1.20, BUN 17, Potassium 3.7, Sodium 139, GFR >60 A complete set of results can be found in Results Review.   Recommendations:  No changes and encouraged to call if experiencing any fluid symptoms.   Follow-up plan: ICM clinic phone appointment on 11/29/2022.   91 day device clinic remote transmission 01/19/2023.     EP/Cardiology Office Visits:   Recall 05/29/2023 with Dr Quentin Ore.  11/18/2022 with Dr Haroldine Laws.  Recall 03/14/2023 with Dr [name].   Copy of ICM check sent to Dr Quentin Ore.    3 month  ICM trend: 10/19/2022.    12-14 Month ICM trend:     Rosalene Billings, RN 10/22/2022 11:57 AM

## 2022-10-27 ENCOUNTER — Telehealth: Payer: Self-pay

## 2022-10-27 NOTE — Telephone Encounter (Signed)
Received call from patient and is requesting review of fluid levels.  He has worked this week due to he is not feeling well. He will send remote transmission for review.

## 2022-10-27 NOTE — Telephone Encounter (Signed)
Spoke with patient feeling tired, numbness on the outside of leg, itchy arms and soreness in lower back.  He realizes these are not typically fluid symptoms but he did want to check the report to see if it could be.  His weight is stable.   Advised remote transmission received which is suggesting he had possible fluid accumulation starting 11/27 and returned to normal today.  He will call PCP if he is not feeling better tomorrow.  He appreciated the call back and fluid level review.     10/27/2022 Optivol report suggesting possible fluid accumulation starting 11/27 and returned to normal today, 12/6.

## 2022-11-01 NOTE — Research (Signed)
Patient stated wife is having health problems and does not think he will have time to collect daily signal patient returned device and withdrew from the study.     [place] Samanvitha [place], Research Coordinator 10/19/2022

## 2022-11-16 ENCOUNTER — Encounter (HOSPITAL_COMMUNITY): Payer: 59 | Admitting: Internal Medicine

## 2022-11-17 ENCOUNTER — Other Ambulatory Visit: Payer: Self-pay | Admitting: Internal Medicine

## 2022-11-17 NOTE — Progress Notes (Signed)
Remote pacemaker transmission.   

## 2022-11-18 ENCOUNTER — Ambulatory Visit (HOSPITAL_COMMUNITY)
Admission: RE | Admit: 2022-11-18 | Discharge: 2022-11-18 | Disposition: A | Discharge status: Home / Self Care | Payer: 59 | Source: Ambulatory Visit | Attending: Internal Medicine | Admitting: Internal Medicine

## 2022-11-18 ENCOUNTER — Encounter (HOSPITAL_COMMUNITY): Payer: Self-pay | Admitting: Internal Medicine

## 2022-11-18 VITALS — BP 104/60 | HR 79 | Wt 303.0 lb

## 2022-11-18 DIAGNOSIS — Z79899 Other long term (current) drug therapy: Secondary | ICD-10-CM | POA: Diagnosis not present

## 2022-11-18 DIAGNOSIS — I480 Paroxysmal atrial fibrillation: Secondary | ICD-10-CM | POA: Insufficient documentation

## 2022-11-18 DIAGNOSIS — N1832 Chronic kidney disease, stage 3b: Secondary | ICD-10-CM | POA: Insufficient documentation

## 2022-11-18 DIAGNOSIS — Z6841 Body Mass Index (BMI) 40.0 and over, adult: Secondary | ICD-10-CM | POA: Diagnosis not present

## 2022-11-18 DIAGNOSIS — I5042 Chronic combined systolic (congestive) and diastolic (congestive) heart failure: Secondary | ICD-10-CM | POA: Insufficient documentation

## 2022-11-18 DIAGNOSIS — Z7901 Long term (current) use of anticoagulants: Secondary | ICD-10-CM | POA: Diagnosis not present

## 2022-11-18 DIAGNOSIS — I447 Left bundle-branch block, unspecified: Secondary | ICD-10-CM | POA: Insufficient documentation

## 2022-11-18 DIAGNOSIS — I428 Other cardiomyopathies: Secondary | ICD-10-CM | POA: Diagnosis not present

## 2022-11-18 DIAGNOSIS — Z87891 Personal history of nicotine dependence: Secondary | ICD-10-CM | POA: Insufficient documentation

## 2022-11-18 DIAGNOSIS — J449 Chronic obstructive pulmonary disease, unspecified: Secondary | ICD-10-CM | POA: Insufficient documentation

## 2022-11-18 DIAGNOSIS — G4733 Obstructive sleep apnea (adult) (pediatric): Secondary | ICD-10-CM | POA: Insufficient documentation

## 2022-11-18 DIAGNOSIS — Z7984 Long term (current) use of oral hypoglycemic drugs: Secondary | ICD-10-CM | POA: Insufficient documentation

## 2022-11-18 DIAGNOSIS — Z950 Presence of cardiac pacemaker: Secondary | ICD-10-CM | POA: Diagnosis not present

## 2022-11-18 DIAGNOSIS — I130 Hypertensive heart and chronic kidney disease with heart failure and stage 1 through stage 4 chronic kidney disease, or unspecified chronic kidney disease: Secondary | ICD-10-CM | POA: Insufficient documentation

## 2022-11-18 LAB — BASIC METABOLIC PANEL
Anion gap: 9 (ref 5–15)
BUN: 33 mg/dL — ABNORMAL HIGH (ref 8–23)
CO2: 22 mmol/L (ref 22–32)
Calcium: 8.7 mg/dL — ABNORMAL LOW (ref 8.9–10.3)
Chloride: 106 mmol/L (ref 98–111)
Creatinine, Ser: 2.06 mg/dL — ABNORMAL HIGH (ref 0.61–1.24)
GFR, Estimated: 34 mL/min — ABNORMAL LOW (ref >60)
Glucose, Bld: 118 mg/dL — ABNORMAL HIGH (ref 70–99)
Potassium: 4.4 mmol/L (ref 3.5–5.1)
Sodium: 137 mmol/L (ref 135–145)

## 2022-11-18 LAB — BRAIN NATRIURETIC PEPTIDE: B Natriuretic Peptide: 41.7 pg/mL (ref 0.0–100.0)

## 2022-11-18 NOTE — Patient Instructions (Signed)
Take Metolazone 2.5 mg with an extra potassium tablet.  Labs done today, your results will be available in MyChart, we will contact you for abnormal readings.  Your physician recommends that you schedule a follow-up appointment as scheduled   If you have any questions or concerns before your next appointment please send [place] a message through [place] or call our office at [phone].    TO LEAVE A MESSAGE FOR THE NURSE SELECT OPTION 2, PLEASE LEAVE A MESSAGE INCLUDING: YOUR NAME DATE OF BIRTH CALL BACK NUMBER REASON FOR CALL**this is important as we prioritize the call backs  YOU WILL RECEIVE A CALL BACK THE SAME DAY AS LONG AS YOU CALL BEFORE 4:00 PM  At the [facility], you and your health needs are our priority. As part of our continuing mission to provide you with exceptional heart care, we have created designated Provider Care Teams. These Care Teams include your primary Cardiologist (physician) and Advanced Practice Providers (APPs- Physician Assistants and Nurse Practitioners) who all work together to provide you with the care you need, when you need it.   You may see any of the following providers on your designated Care Team at your next follow up: Dr Glori Bickers Dr Loralie Champagne Dr. Roxana Hires, NP Lyda Jester, [place] [facility] [place], [place] Forestine Na, NP Audry Riles, PharmD   Please be sure to bring in all your medications bottles to every appointment.

## 2022-11-18 NOTE — Progress Notes (Signed)
Advanced Heart Failure Clinic Note   Date:  11/18/2022   ID:  Nicholas Foley, DOB January 26, 1951, MRN [id number]  Location: Home  Provider location: [facility] Type of Visit: Established patient  PCP:  [name], MD  Primary Cardiologist:  [name], MD Nephrology: Dr. Joylene Grapes HF Cardiologist: Dr. Haroldine Laws  Chief Complaint: Heart Failure follow-up    HPI:  Nicholas Foley is a 71 y.o. with morbid obesity, chronic systolic heart failure, COPD, OSA, PAF, previously on amio-> stopped 2018,  HTN, LBBB, Medtronic CRT-P, and former smoker.   He has h/o NICM which seems to date back to 2014. Had cardiac cath in 1/20 with no CAD and well compensated filling pressures.    Admitted in 1/20 for CRT-P device in setting of LBBB. Intially he felt a lot better but has gradually declined.   Echo 01/30/20 EF 40-45% (read as 35-40%)  Echo EF 2/22 45% (in some images 45-50%) Limited by septal paradox.   Got COVID again in 1/22 but was a mild case.   Had recurrent AF. Underwent DC-CV 8/22.   Follow up 5/23, NYHA I-II, fluid stable. Echo 04/12/22 showed EF 50-55%, LV dyysynchrony, grade I DD,  RV ok.   DCCV 9/23 unsuccessful x 3. Amio started at AF Clinic follow up, re-attempted DCCV 09/03/22, but unsuccessful. Saw Dr. Quentin Ore 09/23/22, planning for AF ablation. Re-attempt at [place] with higher joules arranged in the meantime. S/p DCCV 09/28/22 successful conversion to NSR.  Started accumulating fluid 09/27/22, advised to increase lasix back to 80 mg bid and follow up with AHF.   He is here for routine f/u. Feels ok. Back to the gym doing cardio and weights. Feels like he has some fluid on board but is reluctant to take metolazone due to CKD. Currently on lasix 80 bid. Following with Dr. Joylene Grapes in [facility]. Pending AF ablation in February  Cardiac Studies  - Echo (5/23): EF 50-55%, severe LV dyysynchrony, grade I DD< RV ok,   - Echo (2/22): EF45%  - Echo (3/21):  EF 40-45% (read as 35-40%).  - CPX (09/18/19):  Limited by obesity and mild heart failure Peak VO2 15.1  (80% predicted peak VO2) - when corrected to ibw pVO2 27.5 Slope 33 RER 1.02    - L/RHC (1/20) No coronary disease.  RA 13 PA 37/20 (27)  PCWP 14 CO 8.5 CI 3.3    - Echo (11/20): RV normal EF 45-50%   - Echo (11/19): EF 35-40% Grade II DD      Past Medical History:  Diagnosis Date   Atrial fibrillation with rapid ventricular response ([place]) 08/02/2013   CHF (congestive heart failure) (HCC)    Chronic systolic dysfunction of left ventricle 08/02/2013   BNP greater than 2000    COPD (chronic obstructive pulmonary disease) (HCC)    Moderate by PFTs with response to bronchodilators, May 2014   Erectile dysfunction    Hypertension    Insomnia 08/02/13   Left bundle branch block 08/02/2013   Morbid obesity ([place]) 08/02/2013   OSA (obstructive sleep apnea)    severe OSA with AHI 74/hr now on CPAP at 8cm H2O   Past Surgical History:  Procedure Laterality Date   BIV PACEMAKER INSERTION CRT-P N/A 12/14/2018   Medtronic model [id number] Percepta Quad CRT-P MRI Surescan (serial Number [id number]) device implanted by Dr [name] for CHF and LBBB   CARDIOVERSION N/A 08/06/2013   Procedure: CARDIOVERSION;  Surgeon: Lelon Perla,  MD;  Location: [place];  Service: Cardiovascular;  Laterality: N/A;  spoke with [name]    CARDIOVERSION N/A 09/03/2013   Procedure: CARDIOVERSION;  Surgeon: [name], MD;  Location: [place];  Service: Cardiovascular;  Laterality: N/A;   CARDIOVERSION N/A 07/21/2021   Procedure: CARDIOVERSION;  Surgeon: Larey Dresser, MD;  Location: [facility] ENDOSCOPY;  Service: Cardiovascular;  Laterality: N/A;   CARDIOVERSION N/A 08/13/2022   Procedure: CARDIOVERSION;  Surgeon: Josue Hector, MD;  Location: [facility] ENDOSCOPY;  Service: Cardiovascular;  Laterality: N/A;   CARDIOVERSION N/A 09/03/2022   Procedure: CARDIOVERSION;  Surgeon: Jolaine Artist, MD;   Location: [facility] ENDOSCOPY;  Service: Cardiovascular;  Laterality: N/A;   CARDIOVERSION N/A 09/28/2022   Procedure: CARDIOVERSION;  Surgeon: Constance Haw, MD;  Location: [place] CV LAB;  Service: Cardiovascular;  Laterality: N/A;   chronic systolic dysfu     NASAL SEPTUM SURGERY     RIGHT/LEFT HEART CATH AND CORONARY ANGIOGRAPHY N/A 12/04/2018   Procedure: RIGHT/LEFT HEART CATH AND CORONARY ANGIOGRAPHY;  Surgeon: Belva Crome, MD;  Location: [place] CV LAB;  Service: Cardiovascular;  Laterality: N/A;   TEE WITHOUT CARDIOVERSION N/A 08/06/2013   Procedure: TRANSESOPHAGEAL ECHOCARDIOGRAM (TEE);  Surgeon: Lelon Perla, MD;  Location: [facility] ENDOSCOPY;  Service: Cardiovascular;  Laterality: N/A;   VASECTOMY     Current Outpatient Medications  Medication Sig Dispense Refill   acetaminophen (TYLENOL) 650 MG CR tablet Take 1,300 mg by mouth in the morning and at bedtime. Arthritis strength     ADVAIR DISKUS 250-50 MCG/DOSE AEPB Inhale 1 puff into the lungs 2 (two) times daily.     amiodarone (PACERONE) 200 MG tablet Take 200 mg by mouth daily.     carvedilol (COREG) 25 MG tablet Take 1 tablet (25 mg total) by mouth 2 (two) times daily with a meal. 180 tablet 3   cholecalciferol (VITAMIN D3) 25 MCG (1000 UT) tablet Take 1,000 Units by mouth daily.     CIALIS 20 MG tablet Take 20 mg by mouth daily as needed for erectile dysfunction.   0   cyclobenzaprine (FLEXERIL) 10 MG tablet Take 10 mg by mouth 3 (three) times daily as needed for muscle spasms.     ELIQUIS 5 MG TABS tablet TAKE 1 TABLET(5 MG) BY MOUTH TWICE DAILY 180 tablet 2   empagliflozin (JARDIANCE) 10 MG TABS tablet Take 1 tablet (10 mg total) by mouth daily before breakfast. 90 tablet 3   ENTRESTO 97-103 MG TAKE 1 TABLET BY MOUTH TWICE DAILY 180 tablet 3   fluticasone (FLONASE) 50 MCG/ACT nasal spray Place 1 spray into both nostrils 2 (two) times daily.   0   furosemide (LASIX) 80 MG tablet Take 1 tablet (80 mg total) by mouth 2  (two) times daily. 360 tablet 3   metolazone (ZAROXOLYN) 2.5 MG tablet Take 1 tablet (2.5 mg total) by mouth once for 1 dose. Take 1 tablet today with extra of 40 meq of Potassium 10 tablet 0   Multiple Vitamins-Minerals (PRESERVISION AREDS PO) Take 1 capsule by mouth 2 (two) times daily.     omeprazole (PRILOSEC OTC) 20 MG tablet Take 20 mg by mouth daily.     Potassium Chloride (KLOR-CON PO) Take 20 mg by mouth 2 (two) times daily.     spironolactone (ALDACTONE) 25 MG tablet Take 1 tablet (25 mg total) by mouth daily. 90 tablet 1   tamsulosin (FLOMAX) 0.4 MG CAPS capsule Take 0.4 mg by mouth daily.  vitamin B-12 (CYANOCOBALAMIN) 1000 MCG tablet Take 1,000 mcg by mouth daily.     zolpidem (AMBIEN CR) 12.5 MG CR tablet Take 12.5 mg by mouth at bedtime as needed for sleep.     No current facility-administered medications for this encounter.    Allergies:   Ace inhibitors and Angiotensin receptor blockers   Social History:  The patient  reports that he quit smoking about 10 years ago. His smoking use included cigarettes and cigars. He has a 64.50 pack-year smoking history. He has never used smokeless tobacco. He reports current alcohol use. He reports that he does not use drugs.   Family History:  The patient's family history includes Atrial fibrillation in his mother; COPD in his father; Cerebral aneurysm in his brother; Healthy in his daughter.   ROS:  Please see the history of present illness.   All other systems are personally reviewed and negative.   Recent Labs: 09/23/2022: Hemoglobin 15.0; Platelets 250 10/19/2022: B Natriuretic Peptide 42.6; BUN 43; Creatinine, Ser 2.20; Potassium 4.5; Sodium 138  Personally reviewed   Wt Readings from Last 3 Encounters:  11/18/22 (!) 137.4 kg (303 lb)  10/11/22 (!) 137 kg (302 lb)  09/28/22 135.6 kg (299 lb)    BP 104/60   Pulse 79   Wt (!) 137.4 kg (303 lb)   SpO2 96%   BMI 42.26 kg/m   Physical Exam General:  Well appearing. No resp  difficulty HEENT: normal Neck: supple. no JVD. Carotids 2+ bilat; no bruits. No lymphadenopathy or thryomegaly appreciated. Cor: PMI nondisplaced. Regular rate & rhythm. No rubs, gallops or murmurs. Lungs: clear Abdomen: obese  soft, nontender, nondistended. No hepatosplenomegaly. No bruits or masses. Good bowel sounds. Extremities: no cyanosis, clubbing, rash, edema Neuro: alert & orientedx3, cranial nerves grossly intact. moves all 4 extremities w/o difficulty. Affect pleasant  ECG NSR with v pacing 79 No ST-T wave abnormalities.   Device interrogation (personally reviewed): OptiVol up, thoracic impedence down, no further AF since most recent DC-CV, 2 hrs day/activity.  ASSESSMENT AND PLAN: Chronic Combined Systolic/Diastolic HF - NICM (thought due to LBBB). 12/2018 LHC no coronary disease.  - s/p Medtronic CRT-P - Echo (1/19) EF 35-40%. ECHO 11/19 EF 45-50%.  - Echo (11/20): EF read as 35-40% but with Definity I felt closed to 45% - Echo (3/21): EF read as 35-40% I think 40-45% - Echo EF (2/22): EF 45% (in some images 45-50%) Limited by septal paradox. - CPX test (10/20) suboptimal effort mostly limited to obesity  - Echo (5/23) EF 50-55%, LV dyysynchrony, grade I DD, RV ok.  - NYHA II-early III, volume OK on exam but up on device interrogation. Will give metolazone 2.5 and K 40 x 1 (ok to repeat this every 2-3 weeks as he needs it) - Continue Lasix 80 mg bid. - Continue KCL 20 bid. - Continue carvedilol 25 mg bid. - Continue Entresto 97-103 mg bid. - Continue spiro 25 mg daily. - Continue Jadiance 10 mg daily. - Repeat blood work today - RTC in 6 weeks to make sure stable    2. PAF  - In the past he was on amiodarone. Stopped ~2017 - Had recurrent AF. Underwent DC-CV 8/22. Followed by AF Clinic.  - Failed DCCV x 3 (9/23) and x 1 (10/23). - CHA2DS2Vasc is 3. - Continue Eliquis 5 bid. No bleeding.  - Continue amiodarone 200 mg daily. - s/p successful DC-CV 11/23 with higher  joules. - Remains in SR per device interrogation. - AF  ablation scheduled for 2/24 with Dr. Quentin Ore   3. OSA - Compliant with CPAP   4. Obesity -  Body mass index is 42.26 kg/m.  -  Continue weight loss efforts. Recently lost 40 pounds -  Insurance denied [employer].  5. COPD  - No longer smoking.  6. CKD 3b - Last SCr 2.20 on 10/03/22  - Continue SGLT2i. - Followed by Dr. Osborne Casco at [place] renal function with metolazone.  - Labs today  Nicholas Bickers, MD  11/18/2022 10:43 AM  Advanced Heart Failure [place] [address] [phone] (office) [phone] (fax)

## 2022-11-29 ENCOUNTER — Ambulatory Visit (INDEPENDENT_AMBULATORY_CARE_PROVIDER_SITE_OTHER): Payer: 59

## 2022-11-29 DIAGNOSIS — Z950 Presence of cardiac pacemaker: Secondary | ICD-10-CM

## 2022-11-29 DIAGNOSIS — I5042 Chronic combined systolic (congestive) and diastolic (congestive) heart failure: Secondary | ICD-10-CM

## 2022-11-29 NOTE — Progress Notes (Unsigned)
EPIC Encounter for ICM Monitoring  Patient Name: Nicholas Foley is a 72 y.o. male Date: 11/29/2022 Primary Care Physican: [name], MD Primary Cardiologist: [facility] Electrophysiologist: Quentin Ore Nephrologist: [facility]: Dr Joylene Grapes Bi-V Pacing:  98.4% 03/17/2022 Weight: 300 lbs  10/04/2022 Weight: 299 lbs (299-302 baseline) 11/26/2022 Weight: 300 lbs (has not weighed last 2 days)   Since 18-Nov-2022 Time in AT/AF 0.0 hr/day (0.0%)        Spoke with patient and heart failure questions reviewed.  Transmission results reviewed.  Pt has SOB, lack of energy and weight fluctuating a couple of pounds.  He also has a respiratory virus and not sure if it the virus or        fluid causing his symptoms.       12/30/2022 ablation scheduled         Optivol thoracic impedance suggesting possible fluid accumulation starting 11/22/2022.  Appears there is not any AT/AF since 09/27/2022 Cardioversion.   Prescribed: Furosemide 40 mg take 2 tablets (80 mg total) twice a day Potassium 20 mEq take 1 tablet twice a day  Metolazone 2.5 mg take 1 tablet for 1 dose with 40 mEq of Potassium   Labs: 12/07/2022 BMET scheduled 10/19/2022 Creatinine 2.20, BUN 43, Potassium 4.5, Sodium 138, GFR 32 09/28/2022 Creatinine 2.19, BUN 51, Potassium 4.2, Sodium 135, GFR 32 09/23/2022 Creatinine 2.65, BUN 64, Potassium 5.3, Sodium 138, GFR 25  09/21/2022 Creatinine 2.57, BUN 62, Potassium 4.3, Sodium 136, GFR 26  09/02/2022 Creatinine 1.53, BUN 18, Potassium 4.1, Sodium 139, GFR 49 08/09/2022 Creatinine 1.68, BUN 33, Potassium 4.2, Sodium 139, GFR 44  08/06/2022 Creatinine 1.51, BUN 18, Potassium 3.1, Sodium 139, GFR 50  07/15/2021 Creatinine 1.08, BUN 17, Potassium 3.7, Sodium 138. GFR >60 05/04/2021 Creatinine 1.25, BUN 20, Potassium 4.4, Sodium 136, GFR 63 01/07/2021 Creatinine 1.20, BUN 17, Potassium 3.7, Sodium 139, GFR >60 A complete set of results can be found in Results Review.    Recommendations:  Copy sent to Dr Haroldine Laws for review and recommendations if needed.    Follow-up plan: ICM clinic phone appointment on 12/06/2022 to recheck fluid levels.   91 day device clinic remote transmission 01/19/2023.     EP/Cardiology Office Visits:   Recall 05/29/2023 with Dr Quentin Ore.  01/03/2023 with HF clinic.  Recall 03/14/2023 with Dr [name].   Copy of ICM check sent to Dr Quentin Ore.    3 month ICM trend: 11/29/2022.    12-14 Month ICM trend:     Rosalene Billings, RN 11/29/2022 10:25 AM

## 2022-12-06 ENCOUNTER — Ambulatory Visit (INDEPENDENT_AMBULATORY_CARE_PROVIDER_SITE_OTHER): Payer: 59

## 2022-12-06 DIAGNOSIS — Z950 Presence of cardiac pacemaker: Secondary | ICD-10-CM

## 2022-12-06 DIAGNOSIS — I5042 Chronic combined systolic (congestive) and diastolic (congestive) heart failure: Secondary | ICD-10-CM

## 2022-12-06 NOTE — Progress Notes (Signed)
EPIC Encounter for ICM Monitoring  Patient Name: Nicholas Foley is a 72 y.o. male Date: 12/06/2022 Primary Care Physican: [name], MD rimary Cardiologist: [facility] Electrophysiologist: Quentin Ore Nephrologist: [facility]: Dr Joylene Grapes Bi-V Pacing:  98.2% 03/17/2022 Weight: 300 lbs  10/04/2022 Weight: 299 lbs (299-302 baseline) 11/26/2022 Weight: 300 lbs (has not weighed last 2 days) 12/05/2022 Weight: 297.22 lbs 12/06/2022 Weight: 299.5 lbs   Clinical Status (29-Nov-2022 to 06-Dec-2022) Time in AT/AF  0.0 hr/day (0.0%)         Spoke with patient and heart failure questions reviewed.  Transmission results reviewed.  Pt is feeling better today and over the weekend.  Resp virus is resolving and feels like fluid accumulation has resolved. 12/10/2022 with Dr Joylene Grapes (nephrologist).          Optivol thoracic impedance suggesting fluid levels returned to normal 1/15.    Prescribed: Furosemide 40 mg take 2 tablets (80 mg total) twice a day Potassium 20 mEq take 1 tablet twice a day  Metolazone 2.5 mg take 1 tablet for 1 dose with 40 mEq of Potassium   Labs: 11/18/2022 Creatinine 2.06, BUN 33, Potassium 4.4, Sodium 137 10/19/2022 Creatinine 2.20, BUN 43, Potassium 4.5, Sodium 138, GFR 32 09/28/2022 Creatinine 2.19, BUN 51, Potassium 4.2, Sodium 135, GFR 32 09/23/2022 Creatinine 2.65, BUN 64, Potassium 5.3, Sodium 138, GFR 25  09/21/2022 Creatinine 2.57, BUN 62, Potassium 4.3, Sodium 136, GFR 26  09/02/2022 Creatinine 1.53, BUN 18, Potassium 4.1, Sodium 139, GFR 49 08/09/2022 Creatinine 1.68, BUN 33, Potassium 4.2, Sodium 139, GFR 44  08/06/2022 Creatinine 1.51, BUN 18, Potassium 3.1, Sodium 139, GFR 50  07/15/2021 Creatinine 1.08, BUN 17, Potassium 3.7, Sodium 138. GFR >60 05/04/2021 Creatinine 1.25, BUN 20, Potassium 4.4, Sodium 136, GFR 63 01/07/2021 Creatinine 1.20, BUN 17, Potassium 3.7, Sodium 139, GFR >60 A complete set of results can be found in Results Review.    Recommendations:  No changes and encouraged to call if experiencing any fluid symptoms.   Follow-up plan: ICM clinic phone appointment on 01/03/2023.   91 day device clinic remote transmission 01/19/2023.     EP/Cardiology Office Visits:   12/10/2022 with Dr Joylene Grapes (nephrologist).  Recall 05/29/2023 with Dr Quentin Ore.  01/03/2023 with HF clinic.  Recall 03/14/2023 with Dr [name].   Copy of ICM check sent to Dr Quentin Ore.    3 month ICM trend: 12/06/2022.    12-14 Month ICM trend:     Rosalene Billings, RN 12/06/2022 3:05 PM

## 2022-12-07 ENCOUNTER — Other Ambulatory Visit: Payer: 59

## 2022-12-08 ENCOUNTER — Ambulatory Visit: Payer: 59 | Attending: Cardiology

## 2022-12-08 DIAGNOSIS — I5042 Chronic combined systolic (congestive) and diastolic (congestive) heart failure: Secondary | ICD-10-CM

## 2022-12-08 DIAGNOSIS — Z950 Presence of cardiac pacemaker: Secondary | ICD-10-CM

## 2022-12-08 DIAGNOSIS — Z01818 Encounter for other preprocedural examination: Secondary | ICD-10-CM

## 2022-12-08 DIAGNOSIS — I4819 Other persistent atrial fibrillation: Secondary | ICD-10-CM

## 2022-12-08 LAB — CBC WITH DIFFERENTIAL/PLATELET
Basophils Absolute: 0.1 10*3/uL (ref 0.0–0.2)
Basos: 1 %
EOS (ABSOLUTE): 0.2 10*3/uL (ref 0.0–0.4)
Eos: 2 %
Hematocrit: 40.9 % (ref 37.5–51.0)
Hemoglobin: 14 g/dL (ref 13.0–17.7)
Lymphocytes Absolute: 2.2 10*3/uL (ref 0.7–3.1)
Lymphs: 23 %
MCH: 32.7 pg (ref 26.6–33.0)
MCHC: 34.2 g/dL (ref 31.5–35.7)
MCV: 96 fL (ref 79–97)
Monocytes Absolute: 1.1 10*3/uL — ABNORMAL HIGH (ref 0.1–0.9)
Monocytes: 11 %
Neutrophils Absolute: 6.4 10*3/uL (ref 1.4–7.0)
Neutrophils: 63 %
Platelets: 238 10*3/uL (ref 150–450)
RBC: 4.28 x10E6/uL (ref 4.14–5.80)
RDW: 13.8 % (ref 11.6–15.4)
WBC: 10 10*3/uL (ref 3.4–10.8)

## 2022-12-08 LAB — BASIC METABOLIC PANEL
BUN/Creatinine Ratio: 17 (ref 10–24)
BUN: 32 mg/dL — ABNORMAL HIGH (ref 8–27)
CO2: 24 mmol/L (ref 20–29)
Calcium: 9 mg/dL (ref 8.6–10.2)
Chloride: 102 mmol/L (ref 96–106)
Creatinine, Ser: 1.84 mg/dL — ABNORMAL HIGH (ref 0.76–1.27)
Glucose: 112 mg/dL — ABNORMAL HIGH (ref 70–99)
Potassium: 4.2 mmol/L (ref 3.5–5.2)
Sodium: 137 mmol/L (ref 134–144)
eGFR: 39 mL/min/{1.73_m2} — ABNORMAL LOW (ref >59)

## 2022-12-21 ENCOUNTER — Encounter: Payer: Self-pay | Admitting: Internal Medicine

## 2022-12-27 ENCOUNTER — Telehealth: Payer: Self-pay | Admitting: Cardiology

## 2022-12-27 ENCOUNTER — Telehealth: Payer: Self-pay

## 2022-12-27 NOTE — Telephone Encounter (Signed)
Pt called in asking if someone can call him to go over ablation instructions.

## 2022-12-27 NOTE — Telephone Encounter (Signed)
Returned patient call as requested.  He has not been feeling well this past week and sent remote transmission for review.  Weight has been fluctuating with highest being 5 lb weight gain.  He has not had any energy and generally not feeling well. He is better today but wanted to know if report is showing afib or fluid.  Advised no afib but does show intermittent fluid accumulation over the last 2 weeks but has returned to normal on 2/4.  He has ablation scheduled for 2/8.

## 2022-12-27 NOTE — Telephone Encounter (Signed)
Spoke with the patient and reviewed instructions with him. Patient verbalized understanding.

## 2022-12-29 NOTE — Pre-Procedure Instructions (Addendum)
Attempted to call patient regarding procedure instructions.  Left voicemail on the following items: Arrival time 0515 Nothing to eat or drink after midnight No meds AM of procedure Responsible person to drive you home and stay with you for 24 hrs  Have you missed any doses of anti-coagulant Eliquis- if you have missed any doses please let office know right away.  Don't take eliquis the am before procedure.   1357 Patient call back to confirm instructions for tomorrow.

## 2022-12-30 ENCOUNTER — Encounter (HOSPITAL_COMMUNITY)
Admission: RE | Disposition: A | Discharge status: Home / Self Care | Payer: Self-pay | Source: Home / Self Care | Attending: Cardiology

## 2022-12-30 ENCOUNTER — Ambulatory Visit (HOSPITAL_BASED_OUTPATIENT_CLINIC_OR_DEPARTMENT_OTHER): Payer: 59

## 2022-12-30 ENCOUNTER — Encounter (HOSPITAL_COMMUNITY): Payer: Self-pay | Admitting: *Deleted

## 2022-12-30 ENCOUNTER — Other Ambulatory Visit (HOSPITAL_COMMUNITY): Payer: Self-pay

## 2022-12-30 ENCOUNTER — Ambulatory Visit (HOSPITAL_COMMUNITY)
Admission: RE | Admit: 2022-12-30 | Discharge: 2022-12-30 | Disposition: A | Discharge status: Home / Self Care | Payer: 59 | Attending: Cardiology | Admitting: Cardiology

## 2022-12-30 ENCOUNTER — Ambulatory Visit (HOSPITAL_BASED_OUTPATIENT_CLINIC_OR_DEPARTMENT_OTHER): Payer: 59 | Admitting: Anesthesiology

## 2022-12-30 ENCOUNTER — Ambulatory Visit (HOSPITAL_COMMUNITY): Payer: 59 | Admitting: Anesthesiology

## 2022-12-30 DIAGNOSIS — I509 Heart failure, unspecified: Secondary | ICD-10-CM

## 2022-12-30 DIAGNOSIS — I340 Nonrheumatic mitral (valve) insufficiency: Secondary | ICD-10-CM

## 2022-12-30 DIAGNOSIS — E669 Obesity, unspecified: Secondary | ICD-10-CM | POA: Insufficient documentation

## 2022-12-30 DIAGNOSIS — Z6841 Body Mass Index (BMI) 40.0 and over, adult: Secondary | ICD-10-CM | POA: Insufficient documentation

## 2022-12-30 DIAGNOSIS — Z87891 Personal history of nicotine dependence: Secondary | ICD-10-CM | POA: Diagnosis not present

## 2022-12-30 DIAGNOSIS — I4819 Other persistent atrial fibrillation: Secondary | ICD-10-CM

## 2022-12-30 DIAGNOSIS — Z8249 Family history of ischemic heart disease and other diseases of the circulatory system: Secondary | ICD-10-CM | POA: Diagnosis not present

## 2022-12-30 DIAGNOSIS — Z950 Presence of cardiac pacemaker: Secondary | ICD-10-CM | POA: Insufficient documentation

## 2022-12-30 DIAGNOSIS — Z7901 Long term (current) use of anticoagulants: Secondary | ICD-10-CM | POA: Insufficient documentation

## 2022-12-30 DIAGNOSIS — J449 Chronic obstructive pulmonary disease, unspecified: Secondary | ICD-10-CM | POA: Insufficient documentation

## 2022-12-30 DIAGNOSIS — I5022 Chronic systolic (congestive) heart failure: Secondary | ICD-10-CM | POA: Insufficient documentation

## 2022-12-30 DIAGNOSIS — I110 Hypertensive heart disease with heart failure: Secondary | ICD-10-CM | POA: Diagnosis not present

## 2022-12-30 DIAGNOSIS — I3139 Other pericardial effusion (noninflammatory): Secondary | ICD-10-CM

## 2022-12-30 DIAGNOSIS — I4891 Unspecified atrial fibrillation: Secondary | ICD-10-CM | POA: Diagnosis not present

## 2022-12-30 DIAGNOSIS — G4733 Obstructive sleep apnea (adult) (pediatric): Secondary | ICD-10-CM | POA: Insufficient documentation

## 2022-12-30 HISTORY — PX: TEE WITHOUT CARDIOVERSION: SHX5443

## 2022-12-30 HISTORY — PX: ATRIAL FIBRILLATION ABLATION: EP1191

## 2022-12-30 SURGERY — ATRIAL FIBRILLATION ABLATION
Anesthesia: General

## 2022-12-30 MED ORDER — PHENYLEPHRINE HCL-NACL 20-0.9 MG/250ML-% IV SOLN
INTRAVENOUS | Status: DC | PRN
  Administered 2022-12-30: 25 ug/min via INTRAVENOUS

## 2022-12-30 MED ORDER — ROCURONIUM BROMIDE 10 MG/ML (PF) SYRINGE
PREFILLED_SYRINGE | INTRAVENOUS | Status: DC | PRN
  Administered 2022-12-30: 20 mg via INTRAVENOUS
  Administered 2022-12-30: 70 mg via INTRAVENOUS
  Administered 2022-12-30: 10 mg via INTRAVENOUS

## 2022-12-30 MED ORDER — HEPARIN (PORCINE) IN NACL 1000-0.9 UT/500ML-% IV SOLN
INTRAVENOUS | Status: AC
  Filled 2022-12-30: qty 1500

## 2022-12-30 MED ORDER — ONDANSETRON HCL 4 MG/2ML IJ SOLN: 4.0000 mg | Freq: Four times a day (QID) | INTRAMUSCULAR | Status: DC | PRN

## 2022-12-30 MED ORDER — ACETAMINOPHEN 325 MG PO TABS
650.0000 mg | ORAL_TABLET | ORAL | Status: DC | PRN
  Administered 2022-12-30: 650 mg via ORAL
  Filled 2022-12-30: qty 2

## 2022-12-30 MED ORDER — HEPARIN SODIUM (PORCINE) 1000 UNIT/ML IJ SOLN
INTRAMUSCULAR | Status: DC | PRN
  Administered 2022-12-30: 19000 [IU] via INTRAVENOUS
  Administered 2022-12-30: 3000 [IU] via INTRAVENOUS

## 2022-12-30 MED ORDER — ONDANSETRON HCL 4 MG/2ML IJ SOLN: 4.0000 mg | Freq: Once | INTRAMUSCULAR | Status: DC | PRN

## 2022-12-30 MED ORDER — SODIUM CHLORIDE 0.9 % IV SOLN: 250.0000 mL | INTRAVENOUS | Status: DC | PRN

## 2022-12-30 MED ORDER — SODIUM CHLORIDE 0.9% FLUSH: 3.0000 mL | Freq: Two times a day (BID) | INTRAVENOUS | Status: DC

## 2022-12-30 MED ORDER — COLCHICINE 0.6 MG PO TABS
0.6000 mg | ORAL_TABLET | Freq: Every day | ORAL | 0 refills | Status: DC
  Filled 2022-12-30: qty 5, 5d supply, fill #0

## 2022-12-30 MED ORDER — SODIUM CHLORIDE 0.9% FLUSH: 3.0000 mL | INTRAVENOUS | Status: DC | PRN

## 2022-12-30 MED ORDER — SUGAMMADEX SODIUM 200 MG/2ML IV SOLN
INTRAVENOUS | Status: DC | PRN
  Administered 2022-12-30: 400 mg via INTRAVENOUS

## 2022-12-30 MED ORDER — FENTANYL CITRATE (PF) 100 MCG/2ML IJ SOLN
INTRAMUSCULAR | Status: DC | PRN
  Administered 2022-12-30 (×2): 50 ug via INTRAVENOUS

## 2022-12-30 MED ORDER — HEPARIN SODIUM (PORCINE) 1000 UNIT/ML IJ SOLN
INTRAMUSCULAR | Status: AC
  Filled 2022-12-30: qty 10

## 2022-12-30 MED ORDER — MIDAZOLAM HCL 2 MG/2ML IJ SOLN
INTRAMUSCULAR | Status: DC | PRN
  Administered 2022-12-30: 2 mg via INTRAVENOUS

## 2022-12-30 MED ORDER — FUROSEMIDE 10 MG/ML IJ SOLN
40.0000 mg | Freq: Once | INTRAMUSCULAR | Status: AC
  Administered 2022-12-30: 40 mg via INTRAVENOUS

## 2022-12-30 MED ORDER — FENTANYL CITRATE (PF) 100 MCG/2ML IJ SOLN: 25.0000 ug | INTRAMUSCULAR | Status: DC | PRN

## 2022-12-30 MED ORDER — PROPOFOL 10 MG/ML IV BOLUS
INTRAVENOUS | Status: DC | PRN
  Administered 2022-12-30: 160 mg via INTRAVENOUS

## 2022-12-30 MED ORDER — FUROSEMIDE 10 MG/ML IJ SOLN
INTRAMUSCULAR | Status: AC
  Filled 2022-12-30: qty 4

## 2022-12-30 MED ORDER — ACETAMINOPHEN 500 MG PO TABS
1000.0000 mg | ORAL_TABLET | Freq: Once | ORAL | Status: AC
  Administered 2022-12-30: 1000 mg via ORAL
  Filled 2022-12-30: qty 2

## 2022-12-30 MED ORDER — DEXTROSE 5 % IV SOLN
INTRAVENOUS | Status: DC | PRN
  Administered 2022-12-30: 3 g via INTRAVENOUS

## 2022-12-30 MED ORDER — LIDOCAINE 2% (20 MG/ML) 5 ML SYRINGE
INTRAMUSCULAR | Status: DC | PRN
  Administered 2022-12-30: 100 mg via INTRAVENOUS

## 2022-12-30 MED ORDER — PANTOPRAZOLE SODIUM 40 MG PO TBEC
40.0000 mg | DELAYED_RELEASE_TABLET | Freq: Every day | ORAL | 0 refills | Status: DC
  Filled 2022-12-30: qty 45, 45d supply, fill #0

## 2022-12-30 MED ORDER — HEPARIN (PORCINE) IN NACL 1000-0.9 UT/500ML-% IV SOLN
INTRAVENOUS | Status: DC | PRN
  Administered 2022-12-30 (×3): 500 mL

## 2022-12-30 MED ORDER — APIXABAN 5 MG PO TABS
5.0000 mg | ORAL_TABLET | Freq: Two times a day (BID) | ORAL | Status: DC
  Administered 2022-12-30: 5 mg via ORAL
  Filled 2022-12-30: qty 1

## 2022-12-30 MED ORDER — SODIUM CHLORIDE 0.9 % IV SOLN: INTRAVENOUS | Status: DC

## 2022-12-30 MED ORDER — PROTAMINE SULFATE 10 MG/ML IV SOLN
INTRAVENOUS | Status: DC | PRN
  Administered 2022-12-30: 10 mg via INTRAVENOUS
  Administered 2022-12-30: 35 mg via INTRAVENOUS

## 2022-12-30 MED ORDER — HEPARIN SODIUM (PORCINE) 1000 UNIT/ML IJ SOLN
INTRAMUSCULAR | Status: DC | PRN
  Administered 2022-12-30: 1000 [IU] via INTRAVENOUS

## 2022-12-30 MED ORDER — PHENYLEPHRINE 80 MCG/ML (10ML) SYRINGE FOR IV PUSH (FOR BLOOD PRESSURE SUPPORT)
PREFILLED_SYRINGE | INTRAVENOUS | Status: DC | PRN
  Administered 2022-12-30: 40 ug via INTRAVENOUS

## 2022-12-30 MED ORDER — ONDANSETRON HCL 4 MG/2ML IJ SOLN
INTRAMUSCULAR | Status: DC | PRN
  Administered 2022-12-30: 4 mg via INTRAVENOUS

## 2022-12-30 MED ORDER — DEXAMETHASONE SODIUM PHOSPHATE 10 MG/ML IJ SOLN
INTRAMUSCULAR | Status: DC | PRN
  Administered 2022-12-30: 8 mg via INTRAVENOUS

## 2022-12-30 SURGICAL SUPPLY — 19 items
BAG SNAP BAND KOVER 36X36 (MISCELLANEOUS) IMPLANT
CATH 8FR REPROCESSED SOUNDSTAR (CATHETERS) ×1 IMPLANT
CATH 8FR SOUNDSTAR REPROCESSED (CATHETERS) IMPLANT
CATH ABLAT QDOT MICRO BI TC DF (CATHETERS) IMPLANT
CATH OCTARAY 2.0 F 3-3-3-3-3 (CATHETERS) IMPLANT
CATH S-M CIRCA TEMP PROBE (CATHETERS) IMPLANT
CATH WEB BI DIR CSDF CRV REPRO (CATHETERS) IMPLANT
CLOSURE PERCLOSE PROSTYLE (VASCULAR PRODUCTS) IMPLANT
COVER SWIFTLINK CONNECTOR (BAG) ×1 IMPLANT
PACK EP LATEX FREE (CUSTOM PROCEDURE TRAY) ×1
PACK EP LF (CUSTOM PROCEDURE TRAY) ×1 IMPLANT
PAD DEFIB RADIO PHYSIO CONN (PAD) ×1 IMPLANT
PATCH CARTO3 (PAD) IMPLANT
SHEATH BAYLIS TRANSSEPTAL 98CM (NEEDLE) IMPLANT
SHEATH CARTO VIZIGO SM CVD (SHEATH) IMPLANT
SHEATH PINNACLE 8F 10CM (SHEATH) IMPLANT
SHEATH PINNACLE 9F 10CM (SHEATH) IMPLANT
SHEATH PROBE COVER 6X72 (BAG) IMPLANT
TUBING SMART ABLATE COOLFLOW (TUBING) IMPLANT

## 2022-12-30 NOTE — CV Procedure (Signed)
    TRANSESOPHAGEAL ECHOCARDIOGRAM   NAME:  SIGURD PUGH   MRN: [id number] DOB:  12-06-1950   ADMIT DATE: 12/30/2022  INDICATIONS:  AFib pre-ablation  PROCEDURE:   Informed consent was obtained prior to the procedure. The risks, benefits and alternatives for the procedure were discussed and the patient comprehended these risks.  Risks include, but are not limited to, cough, sore throat, vomiting, nausea, somnolence, esophageal and stomach trauma or perforation, bleeding, low blood pressure, aspiration, pneumonia, infection, trauma to the teeth and death.    The patient was already under general anesthesia for AF ablation and was on the cath lab table. After a procedural time-out, the transesophageal probe was inserted in the esophagus and stomach without difficulty and multiple views were obtained.    COMPLICATIONS:    There were no immediate complications.  FINDINGS:  LEFT VENTRICLE: EF = 55%. No regional wall motion abnormalities.  RIGHT VENTRICLE: Normal size. Mild HK  + pacing wire  LEFT ATRIUM: Moderately dilated.  LEFT ATRIAL APPENDAGE: Diminutive. No thrombus.   RIGHT ATRIUM: Normal  AORTIC VALVE:  Trileaflet. No AS/AI  MITRAL VALVE:    Normal. No MR  TRICUSPID VALVE: Normal. Trivial TR  PULMONIC VALVE: Grossly normal. No PR  INTERATRIAL SEPTUM: No PFO or ASD. Severe lipomatous hypertrophy  PERICARDIUM: Moderate effusion around RV and apex. No tamponade  DESCENDING AORTA: Mild plaque   Benay Spice 8:23 AM

## 2022-12-30 NOTE — Discharge Instructions (Addendum)

## 2022-12-30 NOTE — Anesthesia Postprocedure Evaluation (Signed)
Anesthesia Post Note  Patient: Nicholas Foley  Procedure(s) Performed: ATRIAL FIBRILLATION ABLATION TRANSESOPHAGEAL ECHOCARDIOGRAM (TEE)     Patient location during evaluation: PACU Anesthesia Type: General Level of consciousness: awake and alert Pain management: pain level controlled Vital Signs Assessment: post-procedure vital signs reviewed and stable Respiratory status: spontaneous breathing, nonlabored ventilation, respiratory function stable and patient connected to nasal cannula oxygen Cardiovascular status: blood pressure returned to baseline and stable Postop Assessment: no apparent nausea or vomiting Anesthetic complications: no   There were no known notable events for this encounter.  Last Vitals:  Vitals:   12/30/22 1400 12/30/22 1430  BP: 114/70 118/71  Pulse: 81 78  Resp: 20 19  Temp:    SpO2: 95% 94%    Last Pain:  Vitals:   12/30/22 1232  TempSrc:   PainSc: 0-No pain                 Santa Lighter

## 2022-12-30 NOTE — H&P (Signed)
Electrophysiology Office Follow up Visit Note:     Date:  12/30/2022    ID:  Nicholas Foley, DOB February 16, 1951, MRN [id number]   PCP:  Nicholas Small, MD          [facility] Cardiologist:  [name], MD  [facility] Electrophysiologist:  Vickie Epley, MD      Interval History:     Nicholas Foley is a 72 y.o. male who presents for a follow up visit.    He previously followed w Dr Nicholas Foley.   He has a history of obesity, chronic systolic HF, COPD, OSA, pers AF, HTN, CRT-P. His HF is nonischemic.   Previously on amiodarone. Previous DCCV. Recurrent AF in September. DCCV 08/13/2022 failed at 150 and 200J.   Today, he reports significantly worsening exercise intolerance in the past 6-8 weeks. Prior to that he was working with a Clinical research associate and exercising at the gym. He had lost 40 lbs. Two weeks ago he was still able to exercise at the gym, although not as intensely. Today he had his wife drive him to his appointment as he becomes short of breath with minimal walking. When he is able, he plans to keep working on his weight loss.   Currently he is taking 2.5 mg metolazone twice a week. He is also on 80 mg Lasix BID. As of 09/21/22 his creatinine was 2.57.   He continues to work full time.   He denies any palpitations, chest pain, or peripheral edema. No lightheadedness, headaches, syncope, orthopnea, or PND.   Presents for PVI today.     Objective      Past Medical History:  Diagnosis Date   Atrial fibrillation with rapid ventricular response ([place]) 08/02/2013   CHF (congestive heart failure) (HCC)     Chronic systolic dysfunction of left ventricle 08/02/2013    BNP greater than 2000    COPD (chronic obstructive pulmonary disease) (HCC)      Moderate by PFTs with response to bronchodilators, May 2014   Erectile dysfunction     Hypertension     Insomnia 08/02/13   Left bundle branch block 08/02/2013   Morbid obesity ([place]) 08/02/2013   OSA (obstructive sleep apnea)       severe OSA with AHI 74/hr now on CPAP at 8cm H2O           Past Surgical History:  Procedure Laterality Date   BIV PACEMAKER INSERTION CRT-P N/A 12/14/2018    Medtronic model [id number] Percepta Quad CRT-P MRI Surescan (serial Number [id number]) device implanted by Dr Nicholas Foley for CHF and LBBB   CARDIOVERSION N/A 08/06/2013    Procedure: CARDIOVERSION;  Surgeon: Nicholas Perla, MD;  Location: [facility] ENDOSCOPY;  Service: Cardiovascular;  Laterality: N/A;  spoke with [name]    CARDIOVERSION N/A 09/03/2013    Procedure: CARDIOVERSION;  Surgeon: [name], MD;  Location: [place];  Service: Cardiovascular;  Laterality: N/A;   CARDIOVERSION N/A 07/21/2021    Procedure: CARDIOVERSION;  Surgeon: Nicholas Dresser, MD;  Location: [facility] ENDOSCOPY;  Service: Cardiovascular;  Laterality: N/A;   CARDIOVERSION N/A 08/13/2022    Procedure: CARDIOVERSION;  Surgeon: Nicholas Hector, MD;  Location: [facility] ENDOSCOPY;  Service: Cardiovascular;  Laterality: N/A;   CARDIOVERSION N/A 09/03/2022    Procedure: CARDIOVERSION;  Surgeon: Nicholas Artist, MD;  Location: [facility] ENDOSCOPY;  Service: Cardiovascular;  Laterality: N/A;   chronic systolic dysfu       NASAL SEPTUM SURGERY  RIGHT/LEFT HEART CATH AND CORONARY ANGIOGRAPHY N/A 12/04/2018    Procedure: RIGHT/LEFT HEART CATH AND CORONARY ANGIOGRAPHY;  Surgeon: Nicholas Crome, MD;  Location: [place] CV LAB;  Service: Cardiovascular;  Laterality: N/A;   TEE WITHOUT CARDIOVERSION N/A 08/06/2013    Procedure: TRANSESOPHAGEAL ECHOCARDIOGRAM (TEE);  Surgeon: Nicholas Perla, MD;  Location: [facility] ENDOSCOPY;  Service: Cardiovascular;  Laterality: N/A;   VASECTOMY          Current Medications: Active Medications      Current Meds  Medication Sig   acetaminophen (TYLENOL) 650 MG CR tablet Take 1,300 mg by mouth in the morning and at bedtime. Arthritis strength   ADVAIR DISKUS 250-50 MCG/DOSE AEPB Inhale 1 puff into the lungs 2 (two) times daily.   amiodarone  (PACERONE) 200 MG tablet Take 1 tablet by mouth twice a day for 1 month then reduce to 1 tablet daily   carvedilol (COREG) 25 MG tablet Take 1 tablet (25 mg total) by mouth 2 (two) times daily with a meal.   cholecalciferol (VITAMIN D3) 25 MCG (1000 UT) tablet Take 1,000 Units by mouth daily.   CIALIS 20 MG tablet Take 20 mg by mouth daily as needed for erectile dysfunction.    cyclobenzaprine (FLEXERIL) 10 MG tablet Take 10 mg by mouth 3 (three) times daily as needed for muscle spasms.   ELIQUIS 5 MG TABS tablet TAKE 1 TABLET(5 MG) BY MOUTH TWICE DAILY   empagliflozin (JARDIANCE) 10 MG TABS tablet Take 1 tablet (10 mg total) by mouth daily before breakfast.   ENTRESTO 97-103 MG TAKE 1 TABLET BY MOUTH TWICE DAILY   fluticasone (FLONASE) 50 MCG/ACT nasal spray Place 1 spray into both nostrils 2 (two) times daily.    furosemide (LASIX) 40 MG tablet TAKE 2 TABLETS(80 MG) BY MOUTH TWICE DAILY   metolazone (ZAROXOLYN) 2.5 MG tablet Take 1 tablet (2.5 mg total) by mouth 2 (two) times a week.   Multiple Vitamins-Minerals (PRESERVISION AREDS PO) Take 1 capsule by mouth 2 (two) times daily.   omeprazole (PRILOSEC OTC) 20 MG tablet Take 20 mg by mouth daily.   potassium chloride SA (KLOR-CON M) 20 MEQ tablet Take 2 tablets (40 mEq total) by mouth daily. (Patient taking differently: Take 20 mEq by mouth See admin instructions. 1 tablet twice daily, except on Mon and Fri take 3 tabs in the morning, 1 tab in the evening)   spironolactone (ALDACTONE) 25 MG tablet Take 1 tablet (25 mg total) by mouth daily.   tamsulosin (FLOMAX) 0.4 MG CAPS capsule Take 0.4 mg by mouth daily.   vitamin B-12 (CYANOCOBALAMIN) 1000 MCG tablet Take 1,000 mcg by mouth daily.   zolpidem (AMBIEN CR) 12.5 MG CR tablet Take 12.5 mg by mouth at bedtime as needed for sleep.        Allergies:   Ace inhibitors and Angiotensin receptor blockers    Social History         Socioeconomic History   Marital status: Married      Spouse  name: Not on file   Number of children: Not on file   Years of education: Not on file   Highest education level: Not on file  Occupational History   Occupation: real estate      Comment: investment  Tobacco Use   Smoking status: Former      Packs/day: 1.50      Years: 43.00      Total pack years: 64.50      Types: Cigarettes,  Cigars      Quit date: 08/02/2012      Years since quitting: 10.1   Smokeless tobacco: Never  Vaping Use   Vaping Use: Never used  Substance and Sexual Activity   Alcohol use: Yes      Alcohol/week: 0.0 standard drinks of alcohol      Comment: once a week; previously 3-4 times per week    Drug use: No   Sexual activity: Yes  Other Topics Concern   Not on file  Social History Narrative    Works as a Engineer, structural for apartments    Lives with wife.  They have one grown daughter    Highest level of education:  college    Social Determinants of Health    Financial Resource Strain: Not on file  Food Insecurity: Not on file  Transportation Needs: Not on file  Physical Activity: Not on file  Stress: Not on file  Social Connections: Not on file      Family History: The patient's family history includes Atrial fibrillation in his mother; COPD in his father; Cerebral aneurysm in his brother; Healthy in his daughter.   ROS:   Please see the history of present illness.    (+) Shortness of breath (+) Fatigue All other systems reviewed and are negative.   EKGs/Labs/Other Studies Reviewed:     The following studies were reviewed today:   04/12/2022 Echo LV 50 RV normal LA mildly dilated   09/23/2022 in clinic device interrogation personally reviewed AF, VS 40 bpm presenting Battery longevity 6.8 years Lead parameters stable Decreased LV lead output to maximize battery longevity Graphs show decreased biV pacing since pers AF in September     EKG:  The ekg ordered today demonstrates AF with biV pacing.   Recent Labs: 08/06/2022: B  Natriuretic Peptide 104.5; Hemoglobin 14.7; Platelets 246 09/21/2022: BUN 62; Creatinine, Ser 2.57; Potassium 4.3; Sodium 136    Recent Lipid Panel Labs (Brief)          Component Value Date/Time    CHOL 184 08/03/2013 0640    TRIG 149 08/03/2013 0640    HDL 50 08/03/2013 0640    CHOLHDL 3.7 08/03/2013 0640    VLDL 30 08/03/2013 0640    LDLCALC 104 (H) 08/03/2013 0640        Physical Exam:     VS:  BP (!) 142/75   Pulse 83   Ht '5\' 11"'$  (1.803 m)   Wt 300 lb (136.1 kg)   SpO2 98%   BMI 41.84 kg/m         Wt Readings from Last 3 Encounters:  09/23/22 300 lb (136.1 kg)  09/02/22 (!) 308 lb 9.6 oz (140 kg)  08/13/22 (!) 303 lb 2.1 oz (137.5 kg)      GEN: Well nourished, well developed in no acute distress. obese HEENT: Normal NECK: No JVD; No carotid bruits LYMPHATICS: No lymphadenopathy CARDIAC: RRR, no murmurs, rubs, gallops RESPIRATORY:  Clear to auscultation without rales, wheezing or rhonchi  ABDOMEN: Soft, non-tender, non-distended MUSCULOSKELETAL:  No edema; No deformity  SKIN: Warm and dry NEUROLOGIC:  Alert and oriented x 3 PSYCHIATRIC:  Normal affect          Assessment ASSESSMENT:     1. Persistent atrial fibrillation ([place])   2. Chronic combined systolic and diastolic heart failure ([place])   3. Biventricular cardiac pacemaker in situ     PLAN:     In order of problems listed above:   #  Persistent AF Previously on amio, then stopped around 2018. Recently restarted after failed DCCV in September. Rhythm control indicated given hx of HF. Continue eliquis.   Discussed treatment options today for their AF including antiarrhythmic drug therapy and ablation. Discussed risks, recovery and likelihood of success. Discussed potential need for repeat ablation procedures and antiarrhythmic drugs after an initial ablation. They wish to proceed with scheduling.   Risk, benefits, and alternatives to EP study and radiofrequency ablation for afib were also  discussed in detail today. These risks include but are not limited to stroke, bleeding, vascular damage, tamponade, perforation, damage to the esophagus, lungs, and other structures, pulmonary vein stenosis, worsening renal function, and death. The patient understands these risk and wishes to proceed.  We will therefore proceed with catheter ablation at the next available time.  Carto, ICE, anesthesia are requested for the procedure.  Will also obtain CT PV protocol prior to the procedure to exclude LAA thrombus and further evaluate atrial anatomy.   To get him some immediate relief, I have scheduled him for a DCCV next week in the EP lab with Dr Curt Bears with a 360J defibrillator. He required higher energy shocks with compression in the past.   #CRT-P in situ Device functioning appropriately. Continue remote monitoring.   #Chronic Systolic HF NYHA class II-III. Warm and dry on exam. Follows w DB. Warm and volume down on exam, ICD metrics Change diuretic to '80mg'$  once a day alternating with twice daily Encouraged him to weigh himself daily      Presents for PVI. Procedure reviewed.   Signed, Lars Mage, MD, [facility], [facility] 12/30/2022 Electrophysiology [facility]

## 2022-12-30 NOTE — Transfer of Care (Signed)
Immediate Anesthesia Transfer of Care Note  Patient: Nicholas Foley  Procedure(s) Performed: ATRIAL FIBRILLATION ABLATION TRANSESOPHAGEAL ECHOCARDIOGRAM (TEE)  Patient Location: PACU  Anesthesia Type:General  Level of Consciousness: awake, alert , and oriented  Airway & Oxygen Therapy: Patient Spontanous Breathing and Patient connected to nasal cannula oxygen  Post-op Assessment: Report given to RN and Post -op Vital signs reviewed and stablePatient c/o sore throat, denies difficulty breathing. Posterior oropharynx assessed with flashlight, no additional bloody secretions removed.   Post vital signs: Reviewed and stable  Last Vitals:  Vitals Value Taken Time  BP 122/65 12/30/22 1030  Temp 36.7 C 12/30/22 1020  Pulse 80 12/30/22 1038  Resp 16 12/30/22 1038  SpO2 95 % 12/30/22 1038  Vitals shown include unvalidated device data.  Last Pain:  Vitals:   12/30/22 1020  TempSrc:   PainSc: 0-No pain         Complications:

## 2022-12-30 NOTE — Progress Notes (Signed)
Patient and family was giving discharge instruction and anticoag. Procedural site looks intact, no signs of hematoma

## 2022-12-30 NOTE — Progress Notes (Signed)
  Echocardiogram Echocardiogram Transesophageal has been performed.  Darlina Sicilian M 12/30/2022, 8:42 AM

## 2022-12-30 NOTE — Anesthesia Procedure Notes (Signed)
Procedure Name: Intubation Date/Time: 12/30/2022 8:00 AM  Performed by: Leonor Liv, CRNAPre-anesthesia Checklist: Patient identified, Emergency Drugs available, Suction available and Patient being monitored Patient Re-evaluated:Patient Re-evaluated prior to induction Oxygen Delivery Method: Circle System Utilized Preoxygenation: Pre-oxygenation with 100% oxygen Induction Type: IV induction Ventilation: Mask ventilation with difficulty and Two handed mask ventilation required Laryngoscope Size: Glidescope and 4 Grade View: Grade I Tube type: Oral Tube size: 7.5 mm Number of attempts: 1 Airway Equipment and Method: Rigid stylet and Video-laryngoscopy Placement Confirmation: ETT inserted through vocal cords under direct vision, positive ETCO2 and breath sounds checked- equal and bilateral Secured at: 23 cm Tube secured with: Tape (left) Dental Injury: Teeth and Oropharynx as per pre-operative assessment  Comments: Hx morbid obesity, facial hair.

## 2022-12-30 NOTE — Anesthesia Preprocedure Evaluation (Addendum)
Anesthesia Evaluation  Patient identified by MRN, date of birth, ID band Patient awake    Reviewed: Allergy & Precautions, NPO status , Patient's Chart, lab work & pertinent test results, reviewed documented beta blocker date and time   Airway Mallampati: IV  TM Distance: >3 FB Neck ROM: Full    Dental  (+) Teeth Intact, Dental Advisory Given, Caps,    Pulmonary sleep apnea and Continuous Positive Airway Pressure Ventilation , COPD, former smoker   Pulmonary exam normal breath sounds clear to auscultation       Cardiovascular hypertension, Pt. on home beta blockers and Pt. on medications (-) angina +CHF  + dysrhythmias (LBBB) Atrial Fibrillation  Rhythm:Regular Rate:Normal     Neuro/Psych  Neuromuscular disease    GI/Hepatic Neg liver ROS,GERD  Medicated,,  Endo/Other    Morbid obesity  Renal/GU Renal disease     Musculoskeletal negative musculoskeletal ROS (+)    Abdominal   Peds  Hematology  (+) Blood dyscrasia (Eliquis)   Anesthesia Other Findings Day of surgery medications reviewed with the patient.  Reproductive/Obstetrics                             Anesthesia Physical Anesthesia Plan  ASA: 3  Anesthesia Plan: General   Post-op Pain Management: Tylenol PO (pre-op)*   Induction: Intravenous  PONV Risk Score and Plan: 2 and Midazolam, Dexamethasone and Ondansetron  Airway Management Planned: Oral ETT and Video Laryngoscope Planned  Additional Equipment:   Intra-op Plan:   Post-operative Plan: Extubation in OR  Informed Consent: I have reviewed the patients History and Physical, chart, labs and discussed the procedure including the risks, benefits and alternatives for the proposed anesthesia with the patient or authorized representative who has indicated his/her understanding and acceptance.     Dental advisory given  Plan Discussed with: CRNA  Anesthesia Plan  Comments:         Anesthesia Quick Evaluation

## 2022-12-30 NOTE — Interval H&P Note (Signed)
History and Physical Interval Note:  12/30/2022 8:11 AM  Nicholas Foley  has presented today for surgery, with the diagnosis of afib.  The various methods of treatment have been discussed with the patient and family. After consideration of risks, benefits and other options for treatment, the patient has consented to  Procedure(s): ATRIAL FIBRILLATION ABLATION (N/A) TRANSESOPHAGEAL ECHOCARDIOGRAM (TEE) (N/A) as a surgical intervention.  The patient's history has been reviewed, patient examined, no change in status, stable for surgery.  I have reviewed the patient's chart and labs.  Questions were answered to the patient's satisfaction.     Gorje Iyer

## 2022-12-31 ENCOUNTER — Encounter (HOSPITAL_COMMUNITY): Payer: Self-pay | Admitting: Cardiology

## 2022-12-31 ENCOUNTER — Encounter: Payer: Self-pay | Admitting: Cardiology

## 2022-12-31 LAB — POCT ACTIVATED CLOTTING TIME
Activated Clotting Time: 363 seconds
Activated Clotting Time: 320 seconds

## 2022-12-31 NOTE — Progress Notes (Signed)
Advanced [facility] Failure Clinic Note   Date:  12/31/2022   ID:  Nicholas Foley, DOB [birth year], MRN [id number]  Location: Home  Provider location: [facility] Type of Visit: Established patient  PCP:  Saintclair Halsted, FNP  Primary Cardiologist:  [name], MD (Inactive) HF Cardiologist: Bensimhon  Chief Complaint: Heart Failure follow-up    HPI:  Nicholas Foley is a 72 y.o. with morbid obesity, chronic systolic heart failure, COPD, OSA, PAF, previously on amio-> stopped 2018,  HTN, LBBB, Medtronic CRT-P, and former smoker.   He has h/o NICM which seems to date back to 2014. Had cardiac cath in 1/20 with no CAD and well compensated filling pressures.    Admitted in 1/20 for CRT-P device in setting of LBBB. Intially he felt a lot better but has gradually declined.   Echo 01/30/20 EF 40-45% (read as 35-40%)  Echo EF 2/22 45% (in some images 45-50%) Limited by septal paradox.   Got COVID again in 1/22 but was a mild case.   Had recurrent AF. Underwent DC-CV 8/22.   Follow up 5/23, NYHA I-II, fluid stable. Echo 04/12/22 showed EF 50-55%, LV dyysynchrony, grade I DD,  RV ok.   Today he returns for work-in visit due to persistent dyspnea, fluid overload and AF.  Developed recurrent AF in early September. Seen in AF clinic. Amio started. Had DC-CV attempt on 08/13/22 which failed x 3. Taking lasix 80 po bid without much benefit. Had metolazone x 1 in recent past and that helped get the fluid down. SOB with any activity. Very fatigued.     Cardiac Studies  - Echo (5/23): EF 50-55%, severe LV dyysynchrony, grade I DD< RV ok,   - Echo (2/22): EF45%  - Echo (3/21): EF 40-45% (read as 35-40%).  - CPX (09/18/19):  Limited by obesity and mild heart failure Peak VO2 15.1  (80% predicted peak VO2) - when corrected to ibw pVO2 27.5 Slope 33 RER 1.02    - L/RHC (1/20) No coronary disease.  RA 13 PA 37/20 (27)  PCWP 14 CO 8.5 CI 3.3    - Echo  (11/20): RV normal EF 45-50%   - Echo (11/19): EF 35-40% Grade II DD      Past Medical History:  Diagnosis Date   Atrial fibrillation with rapid ventricular response ([place]) 08/02/2013   CHF (congestive heart failure) (HCC)    Chronic systolic dysfunction of left ventricle 08/02/2013   BNP greater than 2000    COPD (chronic obstructive pulmonary disease) (HCC)    Moderate by PFTs with response to bronchodilators, May 2014   Erectile dysfunction    Hypertension    Insomnia 08/02/13   Left bundle branch block 08/02/2013   Morbid obesity ([place]) 08/02/2013   OSA (obstructive sleep apnea)    severe OSA with AHI 74/hr now on CPAP at 8cm H2O   Past Surgical History:  Procedure Laterality Date   ATRIAL FIBRILLATION ABLATION N/A 12/30/2022   Procedure: [place];  Surgeon: Vickie Epley, MD;  Location: [place] [facility];  Service: Cardiovascular;  Laterality: N/A;   BIV PACEMAKER INSERTION CRT-P N/A 12/14/2018   Medtronic model [id number] Percepta Quad CRT-P MRI Surescan (serial Number [id number]) device implanted by Dr [name] for CHF and LBBB   CARDIOVERSION N/A 08/06/2013   Procedure: CARDIOVERSION;  Surgeon: Lelon Perla, MD;  Location: [facility];  Service: Cardiovascular;  Laterality: N/A;  spoke with [name]  CARDIOVERSION N/A 09/03/2013   Procedure: CARDIOVERSION;  Surgeon: [name], MD;  Location[facility] ENDOSCOPY;  Service: Cardiovascular;  Laterality: N/A;   CARDIOVERSION N/A 07/21/2021   Procedure: CARDIOVERSION;  Surgeon: Larey Dresser, MD;  Location: [facility] ENDOSCOPY;  Service: Cardiovascular;  Laterality: N/A;   CARDIOVERSION N/A 08/13/2022   Procedure: CARDIOVERSION;  Surgeon: Josue Hector, MD;  Location: [facility] ENDOSCOPY;  Service: Cardiovascular;  Laterality: N/A;   CARDIOVERSION N/A 09/03/2022   Procedure: CARDIOVERSION;  Surgeon: Jolaine Artist, MD;  Location: [facility] ENDOSCOPY;  Service: Cardiovascular;  Laterality: N/A;   CARDIOVERSION N/A 09/28/2022    Procedure: CARDIOVERSION;  Surgeon: Constance Haw, MD;  Location: [place] [facility];  Service: Cardiovascular;  Laterality: N/A;   chronic systolic dysfu     NASAL SEPTUM SURGERY     RIGHT/LEFT HEART CATH AND CORONARY ANGIOGRAPHY N/A 12/04/2018   Procedure: RIGHT/LEFT HEART CATH AND CORONARY ANGIOGRAPHY;  Surgeon: Belva Crome, MD;  Location: [place] [facility];  Service: Cardiovascular;  Laterality: N/A;   TEE WITHOUT CARDIOVERSION N/A 08/06/2013   Procedure: TRANSESOPHAGEAL ECHOCARDIOGRAM (TEE);  Surgeon: Lelon Perla, MD;  Location: [facility] ENDOSCOPY;  Service: Cardiovascular;  Laterality: N/A;   TEE WITHOUT CARDIOVERSION N/A 12/30/2022   Procedure: TRANSESOPHAGEAL ECHOCARDIOGRAM (TEE);  Surgeon: Jolaine Artist, MD;  Location: [place] [facility];  Service: Cardiovascular;  Laterality: N/A;   VASECTOMY       Current Outpatient Medications  Medication Sig Dispense Refill   acetaminophen (TYLENOL) 650 MG CR tablet Take 1,300 mg by mouth in the morning and at bedtime. Arthritis strength     ADVAIR DISKUS 250-50 MCG/DOSE AEPB Inhale 1 puff into the lungs 2 (two) times daily.     amiodarone (PACERONE) 200 MG tablet Take 200 mg by mouth daily.     carvedilol (COREG) 25 MG tablet Take 1 tablet (25 mg total) by mouth 2 (two) times daily with a meal. 180 tablet 3   cholecalciferol (VITAMIN D3) 25 MCG (1000 UT) tablet Take 1,000 Units by mouth daily.     CIALIS 20 MG tablet Take 20 mg by mouth daily as needed for erectile dysfunction.   0   colchicine 0.6 MG tablet Take 1 tablet (0.6 mg total) by mouth daily for 5 days. 5 tablet 0   cyclobenzaprine (FLEXERIL) 10 MG tablet Take 10 mg by mouth 3 (three) times daily.     ELIQUIS 5 MG TABS tablet TAKE 1 TABLET(5 MG) BY MOUTH TWICE DAILY 180 tablet 2   empagliflozin (JARDIANCE) 10 MG TABS tablet Take 1 tablet (10 mg total) by mouth daily before breakfast. 90 tablet 3   ENTRESTO 97-103 MG TAKE 1 TABLET BY MOUTH TWICE DAILY 180 tablet 3    fluticasone (FLONASE) 50 MCG/ACT nasal spray Place 1 spray into both nostrils 2 (two) times daily.   0   furosemide (LASIX) 80 MG tablet Take 1 tablet (80 mg total) by mouth 2 (two) times daily. 360 tablet 3   metolazone (ZAROXOLYN) 2.5 MG tablet Take 1 tablet (2.5 mg total) by mouth once for 1 dose. Take 1 tablet today with extra of 40 meq of Potassium 10 tablet 0   Multiple Vitamins-Minerals (PRESERVISION AREDS PO) Take 1 capsule by mouth 2 (two) times daily.     omeprazole (PRILOSEC OTC) 20 MG tablet Take 20 mg by mouth daily.     pantoprazole (PROTONIX) 40 MG tablet Take 1 tablet (40 mg total) by mouth daily. 45 tablet 0  potassium chloride SA (KLOR-CON M) 20 MEQ tablet Take 20 mEq by mouth 2 (two) times daily.     spironolactone (ALDACTONE) 25 MG tablet Take 1 tablet (25 mg total) by mouth daily. 90 tablet 1   tamsulosin (FLOMAX) 0.4 MG CAPS capsule Take 0.4 mg by mouth daily.     vitamin B-12 (CYANOCOBALAMIN) 1000 MCG tablet Take 1,000 mcg by mouth daily.     zolpidem (AMBIEN CR) 12.5 MG CR tablet Take 12.5 mg by mouth at bedtime as needed for sleep.     No current facility-administered medications for this visit.    Allergies:   Ace inhibitors and Angiotensin receptor blockers   Social History:  The patient  reports that he quit smoking about 10 years ago. His smoking use included cigarettes and cigars. He has a 64.50 pack-year smoking history. He has never used smokeless tobacco. He reports current alcohol use. He reports that he does not use drugs.   Family History:  The patient's family history includes Atrial fibrillation in his mother; COPD in his father; Cerebral aneurysm in his brother; Healthy in his daughter.   ROS:  Please see the history of present illness.   All other systems are personally reviewed and negative.   Recent Labs: 11/18/2022: B Natriuretic Peptide 41.7 12/08/2022: BUN 32; Creatinine, Ser 1.84; Hemoglobin 14.0; Platelets 238; Potassium 4.2; Sodium 137   Personally reviewed   Wt Readings from Last 3 Encounters:  12/30/22 136.1 kg (300 lb)  11/18/22 (!) 137.4 kg (303 lb)  10/11/22 (!) 137 kg (302 lb)    There were no vitals taken for this visit.  Physical Exam General:  Obese male. No resp difficulty HEENT: normal Neck: supple. JVP up. Carotids 2+ bilat; no bruits. No lymphadenopathy or thryomegaly appreciated. Cor: PMI nondisplaced. Regular rate & rhythm. No rubs, gallops or murmurs. Lungs: clear Abdomen: obese soft, nontender, nondistended. No hepatosplenomegaly. No bruits or masses. Good bowel sounds. Extremities: no cyanosis, clubbing, rash, 1+ edema Neuro: alert & orientedx3, cranial nerves grossly intact. moves all 4 extremities w/o difficulty. Affect pleasant   ECG (personally reviewed): AF 90 bpm  Device interrogation (personally reviewed): AF since early September. Optivol up .Personally reviewed  ASSESSMENT AND PLAN: 1. Acute on Chronic Combined Systolic/Diastolic HF - NICM (thought due to LBBB). 12/2018 LHC no coronary disease.  - s/p Medtronic CRT-P - Echo (1/19) EF 35-40%. ECHO 11/19 EF 45-50%.  - Echo (11/20): EF read as 35-40% but with Definity I felt closed to 45% - Echo (3/21): EF read as 35-40% I think 40-45% - Echo EF (2/22): EF 45% (in some images 45-50%) Limited by septal paradox. - CPX test (10/20) suboptimal effort mostly limited to obesity  - Echo (5/23) EF 50-55%, LV dyysynchrony, grade I DD, RV ok.  - Worse NYHA IIIb, in setting of recurrent volume overload Likely due to recurrent AF. - ICD interrogation done personally. Results as above - Continue Lasix 80 mg bid. Add metolazone 2.5 q M/F + kcl 40 with each dose.  - Arrange for DC-CV - Continue carvedilol 25 mg bid. - Continue Entresto 97-103 mg bid. - Continue spiro 25 mg daily. - Continue Jadiance 10 mg daily.   2. PAF  - In the past he was on amiodarone. Stopped ~2017 - Had recurrent AF. Underwent DC-CV 8/22. Followed by AF Clinic.  -  CHA2DS2Vasc is 3. - Continue Eliquis 5 bid. No bleeding. He has not missed any doses. - Failed recent DC-CV - Seen by AF Clinic and  started amio 200 bid - Will plan repeat DC-CV now with amio support - Does not tolerate AF well. Previously not felt to be ablation candidate due to size. He has lost 40 pounds but BMI still high (43). Has f/u with Dr. Quentin Ore next month to discuss.     3. OSA - Continue CPAP.   4. Obesity -  There is no height or weight on file to calculate BMI.  -  Continue weight loss efforts. Recently lost 40 pounds -  Insurance denied [employer].  5. COPD  - No longer smoking.  6. CKD 3a - Last SCr 1.5-1.6 - Continue SGLT2i. - BMET today. - Has been referred to Nephrology - Watch renal function with metolazone.  - Check BMET today and in 2-3 weeks    Nicholas Foley, [place]  12/31/2022 9:34 AM  [facility] [place] [id number] [phone] (office) [phone] (fax)

## 2023-01-03 ENCOUNTER — Encounter (HOSPITAL_COMMUNITY): Payer: Self-pay

## 2023-01-03 ENCOUNTER — Ambulatory Visit (INDEPENDENT_AMBULATORY_CARE_PROVIDER_SITE_OTHER): Payer: 59

## 2023-01-03 ENCOUNTER — Ambulatory Visit (HOSPITAL_COMMUNITY)
Admission: RE | Admit: 2023-01-03 | Discharge: 2023-01-03 | Disposition: A | Discharge status: Home / Self Care | Payer: 59 | Source: Ambulatory Visit | Attending: Family Medicine | Admitting: Family Medicine

## 2023-01-03 VITALS — BP 110/72 | HR 81 | Wt 304.0 lb

## 2023-01-03 DIAGNOSIS — Z950 Presence of cardiac pacemaker: Secondary | ICD-10-CM

## 2023-01-03 DIAGNOSIS — I5042 Chronic combined systolic (congestive) and diastolic (congestive) heart failure: Secondary | ICD-10-CM | POA: Insufficient documentation

## 2023-01-03 DIAGNOSIS — Z79899 Other long term (current) drug therapy: Secondary | ICD-10-CM | POA: Insufficient documentation

## 2023-01-03 DIAGNOSIS — Z87891 Personal history of nicotine dependence: Secondary | ICD-10-CM | POA: Insufficient documentation

## 2023-01-03 DIAGNOSIS — J449 Chronic obstructive pulmonary disease, unspecified: Secondary | ICD-10-CM | POA: Insufficient documentation

## 2023-01-03 DIAGNOSIS — I130 Hypertensive heart and chronic kidney disease with heart failure and stage 1 through stage 4 chronic kidney disease, or unspecified chronic kidney disease: Secondary | ICD-10-CM | POA: Diagnosis not present

## 2023-01-03 DIAGNOSIS — Z7984 Long term (current) use of oral hypoglycemic drugs: Secondary | ICD-10-CM | POA: Diagnosis not present

## 2023-01-03 DIAGNOSIS — Z7901 Long term (current) use of anticoagulants: Secondary | ICD-10-CM | POA: Diagnosis not present

## 2023-01-03 DIAGNOSIS — I428 Other cardiomyopathies: Secondary | ICD-10-CM | POA: Diagnosis not present

## 2023-01-03 DIAGNOSIS — N1831 Chronic kidney disease, stage 3a: Secondary | ICD-10-CM | POA: Diagnosis not present

## 2023-01-03 DIAGNOSIS — I480 Paroxysmal atrial fibrillation: Secondary | ICD-10-CM | POA: Insufficient documentation

## 2023-01-03 DIAGNOSIS — I447 Left bundle-branch block, unspecified: Secondary | ICD-10-CM | POA: Diagnosis not present

## 2023-01-03 DIAGNOSIS — Z6841 Body Mass Index (BMI) 40.0 and over, adult: Secondary | ICD-10-CM | POA: Insufficient documentation

## 2023-01-03 DIAGNOSIS — G4733 Obstructive sleep apnea (adult) (pediatric): Secondary | ICD-10-CM

## 2023-01-03 DIAGNOSIS — N1832 Chronic kidney disease, stage 3b: Secondary | ICD-10-CM

## 2023-01-03 LAB — BASIC METABOLIC PANEL
Anion gap: 8 (ref 5–15)
BUN: 23 mg/dL (ref 8–23)
CO2: 24 mmol/L (ref 22–32)
Calcium: 8.5 mg/dL — ABNORMAL LOW (ref 8.9–10.3)
Chloride: 105 mmol/L (ref 98–111)
Creatinine, Ser: 1.75 mg/dL — ABNORMAL HIGH (ref 0.61–1.24)
GFR, Estimated: 42 mL/min — ABNORMAL LOW (ref >60)
Glucose, Bld: 98 mg/dL (ref 70–99)
Potassium: 3.8 mmol/L (ref 3.5–5.1)
Sodium: 137 mmol/L (ref 135–145)

## 2023-01-03 NOTE — Patient Instructions (Signed)
Medication Changes:  Continue current regimen.   TODAY ONLY: Take metolazone 2.69m and EXTRA 495m of Potassium chloride.   Lab Work:  Labs done today, your results will be available in MyChart, we will contact you for abnormal readings.  Special Instructions // Education:  Do the following things EVERYDAY: Weigh yourself in the morning before breakfast. Write it down and keep it in a log. Take your medicines as prescribed Eat low salt foods--Limit salt (sodium) to 2000 mg per day.  Stay as active as you can everyday Limit all fluids for the day to less than 2 liters  Follow-Up in: Please call our clinic in MAY to [place]     At the [facility] and your health needs are our priority. We have a designated team specialized in the treatment of Heart Failure. This Care Team includes your primary Heart Failure Specialized Cardiologist (physician), Advanced Practice Providers (APPs- Physician Assistants and Nurse Practitioners), and Pharmacist who all work together to provide you with the care you need, when you need it.   You may see any of the following providers on your designated Care Team at your next follow up:  Dr. DaGlori Bickersr. DaLoralie Champagner. AdRoxana HiresNP BrLyda JesterPAUtaheGuam Surgicenter LLCiMeire GrovePAUtahlForestine NaNP LaAudry RilesPharmD   Please be sure to bring in all your medications bottles to every appointment.   Need to Contact UsK[place] If you have any questions or concerns before your next appointment please send usK[place] message through myWiley Fordr call our office at [phone]   TO LEAVE A MESSAGE FOR THE NURSE SELECT OPTION 2, PLEASE LEAVE A MESSAGE INCLUDING: YOUR NAME DATE OF BIRTH CALL BACK NUMBER REASON FOR CALL**this is important as we prioritize the call backs  YOU WILL RECEIVE A CALL BACK THE SAME DAY AS LONG AS YOU CALL BEFORE 4:00  PM

## 2023-01-03 NOTE — Progress Notes (Signed)
EPIC Encounter for ICM Monitoring  Patient Name: Nicholas Foley is a 72 y.o. male Date: 01/03/2023 Primary Care Physican: Saintclair Halsted, [place] Primary Cardiologist: Smith/Bensimhon Electrophysiologist: Quentin Ore Nephrologist: [facility]: Dr Joylene Grapes Bi-V Pacing:  96.8% 03/17/2022 Weight: 300 lbs  10/04/2022 Weight: 299 lbs (299-302 baseline) 11/26/2022 Weight: 300 lbs (has not weighed last 2 days) 12/05/2022 Weight: 297.22 lbs 12/06/2022 Weight: 299.5 lbs 01/03/2023 Weight: 299 lbs   Clinical Status (29-Nov-2022 to 06-Dec-2022) Time in AT/AF  0.0 hr/day (0.0%)         Spoke with patient and heart failure questions reviewed.  Transmission results reviewed.  Pt had ablation on 2/8 but it was a little more difficult than expected and had to be intubated.  He has a headache, nausea and soret throat from the procedure but from heart standpoint he is doing fine.           Optivol thoracic impedance suggesting possible fluid accumulation starting 2/5.    Prescribed: Furosemide 40 mg take 2 tablets (80 mg total) twice a day Potassium 20 mEq take 1 tablet twice a day  Metolazone 2.5 mg take 1 tablet for 1 dose with 40 mEq of Potassium   Labs: 11/18/2022 Creatinine 2.06, BUN 33, Potassium 4.4, Sodium 137 10/19/2022 Creatinine 2.20, BUN 43, Potassium 4.5, Sodium 138, GFR 32 09/28/2022 Creatinine 2.19, BUN 51, Potassium 4.2, Sodium 135, GFR 32 09/23/2022 Creatinine 2.65, BUN 64, Potassium 5.3, Sodium 138, GFR 25  09/21/2022 Creatinine 2.57, BUN 62, Potassium 4.3, Sodium 136, GFR 26  09/02/2022 Creatinine 1.53, BUN 18, Potassium 4.1, Sodium 139, GFR 49 08/09/2022 Creatinine 1.68, BUN 33, Potassium 4.2, Sodium 139, GFR 44  08/06/2022 Creatinine 1.51, BUN 18, Potassium 3.1, Sodium 139, GFR 50  07/15/2021 Creatinine 1.08, BUN 17, Potassium 3.7, Sodium 138. GFR >60 05/04/2021 Creatinine 1.25, BUN 20, Potassium 4.4, Sodium 136, GFR 63 01/07/2021 Creatinine 1.20, BUN 17, Potassium 3.7,  Sodium 139, GFR >60 A complete set of results can be found in Results Review.   Recommendations:  HF clinic advised patient to take 1 dose of Metolazone today with extra Potassium for fluid accumulation.   Follow-up plan: ICM clinic phone appointment on 01/05/2023 to recheck fluid levels.   91 day device clinic remote transmission 01/19/2023.     EP/Cardiology Office Visits:     04/20/2023 with Dr Quentin Ore.   Recall 03/14/2023 with Dr [name].   Copy of ICM check sent to Dr Quentin Ore.    3 month ICM trend: 01/03/2023.    12-14 Month ICM trend:     Rosalene Billings, RN 01/03/2023 4:51 PM

## 2023-01-05 ENCOUNTER — Ambulatory Visit: Payer: 59 | Attending: Cardiology

## 2023-01-05 DIAGNOSIS — I5042 Chronic combined systolic (congestive) and diastolic (congestive) heart failure: Secondary | ICD-10-CM

## 2023-01-05 DIAGNOSIS — Z950 Presence of cardiac pacemaker: Secondary | ICD-10-CM

## 2023-01-05 LAB — ECHO TEE
Height: 71 in
Weight: 4800 oz

## 2023-01-05 NOTE — Progress Notes (Signed)
EPIC Encounter for ICM Monitoring  Patient Name: Nicholas Foley is a 72 y.o. male Date: 01/05/2023 Primary Care Physican: Saintclair Halsted, [place] Primary Cardiologist: Smith/Bensimhon Electrophysiologist: Quentin Ore Nephrologist: [facility]: Dr Joylene Grapes Bi-V Pacing:  96.8% 03/17/2022 Weight: 300 lbs  10/04/2022 Weight: 299 lbs (299-302 baseline) 11/26/2022 Weight: 300 lbs (has not weighed last 2 days) 12/05/2022 Weight: 297.22 lbs 12/06/2022 Weight: 299.5 lbs 01/03/2023 Weight: 299 lbs 01/05/2023 Weight: 297 lbs   Clinical Status (29-Nov-2022 to 06-Dec-2022) Time in AT/AF  0.0 hr/day (0.0%)         Spoke with patient and heart failure questions reviewed.  Transmission results reviewed.  Pt asymptomatic for fluid accumulation and feeling well after taking Metolazone.         Optivol thoracic impedance suggesting fluid levels changed to slightly dry as result of Metolazone dosage.     Prescribed: Furosemide 40 mg take 2 tablets (80 mg total) twice a day Potassium 20 mEq take 1 tablet twice a day  Metolazone 2.5 mg take 1 tablet for 1 dose with 40 mEq of Potassium   Labs: 01/03/2023 Creatinine 1.75, BUN 23, Potassium 3.8, Sodium 137, GFR 42  12/08/2022 Creatinine 1.84, BUN 32, Potassium 4.2, Sodium 137  11/18/2022 Creatinine 2.06, BUN 33, Potassium 4.4, Sodium 137 10/19/2022 Creatinine 2.20, BUN 43, Potassium 4.5, Sodium 138, GFR 32 09/28/2022 Creatinine 2.19, BUN 51, Potassium 4.2, Sodium 135, GFR 32 A complete set of results can be found in Results Review.   Recommendations:  No changes and encouraged to call if experiencing any fluid symptoms.   Follow-up plan: ICM clinic phone appointment on 02/07/2023.   91 day device clinic remote transmission 01/19/2023.     EP/Cardiology Office Visits:   04/20/2023 with Dr Quentin Ore.   Recall 03/14/2023 with Dr [name].   Copy of ICM check sent to Dr Quentin Ore.    3 month ICM trend: 01/05/2023.    12-14 Month ICM trend:     Rosalene Billings, RN 01/05/2023 7:36 AM

## 2023-01-06 ENCOUNTER — Encounter: Payer: Self-pay | Admitting: Cardiology

## 2023-01-07 ENCOUNTER — Telehealth: Payer: Self-pay | Admitting: Cardiology

## 2023-01-07 MED ORDER — ENTRESTO 49-51 MG PO TABS: 1.0000 | ORAL_TABLET | Freq: Two times a day (BID) | ORAL | 3 refills | Status: DC

## 2023-01-07 NOTE — Telephone Encounter (Signed)
See Phone note

## 2023-01-07 NOTE — Telephone Encounter (Signed)
Spoke with Dawn at [facility] who states that patient's PCP is not in the office this afternoon. She will leave a message for her to call us back on Monday if there are any questions that she had for [place].

## 2023-01-07 NOTE — Telephone Encounter (Signed)
Spoke with the patient and gave recommendations per Allena Katz, NP.  Patient verbalized understanding.

## 2023-01-07 NOTE — Telephone Encounter (Signed)
Spoke with the patient who states that at his PCP visit yesterday his BP was 80's/50's. He states that he also had lab work done which showed that his kidney function has worsened. He states that his PCP told him last night to cut his Lasix in half this morning and to not take spironolactone. He states that BP today was 108/68 and 110/55. He reports that he has been feeling tired and does get dizzy. He also reports a headache. He states that when he is sitting still he was fine but anytime he moves he becomes symptomatic. He states that dizziness has improved. Does not feel like he is going to pass out. He is staying hydrated. He has a follow up appointment with his PCP on Monday and they are going to repeat labs. He states that PCP's office would like for [place] to give them a call to see if any other medications need to be adjusted.

## 2023-01-07 NOTE — Telephone Encounter (Signed)
Caller stated they faxed patient's latest lab results for Dr. Mardene Speak review.

## 2023-01-07 NOTE — Telephone Encounter (Signed)
Spoke with Windy Carina, FNP who saw the patient yesterday. She would like some direction on his medications. She states that overall the patient looked good yesterday and was not short of breath. He did complain of fatigue. She states his rhythm was regular. His blood work showed creatinine of 2.54, GFR of 27, sodium 135, and potassium 5.1. He is getting repeat labs on Monday.  Patient recently seen by CHF clinic and was doing well.

## 2023-01-07 NOTE — Telephone Encounter (Signed)
Pt c/o BP issue: STAT if pt c/o blurred vision, one-sided weakness or slurred speech  1. What are your last 5 BP readings?   110/52 80/? (at office yesterday)  2. Are you having any other symptoms (ex. Dizziness, headache, blurred vision, passed out)?   Headache and dizziness  3. What is your BP issue?    Patient stated his BP has been dropping very low.  Patient stated his PCP suggested patient take half his furosemide (LASIX) 80 MG tablet (Expired) and stop the spironolactone (ALDACTONE) 25 MG tablet.

## 2023-01-07 NOTE — Telephone Encounter (Signed)
Caller returned RN's call.

## 2023-01-10 ENCOUNTER — Encounter: Payer: Self-pay | Admitting: Cardiology

## 2023-01-10 ENCOUNTER — Telehealth: Payer: Self-pay

## 2023-01-10 NOTE — Telephone Encounter (Signed)
Returned call to patient as requested.  He reports his PCP was concerned at this visit last week that his BP was very low, he volume level was down and as well as kidney function was worse.  Pt received recommendations from Gildardo Pounds, NP (see 2/16 phone note) and was his symptoms were more than likely related to the Metolazone earlier last week.  Pt is feeling better today and BP has improved.  His symptoms have resolved and has no further complaints today.  No changes and next ICM remote transmission due 3/18.  Advised to call back for any changes in condition.

## 2023-01-11 ENCOUNTER — Encounter: Payer: Self-pay | Admitting: Cardiology

## 2023-01-19 ENCOUNTER — Ambulatory Visit: Payer: 59

## 2023-01-19 DIAGNOSIS — I428 Other cardiomyopathies: Secondary | ICD-10-CM

## 2023-01-19 LAB — CUP PACEART REMOTE DEVICE CHECK
Battery Remaining Longevity: 58 mo
Battery Voltage: 2.96 V
Brady Statistic AP VP Percent: 0.28 %
Brady Statistic AP VS Percent: 0.02 %
Brady Statistic AS VP Percent: 97.62 %
Brady Statistic AS VS Percent: 2.08 %
Brady Statistic RA Percent Paced: 0.38 %
Brady Statistic RV Percent Paced: 90.68 %
Date Time Interrogation Session: 20240227182824
Implantable Lead Connection Status: 753985
Implantable Lead Connection Status: 753985
Implantable Lead Connection Status: 753985
Implantable Lead Implant Date: 20200123
Implantable Lead Implant Date: 20200123
Implantable Lead Implant Date: 20200123
Implantable Lead Location: 753858
Implantable Lead Location: 753859
Implantable Lead Location: 753860
Implantable Lead Model: 4598
Implantable Lead Model: 5076
Implantable Lead Model: 5076
Implantable Pulse Generator Implant Date: 20200123
Lead Channel Impedance Value: 1102 Ohm
Lead Channel Impedance Value: 1159 Ohm
Lead Channel Impedance Value: 1159 Ohm
Lead Channel Impedance Value: 1178 Ohm
Lead Channel Impedance Value: 1482 Ohm
Lead Channel Impedance Value: 1539 Ohm
Lead Channel Impedance Value: 361 Ohm
Lead Channel Impedance Value: 418 Ohm
Lead Channel Impedance Value: 475 Ohm
Lead Channel Impedance Value: 475 Ohm
Lead Channel Impedance Value: 494 Ohm
Lead Channel Impedance Value: 798 Ohm
Lead Channel Impedance Value: 855 Ohm
Lead Channel Impedance Value: 874 Ohm
Lead Channel Pacing Threshold Amplitude: 0.5 V
Lead Channel Pacing Threshold Amplitude: 0.75 V
Lead Channel Pacing Threshold Amplitude: 2.75 V
Lead Channel Pacing Threshold Pulse Width: 0.4 ms
Lead Channel Pacing Threshold Pulse Width: 0.4 ms
Lead Channel Pacing Threshold Pulse Width: 0.8 ms
Lead Channel Sensing Intrinsic Amplitude: 1.125 mV
Lead Channel Sensing Intrinsic Amplitude: 1.125 mV
Lead Channel Sensing Intrinsic Amplitude: 5.25 mV
Lead Channel Sensing Intrinsic Amplitude: 5.25 mV
Lead Channel Setting Pacing Amplitude: 1.5 V
Lead Channel Setting Pacing Amplitude: 2.5 V
Lead Channel Setting Pacing Amplitude: 3.75 V
Lead Channel Setting Pacing Pulse Width: 0.4 ms
Lead Channel Setting Pacing Pulse Width: 0.8 ms
Lead Channel Setting Sensing Sensitivity: 2 mV
Zone Setting Status: 755011
Zone Setting Status: 755011

## 2023-01-27 ENCOUNTER — Encounter (HOSPITAL_COMMUNITY): Payer: Self-pay | Admitting: Physician Assistant

## 2023-01-27 ENCOUNTER — Ambulatory Visit (HOSPITAL_COMMUNITY)
Admission: RE | Admit: 2023-01-27 | Discharge: 2023-01-27 | Disposition: A | Discharge status: Home / Self Care | Payer: 59 | Source: Ambulatory Visit | Attending: Physician Assistant | Admitting: Physician Assistant

## 2023-01-27 VITALS — BP 84/64 | HR 74 | Ht 71.0 in | Wt 303.2 lb

## 2023-01-27 DIAGNOSIS — J449 Chronic obstructive pulmonary disease, unspecified: Secondary | ICD-10-CM | POA: Diagnosis not present

## 2023-01-27 DIAGNOSIS — Z713 Dietary counseling and surveillance: Secondary | ICD-10-CM | POA: Insufficient documentation

## 2023-01-27 DIAGNOSIS — G4733 Obstructive sleep apnea (adult) (pediatric): Secondary | ICD-10-CM | POA: Insufficient documentation

## 2023-01-27 DIAGNOSIS — Z6841 Body Mass Index (BMI) 40.0 and over, adult: Secondary | ICD-10-CM | POA: Diagnosis not present

## 2023-01-27 DIAGNOSIS — I4819 Other persistent atrial fibrillation: Secondary | ICD-10-CM | POA: Diagnosis not present

## 2023-01-27 DIAGNOSIS — Z87891 Personal history of nicotine dependence: Secondary | ICD-10-CM | POA: Insufficient documentation

## 2023-01-27 DIAGNOSIS — D6869 Other thrombophilia: Secondary | ICD-10-CM | POA: Insufficient documentation

## 2023-01-27 DIAGNOSIS — Z7951 Long term (current) use of inhaled steroids: Secondary | ICD-10-CM | POA: Diagnosis not present

## 2023-01-27 DIAGNOSIS — Z7901 Long term (current) use of anticoagulants: Secondary | ICD-10-CM | POA: Insufficient documentation

## 2023-01-27 DIAGNOSIS — Z8616 Personal history of COVID-19: Secondary | ICD-10-CM | POA: Diagnosis not present

## 2023-01-27 DIAGNOSIS — I428 Other cardiomyopathies: Secondary | ICD-10-CM | POA: Diagnosis not present

## 2023-01-27 DIAGNOSIS — R031 Nonspecific low blood-pressure reading: Secondary | ICD-10-CM | POA: Diagnosis not present

## 2023-01-27 DIAGNOSIS — I5042 Chronic combined systolic (congestive) and diastolic (congestive) heart failure: Secondary | ICD-10-CM | POA: Diagnosis not present

## 2023-01-27 DIAGNOSIS — Z79899 Other long term (current) drug therapy: Secondary | ICD-10-CM | POA: Diagnosis not present

## 2023-01-27 DIAGNOSIS — E669 Obesity, unspecified: Secondary | ICD-10-CM | POA: Insufficient documentation

## 2023-01-27 DIAGNOSIS — I110 Hypertensive heart disease with heart failure: Secondary | ICD-10-CM | POA: Insufficient documentation

## 2023-01-27 NOTE — Progress Notes (Signed)
Primary Care Physician: Saintclair Halsted, FNP Referring Physician: Device clinic EP: Dr. Quentin Ore Cardiologist: Dr. Faythe Casa AHF- Dr. [name] is a 72 y.o. male with a h/o  of NICM, Chronic CHF (systolic=> EF [id number], [id number]) ), COPD (prior smoker), OSA (w/CPAP), AFib, HTN, LBBB, CRT-P, morbid obesity, prior amiodarone therapy (DC 2018), and prior smoker. Suffered COVID-19 infection in December 2020.  He is in the afib clinic per the device clinic, the pt has been noted to be in afib since 8/18. He had recently been on an an antibiotic and  prednisone taper for  f/u long haul Covid  and a "spot on his lung." He feels he has retained fluid during this time. He has been instructed to take an extra lasix. Had previously been treated on Amiodarone, per Epic,  but pt does not remember this drug. EKG today shows  afib at 88 bpm. He feels very fatigued with afib. Recommendation from the long Covid clinic at [facility] was exercise and he has been able to regain a lot of his strength through regular exercsie program and  working with a Physiological scientist. HE was feeling really good until recent with return of afib. He is anxious to get back to exercise and get his energy back. No missed anticoagulation for a CHA2DS2VASc score of 4.   F/u in afib clinic, 07/28/21. He had successful cardioversion and remains in SR today. He has felt really good until yesterday and today. His fluid status is stable, actually done a few lbs. He states with long Covid, at times will have days he does not feel as well.   Follow up in the AF clinic 08/09/22. Patient reports that despite the metolazone dose and his weight decreasing he is still very symptomatic with his afib. He has dyspnea with exertion and lightheadedness. No bleeding issues on anticoagulation.   Follow up in the AF clinic 01/27/23. Patient has done well since his ablation from an afib standpoint. He has not had any episodes of afib. He denies chest pain,  swallowing pain, or groin issues. He has noted a low BP recently, his [name] was decreased. He has also noted a 5 lbs weight gain since decreasing his lasix which has been stable for several days.   Today, he denies symptoms of chest pain, orthopnea, PND, lower extremity edema, dizziness, presyncope, syncope, or neurologic sequela. The patient is tolerating medications without difficulties and is otherwise without complaint today.   Past Medical History:  Diagnosis Date   Atrial fibrillation with rapid ventricular response ([place]) 08/02/2013   CHF (congestive heart failure) (HCC)    Chronic systolic dysfunction of left ventricle 08/02/2013   BNP greater than 2000    COPD (chronic obstructive pulmonary disease) (HCC)    Moderate by PFTs with response to bronchodilators, May 2014   Erectile dysfunction    Hypertension    Insomnia 08/02/13   Left bundle branch block 08/02/2013   Morbid obesity ([place]) 08/02/2013   OSA (obstructive sleep apnea)    severe OSA with AHI 74/hr now on CPAP at 8cm H2O   Past Surgical History:  Procedure Laterality Date   ATRIAL FIBRILLATION ABLATION N/A 12/30/2022   Procedure: [place];  Surgeon: Vickie Epley, MD;  Location: [place] CV LAB;  Service: Cardiovascular;  Laterality: N/A;   BIV PACEMAKER INSERTION CRT-P N/A 12/14/2018   Medtronic model [id number] Percepta Quad CRT-P MRI Surescan (serial Number [id number]) device implanted by Dr [name] for CHF  and LBBB   CARDIOVERSION N/A 08/06/2013   Procedure: CARDIOVERSION;  Surgeon: Lelon Perla, MD;  Location: [place];  Service: Cardiovascular;  Laterality: N/A;  spoke with [name]    CARDIOVERSION N/A 09/03/2013   Procedure: CARDIOVERSION;  Surgeon: [name], MD;  Location: [place];  Service: Cardiovascular;  Laterality: N/A;   CARDIOVERSION N/A 07/21/2021   Procedure: CARDIOVERSION;  Surgeon: Larey Dresser, MD;  Location: [facility] ENDOSCOPY;  Service: Cardiovascular;   Laterality: N/A;   CARDIOVERSION N/A 08/13/2022   Procedure: CARDIOVERSION;  Surgeon: Josue Hector, MD;  Location: [facility] ENDOSCOPY;  Service: Cardiovascular;  Laterality: N/A;   CARDIOVERSION N/A 09/03/2022   Procedure: CARDIOVERSION;  Surgeon: Jolaine Artist, MD;  Location: [facility] ENDOSCOPY;  Service: Cardiovascular;  Laterality: N/A;   CARDIOVERSION N/A 09/28/2022   Procedure: CARDIOVERSION;  Surgeon: Constance Haw, MD;  Location: [place] CV LAB;  Service: Cardiovascular;  Laterality: N/A;   chronic systolic dysfu     NASAL SEPTUM SURGERY     RIGHT/LEFT HEART CATH AND CORONARY ANGIOGRAPHY N/A 12/04/2018   Procedure: RIGHT/LEFT HEART CATH AND CORONARY ANGIOGRAPHY;  Surgeon: Belva Crome, MD;  Location: [place] CV LAB;  Service: Cardiovascular;  Laterality: N/A;   TEE WITHOUT CARDIOVERSION N/A 08/06/2013   Procedure: TRANSESOPHAGEAL ECHOCARDIOGRAM (TEE);  Surgeon: Lelon Perla, MD;  Location: [facility] ENDOSCOPY;  Service: Cardiovascular;  Laterality: N/A;   TEE WITHOUT CARDIOVERSION N/A 12/30/2022   Procedure: TRANSESOPHAGEAL ECHOCARDIOGRAM (TEE);  Surgeon: Jolaine Artist, MD;  Location: [place] CV LAB;  Service: Cardiovascular;  Laterality: N/A;   VASECTOMY      Current Outpatient Medications  Medication Sig Dispense Refill   acetaminophen (TYLENOL) 650 MG CR tablet Take 1,300 mg by mouth in the morning and at bedtime. Arthritis strength     ADVAIR DISKUS 250-50 MCG/DOSE AEPB Inhale 1 puff into the lungs 2 (two) times daily.     amiodarone (PACERONE) 200 MG tablet Take 200 mg by mouth daily.     carvedilol (COREG) 25 MG tablet Take 1 tablet (25 mg total) by mouth 2 (two) times daily with a meal. 180 tablet 3   cholecalciferol (VITAMIN D3) 25 MCG (1000 UT) tablet Take 1,000 Units by mouth daily.     CIALIS 20 MG tablet Take 20 mg by mouth daily as needed for erectile dysfunction.   0   cyclobenzaprine (FLEXERIL) 10 MG tablet Take 10 mg by mouth 3 (three) times daily.      ELIQUIS 5 MG TABS tablet TAKE 1 TABLET(5 MG) BY MOUTH TWICE DAILY 180 tablet 2   empagliflozin (JARDIANCE) 10 MG TABS tablet Take 1 tablet (10 mg total) by mouth daily before breakfast. 90 tablet 3   fluticasone (FLONASE) 50 MCG/ACT nasal spray Place 1 spray into both nostrils 2 (two) times daily.   0   furosemide (LASIX) 80 MG tablet Take 1 tablet (80 mg total) by mouth 2 (two) times daily. 360 tablet 3   metolazone (ZAROXOLYN) 2.5 MG tablet Take 1 tablet (2.5 mg total) by mouth once for 1 dose. Take 1 tablet today with extra of 40 meq of Potassium 10 tablet 0   Multiple Vitamins-Minerals (PRESERVISION AREDS PO) Take 1 capsule by mouth 2 (two) times daily.     omeprazole (PRILOSEC OTC) 20 MG tablet Take 20 mg by mouth daily.     pantoprazole (PROTONIX) 40 MG tablet Take 1 tablet (40 mg total) by mouth daily. 45 tablet 0   potassium chloride  SA (KLOR-CON M) 20 MEQ tablet Take 20 mEq by mouth 2 (two) times daily.     sacubitril-valsartan (ENTRESTO) 49-51 MG Take 1 tablet by mouth 2 (two) times daily. 180 tablet 3   spironolactone (ALDACTONE) 25 MG tablet Take 1 tablet (25 mg total) by mouth daily. 90 tablet 1   tamsulosin (FLOMAX) 0.4 MG CAPS capsule Take 0.4 mg by mouth daily.     vitamin B-12 (CYANOCOBALAMIN) 1000 MCG tablet Take 1,000 mcg by mouth daily.     zolpidem (AMBIEN CR) 12.5 MG CR tablet Take 12.5 mg by mouth at bedtime as needed for sleep.     colchicine 0.6 MG tablet Take 1 tablet (0.6 mg total) by mouth daily for 5 days. 5 tablet 0   No current facility-administered medications for this encounter.    Allergies  Allergen Reactions   Ace Inhibitors Cough   Angiotensin Receptor Blockers Cough    Social History   Socioeconomic History   Marital status: Married    Spouse name: Not on file   Number of children: Not on file   Years of education: Not on file   Highest education level: Not on file  Occupational History   Occupation: real estate    Comment: investment  Tobacco  Use   Smoking status: Former    Packs/day: 1.50    Years: 43.00    Total pack years: 64.50    Types: Cigarettes, Cigars    Quit date: 08/02/2012    Years since quitting: 10.4   Smokeless tobacco: Never   Tobacco comments:    Former smoker 01/27/23  Vaping Use   Vaping Use: Never used  Substance and Sexual Activity   Alcohol use: Yes    Alcohol/week: 0.0 standard drinks of alcohol    Comment: once a week; previously 3-4 times per week    Drug use: No   Sexual activity: Yes  Other Topics Concern   Not on file  Social History Narrative   Works as a Engineer, structural for apartments   Lives with wife.  They have one grown daughter   Highest level of education:  college   Social Determinants of Health   Financial Resource Strain: Not on file  Food Insecurity: Not on file  Transportation Needs: Not on file  Physical Activity: Not on file  Stress: Not on file  Social Connections: Not on file  Intimate Partner Violence: Not on file    Family History  Problem Relation Age of Onset   Atrial fibrillation Mother    COPD Father    Cerebral aneurysm Brother    Healthy Daughter     ROS- All systems are reviewed and negative except as per the HPI above  Physical Exam: Vitals:   01/27/23 1002  BP: (!) 84/64  Pulse: 74  Weight: (!) 137.5 kg  Height: '5\' 11"'$  (1.803 m)     Wt Readings from Last 3 Encounters:  01/27/23 (!) 137.5 kg  01/03/23 (!) 137.9 kg  12/30/22 136.1 kg    Labs: Lab Results  Component Value Date   NA 137 01/03/2023   K 3.8 01/03/2023   CL 105 01/03/2023   CO2 24 01/03/2023   GLUCOSE 98 01/03/2023   BUN 23 01/03/2023   CREATININE 1.75 (H) 01/03/2023   CALCIUM 8.5 (L) 01/03/2023   MG 2.1 01/15/2020   Lab Results  Component Value Date   INR 1.2 11/17/2019   Lab Results  Component Value Date   CHOL 184 08/03/2013  HDL 50 08/03/2013   LDLCALC 104 (H) 08/03/2013   TRIG 149 08/03/2013     GEN- The patient is a well appearing obese  male, alert and oriented x 3 today.   HEENT-head normocephalic, atraumatic, sclera clear, conjunctiva pink, hearing intact, trachea midline. Lungs- Clear to ausculation bilaterally, normal work of breathing Heart- Regular rate and rhythm, no murmurs, rubs or gallops  GI- soft, NT, ND, + BS Extremities- no clubbing, cyanosis, or edema MS- no significant deformity or atrophy Skin- no rash or lesion Psych- euthymic mood, full affect Neuro- strength and sensation are intact   EKG today demonstrates A sense V paced rhythm Vent. rate 74 BPM PR interval 136 ms QRS duration 138 ms QT/QTcB 448/497 ms   CHA2DS2-VASc Score = 4  The patient's score is based upon: CHF History: 1 HTN History: 1 Diabetes History: 0 Stroke History: 0 Vascular Disease History: 1 Age Score: 1 Gender Score: 0       ASSESSMENT AND PLAN: 1. Persistent Atrial Fibrillation (ICD10:  I48.19) The patient's CHA2DS2-VASc score is 4, indicating a 4.8% annual risk of stroke.   S/p afib ablation 12/30/22 Patient appears to be maintaining SR.  Continue amiodarone 200 mg daily Continue carvedilol 25 mg BID Continue Eliquis 5 mg BID with no missed doses for 3 months post ablation.   2. Secondary Hypercoagulable State (ICD10:  D68.69) The patient is at significant risk for stroke/thromboembolism based upon his CHA2DS2-VASc Score of 4.  Continue Apixaban (Eliquis).   3. Chronic combined systolic and diastolic heart failure  Followed in the [facility] Patient up about 5 lbs but has remained there for several days.  BP is low today, not symptomatic at this time. Will reach out to [facility] team about possibly reducing another of his medications.   4. Obesity Body mass index is 42.29 kg/m. Lifestyle modification was discussed and encouraged including regular physical activity and weight reduction. Patient doing well with diet and exercise  5. OSA Encouraged compliance with CPAP therapy.    Follow up with Dr Quentin Ore as  scheduled.    [facility] [address] [place], [place] [address] [phone]

## 2023-01-28 ENCOUNTER — Telehealth (HOSPITAL_COMMUNITY): Payer: Self-pay

## 2023-01-28 MED ORDER — ENTRESTO 24-26 MG PO TABS: 1.0000 | ORAL_TABLET | Freq: Two times a day (BID) | ORAL | 11 refills | Status: DC

## 2023-01-28 NOTE — Telephone Encounter (Signed)
Per verbal by Velora Mediate patient's Nicholas Foley has been decreased to 24/26. Pt's med list has been changed and up dated.  Spoke to patient's wife and she's aware, agreeable, and verbalized understanding

## 2023-02-07 ENCOUNTER — Ambulatory Visit: Payer: 59 | Attending: Cardiology

## 2023-02-07 DIAGNOSIS — I5042 Chronic combined systolic (congestive) and diastolic (congestive) heart failure: Secondary | ICD-10-CM | POA: Diagnosis not present

## 2023-02-07 DIAGNOSIS — Z950 Presence of cardiac pacemaker: Secondary | ICD-10-CM

## 2023-02-09 NOTE — Progress Notes (Signed)
EPIC Encounter for ICM Monitoring  Patient Name: Nicholas Foley is a 72 y.o. male Date: 02/09/2023 Primary Care Physican: Saintclair Halsted, FNP Primary Cardiologist: Smith/Bensimhon Electrophysiologist: Quentin Ore Nephrologist: [facility]: Dr Joylene Grapes Bi-V Pacing:  96.0% 01/03/2023 Weight: 299 lbs 01/05/2023 Weight: 297 lbs 02/09/2023 Weight: 296 lbs    Clinical Status (18-Jan-2023 to 06-Feb-2023) Time in AT/AF  0.0 hr/day (0.0%)         Spoke with patient and heart failure questions reviewed.  Transmission results reviewed.  Pt asymptomatic for fluid accumulation.  He has been feeling more energetic since the ablation and feels like that helped a lot.          Optivol thoracic impedance suggesting normal fluid levels.     Prescribed: Furosemide 40 mg take 2 tablets (80 mg total) twice a day alternating with 80 mg every morning and 40 mg in th evening Potassium 20 mEq take 1 tablet twice a day  Metolazone 2.5 mg take 1 tablet for 1 dose with 40 mEq of Potassium   Labs: 01/03/2023 Creatinine 1.75, BUN 23, Potassium 3.8, Sodium 137, GFR 42  12/08/2022 Creatinine 1.84, BUN 32, Potassium 4.2, Sodium 137  11/18/2022 Creatinine 2.06, BUN 33, Potassium 4.4, Sodium 137 10/19/2022 Creatinine 2.20, BUN 43, Potassium 4.5, Sodium 138, GFR 32 09/28/2022 Creatinine 2.19, BUN 51, Potassium 4.2, Sodium 135, GFR 32 A complete set of results can be found in Results Review.   Recommendations:  No changes and encouraged to call if experiencing any fluid symptoms.   Follow-up plan: ICM clinic phone appointment on 03/14/2023.   91 day device clinic remote transmission 04/20/2023.     EP/Cardiology Office Visits:   04/20/2023 with Dr Quentin Ore.   Recall 03/14/2023 with Dr [name].   Copy of ICM check sent to Dr Quentin Ore.     3 month ICM trend: 02/06/2023.    12-14 Month ICM trend:     Rosalene Billings, RN 02/09/2023 5:01 PM

## 2023-02-23 ENCOUNTER — Other Ambulatory Visit: Payer: Self-pay | Admitting: *Deleted

## 2023-02-23 MED ORDER — EMPAGLIFLOZIN 10 MG PO TABS: 10.0000 mg | ORAL_TABLET | Freq: Every day | ORAL | 3 refills | Status: DC

## 2023-02-23 NOTE — Progress Notes (Signed)
Remote pacemaker transmission.   

## 2023-03-03 ENCOUNTER — Telehealth: Payer: Self-pay | Admitting: Cardiology

## 2023-03-03 ENCOUNTER — Other Ambulatory Visit (HOSPITAL_COMMUNITY): Payer: Self-pay

## 2023-03-03 MED ORDER — SPIRONOLACTONE 25 MG PO TABS: 25.0000 mg | ORAL_TABLET | Freq: Every day | ORAL | 1 refills | Status: DC

## 2023-03-03 NOTE — Telephone Encounter (Signed)
Pt stated after his ablation he was prescribed amiodarone Take 1 tablet by mouth twice a day for 1 month then reduce to 1 tablet daily .  Pt refilled Amiodarone and realized for the past 3 weeks he did not decrease the medication as advised. Pt stated he has been experiencing itching and neuropathy in his feet have worsen. Pt will like to know should he skip taking the medication a couple of days. Will forward to MD and nurse.

## 2023-03-03 NOTE — Telephone Encounter (Signed)
Pt c/o medication issue:  1. Name of Medication:  amiodarone (PACERONE) 200 MG tablet  2. How are you currently taking this medication (dosage and times per day)?   3. Are you having a reaction (difficulty breathing--STAT)?   4. What is your medication issue?   Patient states about 2 weeks ago he accidentally switched back to taking Amiodarone twice daily and he would like to know what Dr. Lalla Brothers recommends. He mentions that he has been fatigued, and his skin has been itchy and irritated.  Fatigue Skin itching irritation

## 2023-03-04 ENCOUNTER — Telehealth: Payer: Self-pay | Admitting: Cardiology

## 2023-03-04 NOTE — Telephone Encounter (Signed)
Patient stated he had not got a response as yet regarding next steps on if he should make adjustment to his amiodarone (PACERONE) 200 MG tablet medication as he accidentally took too much medication.

## 2023-03-07 NOTE — Telephone Encounter (Signed)
Ok to skip several doses and then resume at 200mg  daily.

## 2023-03-07 NOTE — Telephone Encounter (Signed)
Patient stated he has not taken Amiodarone last week Thursday and Firday. Patient stated he will gladly skip more days this week. He will like to know which days this week can he take the medication. Will forward to MD for advise.

## 2023-03-08 ENCOUNTER — Ambulatory Visit
Admission: RE | Admit: 2023-03-08 | Discharge: 2023-03-08 | Disposition: A | Discharge status: Home / Self Care | Payer: 59 | Source: Ambulatory Visit | Attending: Pulmonary Disease | Admitting: Pulmonary Disease

## 2023-03-08 DIAGNOSIS — R918 Other nonspecific abnormal finding of lung field: Secondary | ICD-10-CM

## 2023-03-08 NOTE — Telephone Encounter (Signed)
Spoke with the patient, explained Dr. Lovena Neighbours recommendation:  Please resume as prescribed.    Patient stated he is still itching , however, it's improving. Will forward to MD and nurse.

## 2023-03-14 ENCOUNTER — Ambulatory Visit: Payer: 59 | Attending: Cardiology

## 2023-03-14 DIAGNOSIS — I5042 Chronic combined systolic (congestive) and diastolic (congestive) heart failure: Secondary | ICD-10-CM | POA: Diagnosis not present

## 2023-03-14 DIAGNOSIS — Z950 Presence of cardiac pacemaker: Secondary | ICD-10-CM | POA: Diagnosis not present

## 2023-03-15 NOTE — Progress Notes (Signed)
EPIC Encounter for ICM Monitoring  Patient Name: Nicholas Foley is a 72 y.o. male Date: 03/15/2023 Primary Care Physican: Camie Patience, FNP Primary Cardiologist: Smith/Bensimhon Electrophysiologist: Lalla Brothers Nephrologist: [facility]: Dr Valentino Nose Bi-V Pacing:  98.3% 01/03/2023 Weight: 299 lbs 01/05/2023 Weight: 297 lbs 02/09/2023 Weight: 296 lbs 03/15/2023 Weight: 292 lbs    Clinical Status Since 06-Feb-2023 Time in AT/AF  <0.1 hr/day (<0.1%)         Spoke with patient and heart failure questions reviewed.  Transmission results reviewed.  Pt asymptomatic for fluid accumulation.   He does not feel like he has any fluid symptoms.  Weight is down.          Optivol thoracic impedance suggesting possible fluid accumulation starting 4/16.     Prescribed: Furosemide 40 mg take 2 tablets (80 mg total) twice a day alternating with 80 mg every morning and 40 mg in th evening Potassium 20 mEq take 1 tablet twice a day  Metolazone 2.5 mg take 1 tablet for 1 dose with 40 mEq of Potassium   Labs: 01/03/2023 Creatinine 1.75, BUN 23, Potassium 3.8, Sodium 137, GFR 42  12/08/2022 Creatinine 1.84, BUN 32, Potassium 4.2, Sodium 137  11/18/2022 Creatinine 2.06, BUN 33, Potassium 4.4, Sodium 137 10/19/2022 Creatinine 2.20, BUN 43, Potassium 4.5, Sodium 138, GFR 32 09/28/2022 Creatinine 2.19, BUN 51, Potassium 4.2, Sodium 135, GFR 32 A complete set of results can be found in Results Review.   Recommendations:  Advised to monitor and call for fluid symptoms and will recheck report 4/29.     Follow-up plan: ICM clinic phone appointment on 03/21/2023 to recheck fluid levels.   91 day device clinic remote transmission 04/20/2023.     EP/Cardiology Office Visits:   04/20/2023 with Dr Lalla Brothers.   Recall 03/14/2023 with Dr Katrinka Blazing.   Copy of ICM check sent to Dr Lalla Brothers.     3 month ICM trend: 03/13/2023.    12-14 Month ICM trend:     Karie Soda, RN 03/15/2023 2:37 PM

## 2023-03-21 ENCOUNTER — Ambulatory Visit: Payer: 59 | Attending: Cardiology

## 2023-03-21 DIAGNOSIS — Z950 Presence of cardiac pacemaker: Secondary | ICD-10-CM

## 2023-03-21 DIAGNOSIS — I5042 Chronic combined systolic (congestive) and diastolic (congestive) heart failure: Secondary | ICD-10-CM

## 2023-03-21 NOTE — Progress Notes (Signed)
EPIC Encounter for ICM Monitoring  Patient Name: Nicholas Foley is a 72 y.o. male Date: 03/21/2023 Primary Care Physican: Camie Patience, FNP Primary Cardiologist: Smith/Bensimhon Electrophysiologist: Lalla Brothers Nephrologist: [facility]: Dr Valentino Nose Bi-V Pacing:  98.0% 01/03/2023 Weight: 299 lbs 01/05/2023 Weight: 297 lbs 02/09/2023 Weight: 296 lbs 03/15/2023 Weight: 292 lbs    Clinical Status Since 13-Mar-2023 Time in AT/AF  0.2 hr/day (0.9%)         Spoke with patient and heart failure questions reviewed.  Transmission results reviewed.  Pt asymptomatic for fluid accumulation but has not generally felt well for past 2 days.         Optivol thoracic impedance suggesting fluid levels returned to normal but slightly trending down for past 2 days.       Prescribed: Furosemide 40 mg take 2 tablets (80 mg total) twice a day alternating with 80 mg every morning and 40 mg in th evening Potassium 20 mEq take 1 tablet twice a day  Metolazone 2.5 mg take 1 tablet for 1 dose with 40 mEq of Potassium   Labs: 01/03/2023 Creatinine 1.75, BUN 23, Potassium 3.8, Sodium 137, GFR 42  12/08/2022 Creatinine 1.84, BUN 32, Potassium 4.2, Sodium 137  11/18/2022 Creatinine 2.06, BUN 33, Potassium 4.4, Sodium 137 10/19/2022 Creatinine 2.20, BUN 43, Potassium 4.5, Sodium 138, GFR 32 09/28/2022 Creatinine 2.19, BUN 51, Potassium 4.2, Sodium 135, GFR 32 A complete set of results can be found in Results Review.   Recommendations:  He will limit fluid intake and advised to monitor and call for fluid symptoms.     Follow-up plan: ICM clinic phone appointment on 04/21/2023.   91 day device clinic remote transmission 04/20/2023.     EP/Cardiology Office Visits:   04/20/2023 with Dr Lalla Brothers.   Recall 03/14/2023 with Dr Katrinka Blazing.   Copy of ICM check sent to Dr Lalla Brothers.    3 month ICM trend: 03/21/2023.    12-14 Month ICM trend:     Karie Soda, RN 03/21/2023 12:47 PM

## 2023-03-28 ENCOUNTER — Other Ambulatory Visit: Payer: Self-pay | Admitting: *Deleted

## 2023-03-28 DIAGNOSIS — Z87891 Personal history of nicotine dependence: Secondary | ICD-10-CM

## 2023-04-01 ENCOUNTER — Other Ambulatory Visit: Payer: Self-pay

## 2023-04-01 MED ORDER — CARVEDILOL 25 MG PO TABS: 25.0000 mg | ORAL_TABLET | Freq: Two times a day (BID) | ORAL | 1 refills | Status: DC

## 2023-04-20 ENCOUNTER — Encounter: Payer: Self-pay | Admitting: Cardiology

## 2023-04-20 ENCOUNTER — Ambulatory Visit (INDEPENDENT_AMBULATORY_CARE_PROVIDER_SITE_OTHER): Payer: 59

## 2023-04-20 ENCOUNTER — Ambulatory Visit: Payer: 59 | Attending: Cardiology | Admitting: Cardiology

## 2023-04-20 VITALS — BP 126/70 | HR 76 | Ht 71.0 in | Wt 300.0 lb

## 2023-04-20 DIAGNOSIS — I4819 Other persistent atrial fibrillation: Secondary | ICD-10-CM | POA: Diagnosis not present

## 2023-04-20 DIAGNOSIS — I5042 Chronic combined systolic (congestive) and diastolic (congestive) heart failure: Secondary | ICD-10-CM

## 2023-04-20 DIAGNOSIS — I428 Other cardiomyopathies: Secondary | ICD-10-CM

## 2023-04-20 DIAGNOSIS — Z79899 Other long term (current) drug therapy: Secondary | ICD-10-CM

## 2023-04-20 DIAGNOSIS — Z950 Presence of cardiac pacemaker: Secondary | ICD-10-CM | POA: Diagnosis not present

## 2023-04-20 LAB — CUP PACEART INCLINIC DEVICE CHECK
Battery Remaining Longevity: 55 mo
Battery Voltage: 2.96 V
Brady Statistic AP VP Percent: 0.11 %
Brady Statistic AP VS Percent: 0.01 %
Brady Statistic AS VP Percent: 98.07 %
Brady Statistic AS VS Percent: 1.8 %
Brady Statistic RA Percent Paced: 0.15 %
Brady Statistic RV Percent Paced: 89 %
Date Time Interrogation Session: 20240529163951
Implantable Lead Connection Status: 753985
Implantable Lead Connection Status: 753985
Implantable Lead Connection Status: 753985
Implantable Lead Implant Date: 20200123
Implantable Lead Implant Date: 20200123
Implantable Lead Implant Date: 20200123
Implantable Lead Location: 753858
Implantable Lead Location: 753859
Implantable Lead Location: 753860
Implantable Lead Model: 4598
Implantable Lead Model: 5076
Implantable Lead Model: 5076
Implantable Pulse Generator Implant Date: 20200123
Lead Channel Impedance Value: 1045 Ohm
Lead Channel Impedance Value: 1083 Ohm
Lead Channel Impedance Value: 1159 Ohm
Lead Channel Impedance Value: 1197 Ohm
Lead Channel Impedance Value: 1463 Ohm
Lead Channel Impedance Value: 1520 Ohm
Lead Channel Impedance Value: 380 Ohm
Lead Channel Impedance Value: 418 Ohm
Lead Channel Impedance Value: 475 Ohm
Lead Channel Impedance Value: 475 Ohm
Lead Channel Impedance Value: 494 Ohm
Lead Channel Impedance Value: 779 Ohm
Lead Channel Impedance Value: 836 Ohm
Lead Channel Impedance Value: 950 Ohm
Lead Channel Pacing Threshold Amplitude: 0.5 V
Lead Channel Pacing Threshold Amplitude: 0.625 V
Lead Channel Pacing Threshold Amplitude: 2.5 V
Lead Channel Pacing Threshold Pulse Width: 0.4 ms
Lead Channel Pacing Threshold Pulse Width: 0.4 ms
Lead Channel Pacing Threshold Pulse Width: 0.8 ms
Lead Channel Sensing Intrinsic Amplitude: 1.25 mV
Lead Channel Sensing Intrinsic Amplitude: 1.75 mV
Lead Channel Sensing Intrinsic Amplitude: 4.25 mV
Lead Channel Sensing Intrinsic Amplitude: 5.25 mV
Lead Channel Setting Pacing Amplitude: 1.5 V
Lead Channel Setting Pacing Amplitude: 2.5 V
Lead Channel Setting Pacing Amplitude: 3.75 V
Lead Channel Setting Pacing Pulse Width: 0.4 ms
Lead Channel Setting Pacing Pulse Width: 0.8 ms
Lead Channel Setting Sensing Sensitivity: 2 mV
Zone Setting Status: 755011
Zone Setting Status: 755011

## 2023-04-20 MED ORDER — AMIODARONE HCL 100 MG PO TABS: 100.0000 mg | ORAL_TABLET | Freq: Every day | ORAL | 3 refills | Status: DC

## 2023-04-20 NOTE — Patient Instructions (Signed)
Medication Instructions:  Your physician has recommended you make the following change in your medication:  1) DECREASE amiodarone to 100 mg daily  *If you need a refill on your cardiac medications before your next appointment, please call your pharmacy*  Lab Work: IN 6-8 WEEKS: CMET, TSH, T4  Follow-Up: At [facility], you and your health needs are our priority.  As part of our continuing mission to provide you with exceptional heart care, we have created designated Provider Care Teams.  These Care Teams include your primary Cardiologist (physician) and Advanced Practice Providers (APPs -  Physician Assistants and Nurse Practitioners) who all work together to provide you with the care you need, when you need it.  Your next appointment:   6 month(s)  Provider:   Steffanie Dunn, MD

## 2023-04-20 NOTE — Progress Notes (Signed)
Electrophysiology Office Follow up Visit Note:    Date:  04/20/2023   ID:  Nicholas Foley, DOB August 08, 1951, MRN [id number]  PCP:  Camie Patience, FNP  [facility] Cardiologist:  Lesleigh Noe, MD (Inactive)  [facility] Electrophysiologist:  Lanier Prude, MD    Interval History:    Nicholas Foley is a 72 y.o. male who presents for a follow up visit.   He had an A-fib ablation December 30, 2022 during which the pulmonary veins and posterior wall were isolated. He saw Karwowski in the A-fib clinic on January 27, 2023.  At that appointment he reported no episodes of atrial fibrillation after the ablation.  He was still taking amiodarone 200 mg by mouth once daily in addition to Eliquis twice daily.  Today, he states he is feeling good. We reviewed his device interrogation in detail.  He complains of a "flare-up spell" once every 10 days where he doesn't feel well and is extremely fatigued. However, he is not certain if his symptoms are attributable to recurring arrhythmias or to his age. No palpitations.  For exercise he goes to the gym twice a week and works with a Psychologist, educational. Generally he does well with exercise without significant symptoms. He reports successful weight loss over the past year since his peak around 340-345 lbs. In the office he is 300 lbs; per his home scale he is averaging 293 lbs.  He denies any chest pain, shortness of breath, or peripheral edema. No lightheadedness, headaches, syncope, orthopnea, or PND.      Past medical, surgical, social and family history were reviewed.  ROS:   Please see the history of present illness.    (+) Intermittent fatigue/malaise All other systems reviewed and are negative.  EKGs/Labs/Other Studies Reviewed:    The following studies were reviewed today:   Apr 20, 2023 in clinic device interrogation personally reviewed Longevity 4.4 years Lead parameter stable OptiVol okay AF burden less than 0.1% Effective BiV pacing  98.2     Physical Exam:    VS:  BP 126/70   Pulse 76   Ht 5\' 11"  (1.803 m)   Wt 300 lb (136.1 kg)   SpO2 98%   BMI 41.84 kg/m     Wt Readings from Last 3 Encounters:  04/20/23 300 lb (136.1 kg)  01/27/23 (!) 303 lb 3.2 oz (137.5 kg)  01/03/23 (!) 304 lb (137.9 kg)     GEN:  Well nourished, well developed in no acute distress CARDIAC: RRR, no murmurs, rubs, gallops. CRT-P pocket well healed. RESPIRATORY:  Clear to auscultation without rales, wheezing or rhonchi       ASSESSMENT:    1. Chronic combined systolic and diastolic heart failure (HCC)   2. Biventricular cardiac pacemaker in situ   3. Morbid obesity (HCC)   4. Persistent atrial fibrillation (HCC)   5. Encounter for long-term (current) use of high-risk medication    PLAN:    In order of problems listed above:  #Persistent atrial fibrillation #High risk med monitoring-amiodarone Doing well after his December 30, 2022 catheter ablation.  Maintaining sinus rhythm. Reduce amiodarone to 100 mg by mouth once daily Check CMP, TSH and free T4 in 6 to 8 weeks Continue Eliquis twice daily  #Chronic systolic heart failure #CRT-P in situ NYHA class II.  Warm and dry on exam.  EF 50 to 55% on transesophageal echo in February of this year. Continue GDMT  Follow-up 6 months with APP.  I,Mathew Stumpf,acting as a Neurosurgeon for Lanier Prude, MD.,have documented all relevant documentation on the behalf of Lanier Prude, MD,as directed by  Lanier Prude, MD while in the presence of Lanier Prude, MD.  I, Lanier Prude, MD, have reviewed all documentation for this visit. The documentation on 04/20/23 for the exam, diagnosis, procedures, and orders are all accurate and complete.   Signed, Steffanie Dunn, MD, [facility], [facility] 04/20/2023 2:53 PM    Electrophysiology [facility]

## 2023-04-20 NOTE — Progress Notes (Deleted)
  Electrophysiology Office Follow up Visit Note:    Date:  04/20/2023   ID:  Nicholas Foley, DOB 11-13-1951, MRN [id number]  PCP:  Camie Patience, FNP  [facility] Cardiologist:  Lesleigh Noe, MD (Inactive)  [facility] Electrophysiologist:  Lanier Prude, MD    Interval History:    Nicholas Foley is a 72 y.o. male who presents for a follow up visit.   He had an A-fib ablation December 30, 2022 during which the pulmonary veins and posterior wall were isolated. He saw Baade in the A-fib clinic on January 27, 2023.  At that appointment he reported no episodes of atrial fibrillation after the ablation.  He was still taking amiodarone 200 mg by mouth once daily in addition to Eliquis twice daily.  Today,***      Past medical, surgical, social and family history were reviewed.  ROS:   Please see the history of present illness.    All other systems reviewed and are negative.  EKGs/Labs/Other Studies Reviewed:    The following studies were reviewed today:   Apr 20, 2023 in clinic device interrogation personally reviewed ***    Physical Exam:    VS:  There were no vitals taken for this visit.    Wt Readings from Last 3 Encounters:  01/27/23 (!) 303 lb 3.2 oz (137.5 kg)  01/03/23 (!) 304 lb (137.9 kg)  12/30/22 300 lb (136.1 kg)     GEN: *** Well nourished, well developed in no acute distress CARDIAC: ***RRR, no murmurs, rubs, gallops RESPIRATORY:  Clear to auscultation without rales, wheezing or rhonchi       ASSESSMENT:    1. Chronic combined systolic and diastolic heart failure (HCC)   2. Biventricular cardiac pacemaker in situ   3. Morbid obesity (HCC)   4. Persistent atrial fibrillation (HCC)   5. Encounter for long-term (current) use of high-risk medication    PLAN:    In order of problems listed above:  #Persistent atrial fibrillation #High risk med monitoring-amiodarone Doing well after his December 30, 2022 catheter ablation.  Maintaining  sinus rhythm. Stop amiodarone today Continue Eliquis twice daily  #Chronic systolic heart failure #CRT-P in situ NYHA class II.  Warm and dry on exam.  EF 50 to 55% on transesophageal echo in February of this year. Continue GDMT  Follow-up 6 months with APP.      Signed, Steffanie Dunn, MD, [facility], [facility] 04/20/2023 7:46 AM    Electrophysiology [facility]

## 2023-04-21 ENCOUNTER — Ambulatory Visit: Payer: 59 | Attending: Cardiology

## 2023-04-21 ENCOUNTER — Telehealth: Payer: Self-pay

## 2023-04-21 DIAGNOSIS — I5042 Chronic combined systolic (congestive) and diastolic (congestive) heart failure: Secondary | ICD-10-CM

## 2023-04-21 DIAGNOSIS — Z950 Presence of cardiac pacemaker: Secondary | ICD-10-CM | POA: Diagnosis not present

## 2023-04-21 LAB — CUP PACEART REMOTE DEVICE CHECK
Battery Remaining Longevity: 54 mo
Battery Voltage: 2.96 V
Brady Statistic AP VP Percent: 0.08 %
Brady Statistic AP VS Percent: 0.01 %
Brady Statistic AS VP Percent: 98.19 %
Brady Statistic AS VS Percent: 1.72 %
Brady Statistic RA Percent Paced: 0.09 %
Brady Statistic RV Percent Paced: 89.73 %
Date Time Interrogation Session: 20240528230138
Implantable Lead Connection Status: 753985
Implantable Lead Connection Status: 753985
Implantable Lead Connection Status: 753985
Implantable Lead Implant Date: 20200123
Implantable Lead Implant Date: 20200123
Implantable Lead Implant Date: 20200123
Implantable Lead Location: 753858
Implantable Lead Location: 753859
Implantable Lead Location: 753860
Implantable Lead Model: 4598
Implantable Lead Model: 5076
Implantable Lead Model: 5076
Implantable Pulse Generator Implant Date: 20200123
Lead Channel Impedance Value: 1083 Ohm
Lead Channel Impedance Value: 1140 Ohm
Lead Channel Impedance Value: 1159 Ohm
Lead Channel Impedance Value: 1216 Ohm
Lead Channel Impedance Value: 1539 Ohm
Lead Channel Impedance Value: 1596 Ohm
Lead Channel Impedance Value: 380 Ohm
Lead Channel Impedance Value: 418 Ohm
Lead Channel Impedance Value: 418 Ohm
Lead Channel Impedance Value: 418 Ohm
Lead Channel Impedance Value: 494 Ohm
Lead Channel Impedance Value: 817 Ohm
Lead Channel Impedance Value: 874 Ohm
Lead Channel Impedance Value: 931 Ohm
Lead Channel Pacing Threshold Amplitude: 0.5 V
Lead Channel Pacing Threshold Amplitude: 0.75 V
Lead Channel Pacing Threshold Amplitude: 2.75 V
Lead Channel Pacing Threshold Pulse Width: 0.4 ms
Lead Channel Pacing Threshold Pulse Width: 0.4 ms
Lead Channel Pacing Threshold Pulse Width: 0.8 ms
Lead Channel Sensing Intrinsic Amplitude: 1 mV
Lead Channel Sensing Intrinsic Amplitude: 1 mV
Lead Channel Sensing Intrinsic Amplitude: 5.75 mV
Lead Channel Sensing Intrinsic Amplitude: 5.75 mV
Lead Channel Setting Pacing Amplitude: 1.5 V
Lead Channel Setting Pacing Amplitude: 2.5 V
Lead Channel Setting Pacing Amplitude: 3.75 V
Lead Channel Setting Pacing Pulse Width: 0.4 ms
Lead Channel Setting Pacing Pulse Width: 0.8 ms
Lead Channel Setting Sensing Sensitivity: 2 mV
Zone Setting Status: 755011
Zone Setting Status: 755011

## 2023-04-21 NOTE — Progress Notes (Signed)
EPIC Encounter for ICM Monitoring  Patient Name: Nicholas Foley is a 72 y.o. male Date: 04/21/2023 Primary Care Physican: Camie Patience, FNP Primary Cardiologist: [facility] Electrophysiologist: Lalla Brothers Nephrologist: [facility]: Dr Valentino Nose Bi-V Pacing:  98.3% 01/03/2023 Weight: 299 lbs 01/05/2023 Weight: 297 lbs 02/09/2023 Weight: 296 lbs 03/15/2023 Weight: 292 lbs    Clinical Status Since 20-Mar-2023 Time in AT/AF  0.0 hr/day (0.0%)         Attempted call to patient and unable to reach.  Left detailed message per DPR regarding transmission. Transmission reviewed.          Optivol thoracic impedance suggesting normal fluid levels with the exception of possible fluid accumulation from 5/20-5/24.       Prescribed: Furosemide 40 mg take 2 tablets (80 mg total) twice a day. Potassium 20 mEq take 1 tablet twice a day  Metolazone 2.5 mg take 1 tablet for 1 dose with 40 mEq of Potassium   Labs: 01/03/2023 Creatinine 1.75, BUN 23, Potassium 3.8, Sodium 137, GFR 42  12/08/2022 Creatinine 1.84, BUN 32, Potassium 4.2, Sodium 137  11/18/2022 Creatinine 2.06, BUN 33, Potassium 4.4, Sodium 137 10/19/2022 Creatinine 2.20, BUN 43, Potassium 4.5, Sodium 138, GFR 32 09/28/2022 Creatinine 2.19, BUN 51, Potassium 4.2, Sodium 135, GFR 32 A complete set of results can be found in Results Review.   Recommendations:  Left voice mail with ICM number and encouraged to call if experiencing any fluid symptoms.   Follow-up plan: ICM clinic phone appointment on 05/23/2023.   91 day device clinic remote transmission 07/20/2023.     EP/Cardiology Office Visits:   10/26/2023 with Dr Lalla Brothers.   Recall 04/03/2023 with Dr Gala Romney.  Recall 03/14/2023 with Dr Katrinka Blazing.   Copy of ICM check sent to Dr Lalla Brothers.    3 month ICM trend: 04/19/2023.    12-14 Month ICM trend:     Karie Soda, RN 04/21/2023 8:47 AM

## 2023-04-21 NOTE — Telephone Encounter (Signed)
Remote ICM transmission received.  Attempted call to patient regarding ICM remote transmission and left detailed message per DPR.  Advised to return call for any fluid symptoms or questions. Next ICM remote transmission scheduled 05/23/2023.

## 2023-05-12 NOTE — Progress Notes (Signed)
Remote pacemaker transmission.   

## 2023-05-23 ENCOUNTER — Ambulatory Visit: Payer: 59 | Attending: Cardiology

## 2023-05-23 ENCOUNTER — Telehealth: Payer: Self-pay | Admitting: Cardiology

## 2023-05-23 DIAGNOSIS — I5042 Chronic combined systolic (congestive) and diastolic (congestive) heart failure: Secondary | ICD-10-CM | POA: Diagnosis not present

## 2023-05-23 DIAGNOSIS — Z950 Presence of cardiac pacemaker: Secondary | ICD-10-CM | POA: Diagnosis not present

## 2023-05-23 NOTE — Telephone Encounter (Signed)
Pt c/o medication issue:  1. Name of Medication:   carvedilol (COREG) 25 MG tablet   2. How are you currently taking this medication (dosage and times per day)? As prescribed  3. Are you having a reaction (difficulty breathing--STAT)?   No  4. What is your medication issue?   Patient stated his pharmacist told him this medication cannot be refilled.  Patient stated he needs to confirm if he still needs to be taking this medication.

## 2023-05-23 NOTE — Telephone Encounter (Signed)
Left message for patient advising that he should continue to take carvedilol. Advised to call back with any further questions/concerns.

## 2023-05-24 ENCOUNTER — Other Ambulatory Visit: Payer: Self-pay

## 2023-05-24 MED ORDER — CARVEDILOL 25 MG PO TABS: 25.0000 mg | ORAL_TABLET | Freq: Two times a day (BID) | ORAL | 3 refills | Status: DC

## 2023-05-24 NOTE — Progress Notes (Signed)
EPIC Encounter for ICM Monitoring  Patient Name: Nicholas Foley is a 72 y.o. male Date: 05/24/2023 Primary Care Physican: Camie Patience, FNP Primary Cardiologist: [facility] Electrophysiologist: Lalla Brothers Nephrologist: [facility]: Dr Valentino Nose Bi-V Pacing:  98.3% 01/03/2023 Weight: 299 lbs 01/05/2023 Weight: 297 lbs 02/09/2023 Weight: 296 lbs 03/15/2023 Weight: 292 lbs    Clinical Status Since 20-Apr-2023 Time in AT/AF  <0.1 hr/day (<0.1%)         Spoke with patient and heart failure questions reviewed.  Transmission results reviewed.  Pt asymptomatic for fluid accumulation.  He ate a lot of junk food over the weekend that was high in salt which may have contributed to fluid accumulation.           Optivol thoracic impedance suggesting possible fluid accumulation starting 6/26.       Prescribed: Furosemide 40 mg take 2 tablets (80 mg total) twice a day. Potassium 20 mEq take 1 tablet twice a day  Metolazone 2.5 mg take 1 tablet for 1 dose with 40 mEq of Potassium   Labs: 01/03/2023 Creatinine 1.75, BUN 23, Potassium 3.8, Sodium 137, GFR 42  12/08/2022 Creatinine 1.84, BUN 32, Potassium 4.2, Sodium 137  11/18/2022 Creatinine 2.06, BUN 33, Potassium 4.4, Sodium 137 10/19/2022 Creatinine 2.20, BUN 43, Potassium 4.5, Sodium 138, GFR 32 09/28/2022 Creatinine 2.19, BUN 51, Potassium 4.2, Sodium 135, GFR 32 A complete set of results can be found in Results Review.   Recommendations:  Advised to limit salt intake.  He will self adjust Furosemide and take extra 40 mg.     Follow-up plan: ICM clinic phone appointment on 05/31/2023 to recheck fluid levels.   91 day device clinic remote transmission 07/20/2023.     EP/Cardiology Office Visits:   10/26/2023 with Dr Lalla Brothers.   Recall 04/03/2023 with Dr Gala Romney.     Copy of ICM check sent to Dr Lalla Brothers.    3 month ICM trend: 05/23/2023.    12-14 Month ICM trend:     Karie Soda, RN 05/24/2023 3:20 PM

## 2023-05-31 ENCOUNTER — Ambulatory Visit: Payer: 59 | Attending: Cardiology

## 2023-05-31 DIAGNOSIS — Z950 Presence of cardiac pacemaker: Secondary | ICD-10-CM

## 2023-05-31 DIAGNOSIS — I5042 Chronic combined systolic (congestive) and diastolic (congestive) heart failure: Secondary | ICD-10-CM

## 2023-06-01 ENCOUNTER — Telehealth: Payer: Self-pay

## 2023-06-01 ENCOUNTER — Ambulatory Visit: Payer: 59

## 2023-06-01 NOTE — Progress Notes (Signed)
EPIC Encounter for ICM Monitoring  Patient Name: Nicholas Foley is a 72 y.o. male Date: 06/01/2023 Primary Care Physican: Camie Patience, FNP Primary Cardiologist: [facility] Electrophysiologist: Lalla Brothers Nephrologist: [facility]: Dr Valentino Nose Bi-V Pacing:  98.3% 01/03/2023 Weight: 299 lbs 01/05/2023 Weight: 297 lbs 02/09/2023 Weight: 296 lbs 03/15/2023 Weight: 292 lbs    Clinical Status Since 22-May-2023 Time in AT/AF  <0.1 hr/day (<0.1%)         Attempted call to patient and unable to reach.  Left detailed message per DPR regarding transmission. Transmission reviewed.          Optivol thoracic impedance suggesting fluid levels returned to normal.       Prescribed: Furosemide 40 mg take 2 tablets (80 mg total) twice a day. Potassium 20 mEq take 1 tablet twice a day  Metolazone 2.5 mg take 1 tablet for 1 dose with 40 mEq of Potassium   Labs: 01/03/2023 Creatinine 1.75, BUN 23, Potassium 3.8, Sodium 137, GFR 42  12/08/2022 Creatinine 1.84, BUN 32, Potassium 4.2, Sodium 137  A complete set of results can be found in Results Review.   Recommendations:  Left voice mail with ICM number and encouraged to call if experiencing any fluid symptoms.   Follow-up plan: ICM clinic phone appointment on 06/27/2023.   91 day device clinic remote transmission 07/20/2023.     EP/Cardiology Office Visits:   10/26/2023 with Dr Lalla Brothers.   Recall 04/03/2023 with Dr Gala Romney.     Copy of ICM check sent to Dr Lalla Brothers.    3 month ICM trend: 05/30/2023.    12-14 Month ICM trend:     Karie Soda, RN 06/01/2023 1:31 PM

## 2023-06-01 NOTE — Telephone Encounter (Signed)
Remote ICM transmission received.  Attempted call to patient regarding ICM remote transmission and left detailed message per DPR.  Left ICM phone number and advised to return call for any fluid symptoms or questions. Next ICM remote transmission scheduled 06/27/2023.    

## 2023-06-08 ENCOUNTER — Ambulatory Visit: Payer: 59 | Attending: Cardiology

## 2023-06-08 DIAGNOSIS — Z950 Presence of cardiac pacemaker: Secondary | ICD-10-CM

## 2023-06-08 DIAGNOSIS — Z79899 Other long term (current) drug therapy: Secondary | ICD-10-CM

## 2023-06-08 DIAGNOSIS — I5042 Chronic combined systolic (congestive) and diastolic (congestive) heart failure: Secondary | ICD-10-CM

## 2023-06-08 DIAGNOSIS — I4819 Other persistent atrial fibrillation: Secondary | ICD-10-CM

## 2023-06-09 LAB — COMPREHENSIVE METABOLIC PANEL
ALT: 15 IU/L (ref 0–44)
AST: 15 IU/L (ref 0–40)
Albumin: 4.5 g/dL (ref 3.9–4.9)
Alkaline Phosphatase: 104 IU/L (ref 44–121)
BUN/Creatinine Ratio: 16 (ref 10–24)
BUN: 33 mg/dL — ABNORMAL HIGH (ref 8–27)
Bilirubin Total: 0.3 mg/dL (ref 0.0–1.2)
CO2: 20 mmol/L (ref 20–29)
Calcium: 8.9 mg/dL (ref 8.6–10.2)
Chloride: 100 mmol/L (ref 96–106)
Creatinine, Ser: 2.03 mg/dL — ABNORMAL HIGH (ref 0.76–1.27)
Globulin, Total: 3 g/dL (ref 1.5–4.5)
Glucose: 72 mg/dL (ref 70–99)
Potassium: 4.9 mmol/L (ref 3.5–5.2)
Sodium: 136 mmol/L (ref 134–144)
Total Protein: 7.5 g/dL (ref 6.0–8.5)
eGFR: 35 mL/min/{1.73_m2} — ABNORMAL LOW (ref >59)

## 2023-06-09 LAB — T4, FREE: Free T4: 1.32 ng/dL (ref 0.82–1.77)

## 2023-06-09 LAB — TSH: TSH: 1.54 u[IU]/mL (ref 0.450–4.500)

## 2023-06-27 ENCOUNTER — Ambulatory Visit: Payer: 59 | Attending: Cardiology

## 2023-06-27 DIAGNOSIS — I5042 Chronic combined systolic (congestive) and diastolic (congestive) heart failure: Secondary | ICD-10-CM

## 2023-06-27 DIAGNOSIS — Z950 Presence of cardiac pacemaker: Secondary | ICD-10-CM | POA: Diagnosis not present

## 2023-06-27 NOTE — Progress Notes (Signed)
EPIC Encounter for ICM Monitoring  Patient Name: Nicholas Foley is a 72 y.o. male Date: 06/27/2023 Primary Care Physican: Camie Patience, FNP Primary Cardiologist: [facility] Electrophysiologist: Lalla Brothers Nephrologist:  [facility]: Dr Valentino Nose Bi-V Pacing:  98.1% 01/03/2023 Weight: 299 lbs 01/05/2023 Weight: 297 lbs 02/09/2023 Weight: 296 lbs 03/15/2023 Weight: 292 lbs    Clinical Status Since 22-May-2023 Time in AT/AF  <0.1 hr/day (<0.1%)         Spoke with patient and heart failure questions reviewed.  Transmission results reviewed.  Pt feels like he has a little fluid.  He ate foods high in salt and drank more liquids over the weekend due to birthday celebration.  Was not consistent with taking Furosemide as prescribed since Friday.           Optivol thoracic impedance suggesting possible fluid accumulation 7/28.      Prescribed: Furosemide 40 mg take 2 tablets (80 mg total) twice a day.  Pt reports 8/5 taking differently:  1 Tablet every morning and 1 tablet every other evening (changed by physician) Potassium 20 mEq take 1 tablet twice a day  Metolazone 2.5 mg take 1 tablet for 1 dose with 40 mEq of Potassium   Labs: 01/03/2023 Creatinine 1.75, BUN 23, Potassium 3.8, Sodium 137, GFR 42  12/08/2022 Creatinine 1.84, BUN 32, Potassium 4.2, Sodium 137  A complete set of results can be found in Results Review.   Recommendations:  He will take Furosemide as prescribed and take extra if needed.   Follow-up plan: ICM clinic phone appointment on 07/04/2023 to recheck fluid levels.   91 day device clinic remote transmission 07/20/2023.     EP/Cardiology Office Visits:   10/26/2023 with Dr Lalla Brothers.   Recall 04/03/2023 with Dr Gala Romney.     Copy of ICM check sent to Dr Lalla Brothers.  Sent as FYI to Dr Gala Romney.  3 month ICM trend: 06/26/2023.    12-14 Month ICM trend:     Karie Soda, RN 06/27/2023 3:18 PM

## 2023-07-04 ENCOUNTER — Ambulatory Visit: Payer: 59

## 2023-07-04 DIAGNOSIS — I5042 Chronic combined systolic (congestive) and diastolic (congestive) heart failure: Secondary | ICD-10-CM

## 2023-07-04 DIAGNOSIS — Z950 Presence of cardiac pacemaker: Secondary | ICD-10-CM

## 2023-07-06 NOTE — Progress Notes (Signed)
EPIC Encounter for ICM Monitoring  Patient Name: Nicholas Foley is a 72 y.o. male Date: 07/06/2023 Primary Care Physican: Camie Patience, FNP Primary Cardiologist: [facility] Electrophysiologist: Lalla Brothers Nephrologist: [facility]: Dr Valentino Nose Bi-V Pacing:  98.2% 01/03/2023 Weight: 299 lbs 01/05/2023 Weight: 297 lbs 02/09/2023 Weight: 296 lbs 03/15/2023 Weight: 292 lbs    Clinical Status Since 26-Jun-2023 Time in AT/AF  0.0 hr/day (0.0%)         Attempted call to patient and unable to reach.  Left detailed message per DPR regarding transmission. Transmission reviewed.          Optivol thoracic impedance suggesting fluid levels returned to normal.      Prescribed: Furosemide 40 mg take 2 tablets (80 mg total) twice a day.  Pt reports 8/5 taking differently:  1 Tablet every morning and 1 tablet every other evening (changed by physician) Potassium 20 mEq take 1 tablet twice a day  Metolazone 2.5 mg take 1 tablet for 1 dose with 40 mEq of Potassium   Labs: 01/03/2023 Creatinine 1.75, BUN 23, Potassium 3.8, Sodium 137, GFR 42  12/08/2022 Creatinine 1.84, BUN 32, Potassium 4.2, Sodium 137  A complete set of results can be found in Results Review.   Recommendations:  Left voice mail with ICM number and encouraged to call if experiencing any fluid symptoms.   Follow-up plan: ICM clinic phone appointment on 08/01/2023.   91 day device clinic remote transmission 07/20/2023.     EP/Cardiology Office Visits:   10/26/2023 with Dr Lalla Brothers.   Recall 04/03/2023 with Dr Gala Romney.     Copy of ICM check sent to Dr Lalla Brothers.   3 month ICM trend: 07/03/2023.    12-14 Month ICM trend:     Karie Soda, RN 07/06/2023 10:12 AM

## 2023-07-08 ENCOUNTER — Other Ambulatory Visit: Payer: Self-pay | Admitting: Internal Medicine

## 2023-07-20 ENCOUNTER — Ambulatory Visit (INDEPENDENT_AMBULATORY_CARE_PROVIDER_SITE_OTHER): Payer: 59

## 2023-07-20 ENCOUNTER — Encounter: Payer: 59 | Admitting: Cardiology

## 2023-07-20 ENCOUNTER — Telehealth: Payer: Self-pay | Admitting: Cardiology

## 2023-07-20 DIAGNOSIS — I428 Other cardiomyopathies: Secondary | ICD-10-CM | POA: Diagnosis not present

## 2023-07-20 DIAGNOSIS — I4819 Other persistent atrial fibrillation: Secondary | ICD-10-CM

## 2023-07-20 LAB — CUP PACEART REMOTE DEVICE CHECK
Battery Remaining Longevity: 49 mo
Battery Voltage: 2.95 V
Brady Statistic AP VP Percent: 0.07 %
Brady Statistic AP VS Percent: 0.01 %
Brady Statistic AS VP Percent: 98.24 %
Brady Statistic AS VS Percent: 1.67 %
Brady Statistic RA Percent Paced: 0.09 %
Brady Statistic RV Percent Paced: 89.55 %
Date Time Interrogation Session: 20240828073553
Implantable Lead Connection Status: 753985
Implantable Lead Connection Status: 753985
Implantable Lead Connection Status: 753985
Implantable Lead Implant Date: 20200123
Implantable Lead Implant Date: 20200123
Implantable Lead Implant Date: 20200123
Implantable Lead Location: 753858
Implantable Lead Location: 753859
Implantable Lead Location: 753860
Implantable Lead Model: 4598
Implantable Lead Model: 5076
Implantable Lead Model: 5076
Implantable Pulse Generator Implant Date: 20200123
Lead Channel Impedance Value: 1026 Ohm
Lead Channel Impedance Value: 1045 Ohm
Lead Channel Impedance Value: 1083 Ohm
Lead Channel Impedance Value: 1140 Ohm
Lead Channel Impedance Value: 1425 Ohm
Lead Channel Impedance Value: 1482 Ohm
Lead Channel Impedance Value: 361 Ohm
Lead Channel Impedance Value: 380 Ohm
Lead Channel Impedance Value: 399 Ohm
Lead Channel Impedance Value: 418 Ohm
Lead Channel Impedance Value: 456 Ohm
Lead Channel Impedance Value: 760 Ohm
Lead Channel Impedance Value: 817 Ohm
Lead Channel Impedance Value: 855 Ohm
Lead Channel Pacing Threshold Amplitude: 0.375 V
Lead Channel Pacing Threshold Amplitude: 0.625 V
Lead Channel Pacing Threshold Amplitude: 2.75 V
Lead Channel Pacing Threshold Pulse Width: 0.4 ms
Lead Channel Pacing Threshold Pulse Width: 0.4 ms
Lead Channel Pacing Threshold Pulse Width: 0.8 ms
Lead Channel Sensing Intrinsic Amplitude: 1.25 mV
Lead Channel Sensing Intrinsic Amplitude: 1.25 mV
Lead Channel Sensing Intrinsic Amplitude: 6.125 mV
Lead Channel Sensing Intrinsic Amplitude: 6.125 mV
Lead Channel Setting Pacing Amplitude: 1.5 V
Lead Channel Setting Pacing Amplitude: 2.5 V
Lead Channel Setting Pacing Amplitude: 3.75 V
Lead Channel Setting Pacing Pulse Width: 0.4 ms
Lead Channel Setting Pacing Pulse Width: 0.8 ms
Lead Channel Setting Sensing Sensitivity: 2 mV
Zone Setting Status: 755011
Zone Setting Status: 755011

## 2023-07-20 NOTE — Telephone Encounter (Signed)
Spoke with the patient who reports that he is stilling having problems with fatigue and shortness of breath. He states that it overtakes him about once every 2-3 weeks and he is unable to do much or work. He states that on other days he can do all of his normal activities and workout at the gym and then one day it hits him and he feels like he has the "flu without a fever". He states that on these days he has no energy. Several months ago amiodarone and entresto were decreased and he thought that he might feel better after this, however he does not. He has an ICD and did have a remote device check today.  I have scheduled him for an appointment with Suzann on Friday 8/30.

## 2023-07-20 NOTE — Telephone Encounter (Signed)
Pt stated since his ablation he's been feeling terrible once a month and its so bad he can't go to work. He's having that feeling now and would like a callback to discuss further. Please advise

## 2023-07-22 ENCOUNTER — Ambulatory Visit: Payer: 59 | Admitting: Cardiology

## 2023-07-22 ENCOUNTER — Encounter: Payer: Self-pay | Admitting: Cardiology

## 2023-07-22 VITALS — BP 90/60 | HR 82 | Ht 71.0 in | Wt 296.2 lb

## 2023-07-22 DIAGNOSIS — I4819 Other persistent atrial fibrillation: Secondary | ICD-10-CM

## 2023-07-22 DIAGNOSIS — D6869 Other thrombophilia: Secondary | ICD-10-CM

## 2023-07-22 DIAGNOSIS — R5383 Other fatigue: Secondary | ICD-10-CM

## 2023-07-22 DIAGNOSIS — I480 Paroxysmal atrial fibrillation: Secondary | ICD-10-CM

## 2023-07-22 DIAGNOSIS — Z950 Presence of cardiac pacemaker: Secondary | ICD-10-CM

## 2023-07-22 DIAGNOSIS — I5042 Chronic combined systolic (congestive) and diastolic (congestive) heart failure: Secondary | ICD-10-CM

## 2023-07-22 LAB — CUP PACEART INCLINIC DEVICE CHECK
Date Time Interrogation Session: 20240830140051
Implantable Lead Connection Status: 753985
Implantable Lead Connection Status: 753985
Implantable Lead Connection Status: 753985
Implantable Lead Implant Date: 20200123
Implantable Lead Implant Date: 20200123
Implantable Lead Implant Date: 20200123
Implantable Lead Location: 753858
Implantable Lead Location: 753859
Implantable Lead Location: 753860
Implantable Lead Model: 4598
Implantable Lead Model: 5076
Implantable Lead Model: 5076
Implantable Pulse Generator Implant Date: 20200123

## 2023-07-22 NOTE — Patient Instructions (Addendum)
Medication Instructions:   Your physician recommends that you continue on your current medications as directed. Please refer to the Current Medication list given to you today.  *If you need a refill on your cardiac medications before your next appointment, please call your pharmacy*   Lab Work:  Your provider would like for you to have the following labs drawn: TODAY.  CBC/CMET/MAGNESIUM/TSH/T4   Please go to [facility] [address] ([facility] [address], [place] [address] You do not need an appointment.  They are open from 7:30 am-4 pm.  Lunch from 1:00 pm- 2:00 pm     If you have labs (blood work) drawn today and your tests are completely normal, you will receive your results only by: MyChart Message (if you have MyChart) OR A paper copy in the mail If you have any lab test that is abnormal or we need to change your treatment, we will call you to review the results.   Testing/Procedures: Your physician has requested that you have an echocardiogram. Echocardiography is a painless test that uses sound waves to create images of your heart. It provides your doctor with information about the size and shape of your heart and how well your heart's chambers and valves are working. This procedure takes approximately one hour. There are no restrictions for this procedure. Please do NOT wear cologne, perfume, aftershave, or lotions (deodorant is allowed). Please arrive 15 minutes prior to your appointment time.    Follow-Up: At [facility], you and your health needs are our priority.  As part of our continuing mission to provide you with exceptional heart care, we have created designated Provider Care Teams.  These Care Teams include your primary Cardiologist (physician) and Advanced Practice Providers (APPs -  Physician Assistants and Nurse Practitioners) who all work together to provide you with the care you need, when you need it.  We recommend  signing up for the patient portal called "MyChart".  Sign up information is provided on this After Visit Summary.  MyChart is used to connect with patients for Virtual Visits (Telemedicine).  Patients are able to view lab/test results, encounter notes, upcoming appointments, etc.  Non-urgent messages can be sent to your provider as well.   To learn more about what you can do with MyChart, go to [email or web address].     Your next appointment:    **Follow up with Bensimhon post echo in [place]  **Follow up with Dr. Lalla Brothers EP APP in 6 months in [place]  Provider:   You may see Lanier Prude, MD or one of the following Advanced Practice Providers on your designated Care Team:   Francis Dowse, [place] [address]" [place], [place] Sherie Don, NP Canary Brim, NP

## 2023-07-22 NOTE — Progress Notes (Addendum)
Cardiology Office Note Date:  07/22/2023  Patient ID:  Nicholas Foley, Nicholas Foley [birth year], MRN [id number] PCP:  Camie Patience, FNP  Cardiologist:  Lesleigh Noe, MD (Inactive) HF cardiologist: Dr. Gala Romney Electrophysiologist: Lanier Prude, MD    Chief Complaint: increased fatigue  History of Present Illness: Nicholas Foley is a 71 y.o. male with PMH notable for persis Afib, HFimpEF, s/p CRT-P, COPD, OSA, HTN; seen today for Lanier Prude, MD for acute visit due to fatigue and SOB episodes.   He is s/p AFib ablation w PVI and posterior wall on 12/2022.  He saw Dr. Lalla Brothers 03/2023 without recurrence of Afib. Amiodarone was lowered at that visit to 100mg  daily. He was c/o increased fatigue episodes about every other week. No arrhythmia corresponded to episodes.  He called clinic earlier this week c/o fatigue w SOB episodes every 2-3 weeks.   On follow-up today, patient states that he initially thought his fatigue episodes would improve after the ablation, but they have worsened. He continues to workout with trainer twice per week, but states that trainer has noticed his muscle strength is lower than previous.  He denies chest pain, palpitations, SOB. No increased edema - tends to hold fluid in abd. Abd today is soft. Good appetite, no bloating. He takes lasix daily + every other PM. He often takes PM lasix dose when he gets home from work ~5pm, and has disrupted sleep because of frequent urination. He follows with ICM RN, who has told him that his fluid status is stable as of the last few months.    Device Information: MDT CRT-P imp 11/2018; dx NICM, HFrEF  AAD History: Amiodarone   Past Medical History:  Diagnosis Date   Atrial fibrillation with rapid ventricular response (HCC) 08/02/2013   CHF (congestive heart failure) (HCC)    Chronic systolic dysfunction of left ventricle 08/02/2013   BNP greater than 2000    COPD (chronic obstructive pulmonary disease) (HCC)     Moderate by PFTs with response to bronchodilators, May 2014   Erectile dysfunction    Hypertension    Insomnia 08/02/13   Left bundle branch block 08/02/2013   Morbid obesity (HCC) 08/02/2013   OSA (obstructive sleep apnea)    severe OSA with AHI 74/hr now on CPAP at 8cm H2O    Past Surgical History:  Procedure Laterality Date   ATRIAL FIBRILLATION ABLATION N/A 12/30/2022   Procedure: ATRIAL FIBRILLATION ABLATION;  Surgeon: Lanier Prude, MD;  Location: [facility] IN[facility];  Service: Cardiovascular;  Laterality: N/A;   BIV PACEMAKER INSERTION CRT-P N/A 12/14/2018   Medtronic model [id number] Percepta Quad CRT-P MRI Surescan (serial Number [id number]) device implanted by Dr Johney Frame for CHF and LBBB   CARDIOVERSION N/A 08/06/2013   Procedure: CARDIOVERSION;  Surgeon: Lewayne Bunting, MD;  Location: [facility] ENDOSCOPY;  Service: Cardiovascular;  Laterality: N/A;  spoke with [name]    CARDIOVERSION N/A 09/03/2013   Procedure: CARDIOVERSION;  Surgeon: Lesleigh Noe, MD;  Location: [facility] ENDOSCOPY;  Service: Cardiovascular;  Laterality: N/A;   CARDIOVERSION N/A 07/21/2021   Procedure: CARDIOVERSION;  Surgeon: Laurey Morale, MD;  Location: [facility] ENDOSCOPY;  Service: Cardiovascular;  Laterality: N/A;   CARDIOVERSION N/A 08/13/2022   Procedure: CARDIOVERSION;  Surgeon: Wendall Stade, MD;  Location: [facility] ENDOSCOPY;  Service: Cardiovascular;  Laterality: N/A;   CARDIOVERSION N/A 09/03/2022   Procedure: CARDIOVERSION;  Surgeon: Dolores Patty, MD;  Location: [facility] ENDOSCOPY;  Service: Cardiovascular;  Laterality: N/A;  CARDIOVERSION N/A 09/28/2022   Procedure: CARDIOVERSION;  Surgeon: Regan Lemming, MD;  Location: [facility] INV[facility] CV LAB;  Service: Cardiovascular;  Laterality: N/A;   chronic systolic dysfu     NASAL SEPTUM SURGERY     RIGHT/LEFT HEART CATH AND CORONARY ANGIOGRAPHY N/A 12/04/2018   Procedure: RIGHT/LEFT HEART CATH AND CORONARY ANGIOGRAPHY;  Surgeon: Lyn Records, MD;  [facility]: [facility] INV[facility]  CV LAB;  Service: Cardiovascular;  Laterality: N/A;   TEE WITHOUT CARDIOVERSION N/A 08/06/2013   Procedure: TRANSESOPHAGEAL ECHOCARDIOGRAM (TEE);  Surgeon: Lewayne Bunting, MD;  Location: [facility] ENDOSCOPY;  Service: Cardiovascular;  Laterality: N/A;   TEE WITHOUT CARDIOVERSION N/A 12/30/2022   Procedure: TRANSESOPHAGEAL ECHOCARDIOGRAM (TEE);  Surgeon: Dolores Patty, MD;  Location: [facility] INVASIVE CV LAB;  Service: Cardiovascular;  Laterality: N/A;   VASECTOMY      Current Outpatient Medications  Medication Instructions   acetaminophen (TYLENOL) 1,300 mg, Oral, 2 times daily, Arthritis strength   ADVAIR DISKUS 250-50 MCG/DOSE AEPB 1 puff, Inhalation, 2 times daily   amiodarone (PACERONE) 100 mg, Oral, Daily   apixaban (ELIQUIS) 5 mg, Oral, 2 times daily, NEEDS FOLLOW UP APPOINTMENT FOR MORE REFILL S   carvedilol (COREG) 25 mg, Oral, 2 times daily with meals   cholecalciferol (VITAMIN D3) 1,000 Units, Oral, Daily   Cialis 20 mg, Oral, Daily PRN   colchicine 0.6 mg, Oral, Daily   cyanocobalamin (VITAMIN B12) 1,000 mcg, Oral, Daily   cyclobenzaprine (FLEXERIL) 10 mg, Oral, 3 times daily   empagliflozin (JARDIANCE) 10 mg, Oral, Daily before breakfast   fluticasone (FLONASE) 50 MCG/ACT nasal spray 1 spray, Each Nare, 2 times daily   furosemide (LASIX) 80 mg, Oral, 2 times daily   metolazone (ZAROXOLYN) 2.5 mg, Oral,  Once, Take 1 tablet today with extra of 40 meq of Potassium   Multiple Vitamins-Minerals (PRESERVISION AREDS PO) 1 capsule, Oral, 2 times daily   omeprazole (PRILOSEC OTC) 20 mg, Oral, Daily   pantoprazole (PROTONIX) 40 mg, Oral, Daily   potassium chloride SA (KLOR-CON M) 20 MEQ tablet 20 mEq, Oral, 2 times daily   sacubitril-valsartan (ENTRESTO) 24-26 MG 1 tablet, Oral, 2 times daily   spironolactone (ALDACTONE) 25 mg, Oral, Daily   tamsulosin (FLOMAX) 0.4 mg, Oral, Daily   zolpidem (AMBIEN CR) 12.5 mg, Oral, At bedtime PRN    Social History:  The patient  reports that he quit  smoking about 10 years ago. His smoking use included cigarettes and cigars. He started smoking about 54 years ago. He has a 64.5 pack-year smoking history. He has never used smokeless tobacco. He reports current alcohol use. He reports that he does not use drugs.   Family History:   The patient's family history includes Atrial fibrillation in his mother; COPD in his father; Cerebral aneurysm in his brother; Healthy in his daughter.  ROS:  Please see the history of present illness. All other systems are reviewed and otherwise negative.   PHYSICAL EXAM:  VS:  BP 90/60 (BP Location: Left Arm, Patient Position: Sitting, Cuff Size: Normal) Comment: Before EKG  Pulse 82   Ht 5\' 11"  (1.803 m)   Wt 296 lb 4 oz (134.4 kg)   SpO2 98%   BMI 41.32 kg/m  BMI: Body mass index is 41.32 kg/m.  Today's Vitals   07/22/23 1027 07/22/23 1037  BP: 97/67 90/60  Pulse: 82   SpO2: 98%   Weight: 296 lb 4 oz (134.4 kg)   Height: 5\' 11"  (1.803 m)  Body mass index is 41.32 kg/m.   GEN- The patient is well appearing, alert and oriented x 3 today.   Lungs- Clear to ausculation bilaterally, normal work of breathing.  Heart- Regular rate and rhythm, no murmurs, rubs or gallops Extremities- Trace peripheral edema, warm, dry Skin-  device pocket well-healed, no tethering   Device interrogation done today and reviewed by myself:  Battery 4 years Lead thresholds, impedence, sensing stable  Chronically elevated LV threshold, stable VP 98% Two AF episodes on 03/17/23, longest 1.5hours Overall AF burden low No changes made today  EKG is ordered. Personal review of EKG from today shows:    EKG Interpretation Date/Time:  Friday July 22 2023 10:32:56 EDT Ventricular Rate:  82 PR Interval:  210 QRS Duration:  112 QT Interval:  374 QTC Calculation: 436 R Axis:   249  Text Interpretation: Atrial-sensed ventricular-paced rhythm with prolonged AV conduction Confirmed by Sherie Don [phone]) on 07/22/2023  10:35:01 AM    Recent Labs: 11/18/2022: B Natriuretic Peptide 41.7 12/08/2022: Hemoglobin 14.0; Platelets 238 06/08/2023: ALT 15; BUN 33; Creatinine, Ser 2.03; Potassium 4.9; Sodium 136; TSH 1.540  No results found for requested labs within last 365 days.   CrCl cannot be calculated (Patient's most recent lab result is older than the maximum 21 days allowed.).   Wt Readings from Last 3 Encounters:  04/20/23 300 lb (136.1 kg)  01/27/23 (!) 303 lb 3.2 oz (137.5 kg)  01/03/23 (!) 304 lb (137.9 kg)     Additional studies reviewed include: Previous EP, cardiology notes.   TEE, 01/05/2023  1. Left ventricular ejection fraction, by estimation, is 50 to 55%. The left ventricle has low normal function.   2. Right ventricular systolic function is low normal. The right ventricular size is mildly enlarged.   3. Left atrial size was mildly dilated. No left atrial/left atrial appendage thrombus was detected.   4. Right atrial size was mildly dilated.   5. Moderate pericardial effusion. The pericardial effusion is anterior to the right ventricle.   6. The mitral valve is grossly normal. Trivial mitral valve regurgitation. No evidence of mitral stenosis.   7. The aortic valve is tricuspid. Aortic valve regurgitation is not visualized. No aortic stenosis is present.   8. Aortic dilatation noted. There is moderate dilatation of the aortic root, measuring 39 mm.   TTE, 04/12/2022  1. There is significant LV dyysynchrony. Left ventricular ejection fraction, by estimation, is 50 to 55%. The left ventricle has low normal function. The left ventricle has no regional wall motion abnormalities. There is moderate concentric left ventricular hypertrophy. Left ventricular diastolic parameters are consistent with Grade I diastolic dysfunction (impaired relaxation).   2. Right ventricular systolic function is normal. The right ventricular size is normal.   3. Left atrial size was mildly dilated.   4. The mitral valve  is normal in structure. No evidence of mitral valve regurgitation. No evidence of mitral stenosis.   5. The aortic valve is tricuspid. There is mild calcification of the aortic valve. Aortic valve regurgitation is not visualized. No aortic stenosis is present.   6. The inferior vena cava is normal in size with greater than 50% respiratory variability, suggesting right atrial pressure of 3 mmHg.    ASSESSMENT AND PLAN:  #) persis AFib S/p ablation 12/2022 On amiodarone 100mg  daily Thyroid and LFTs normal on 7/17 labs Rare AF episodes by device  #) Hypercoag d/t persis afib CHA2DS2-VASc Score = 4 [CHF History: 1, HTN History: 1,  Diabetes History: 0, Stroke History: 0, Vascular Disease History: 1, Age Score: 1, Gender Score: 0].  Therefore, the patient's annual risk of stroke is 4.8 %.    Stroke ppx - 5mg  eliquis BID, appropriately dosed No bleeding concerns  #) HFimpEF #) Medtronic CRT-P in situ NYHA II symptoms Warm and dry on exam, weight stable High VP GDMT: coreg, entresto 24-26, spiro, jardiance Diuretic: lasix 40mg  in AM + 40mg  every other PM   #) fatigue  No obvious cause of patient's increased fatigue AF burden remains low Will update CBC, electrolyes, thyroid function labs today Update echo to further eval Recommended to follow-up with PCP for ongoing workup  #) OSA On cpap, recommend nightly use      Current medicines are reviewed at length with the patient today.   The patient does not have concerns regarding his medicines.  The following changes were made today:  none  Labs/ tests ordered today include:  Orders Placed This Encounter  Procedures   CBC   Comp Met (CMET)   Magnesium   TSH   T4   EKG 12-Lead   ECHOCARDIOGRAM COMPLETE     Disposition: Follow up with Dr. Lalla Brothers or EP APP in 6 months   Signed, Sherie Don, NP  07/22/23  7:43 AM  Electrophysiology [facility]

## 2023-07-23 LAB — COMPREHENSIVE METABOLIC PANEL
ALT: 18 IU/L (ref 0–44)
AST: 17 IU/L (ref 0–40)
Albumin: 4.6 g/dL (ref 3.9–4.9)
Alkaline Phosphatase: 95 IU/L (ref 44–121)
BUN/Creatinine Ratio: 18 (ref 10–24)
BUN: 34 mg/dL — ABNORMAL HIGH (ref 8–27)
Bilirubin Total: 0.3 mg/dL (ref 0.0–1.2)
CO2: 19 mmol/L — ABNORMAL LOW (ref 20–29)
Calcium: 9.4 mg/dL (ref 8.6–10.2)
Chloride: 106 mmol/L (ref 96–106)
Creatinine, Ser: 1.86 mg/dL — ABNORMAL HIGH (ref 0.76–1.27)
Globulin, Total: 2.9 g/dL (ref 1.5–4.5)
Glucose: 100 mg/dL — ABNORMAL HIGH (ref 70–99)
Potassium: 4.4 mmol/L (ref 3.5–5.2)
Sodium: 129 mmol/L — ABNORMAL LOW (ref 134–144)
Total Protein: 7.5 g/dL (ref 6.0–8.5)
eGFR: 38 mL/min/{1.73_m2} — ABNORMAL LOW (ref >59)

## 2023-07-23 LAB — MAGNESIUM: Magnesium: 2.3 mg/dL (ref 1.6–2.3)

## 2023-07-23 LAB — CBC
Hematocrit: 39.7 % (ref 37.5–51.0)
Hemoglobin: 13.2 g/dL (ref 13.0–17.7)
MCH: 30.8 pg (ref 26.6–33.0)
MCHC: 33.2 g/dL (ref 31.5–35.7)
MCV: 93 fL (ref 79–97)
Platelets: 247 10*3/uL (ref 150–450)
RBC: 4.28 x10E6/uL (ref 4.14–5.80)
RDW: 12.8 % (ref 11.6–15.4)
WBC: 7.6 10*3/uL (ref 3.4–10.8)

## 2023-07-23 LAB — T4: T4, Total: 6.2 ug/dL (ref 4.5–12.0)

## 2023-07-23 LAB — TSH: TSH: 2.57 u[IU]/mL (ref 0.450–4.500)

## 2023-07-28 NOTE — Progress Notes (Signed)
Remote pacemaker transmission.   

## 2023-07-29 ENCOUNTER — Telehealth: Payer: Self-pay | Admitting: *Deleted

## 2023-08-01 ENCOUNTER — Ambulatory Visit: Payer: 59 | Attending: Cardiology

## 2023-08-01 DIAGNOSIS — I5042 Chronic combined systolic (congestive) and diastolic (congestive) heart failure: Secondary | ICD-10-CM

## 2023-08-01 DIAGNOSIS — Z950 Presence of cardiac pacemaker: Secondary | ICD-10-CM

## 2023-08-02 MED ORDER — METOLAZONE 2.5 MG PO TABS: 2.5000 mg | ORAL_TABLET | ORAL | 0 refills | Status: DC

## 2023-08-02 NOTE — Progress Notes (Signed)
EPIC Encounter for ICM Monitoring  Patient Name: Nicholas Foley is a 72 y.o. male Date: 08/02/2023 Primary Care Physican: Camie Patience, FNP Primary Cardiologist: Smith/Bensimhon Electrophysiologist: Lalla Brothers Nephrologist:  [facility]: Dr Valentino Nose Bi-V Pacing:  98.3% 01/03/2023 Weight: 299 lbs 01/05/2023 Weight: 297 lbs 02/09/2023 Weight: 296 lbs 03/15/2023 Weight: 292 lbs 08/02/2023 Weight: 291 lbs    Clinical Status Since 22-Jul-2023 Time in AT/AF  0.0 hr/day (0.0%)         Spoke with patient and heart failure questions reviewed.  Transmission results reviewed.  Pt reports on several episodes of fatigue and SOB that comes on suddenly every couple of weeks.  He followed up with Dr Lalla Brothers and PCP but no problems found.  He said there have been several changes in his meds recently and thinks it may because he is taking lower dosage Entresto.           Optivol thoracic impedance suggesting possible fluid accumulation starting 9/3.      Prescribed: Furosemide 40 mg take 2 tablets (80 mg total) twice a day.  Pt reports 8/5 taking differently:  1 Tablet every morning and 1 tablet every other evening (changed by physician) Potassium 20 mEq take 1 tablet twice a day  Metolazone 2.5 mg take 1 tablet for 1 dose with 40 mEq of Potassium   Labs: 07/22/2023 Creatinine 1.86, BUN 34, Potassium 4.4, Sodium 129, GFR 38 06/08/2023 Creatinine 2.03, BUN 33, Potassium 4.9, Sodium 136, GFR 35 01/03/2023 Creatinine 1.75, BUN 23, Potassium 3.8, Sodium 137, GFR 42  12/08/2022 Creatinine 1.84, BUN 32, Potassium 4.2, Sodium 137  A complete set of results can be found in Results Review.   Recommendations:  No changes and encouraged to call if experiencing any fluid symptoms.  Discussed limiting fluid intake to 64 oz daily and thinks there are days he is drinking more than the recommended amount.     Follow-up plan: ICM clinic phone appointment on 08/08/2023 to recheck fluid accumulation.   91 day  device clinic remote transmission 10/19/2023.     EP/Cardiology Office Visits:   10/26/2023 with Dr Lalla Brothers.   09/20/2023 with Dr Gala Romney.     Copy of ICM check sent to Dr Lalla Brothers.   3 month ICM trend: 08/01/2023.    12-14 Month ICM trend:     Karie Soda, RN 08/02/2023 1:12 PM

## 2023-08-02 NOTE — Telephone Encounter (Signed)
Meds ordered this encounter  Medications   metolazone (ZAROXOLYN) 2.5 MG tablet    Sig: Take 1 tablet (2.5 mg total) by mouth as directed. By the HF Clinic    Dispense:  4 tablet    Refill:  0

## 2023-08-04 ENCOUNTER — Other Ambulatory Visit (HOSPITAL_COMMUNITY): Payer: Self-pay | Admitting: Family Medicine

## 2023-08-04 ENCOUNTER — Other Ambulatory Visit (HOSPITAL_COMMUNITY): Payer: Self-pay | Admitting: Internal Medicine

## 2023-08-08 ENCOUNTER — Ambulatory Visit: Payer: 59 | Attending: Cardiology

## 2023-08-08 DIAGNOSIS — I5042 Chronic combined systolic (congestive) and diastolic (congestive) heart failure: Secondary | ICD-10-CM

## 2023-08-08 DIAGNOSIS — Z950 Presence of cardiac pacemaker: Secondary | ICD-10-CM

## 2023-08-09 ENCOUNTER — Telehealth: Payer: Self-pay | Admitting: Neurology

## 2023-08-09 ENCOUNTER — Ambulatory Visit: Payer: Medicare Other | Admitting: Neurology

## 2023-08-09 ENCOUNTER — Encounter: Payer: Self-pay | Admitting: Neurology

## 2023-08-09 NOTE — Telephone Encounter (Signed)
Pt cancelled appt due to testing positive for COVID. Pt decided to not reschedule due to needing a sooner appt than December.

## 2023-08-09 NOTE — Progress Notes (Signed)
EPIC Encounter for ICM Monitoring  Patient Name: Nicholas Foley is a 72 y.o. male Date: 08/09/2023 Primary Care Physican: Camie Patience, FNP Primary Cardiologist: Smith/Bensimhon Electrophysiologist: Lalla Brothers Nephrologist: [facility]: Dr Valentino Nose Bi-V Pacing:  98.3% 01/03/2023 Weight: 299 lbs 01/05/2023 Weight: 297 lbs 02/09/2023 Weight: 296 lbs 03/15/2023 Weight: 292 lbs 08/02/2023 Weight: 291 lbs    Clinical Status Since 31-Jul-2023 Time in AT/AF  0.0 hr/day (0.0%)         Spoke with patient and heart failure questions reviewed.  Transmission results reviewed.  Pt reports he and his wife have Covid and not feeling well.         Optivol thoracic impedance suggesting possible fluid accumulation starting 9/3.      Prescribed: Furosemide 40 mg take 2 tablets (80 mg total) twice a day.  Pt reports 8/5 taking differently:  1 Tablet every morning and 1 tablet every other evening (changed by physician) Potassium 20 mEq take 1 tablet twice a day  Metolazone 2.5 mg take 1 tablet for 1 dose with 40 mEq of Potassium   Labs: 07/22/2023 Creatinine 1.86, BUN 34, Potassium 4.4, Sodium 129, GFR 38 06/08/2023 Creatinine 2.03, BUN 33, Potassium 4.9, Sodium 136, GFR 35 01/03/2023 Creatinine 1.75, BUN 23, Potassium 3.8, Sodium 137, GFR 42  12/08/2022 Creatinine 1.84, BUN 32, Potassium 4.2, Sodium 137  A complete set of results can be found in Results Review.   Recommendations:  No changes and he will try to work on getting rid of the fluid.     Follow-up plan: ICM clinic phone appointment on 08/16/2023 to recheck fluid accumulation.   91 day device clinic remote transmission 10/19/2023.     EP/Cardiology Office Visits:   10/26/2023 with Dr Lalla Brothers.   09/20/2023 with Dr Gala Romney.     Copy of ICM check sent to Dr Lalla Brothers.   3 month ICM trend: 08/07/2023.    12-14 Month ICM trend:     Karie Soda, RN 08/09/2023 10:52 AM

## 2023-08-11 ENCOUNTER — Encounter: Payer: Self-pay | Admitting: Neurology

## 2023-08-16 ENCOUNTER — Ambulatory Visit: Payer: 59 | Attending: Cardiology

## 2023-08-16 DIAGNOSIS — Z950 Presence of cardiac pacemaker: Secondary | ICD-10-CM

## 2023-08-16 DIAGNOSIS — I5042 Chronic combined systolic (congestive) and diastolic (congestive) heart failure: Secondary | ICD-10-CM

## 2023-08-17 NOTE — Progress Notes (Signed)
EPIC Encounter for ICM Monitoring  Patient Name: Nicholas Foley is a 72 y.o. male Date: 08/17/2023 Primary Care Physican: Camie Patience, FNP rimary Cardiologist: Smith/Bensimhon Electrophysiologist: Lalla Brothers Nephrologist: Rob[facility]: Dr Valentino Nose Bi-V Pacing:  98.3% 01/03/2023 Weight: 299 lbs 01/05/2023 Weight: 297 lbs 02/09/2023 Weight: 296 lbs 03/15/2023 Weight: 292 lbs 08/02/2023 Weight: 291 lbs    Clinical Status Since 07-Aug-2023 Time in AT/AF  0.0 hr/day (0.0%)         Spoke with patient and heart failure questions reviewed.  Transmission results reviewed.  Pt reports he has recovered from covid and back at work.          Optivol thoracic impedance suggesting fluid levels returned to normal.      Prescribed: Furosemide 40 mg take 2 tablets (80 mg total) twice a day.  Pt reports 8/5 taking differently:  1 Tablet every morning and 1 tablet every other evening (changed by physician) Potassium 20 mEq take 1 tablet twice a day  Metolazone 2.5 mg take 1 tablet for 1 dose with 40 mEq of Potassium   Labs: 07/22/2023 Creatinine 1.86, BUN 34, Potassium 4.4, Sodium 129, GFR 38 06/08/2023 Creatinine 2.03, BUN 33, Potassium 4.9, Sodium 136, GFR 35 01/03/2023 Creatinine 1.75, BUN 23, Potassium 3.8, Sodium 137, GFR 42  12/08/2022 Creatinine 1.84, BUN 32, Potassium 4.2, Sodium 137  A complete set of results can be found in Results Review.   Recommendations:  No changes and encouraged to call if experiencing any fluid symptoms.   Follow-up plan: ICM clinic phone appointment on 09/05/2023.   91 day device clinic remote transmission 10/19/2023.     EP/Cardiology Office Visits:   10/26/2023 with Dr Lalla Brothers.   09/20/2023 with Dr Gala Romney.     Copy of ICM check sent to Dr Lalla Brothers.   3 month ICM trend: 08/15/2023.    12-14 Month ICM trend:     Karie Soda, RN 08/17/2023 12:11 PM

## 2023-08-22 ENCOUNTER — Ambulatory Visit (INDEPENDENT_AMBULATORY_CARE_PROVIDER_SITE_OTHER): Payer: 59

## 2023-08-22 ENCOUNTER — Ambulatory Visit (INDEPENDENT_AMBULATORY_CARE_PROVIDER_SITE_OTHER): Payer: 59 | Admitting: Podiatry

## 2023-08-22 DIAGNOSIS — M7742 Metatarsalgia, left foot: Secondary | ICD-10-CM | POA: Diagnosis not present

## 2023-08-22 DIAGNOSIS — M79671 Pain in right foot: Secondary | ICD-10-CM

## 2023-08-22 DIAGNOSIS — M79672 Pain in left foot: Secondary | ICD-10-CM | POA: Diagnosis not present

## 2023-08-22 DIAGNOSIS — M7741 Metatarsalgia, right foot: Secondary | ICD-10-CM | POA: Diagnosis not present

## 2023-08-22 NOTE — Progress Notes (Signed)
Chief Complaint  Patient presents with   Foot Pain    BILAT FOOT PAIN BEEN HAVING THE PAIN FOR 1.5 YR. PAIN IS AT BALL OF FOOT UP TO THE TOES, PCP HAD HIM TRY PAIN CREAM AND THAT DIDN'T HELP     HPI: 72 y.o. male presenting today as a new patient referral from his PCP for evaluation of generalized bilateral foot pain and achiness ongoing for few years.  Denies a history of injury.  Gradual onset.  Past Medical History:  Diagnosis Date   Atrial fibrillation with rapid ventricular response (HCC) 08/02/2013   CHF (congestive heart failure) (HCC)    Chronic systolic dysfunction of left ventricle 08/02/2013   BNP greater than 2000    COPD (chronic obstructive pulmonary disease) (HCC)    Moderate by PFTs with response to bronchodilators, May 2014   Erectile dysfunction    Hypertension    Insomnia 08/02/13   Left bundle branch block 08/02/2013   Morbid obesity (HCC) 08/02/2013   OSA (obstructive sleep apnea)    severe OSA with AHI 74/hr now on CPAP at 8cm H2O    Past Surgical History:  Procedure Laterality Date   ATRIAL FIBRILLATION ABLATION N/A 12/30/2022   Procedure: ATRIAL FIBRILLATION ABLATION;  Surgeon: Lanier Prude, MD;  Location: [facility] INVASIVE CV LAB;  Service: Cardiovascular;  Laterality: N/A;   BIV PACEMAKER INSERTION CRT-P N/A 12/14/2018   Medtronic model [id number] Percepta Quad CRT-P MRI Surescan (serial Number [id number]) device implanted by Dr Johney Frame for CHF and LBBB   CARDIOVERSION N/A 08/06/2013   Procedure: CARDIOVERSION;  Surgeon: Lewayne Bunting, MD;  Location: [facility] ENDOSCOPY;  Service: Cardiovascular;  Laterality: N/A;  spoke with [name]    CARDIOVERSION N/A 09/03/2013   Procedure: CARDIOVERSION;  Surgeon: Lesleigh Noe, MD;  Location: [facility] ENDOSCOPY;  Service: Cardiovascular;  Laterality: N/A;   CARDIOVERSION N/A 07/21/2021   Procedure: CARDIOVERSION;  Surgeon: Laurey Morale, MD;  Location: [facility] ENDOSCOPY;  Service: Cardiovascular;  Laterality: N/A;   CARDIOVERSION  N/A 08/13/2022   Procedure: CARDIOVERSION;  Surgeon: Wendall Stade, MD;  Location: [facility] ENDOSCOPY;  Service: Cardiovascular;  Laterality: N/A;   CARDIOVERSION N/A 09/03/2022   Procedure: CARDIOVERSION;  Surgeon: Dolores Patty, MD;  Location: [facility] ENDOSCOPY;  Service: Cardiovascular;  Laterality: N/A;   CARDIOVERSION N/A 09/28/2022   Procedure: CARDIOVERSION;  Surgeon: Regan Lemming, MD;  Location: [facility] INVASIVE CV LAB;  Service: Cardiovascular;  Laterality: N/A;   chronic systolic dysfu     NASAL SEPTUM SURGERY     RIGHT/LEFT HEART CATH AND CORONARY ANGIOGRAPHY N/A 12/04/2018   Procedure: RIGHT/LEFT HEART CATH AND CORONARY ANGIOGRAPHY;  Surgeon: Lyn Records, MD;  Location: [facility] INVASIVE CV LAB;  Service: Cardiovascular;  Laterality: N/A;   TEE WITHOUT CARDIOVERSION N/A 08/06/2013   Procedure: TRANSESOPHAGEAL ECHOCARDIOGRAM (TEE);  Surgeon: Lewayne Bunting, MD;  Location: [facility] ENDOSCOPY;  Service: Cardiovascular;  Laterality: N/A;   TEE WITHOUT CARDIOVERSION N/A 12/30/2022   Procedure: TRANSESOPHAGEAL ECHOCARDIOGRAM (TEE);  Surgeon: Dolores Patty, MD;  Location: [facility] INVASIVE CV LAB;  Service: Cardiovascular;  Laterality: N/A;   VASECTOMY      Allergies  Allergen Reactions   Ace Inhibitors Cough   Angiotensin Receptor Blockers Cough     Physical Exam: General: The patient is alert and oriented x3 in no acute distress.  Dermatology: Skin is warm, dry and supple bilateral lower extremities.   Vascular: Palpable pedal pulses bilaterally. Capillary refill within normal limits.  No appreciable edema.  No erythema.  Neurological: Grossly intact via light touch  Musculoskeletal Exam: No pedal deformities noted  Radiographic Exam B/L feet 08/22/2023:  Normal osseous mineralization. Joint spaces preserved with exception of some mild degenerative changes noted to the first MTP of the right foot consistent with arthritis.  No fractures or osseous irregularities noted.  Assessment/Plan  of Care: 1.  Generalized bilateral forefoot pain with paresthesia to the toes 2.  Peripheral polyneuropathy bilateral toes  -Patient evaluated.  X-rays reviewed -Patient does admit to walking around the house barefoot.  Advised against this.  Recommend good supportive shoes and sneakers even around the house.  This may help alleviate a significant portion of the patient's pain and even resolve some of his paresthesia symptoms to the toes -Patient has a history of wearing hard leather loafers.  Advised against this. -Return to clinic as needed       Felecia Shelling, DPM [facility]  Dr. Felecia Shelling, DPM    [address] [place], [place] [address]                Office [phone]  Fax [phone]

## 2023-08-29 ENCOUNTER — Telehealth: Payer: Self-pay | Admitting: Cardiology

## 2023-08-29 DIAGNOSIS — I10 Essential (primary) hypertension: Secondary | ICD-10-CM

## 2023-08-29 DIAGNOSIS — G4733 Obstructive sleep apnea (adult) (pediatric): Secondary | ICD-10-CM

## 2023-08-29 DIAGNOSIS — R0683 Snoring: Secondary | ICD-10-CM

## 2023-08-29 NOTE — Telephone Encounter (Signed)
  Pt said, he needed a new prescription for new cpap since its been 5 years. He would like to get before the end of this month because his insurance will change and he can only use it by 10/31 and since he already paid all his deductible for the year, it will be helpful if he can get his cpap before 10/31

## 2023-08-31 ENCOUNTER — Encounter: Payer: Self-pay | Admitting: Neurology

## 2023-08-31 ENCOUNTER — Other Ambulatory Visit (INDEPENDENT_AMBULATORY_CARE_PROVIDER_SITE_OTHER): Payer: 59

## 2023-08-31 ENCOUNTER — Ambulatory Visit (INDEPENDENT_AMBULATORY_CARE_PROVIDER_SITE_OTHER): Payer: 59 | Admitting: Neurology

## 2023-08-31 VITALS — BP 113/77 | HR 83 | Ht 71.0 in | Wt 298.0 lb

## 2023-08-31 DIAGNOSIS — R202 Paresthesia of skin: Secondary | ICD-10-CM

## 2023-08-31 DIAGNOSIS — G629 Polyneuropathy, unspecified: Secondary | ICD-10-CM | POA: Diagnosis not present

## 2023-08-31 LAB — B12 AND FOLATE PANEL
Folate: 15.5 ng/mL (ref >5.9)
Vitamin B-12: 1345 pg/mL — ABNORMAL HIGH (ref 211–911)

## 2023-08-31 MED ORDER — GABAPENTIN 100 MG PO CAPS: ORAL_CAPSULE | ORAL | 3 refills | Status: DC

## 2023-08-31 NOTE — Telephone Encounter (Signed)
Patient is calling to follow up. Patient is requesting a call back with an update. Please advise.

## 2023-08-31 NOTE — Patient Instructions (Addendum)
Nerve testing of the leg Check labs Start gabapentin 100mg  at bedtime x 2 weeks, then increase to 100mg  twice daily  ELECTROMYOGRAM AND NERVE CONDUCTION STUDIES (EMG/NCS) INSTRUCTIONS  How to Prepare The neurologist conducting the EMG will need to know if you have certain medical conditions. Tell the neurologist and other EMG lab personnel if you: Have a pacemaker or any other electrical medical device Take blood-thinning medications Have hemophilia, a blood-clotting disorder that causes prolonged bleeding Bathing Take a shower or bath shortly before your exam in order to remove oils from your skin. Don't apply lotions or creams before the exam.  What to Expect You'll likely be asked to change into a hospital gown for the procedure and lie down on an examination table. The following explanations can help you understand what will happen during the exam.  Electrodes. The neurologist or a technician places surface electrodes at various locations on your skin depending on where you're experiencing symptoms. Or the neurologist may insert needle electrodes at different sites depending on your symptoms.  Sensations. The electrodes will at times transmit a tiny electrical current that you may feel as a twinge or spasm. The needle electrode may cause discomfort or pain that usually ends shortly after the needle is removed. If you are concerned about discomfort or pain, you may want to talk to the neurologist about taking a short break during the exam.  Instructions. During the needle EMG, the neurologist will assess whether there is any spontaneous electrical activity when the muscle is at rest - activity that isn't present in healthy muscle tissue - and the degree of activity when you slightly contract the muscle.  He or she will give you instructions on resting and contracting a muscle at appropriate times. Depending on what muscles and nerves the neurologist is examining, he or she may ask you to change  positions during the exam.  After your EMG You may experience some temporary, minor bruising where the needle electrode was inserted into your muscle. This bruising should fade within several days. If it persists, contact your primary care doctor.

## 2023-08-31 NOTE — Progress Notes (Signed)
[facility] Clinic Note - Initial Visit   Date: 08/31/2023   KACEY KABEL MRN: [id number] DOB: 05-08-1951   Dear Jorge Ny, FNP:  Thank you for your kind referral of PRIYANSH TRAYER for consultation of neuropathy. Although his history is well known to you, please allow [place] to reiterate it for the purpose of our medical record. The patient was accompanied to the clinic by self.    LEWEY CHEEVES is a 72 y.o. right-handed male with OSA on CPAP, hypertension, atrial fibrillation, CHF, COPD, CKD, and OSA presenting for evaluation of neuropathy.   IMPRESSION/PLAN: Bilateral feet paresthesias, most suggestive of peripheral neuropathy.  No risk factors. His neurological examination shows a distal predominant large fiber peripheral neuropathy. I had extensive discussion with the patient regarding the pathogenesis, etiology, management, and natural course of neuropathy. Neuropathy tends to be slowly progressive, especially if a treatable etiology is not identified.  I would like to test for treatable causes of neuropathy. Management is symptomatic.  - NCS/EMG of the legs - Check vitamin B12, folate, vitamin B1, copper, SPEP with IFE - Start gabapentin 100mg  at bedtime x 2 weeks, then increase to 100mg  twice daily   Return to clinic in 4 months  ------------------------------------------------------------- History of present illness: Starting around 2022, he began having numbness in the balls of the feet and toes.  It now involves the midfoot and also has shooting pain associated with it.  No falls or imbalance. No low back or radicular pain.  He denies weakness.    Nonsmoker.  He rarely drinks alcohol.  No exposure to chemotherapy.  No family history of neuropathy.   In 2017, he was seen by me for bilateral hand numbness and found to have sensorimotor polyneuropathy with demyelinating and axonal changes.  He no longer has hand paresthesias.    Out-side paper  records, electronic medical record, and images have been reviewed where available and summarized as:  MRI cervical spine 8/24/20117: 1. At C4-5 there is a mild broad-based disc bulge. Mild bilateral foraminal narrowing. 2. At C5-6 there is a mild broad-based disc bulge. Left uncovertebral degenerative change resulting in moderate left foraminal narrowing. 3. At C6-7 there is a mild broad-based disc bulge. Left uncovertebral degenerative change. Mild left foraminal stenosis.   NCS/EMG of the legs 9/21/20217: This is a normal study of the lower extremities. In particular, there is no evidence of a sensorimotor polyneuropathy or lumbosacral radiculopathy.   NCS/EMG of the arms 07/22/2016: The electrophysiologic studies are most consistent with a subacute, symmetric sensorimotor polyneuropathy, demyelinating and axon loss in type, affecting the upper extremities. Overall, these findings are moderate in degree electrically.     Mononeuritis multiplex is considered and additional electrodiagnostic testing of the lower extremities may be indicated. Clinical correlation recommended.   Lab Results  Component Value Date   HGBA1C 5.4 08/27/2020   Lab Results  Component Value Date   VITAMINB12 327 08/06/2016   Lab Results  Component Value Date   TSH 2.570 07/22/2023   Lab Results  Component Value Date   ESRSEDRATE 10 08/06/2016    Past Medical History:  Diagnosis Date   Atrial fibrillation with rapid ventricular response (HCC) 08/02/2013   CHF (congestive heart failure) (HCC)    Chronic systolic dysfunction of left ventricle 08/02/2013   BNP greater than 2000    COPD (chronic obstructive pulmonary disease) (HCC)    Moderate by PFTs with response to bronchodilators, May 2014   Erectile dysfunction  Hypertension    Insomnia 08/02/13   Left bundle branch block 08/02/2013   Morbid obesity (HCC) 08/02/2013   OSA (obstructive sleep apnea)    severe OSA with AHI 74/hr now on CPAP at 8cm H2O     Past Surgical History:  Procedure Laterality Date   ATRIAL FIBRILLATION ABLATION N/A 12/30/2022   Procedure: ATRIAL FIBRILLATION ABLATION;  Surgeon: Lanier Prude, MD;  Location: [facility] INVASIVE CV LAB;  Service: Cardiovascular;  Laterality: N/A;   BIV PACEMAKER INSERTION CRT-P N/A 12/14/2018   Medtronic model [id number] Percepta Quad CRT-P MRI Surescan (serial Number [id number]) device implanted by Dr Johney Frame for CHF and LBBB   CARDIOVERSION N/A 08/06/2013   Procedure: CARDIOVERSION;  Surgeon: Lewayne Bunting, MD;  Location: [facility] ENDOSCOPY;  Service: Cardiovascular;  Laterality: N/A;  spoke with [name]    CARDIOVERSION N/A 09/03/2013   Procedure: CARDIOVERSION;  Surgeon: Lesleigh Noe, MD;  Location: [facility] ENDOSCOPY;  Service: Cardiovascular;  Laterality: N/A;   CARDIOVERSION N/A 07/21/2021   Procedure: CARDIOVERSION;  Surgeon: Laurey Morale, MD;  Location: [facility] ENDOSCOPY;  Service: Cardiovascular;  Laterality: N/A;   CARDIOVERSION N/A 08/13/2022   Procedure: CARDIOVERSION;  Surgeon: Wendall Stade, MD;  Location: [facility] ENDOSCOPY;  Service: Cardiovascular;  Laterality: N/A;   CARDIOVERSION N/A 09/03/2022   Procedure: CARDIOVERSION;  Surgeon: Dolores Patty, MD;  Location: [facility] ENDOSCOPY;  Service: Cardiovascular;  Laterality: N/A;   CARDIOVERSION N/A 09/28/2022   Procedure: CARDIOVERSION;  Surgeon: Regan Lemming, MD;  Location: [facility]VAS[facility] CV LAB;  Service: Cardiovascular;  Laterality: N/A;   chronic systolic dysfu     NASAL SEPTUM SURGERY     RIGHT/LEFT HEART CATH AND CORONARY ANGIOGRAPHY N/A 12/04/2018   Procedure: RIGHT/LEFT HEART CATH AND CORONARY ANGIOGRAPHY;  Surgeon: Lyn Records, MD;  Location: [facility];  Service: Cardiovascular;  Laterality: N/A;   TEE WITHOUT CARDIOVERSION N/A 08/06/2013   Procedure: TRANSESOPHAGEAL ECHOCARDIOGRAM (TEE);  Surgeon: Lewayne Bunting, MD;  Location: [facility] ENDOSCOPY;  Service: Cardiovascular;  Laterality: N/A;   TEE WITHOUT CARDIOVERSION  N/A 12/30/2022   Procedure: TRANSESOPHAGEAL ECHOCARDIOGRAM (TEE);  Surgeon: Dolores Patty, MD;  Location: [facility] INVASIVE CV LAB;  Service: Cardiovascular;  Laterality: N/A;   VASECTOMY       Medications:  Outpatient Encounter Medications as of 08/31/2023  Medication Sig   acetaminophen (TYLENOL) 650 MG CR tablet Take 1,300 mg by mouth in the morning and at bedtime. Arthritis strength   ADVAIR DISKUS 250-50 MCG/DOSE AEPB Inhale 1 puff into the lungs 2 (two) times daily.   amiodarone (PACERONE) 100 MG tablet Take 1 tablet (100 mg total) by mouth daily.   apixaban (ELIQUIS) 5 MG TABS tablet Take 1 tablet (5 mg total) by mouth 2 (two) times daily. NEEDS FOLLOW UP APPOINTMENT FOR MORE REFILL S   carvedilol (COREG) 25 MG tablet Take 1 tablet (25 mg total) by mouth 2 (two) times daily with a meal.   cholecalciferol (VITAMIN D3) 25 MCG (1000 UT) tablet Take 1,000 Units by mouth 2 times daily at 12 noon and 4 pm.   CIALIS 20 MG tablet Take 20 mg by mouth daily as needed for erectile dysfunction.    cyclobenzaprine (FLEXERIL) 10 MG tablet Take 10 mg by mouth 3 (three) times daily.   empagliflozin (JARDIANCE) 10 MG TABS tablet Take 1 tablet (10 mg total) by mouth daily before breakfast.   fluticasone (FLONASE) 50 MCG/ACT nasal spray Place 1 spray into both nostrils 2 (two) times daily.  furosemide (LASIX) 80 MG tablet Take 1 tablet (80 mg total) by mouth 2 (two) times daily. (Patient taking differently: Take 80 mg by mouth 2 (two) times daily. 1 by mouth every morning and 1 by mouth every other day)   Multiple Vitamins-Minerals (PRESERVISION AREDS PO) Take 1 capsule by mouth 2 (two) times daily.   omeprazole (PRILOSEC OTC) 20 MG tablet Take 20 mg by mouth daily.   potassium chloride SA (KLOR-CON M) 20 MEQ tablet TAKE 2 TABLETS(40 MEQ) BY MOUTH DAILY   sacubitril-valsartan (ENTRESTO) 24-26 MG Take 1 tablet by mouth 2 (two) times daily.   spironolactone (ALDACTONE) 25 MG tablet TAKE 1 TABLET(25 MG) BY  MOUTH DAILY   tamsulosin (FLOMAX) 0.4 MG CAPS capsule Take 0.4 mg by mouth daily.   vitamin B-12 (CYANOCOBALAMIN) 1000 MCG tablet Take 1,000 mcg by mouth 2 (two) times daily at 10 AM and 5 PM.   zolpidem (AMBIEN CR) 12.5 MG CR tablet Take 12.5 mg by mouth at bedtime as needed for sleep.   [DISCONTINUED] metolazone (ZAROXOLYN) 2.5 MG tablet Take 1 tablet (2.5 mg total) by mouth as directed. By the HF Clinic (Patient not taking: Reported on 08/31/2023)   No facility-administered encounter medications on file as of 08/31/2023.    Allergies:  Allergies  Allergen Reactions   Ace Inhibitors Cough   Angiotensin Receptor Blockers Cough    Family History: Family History  Problem Relation Age of Onset   Atrial fibrillation Mother    COPD Father    Heart failure Father    Atrial fibrillation Sister    Cerebral aneurysm Brother    Healthy Daughter     Social History: Social History   Tobacco Use   Smoking status: Former    Current packs/day: 0.00    Average packs/day: 1.5 packs/day for 43.0 years (64.5 ttl pk-yrs)    Types: Cigarettes, Cigars    Start date: 08/02/1969    Quit date: 08/02/2012    Years since quitting: 11.0   Smokeless tobacco: Never   Tobacco comments:    Former smoker 01/27/23  Vaping Use   Vaping status: Never Used  Substance Use Topics   Alcohol use: Yes    Comment: once a week; previously 3-4 times per week - Occasional Beer   Drug use: No   Social History   Social History Narrative   Works as a Engineer, civil (consulting) for apartments   Lives with wife.  They have one grown daughter   Highest level of education:  college         Are you right handed or left handed? Right Handed   Are you currently employed ? 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 No    Who lives with you? Wife    What type of home do you live in: 1 story or 2 story? Lives in a two story home        Vital Signs:  BP 113/77   Pulse 83   Ht  5\' 11"  (1.803 m)   Wt 298 lb (135.2 kg)   SpO2 94%   BMI 41.56 kg/m   Neurological Exam: MENTAL STATUS including orientation to time, place, person, recent and remote memory, attention span and concentration, language, and fund of knowledge is normal.  Speech is not dysarthric.  CRANIAL NERVES: II:  No visual field defects.     III-IV-VI: Pupils equal round and reactive to light.  Normal  conjugate, extra-ocular eye movements in all directions of gaze.  No nystagmus.  No ptosis.   V:  Normal facial sensation.    VII:  Normal facial symmetry and movements.   VIII:  Normal hearing and vestibular function.   IX-X:  Normal palatal movement.   XI:  Normal shoulder shrug and head rotation.   XII:  Normal tongue strength and range of motion, no deviation or fasciculation.  MOTOR:  No atrophy, fasciculations or abnormal movements.  No pronator drift.   Upper Extremity:  Right  Left  Deltoid  5/5   5/5   Biceps  5/5   5/5   Triceps  5/5   5/5   Wrist extensors  5/5   5/5   Wrist flexors  5/5   5/5   Finger extensors  5/5   5/5   Finger flexors  5/5   5/5   Dorsal interossei  5/5   5/5   Abductor pollicis  5/5   5/5   Tone (Ashworth scale)  0  0   Lower Extremity:  Right  Left  Hip flexors  5/5   5/5   Knee flexors  5/5   5/5   Knee extensors  5/5   5/5   Dorsiflexors  5/5   5/5   Plantarflexors  5/5   5/5   Toe extensors  5/5   5/5   Toe flexors  5/5   5/5   Tone (Ashworth scale)  0  0   MSRs:                                           Right        Left brachioradialis 2+  2+  biceps 2+  2+  triceps 2+  2+  patellar 2+  2+  ankle jerk 1+  1+  Hoffman no  no  plantar response down  down   SENSORY:  Redcued vibration at the great toe, hyperesthesia to pin prick over the toes, temperature intact throughout. Romberg's sign absent.   COORDINATION/GAIT: Normal finger-to- nose-finger.  Intact rapid alternating movements bilaterally.    Gait narrow based and stable. Tandem and  stressed gait intact.    Thank you for allowing me to participate in patient's care.  If I can answer any additional questions, I would be pleased to do so.    Sincerely,    Omere Marti K. Allena Katz, DO

## 2023-09-05 ENCOUNTER — Ambulatory Visit: Payer: 59 | Attending: Cardiology

## 2023-09-05 DIAGNOSIS — Z950 Presence of cardiac pacemaker: Secondary | ICD-10-CM

## 2023-09-05 DIAGNOSIS — I5042 Chronic combined systolic (congestive) and diastolic (congestive) heart failure: Secondary | ICD-10-CM

## 2023-09-05 NOTE — Telephone Encounter (Signed)
Order placed to [employer] via community message

## 2023-09-05 NOTE — Telephone Encounter (Signed)
Please order ResMed auto CPAP with following settings: AutoSet Min Pressure 7 cmH2O Max Pressure 15 cmH2O EPR Fulltime EPR level 2

## 2023-09-06 NOTE — Progress Notes (Signed)
EPIC Encounter for ICM Monitoring  Patient Name: Nicholas Foley is a 72 y.o. male Date: 09/06/2023 Primary Care Physican: Camie Patience, FNP rimary Cardiologist: Smith/Bensimhon Electrophysiologist: Lalla Brothers Nephrologist: [facility]: Dr Valentino Nose Bi-V Pacing:  98.3% 01/03/2023 Weight: 299 lbs 01/05/2023 Weight: 297 lbs 02/09/2023 Weight: 296 lbs 03/15/2023 Weight: 292 lbs 08/02/2023 Weight: 291 lbs    Since 15-Aug-2023 Time in AT/AF <0.1 hr/day (<0.1%)         Spoke with patient and heart failure questions reviewed.  Transmission results reviewed.  Pt reports feeling very fatigued, tired and SOB for the last few days which he thinks can be related to fluid.            Optivol thoracic impedance suggesting intermittent days with possible fluid accumulation within the last month.      Prescribed: Furosemide 40 mg take 2 tablets (80 mg total) twice a day.  Pt reports 8/5 taking differently:  1 Tablet every morning and 1 tablet every other evening (changed by physician) Potassium 20 mEq take 1 tablet twice a day  Metolazone 2.5 mg take 1 tablet for 1 dose with 40 mEq of Potassium   Labs: 07/22/2023 Creatinine 1.86, BUN 34, Potassium 4.4, Sodium 129, GFR 38 06/08/2023 Creatinine 2.03, BUN 33, Potassium 4.9, Sodium 136, GFR 35 01/03/2023 Creatinine 1.75, BUN 23, Potassium 3.8, Sodium 137, GFR 42  12/08/2022 Creatinine 1.84, BUN 32, Potassium 4.2, Sodium 137  A complete set of results can be found in Results Review.   Recommendations:  Pt will add extra 40 mg Lasix for 2 days.      Follow-up plan: ICM clinic phone appointment on 09/13/2023 to recheck fluid levels.   91 day device clinic remote transmission 10/19/2023.     EP/Cardiology Office Visits:   10/26/2023 with Dr Lalla Brothers.   09/20/2023 with Dr Gala Romney.     Copy of ICM check sent to Dr Lalla Brothers.   3 month ICM trend: 09/04/2023.    12-14 Month ICM trend:     Karie Soda, RN 09/06/2023 9:38 AM

## 2023-09-07 ENCOUNTER — Telehealth: Payer: Self-pay | Admitting: Cardiology

## 2023-09-07 LAB — PROTEIN ELECTROPHORESIS, SERUM
Albumin ELP: 4.1 g/dL (ref 3.8–4.8)
Alpha 1: 0.3 g/dL (ref 0.2–0.3)
Alpha 2: 0.8 g/dL (ref 0.5–0.9)
Beta 2: 0.6 g/dL — ABNORMAL HIGH (ref 0.2–0.5)
Beta Globulin: 0.5 g/dL (ref 0.4–0.6)
Gamma Globulin: 1 g/dL (ref 0.8–1.7)
Total Protein: 7.3 g/dL (ref 6.1–8.1)

## 2023-09-07 LAB — VITAMIN B1: Vitamin B1 (Thiamine): 10 nmol/L (ref 8–30)

## 2023-09-07 LAB — IMMUNOFIXATION ELECTROPHORESIS
IgM, Serum: 1055 mg/dL (ref 600–300)
IgM, Serum: 164 mg/dL (ref 50–300)
Immunoglobulin A: 1055 mg/dL — ABNORMAL HIGH (ref 70–320)
Immunoglobulin A: 453 mg/dL — ABNORMAL HIGH (ref 70–320)

## 2023-09-07 LAB — COPPER, SERUM: Copper: 89 ug/dL (ref 70–175)

## 2023-09-07 NOTE — Telephone Encounter (Signed)
Pt states he needs to speak with Dr. Geannie Risen nurse. Please advise

## 2023-09-07 NOTE — Telephone Encounter (Signed)
Patient calling stating his CPAP machine is broken. He now has a Development worker, community and will have to return it by the end of the month. He will need a new one before Nov 1 because he will no longer have insurance. He needs a new order for his CPAP.    Previous  encounter shows order was placed on October 14

## 2023-09-09 ENCOUNTER — Ambulatory Visit (INDEPENDENT_AMBULATORY_CARE_PROVIDER_SITE_OTHER): Payer: 59 | Admitting: Neurology

## 2023-09-09 ENCOUNTER — Ambulatory Visit: Payer: 59 | Admitting: Neurology

## 2023-09-09 DIAGNOSIS — R202 Paresthesia of skin: Secondary | ICD-10-CM

## 2023-09-09 DIAGNOSIS — G629 Polyneuropathy, unspecified: Secondary | ICD-10-CM | POA: Diagnosis not present

## 2023-09-09 NOTE — Procedures (Signed)
[facility] Neurology  [address], [address]  [place], [place] [address] Tel: [phone] Fax: [phone] Test Date:  09/09/2023  Patient: Nicholas Foley DOB: Jan 15, 1951 Physician: Nita Sickle, DO  Sex: Male Height: 5\' 11"  Ref Phys: Nita Sickle, DO  ID#: [id number]   Technician:    History: This is a 72 year old man referred for evaluation of bilateral feet paresthesias and pain.  NCV & EMG Findings: Electrodiagnostic testing of the right lower extremity and additional studies of the left shows: Bilateral sural and superficial peroneal sensory responses are within normal limits. Bilateral peroneal and tibial motor responses are within normal limits. Bilateral tibial H reflex studies are within normal limits. There is no evidence of active or chronic motor axonal changes affecting any of the tested muscles.  Motor unit configuration and recruitment pattern is within normal limits.  Of note, deep and proximal muscles were not tested as the patient is on anticoagulation therapy.   Impression: This is a normal study of the lower extremities.  In particular, there is no evidence of a large fiber sensorimotor polyneuropathy or lumbosacral radiculopathy.    ___________________________ Nita Sickle, DO    Nerve Conduction Studies   Stim Site NR Peak (ms) Norm Peak (ms) O-P Amp (V) Norm O-P Amp  Left Sup Peroneal Anti Sensory (Ant Lat Mall)  32 C  12 cm    3.5 <4.6 7.5 >3  Right Sup Peroneal Anti Sensory (Ant Lat Mall)  32 C  12 cm    3.0 <4.6 6.1 >3  Left Sural Anti Sensory (Lat Mall)  32 C  Calf    3.3 <4.6 7.2 >3  Right Sural Anti Sensory (Lat Mall)  32 C  Calf    2.4 <4.6 8.6 >3     Stim Site NR Onset (ms) Norm Onset (ms) O-P Amp (mV) Norm O-P Amp Site1 Site2 Delta-0 (ms) Dist (cm) Vel (m/s) Norm Vel (m/s)  Left Peroneal Motor (Ext Dig Brev)  32 C  Ankle    4.0 <6.0 4.1 >2.5 B Fib Ankle 8.2 35.0 43 >40  B Fib    12.2  3.5  Poplt B Fib 1.7 10.0 59 >40  Poplt     13.9  3.4         Right Peroneal Motor (Ext Dig Brev)  32 C  Ankle    3.9 <6.0 5.4 >2.5 B Fib Ankle 8.1 41.0 51 >40  B Fib    12.0  4.7  Poplt B Fib 1.9 10.0 53 >40  Poplt    13.9  4.5         Left Tibial Motor (Abd Hall Brev)  32 C  Ankle    4.5 <6.0 10.1 >4 Knee Ankle 9.6 44.0 46 >40  Knee    14.1  6.8         Right Tibial Motor (Abd Hall Brev)  32 C  Ankle    3.6 <6.0 11.4 >4 Knee Ankle 8.7 44.0 51 >40  Knee    12.3  8.1          Electromyography   Side Muscle Ins.Act Fibs Fasc Recrt Amp Dur Poly Activation Comment  Right AntTibialis Nml Nml Nml Nml Nml Nml Nml Nml N/A  Right Gastroc Nml Nml Nml Nml Nml Nml Nml Nml N/A  Right RectFemoris Nml Nml Nml Nml Nml Nml Nml Nml N/A  Right BicepsFemS Nml Nml Nml Nml Nml Nml Nml Nml N/A  Left AntTibialis Nml Nml Nml Nml Nml Nml  Nml Nml N/A  Left Gastroc Nml Nml Nml Nml Nml Nml Nml Nml N/A  Left RectFemoris Nml Nml Nml Nml Nml Nml Nml Nml N/A  Left BicepsFemS Nml Nml Nml Nml Nml Nml Nml Nml N/A      Waveforms:

## 2023-09-13 ENCOUNTER — Telehealth: Payer: Self-pay | Admitting: Cardiology

## 2023-09-13 ENCOUNTER — Ambulatory Visit: Payer: 59 | Attending: Cardiology

## 2023-09-13 DIAGNOSIS — I5042 Chronic combined systolic (congestive) and diastolic (congestive) heart failure: Secondary | ICD-10-CM

## 2023-09-13 DIAGNOSIS — Z950 Presence of cardiac pacemaker: Secondary | ICD-10-CM

## 2023-09-13 NOTE — Telephone Encounter (Signed)
See 10/16 encounter.  Patient states the CPAP company needs an authorization from Dr. Mayford Knife and he's been waiting to hear back from someone for a few weeks regarding this matter. He states the authorization is needed before 11/01 or he'll have to pay 100% because his insurance will start over.

## 2023-09-13 NOTE — Telephone Encounter (Signed)
Reached out [employer] spoke to [facility] and we found out the patient was set up 11-21-2018 is not eligible for a new one until 5 years 11-23-2023. Patient is aware and is agreeable to treatment.

## 2023-09-14 NOTE — Progress Notes (Signed)
EPIC Encounter for ICM Monitoring  Patient Name: Nicholas Foley is a 72 y.o. male Date: 09/14/2023 Primary Care Physican: Camie Patience, FNP Primary Cardiologist: Smith/Bensimhon Electrophysiologist: Lalla Brothers Nephrologist: [facility]: Dr Valentino Nose Bi-V Pacing:  98.3% 01/03/2023 Weight: 299 lbs 01/05/2023 Weight: 297 lbs 02/09/2023 Weight: 296 lbs 03/15/2023 Weight: 292 lbs 08/02/2023 Weight: 291 lbs    Since 15-Aug-2023 Time in AT/AF <0.1 hr/day (<0.1%)         Spoke with patient and heart failure questions reviewed.  Transmission results reviewed.  Pt asymptomatic for fluid accumulation.  Reports feeling well at this time and voices no complaints.           Optivol thoracic impedance suggesting fluid levels returning to normal.      Prescribed: Furosemide 40 mg take 2 tablets (80 mg total) twice a day.  Pt reports 8/5 taking differently:  1 Tablet every morning and 1 tablet every other evening (changed by physician) Potassium 20 mEq take 1 tablet twice a day  Metolazone 2.5 mg take 1 tablet for 1 dose with 40 mEq of Potassium   Labs: 07/22/2023 Creatinine 1.86, BUN 34, Potassium 4.4, Sodium 129, GFR 38 06/08/2023 Creatinine 2.03, BUN 33, Potassium 4.9, Sodium 136, GFR 35 01/03/2023 Creatinine 1.75, BUN 23, Potassium 3.8, Sodium 137, GFR 42  12/08/2022 Creatinine 1.84, BUN 32, Potassium 4.2, Sodium 137  A complete set of results can be found in Results Review.   Recommendations:  No changes and encouraged to call if experiencing any fluid symptoms.     Follow-up plan: ICM clinic phone appointment on 10/10/2023.   91 day device clinic remote transmission 10/19/2023.     EP/Cardiology Office Visits:   10/26/2023 with Dr Lalla Brothers.   09/20/2023 with Dr Gala Romney.     Copy of ICM check sent to Dr Lalla Brothers.    3 month ICM trend: 09/12/2023.    12-14 Month ICM trend:     Karie Soda, RN 09/14/2023 3:11 PM

## 2023-09-19 ENCOUNTER — Encounter (INDEPENDENT_AMBULATORY_CARE_PROVIDER_SITE_OTHER): Payer: 59 | Admitting: Ophthalmology

## 2023-09-19 DIAGNOSIS — I10 Essential (primary) hypertension: Secondary | ICD-10-CM

## 2023-09-19 DIAGNOSIS — H43813 Vitreous degeneration, bilateral: Secondary | ICD-10-CM | POA: Diagnosis not present

## 2023-09-19 DIAGNOSIS — H35033 Hypertensive retinopathy, bilateral: Secondary | ICD-10-CM | POA: Diagnosis not present

## 2023-09-19 DIAGNOSIS — H33302 Unspecified retinal break, left eye: Secondary | ICD-10-CM | POA: Diagnosis not present

## 2023-09-19 DIAGNOSIS — H353122 Nonexudative age-related macular degeneration, left eye, intermediate dry stage: Secondary | ICD-10-CM

## 2023-09-20 ENCOUNTER — Ambulatory Visit (HOSPITAL_COMMUNITY)
Admission: RE | Admit: 2023-09-20 | Discharge: 2023-09-20 | Disposition: A | Discharge status: Home / Self Care | Payer: 59 | Source: Ambulatory Visit | Attending: Internal Medicine | Admitting: Internal Medicine

## 2023-09-20 ENCOUNTER — Ambulatory Visit (HOSPITAL_BASED_OUTPATIENT_CLINIC_OR_DEPARTMENT_OTHER)
Admission: RE | Admit: 2023-09-20 | Discharge: 2023-09-20 | Disposition: A | Discharge status: Home / Self Care | Payer: 59 | Source: Ambulatory Visit | Attending: Internal Medicine | Admitting: Internal Medicine

## 2023-09-20 VITALS — BP 142/80 | HR 68 | Wt 302.4 lb

## 2023-09-20 DIAGNOSIS — I77810 Thoracic aortic ectasia: Secondary | ICD-10-CM | POA: Diagnosis not present

## 2023-09-20 DIAGNOSIS — N1832 Chronic kidney disease, stage 3b: Secondary | ICD-10-CM | POA: Diagnosis not present

## 2023-09-20 DIAGNOSIS — Z6841 Body Mass Index (BMI) 40.0 and over, adult: Secondary | ICD-10-CM | POA: Insufficient documentation

## 2023-09-20 DIAGNOSIS — Z7901 Long term (current) use of anticoagulants: Secondary | ICD-10-CM | POA: Insufficient documentation

## 2023-09-20 DIAGNOSIS — I480 Paroxysmal atrial fibrillation: Secondary | ICD-10-CM | POA: Diagnosis not present

## 2023-09-20 DIAGNOSIS — J449 Chronic obstructive pulmonary disease, unspecified: Secondary | ICD-10-CM | POA: Insufficient documentation

## 2023-09-20 DIAGNOSIS — I4819 Other persistent atrial fibrillation: Secondary | ICD-10-CM

## 2023-09-20 DIAGNOSIS — Z79899 Other long term (current) drug therapy: Secondary | ICD-10-CM | POA: Diagnosis not present

## 2023-09-20 DIAGNOSIS — I130 Hypertensive heart and chronic kidney disease with heart failure and stage 1 through stage 4 chronic kidney disease, or unspecified chronic kidney disease: Secondary | ICD-10-CM | POA: Diagnosis not present

## 2023-09-20 DIAGNOSIS — Z45018 Encounter for adjustment and management of other part of cardiac pacemaker: Secondary | ICD-10-CM | POA: Diagnosis not present

## 2023-09-20 DIAGNOSIS — I428 Other cardiomyopathies: Secondary | ICD-10-CM | POA: Insufficient documentation

## 2023-09-20 DIAGNOSIS — Z87891 Personal history of nicotine dependence: Secondary | ICD-10-CM | POA: Diagnosis not present

## 2023-09-20 DIAGNOSIS — G4733 Obstructive sleep apnea (adult) (pediatric): Secondary | ICD-10-CM | POA: Diagnosis not present

## 2023-09-20 DIAGNOSIS — I447 Left bundle-branch block, unspecified: Secondary | ICD-10-CM | POA: Insufficient documentation

## 2023-09-20 DIAGNOSIS — I5042 Chronic combined systolic (congestive) and diastolic (congestive) heart failure: Secondary | ICD-10-CM

## 2023-09-20 DIAGNOSIS — Z950 Presence of cardiac pacemaker: Secondary | ICD-10-CM

## 2023-09-20 LAB — ECHOCARDIOGRAM COMPLETE
Area-P 1/2: 3.74 cm2
Calc EF: 41.6 %
MV VTI: 4.7 cm2
S' Lateral: 4.05 cm
Single Plane A2C EF: 41.3 %
Single Plane A4C EF: 43.4 %

## 2023-09-20 MED ORDER — FUROSEMIDE 80 MG PO TABS: 80.0000 mg | ORAL_TABLET | Freq: Two times a day (BID) | ORAL | 3 refills | Status: DC

## 2023-09-20 NOTE — Patient Instructions (Signed)
Change Lasix to 80 mg Twice daily  Blood work in 2 weeks  Your physician recommends that you schedule a follow-up appointment in: 1 month.  If you have any questions or concerns before your next appointment please send [place] a message through [employer] or call our office at [phone].    TO LEAVE A MESSAGE FOR THE NURSE SELECT OPTION 2, PLEASE LEAVE A MESSAGE INCLUDING: YOUR NAME DATE OF BIRTH CALL BACK NUMBER REASON FOR CALL**this is important as we prioritize the call backs  YOU WILL RECEIVE A CALL BACK THE SAME DAY AS LONG AS YOU CALL BEFORE 4:00 PM  At the [facility], you and your health needs are our priority. As part of our continuing mission to provide you with exceptional heart care, we have created designated Provider Care Teams. These Care Teams include your primary Cardiologist (physician) and Advanced Practice Providers (APPs- Physician Assistants and Nurse Practitioners) who all work together to provide you with the care you need, when you need it.   You may see any of the following providers on your designated Care Team at your next follow up: Dr Arvilla Meres Dr Marca Ancona Dr. Dorthula Nettles Dr. Clearnce Hasten Amy Filbert Schilder, NP Robbie Lis, [facility] [place], [place] Brynda Peon, NP [place] Lee, NP Karle Plumber, PharmD   Please be sure to bring in all your medications bottles to every appointment.    Thank you for choosing  [facility]

## 2023-09-20 NOTE — Progress Notes (Signed)
  Echocardiogram 2D Echocardiogram has been performed.  Nicholas Foley 09/20/2023, 11:51 AM

## 2023-09-20 NOTE — Progress Notes (Signed)
Advanced [facility] Failure Clinic Note   Date:  09/20/2023   ID:  Nicholas Foley, DOB [birth year], MRN [id number]  Location: Home  Provider location: [place] [facility] Type of Visit: Established patient  PCP:  Camie Patience, FNP  Primary Cardiologist:  Lesleigh Noe, MD (Inactive) Nephrology: Dr. Valentino Nose HF Cardiologist: Thinh Cuccaro  Chief Complaint: Heart Failure follow-up    HPI: Nicholas Foley is a 72 y.o. with morbid obesity, chronic systolic heart failure, COPD, OSA, PAF, previously on amio-> stopped 2018,  HTN, LBBB, Medtronic CRT-P, and former smoker.   He has h/o NICM which seems to date back to 2014. Had cardiac cath in 1/20 with no CAD and well compensated filling pressures.    Admitted in 1/20 for CRT-P device in setting of LBBB. Intially he felt a lot better but has gradually declined.   Echo 01/30/20 EF 40-45% (read as 35-40%)  Echo EF 2/22 45% (in some images 45-50%) Limited by septal paradox.   Got COVID again in 1/22 but was a mild case.   Had recurrent AF. Underwent DC-CV 8/22.   Echo 04/12/22 showed EF 50-55%, LV dyysynchrony, grade I DD,  RV ok.   Developed recurrent AF in early 9/23. Seen in AF clinic. Amio started. Had DC-CV attempt on 08/13/22 which failed x 3. Successful DCCV 11/23 to NSR. Acute visit 12/23, NYHA II-III and volume up. Instructed to take metolazone 2.5/40 KCL x 1.  TEE 12/30/22 showing EF 55%, moderate LAE, RV mildy HK, moderate pericardial effusion and no LAA clot. Then had successful PVI. Started on colchicine and PPI.  Had AF ablation in 2/24  Today he returns for HF follow up. Says he feels ok. Goes to gym 2x/week and works hard with Psychologist, educational. No change with that. But says every 2-3 weeks says he gets SOB and fatigued for several days. Denies CP. Occasional swelling. Says he felt better when he was on high dose Entresto. Taking lasix 80 daily alternating with 80 bid (previously was 80 bid)   Wears CPAP on a daily  basis and even takes it with him when he is travelling   Cardiac Studies - TEE (2/24): EF 55%, moderate LAE, RV mildy HK, moderate pericardial effusion and no LAA clot.  - Echo (5/23): EF 50-55%, severe LV dyysynchrony, grade I DD< RV ok,   - Echo (2/22): EF45%  - Echo (3/21): EF 40-45% (read as 35-40%).  - CPX (09/18/19):  Limited by obesity and mild heart failure Peak VO2 15.1  (80% predicted peak VO2) - when corrected to ibw pVO2 27.5 Slope 33 RER 1.02    - L/RHC (1/20) No coronary disease.  RA 13 PA 37/20 (27)  PCWP 14 CO 8.5 CI 3.3    - Echo (11/20): RV normal EF 45-50%   - Echo (11/19): EF 35-40% Grade II DD     Past Medical History:  Diagnosis Date   Atrial fibrillation with rapid ventricular response (HCC) 08/02/2013   CHF (congestive heart failure) (HCC)    Chronic systolic dysfunction of left ventricle 08/02/2013   BNP greater than 2000    COPD (chronic obstructive pulmonary disease) (HCC)    Moderate by PFTs with response to bronchodilators, May 2014   Erectile dysfunction    Hypertension    Insomnia 08/02/13   Left bundle branch block 08/02/2013   Morbid obesity (HCC) 08/02/2013   OSA (obstructive sleep apnea)    severe OSA with AHI 74/hr now on  CPAP at 8cm H2O   Past Surgical History:  Procedure Laterality Date   ATRIAL FIBRILLATION ABLATION N/A 12/30/2022   Procedure: ATRIAL FIBRILLATION ABLATION;  Surgeon: Lanier Prude, MD;  Location: [facility];  Service: Cardiovascular;  Laterality: N/A;   BIV PACEMAKER INSERTION CRT-P N/A 12/14/2018   Medtronic model [id number] Percepta Quad CRT-P MRI Surescan (serial Number [id number]) device implanted by Dr Johney Frame for CHF and LBBB   CARDIOVERSION N/A 08/06/2013   Procedure: CARDIOVERSION;  Surgeon: Lewayne Bunting, MD;  Location: [facility] ENDOSCOPY;  Service: Cardiovascular;  Laterality: N/A;  spoke with [name]    CARDIOVERSION N/A 09/03/2013   Procedure: CARDIOVERSION;  Surgeon: Lesleigh Noe, MD;   Location: [facility] ENDOSCOPY;  Service: Cardiovascular;  Laterality: N/A;   CARDIOVERSION N/A 07/21/2021   Procedure: CARDIOVERSION;  Surgeon: Laurey Morale, MD;  Location: [facility] ENDOSCOPY;  Service: Cardiovascular;  Laterality: N/A;   CARDIOVERSION N/A 08/13/2022   Procedure: CARDIOVERSION;  Surgeon: Wendall Stade, MD;  Location: [facility] ENDOSCOPY;  Service: Cardiovascular;  Laterality: N/A;   CARDIOVERSION N/A 09/03/2022   Procedure: CARDIOVERSION;  Surgeon: Dolores Patty, MD;  Location: [facility] ENDOSCOPY;  Service: Cardiovascular;  Laterality: N/A;   CARDIOVERSION N/A 09/28/2022   Procedure: CARDIOVERSION;  Surgeon: Regan Lemming, MD;  Location: [facility] INVAS[facility] CV LAB;  Service: Cardiovascular;  Laterality: N/A;   chronic systolic dysfu     NASAL SEPTUM SURGERY     RIGHT/LEFT HEART CATH AND CORONARY ANGIOGRAPHY N/A 12/04/2018   Procedure: RIGHT/LEFT HEART CATH AND CORONARY ANGIOGRAPHY;  Surgeon: Lyn Records, MD;  Location: [facility];  Service: Cardiovascular;  Laterality: N/A;   TEE WITHOUT CARDIOVERSION N/A 08/06/2013   Procedure: TRANSESOPHAGEAL ECHOCARDIOGRAM (TEE);  Surgeon: Lewayne Bunting, MD;  Location: [facility] ENDOSCOPY;  Service: Cardiovascular;  Laterality: N/A;   TEE WITHOUT CARDIOVERSION N/A 12/30/2022   Procedure: TRANSESOPHAGEAL ECHOCARDIOGRAM (TEE);  Surgeon: Dolores Patty, MD;  Location: [facility] INVASIVE CV LAB;  Service: Cardiovascular;  Laterality: N/A;   VASECTOMY       Current Outpatient Medications  Medication Sig Dispense Refill   acetaminophen (TYLENOL) 650 MG CR tablet Take 1,300 mg by mouth in the morning and at bedtime. Arthritis strength     ADVAIR DISKUS 250-50 MCG/DOSE AEPB Inhale 1 puff into the lungs 2 (two) times daily.     amiodarone (PACERONE) 100 MG tablet Take 1 tablet (100 mg total) by mouth daily. 90 tablet 3   apixaban (ELIQUIS) 5 MG TABS tablet Take 1 tablet (5 mg total) by mouth 2 (two) times daily. NEEDS FOLLOW UP APPOINTMENT FOR MORE REFILL S 180  tablet 0   carvedilol (COREG) 25 MG tablet Take 1 tablet (25 mg total) by mouth 2 (two) times daily with a meal. 180 tablet 3   cholecalciferol (VITAMIN D3) 25 MCG (1000 UT) tablet Take 1,000 Units by mouth 2 times daily at 12 noon and 4 pm.     CIALIS 20 MG tablet Take 20 mg by mouth daily as needed for erectile dysfunction.   0   cyclobenzaprine (FLEXERIL) 10 MG tablet Take 10 mg by mouth 3 (three) times daily.     empagliflozin (JARDIANCE) 10 MG TABS tablet Take 1 tablet (10 mg total) by mouth daily before breakfast. 90 tablet 3   fluticasone (FLONASE) 50 MCG/ACT nasal spray Place 1 spray into both nostrils 2 (two) times daily.   0   gabapentin (NEURONTIN) 100 MG capsule Take 1 tablet at bedtime x 2  weeks then increase to 1 tablet twice daily. 60 capsule 3   Multiple Vitamins-Minerals (PRESERVISION AREDS PO) Take 1 capsule by mouth 2 (two) times daily.     omeprazole (PRILOSEC OTC) 20 MG tablet Take 20 mg by mouth daily.     potassium chloride SA (KLOR-CON M) 20 MEQ tablet TAKE 2 TABLETS(40 MEQ) BY MOUTH DAILY 180 tablet 1   sacubitril-valsartan (ENTRESTO) 24-26 MG Take 1 tablet by mouth 2 (two) times daily. 60 tablet 11   spironolactone (ALDACTONE) 25 MG tablet TAKE 1 TABLET(25 MG) BY MOUTH DAILY 90 tablet 1   tamsulosin (FLOMAX) 0.4 MG CAPS capsule Take 0.4 mg by mouth daily.     vitamin B-12 (CYANOCOBALAMIN) 1000 MCG tablet Take 1,000 mcg by mouth 2 (two) times daily at 10 AM and 5 PM.     zolpidem (AMBIEN CR) 12.5 MG CR tablet Take 12.5 mg by mouth at bedtime as needed for sleep.     furosemide (LASIX) 80 MG tablet Take 1 tablet (80 mg total) by mouth 2 (two) times daily. (Patient taking differently: Take 80 mg by mouth 2 (two) times daily. 1 by mouth every morning and 1 by mouth every other day) 360 tablet 3   No current facility-administered medications for this encounter.    Allergies:   Ace inhibitors and Angiotensin receptor blockers   Social History:  The patient  reports that he  quit smoking about 11 years ago. His smoking use included cigarettes and cigars. He started smoking about 54 years ago. He has a 64.5 pack-year smoking history. He has never used smokeless tobacco. He reports current alcohol use. He reports that he does not use drugs.   Family History:  The patient's family history includes Atrial fibrillation in his mother and sister; COPD in his father; Cerebral aneurysm in his brother; Healthy in his daughter; Heart failure in his father.   ROS:  Please see the history of present illness.   All other systems are personally reviewed and negative.   Recent Labs: 11/18/2022: B Natriuretic Peptide 41.7 07/22/2023: ALT 18; BUN 34; Creatinine, Ser 1.86; Hemoglobin 13.2; Magnesium 2.3; Platelets 247; Potassium 4.4; Sodium 129; TSH 2.570  Personally reviewed   Wt Readings from Last 3 Encounters:  09/20/23 (!) 137.2 kg (302 lb 6.4 oz)  08/31/23 135.2 kg (298 lb)  07/22/23 134.4 kg (296 lb 4 oz)    BP (!) 142/80   Pulse 68   Wt (!) 137.2 kg (302 lb 6.4 oz)   SpO2 98%   BMI 42.18 kg/m   Physical Exam General:  Well appearing. No resp difficulty HEENT: normal Neck: supple. no JVD. Carotids 2+ bilat; no bruits. No lymphadenopathy or thryomegaly appreciated. Cor: PMI nondisplaced. Regular rate & rhythm. No rubs, gallops or murmurs. Lungs: clear Abdomen: obese  soft, nontender, nondistended. No hepatosplenomegaly. No bruits or masses. Good bowel sounds. Extremities: no cyanosis, clubbing, rash, edema Neuro: alert & orientedx3, cranial nerves grossly intact. moves all 4 extremities w/o difficulty. Affect pleasant   Device interrogation (personally reviewed): OptiVol up and down cyclically. BIV pacing 97.8  2.9 hr day/activity, No AF  ASSESSMENT AND PLAN:  1. Chronic Combined Systolic/Diastolic HF - NICM (thought due to LBBB). 12/2018 LHC no coronary disease.  - s/p Medtronic CRT-P - Echo (1/19) EF 35-40%. ECHO 11/19 EF 45-50%.  - Echo (11/20): EF read as  35-40% but with Definity I felt closed to 45% - Echo (3/21): EF read as 35-40% I think 40-45% - Echo EF (2/22):  EF 45% (in some images 45-50%) Limited by septal paradox. - CPX test (10/20) suboptimal effort mostly limited to obesity  - Echo (5/23) EF 50-55%, LV dyysynchrony, grade I DD, RV ok.  - TEE (2/24): EF 55%, moderate LAE, RV mildy HK - Stable NYHA II-early III. Suspect waxing/waning symptoms related to fluctuations in volume status - Increase lasix to 80 bid - Continue carvedilol 25 mg bid. - Continue Entresto 24-26 mg bid. - Continue spiro 25 mg daily. - Continue Jadiance 10 mg daily. - Will check labs in 2 weeks - Will have him see NP/PA in 1 month to reassess and hopefully increase Entresto to 49/51 bid - ICD interrogate personally as above   2. PAF  - In the past he was on amiodarone. Stopped ~2017 - Underwent AF ablation 2/24  - remains in NSR today - Continue Eliquis 5 bid. No bleeding issues. - No AF on device interrogation today  3. OSA - Wears CPAP on a daily basis and even takes it with him when he is travelling    4. Obesity -  Body mass index is 42.18 kg/m.  -  Continue weight loss efforts.  -  Insurance denied [employer].  5. COPD  - No longer smoking.  6. CKD 3b - Baseline SCr 1.5-1.6 (most recent 1.8 on 07/22/23) - Followed by Dr. Valentino Nose - Continue SGLT2i. - Labs in 2 weeks as above   Arvilla Meres, MD  09/20/2023 12:20 PM  [facility] [address] [facility] [place] [address] [phone] (office) [phone] (fax)

## 2023-09-27 ENCOUNTER — Other Ambulatory Visit (HOSPITAL_COMMUNITY): Payer: 59

## 2023-09-30 ENCOUNTER — Encounter: Payer: Self-pay | Admitting: Internal Medicine

## 2023-10-04 ENCOUNTER — Ambulatory Visit (HOSPITAL_COMMUNITY)
Admission: RE | Admit: 2023-10-04 | Discharge: 2023-10-04 | Disposition: A | Discharge status: Home / Self Care | Payer: Medicare Other | Source: Ambulatory Visit | Attending: Cardiology | Admitting: Cardiology

## 2023-10-04 DIAGNOSIS — I5042 Chronic combined systolic (congestive) and diastolic (congestive) heart failure: Secondary | ICD-10-CM | POA: Diagnosis present

## 2023-10-04 LAB — BASIC METABOLIC PANEL
Anion gap: 9 (ref 5–15)
BUN: 36 mg/dL — ABNORMAL HIGH (ref 8–23)
CO2: 25 mmol/L (ref 22–32)
Calcium: 8.8 mg/dL — ABNORMAL LOW (ref 8.9–10.3)
Chloride: 104 mmol/L (ref 98–111)
Creatinine, Ser: 2.19 mg/dL — ABNORMAL HIGH (ref 0.61–1.24)
GFR, Estimated: 32 mL/min — ABNORMAL LOW (ref >60)
Glucose, Bld: 100 mg/dL — ABNORMAL HIGH (ref 70–99)
Potassium: 4.4 mmol/L (ref 3.5–5.1)
Sodium: 138 mmol/L (ref 135–145)

## 2023-10-06 ENCOUNTER — Encounter: Payer: 59 | Admitting: Neurology

## 2023-10-10 ENCOUNTER — Ambulatory Visit: Payer: Medicare Other | Attending: Cardiology

## 2023-10-10 DIAGNOSIS — I5042 Chronic combined systolic (congestive) and diastolic (congestive) heart failure: Secondary | ICD-10-CM | POA: Diagnosis not present

## 2023-10-10 DIAGNOSIS — Z950 Presence of cardiac pacemaker: Secondary | ICD-10-CM | POA: Diagnosis not present

## 2023-10-10 NOTE — Progress Notes (Signed)
EPIC Encounter for ICM Monitoring  Patient Name: Nicholas Foley is a 72 y.o. male Date: 10/10/2023 Primary Care Physican: Camie Patience, FNP Primary Cardiologist: Smith/Bensimhon Electrophysiologist: Lalla Brothers Nephrologist: [facility]: Dr Valentino Nose Bi-V Pacing:  98.2% 01/03/2023 Weight: 299 lbs 01/05/2023 Weight: 297 lbs 02/09/2023 Weight: 296 lbs 03/15/2023 Weight: 292 lbs 08/02/2023 Weight: 291 lbs    Since 20-Sep-2023 Time in AT/AF <0.1 hr/day (<0.1%)         Spoke with patient and heart failure questions reviewed.  Transmission results reviewed.  Pt asymptomatic for fluid accumulation.  Reports feeling well at this time and voices no complaints.            Optivol thoracic impedance suggesting possible fluid accumulation starting 11/14 and starting to trend back to baseline.      Prescribed: Furosemide 80 mg take 1 tablet(s) (80 mg total) twice a day.   Potassium 20 mEq take 2 tablet(s) daily Spironolactone 25 mg take 1 tablet daily   Labs: 10/04/2023 Creatinine 2.19, BUN 36, Potassium 4.4, Sodium 138, GFR 32 07/22/2023 Creatinine 1.86, BUN 34, Potassium 4.4, Sodium 129, GFR 38 06/08/2023 Creatinine 2.03, BUN 33, Potassium 4.9, Sodium 136, GFR 35 01/03/2023 Creatinine 1.75, BUN 23, Potassium 3.8, Sodium 137, GFR 42  12/08/2022 Creatinine 1.84, BUN 32, Potassium 4.2, Sodium 137  A complete set of results can be found in Results Review.   Recommendations:  He will limit fluid intake and call if he develops symptoms.     Follow-up plan: ICM clinic phone appointment on 11/14/2023 (fluid levels will be rechecked 11/26 & 12/4).   91 day device clinic remote transmission 10/19/2023.     EP/Cardiology Office Visits:   10/26/2023 with Dr Lalla Brothers.   10/18/2023 with HF clinic.     Copy of ICM check sent to Dr Lalla Brothers.    3 month ICM trend: 10/10/2023.    12-14 Month ICM trend:     Karie Soda, RN 10/10/2023 10:40 AM

## 2023-10-14 NOTE — Progress Notes (Signed)
Advanced Heart Failure Clinic Note   Date:  10/18/2023   ID:  Nicholas Foley, DOB Jan 17, 1951, MRN [id number]  Location: Home  Provider location: [facility] Type of Visit: Established patient  PCP:  Nicholas Patience, FNP  Primary Cardiologist:  Nicholas Noe, MD (Inactive) Nephrology: Dr. Valentino Foley HF Cardiologist: Nicholas Foley  Chief Complaint: Heart Failure follow-up    HPI: Nicholas Foley is a 72 y.o. with morbid obesity, chronic systolic heart failure, COPD, OSA, PAF, previously on amio-> stopped 2018,  HTN, LBBB, Medtronic CRT-P, and former smoker.   He has h/o NICM which seems to date back to 2014. Had cardiac cath in 1/20 with no CAD and well compensated filling pressures.    Admitted in 1/20 for CRT-P device in setting of LBBB. Intially he felt a lot better but has gradually declined.   Echo 01/30/20 EF 40-45% (read as 35-40%)  Echo EF 2/22 45% (in some images 45-50%) Limited by septal paradox.   Got COVID again in 1/22 but was a mild case.   Had recurrent AF. Underwent DC-CV 8/22.   Echo 04/12/22 showed EF 50-55%, LV dyysynchrony, grade I DD,  RV ok.   Developed recurrent AF in early 9/23. Seen in AF clinic. Amio started. Had DC-CV attempt on 08/13/22 which failed x 3. Successful DCCV 11/23 to NSR. Acute visit 12/23, NYHA II-III and volume up. Instructed to take metolazone 2.5/40 KCL x 1.  TEE 12/30/22 showing EF 55%, moderate LAE, RV mildy HK, moderate pericardial effusion and no LAA clot. Then had successful PVI. Started on colchicine and PPI.  Had AF ablation in 2/24  Echo 10/24 EF 45-50%, RV ok, aortic root dilation 41 mm  Today he returns for HF follow up. Overall feeling fine. Works out [employer] with a Psychologist, educational. Has occasional SOB when he over-does it. Fluid up and down. Frustrated with difficulty losing weight. Working on diet changes. Denies palpitations, abnormal bleeding, CP, dizziness, edema, or PND/Orthopnea. Appetite ok. No fever or  chills. Weight at home 295 pounds. Taking all medications. Asking about GLP1s, has new insurance now. Previously denied [employer]. Religiously wears CPAP.  Cardiac Studies - Echo (10/24): EF 45-50%, RV ok  - TEE (2/24): EF 55%, moderate LAE, RV mildy HK, moderate pericardial effusion and no LAA clot.  - Echo (5/23): EF 50-55%, severe LV dyysynchrony, grade I DD< RV ok,   - Echo (2/22): EF45%  - Echo (3/21): EF 40-45% (read as 35-40%).  - CPX (09/18/19):  Limited by obesity and mild heart failure Peak VO2 15.1  (80% predicted peak VO2) - when corrected to ibw pVO2 27.5 Slope 33 RER 1.02    - L/RHC (1/20) No coronary disease.  RA 13 PA 37/20 (27)  PCWP 14 CO 8.5 CI 3.3    - Echo (11/20): RV normal EF 45-50%   - Echo (11/19): EF 35-40% Grade II DD     Past Medical History:  Diagnosis Date   Atrial fibrillation with rapid ventricular response (HCC) 08/02/2013   CHF (congestive heart failure) (HCC)    Chronic systolic dysfunction of left ventricle 08/02/2013   BNP greater than 2000    COPD (chronic obstructive pulmonary disease) (HCC)    Moderate by PFTs with response to bronchodilators, May 2014   Erectile dysfunction    Hypertension    Insomnia 08/02/13   Left bundle branch block 08/02/2013   Morbid obesity (HCC) 08/02/2013   OSA (obstructive sleep apnea)  severe OSA with AHI 74/hr now on CPAP at 8cm H2O   Past Surgical History:  Procedure Laterality Date   ATRIAL FIBRILLATION ABLATION N/A 12/30/2022   Procedure: ATRIAL FIBRILLATION ABLATION;  Surgeon: Nicholas Prude, MD;  [facility]: [facility];  Service: Cardiovascular;  Laterality: N/A;   BIV PACEMAKER INSERTION CRT-P N/A 12/14/2018   Medtronic model [id number] Percepta Quad CRT-P MRI Surescan (serial Number [id number]) device implanted by Dr Nicholas Foley for CHF and LBBB   CARDIOVERSION N/A 08/06/2013   Procedure: CARDIOVERSION;  Surgeon: Nicholas Bunting, MD;  Location: [facility] ENDOSCOPY;  Service: Cardiovascular;   Laterality: N/A;  spoke with Nicholas Foley    CARDIOVERSION N/A 09/03/2013   Procedure: CARDIOVERSION;  Surgeon: Nicholas Noe, MD;  Location: [facility] ENDOSCOPY;  Service: Cardiovascular;  Laterality: N/A;   CARDIOVERSION N/A 07/21/2021   Procedure: CARDIOVERSION;  Surgeon: Nicholas Morale, MD;  Location: [facility] ENDOSCOPY;  Service: Cardiovascular;  Laterality: N/A;   CARDIOVERSION N/A 08/13/2022   Procedure: CARDIOVERSION;  Surgeon: Nicholas Stade, MD;  Location: [facility] ENDOSCOPY;  Service: Cardiovascular;  Laterality: N/A;   CARDIOVERSION N/A 09/03/2022   Procedure: CARDIOVERSION;  Surgeon: Nicholas Patty, MD;  Location: [facility] ENDOSCOPY;  Service: Cardiovascular;  Laterality: N/A;   CARDIOVERSION N/A 09/28/2022   Procedure: CARDIOVERSION;  Surgeon: Nicholas Lemming, MD;  Location: [facility] INVAS[facility] CV LAB;  Service: Cardiovascular;  Laterality: N/A;   chronic systolic dysfu     NASAL SEPTUM SURGERY     RIGHT/LEFT HEART CATH AND CORONARY ANGIOGRAPHY N/A 12/04/2018   Procedure: RIGHT/LEFT HEART CATH AND CORONARY ANGIOGRAPHY;  Surgeon: Nicholas Records, MD;  Location: [facility]VAS[facility] CV LAB;  Service: Cardiovascular;  Laterality: N/A;   TEE WITHOUT CARDIOVERSION N/A 08/06/2013   Procedure: TRANSESOPHAGEAL ECHOCARDIOGRAM (TEE);  Surgeon: Nicholas Bunting, MD;  Location: [facility] ENDOSCOPY;  Service: Cardiovascular;  Laterality: N/A;   TEE WITHOUT CARDIOVERSION N/A 12/30/2022   Procedure: TRANSESOPHAGEAL ECHOCARDIOGRAM (TEE);  Surgeon: Nicholas Patty, MD;  Location: [facility] INVASIVE CV LAB;  Service: Cardiovascular;  Laterality: N/A;   VASECTOMY       Current Outpatient Medications  Medication Sig Dispense Refill   acetaminophen (TYLENOL) 650 MG CR tablet Take 1,300 mg by mouth in the morning and at bedtime. Arthritis strength     ADVAIR DISKUS 250-50 MCG/DOSE AEPB Inhale 1 puff into the lungs 2 (two) times daily.     amiodarone (PACERONE) 100 MG tablet Take 1 tablet (100 mg total) by mouth daily. 90 tablet 3   apixaban  (ELIQUIS) 5 MG TABS tablet Take 1 tablet (5 mg total) by mouth 2 (two) times daily. NEEDS FOLLOW UP APPOINTMENT FOR MORE REFILL S 180 tablet 0   carvedilol (COREG) 25 MG tablet Take 1 tablet (25 mg total) by mouth 2 (two) times daily with a meal. (Patient taking differently: Take 25 mg by mouth daily.) 180 tablet 3   cholecalciferol (VITAMIN D3) 25 MCG (1000 UT) tablet Take 1,000 Units by mouth 2 times daily at 12 noon and 4 pm.     CIALIS 20 MG tablet Take 20 mg by mouth daily as needed for erectile dysfunction.   0   cyclobenzaprine (FLEXERIL) 10 MG tablet Take 10 mg by mouth 3 (three) times daily.     empagliflozin (JARDIANCE) 10 MG TABS tablet Take 1 tablet (10 mg total) by mouth daily before breakfast. 90 tablet 3   fluticasone (FLONASE) 50 MCG/ACT nasal spray Place 1 spray into both nostrils 2 (two) times daily.  0   furosemide (LASIX) 80 MG tablet Take 1 tablet (80 mg total) by mouth 2 (two) times daily. 360 tablet 3   gabapentin (NEURONTIN) 100 MG capsule Take 1 tablet at bedtime x 2 weeks then increase to 1 tablet twice daily. (Patient taking differently: Patient takes 2 capsules by mouth every night.) 60 capsule 3   Multiple Vitamins-Minerals (PRESERVISION AREDS PO) Take 1 capsule by mouth 2 (two) times daily.     omeprazole (PRILOSEC OTC) 20 MG tablet Take 20 mg by mouth daily.     potassium chloride SA (KLOR-CON M) 20 MEQ tablet TAKE 2 TABLETS(40 MEQ) BY MOUTH DAILY 180 tablet 1   sacubitril-valsartan (ENTRESTO) 24-26 MG Take 1 tablet by mouth 2 (two) times daily. 60 tablet 11   spironolactone (ALDACTONE) 25 MG tablet TAKE 1 TABLET(25 MG) BY MOUTH DAILY 90 tablet 1   tamsulosin (FLOMAX) 0.4 MG CAPS capsule Take 0.4 mg by mouth daily.     vitamin B-12 (CYANOCOBALAMIN) 1000 MCG tablet Take 1,000 mcg by mouth daily.     zolpidem (AMBIEN CR) 12.5 MG CR tablet Take 12.5 mg by mouth at bedtime as needed for sleep.     No current facility-administered medications for this encounter.     Allergies:   Ace inhibitors and Angiotensin receptor blockers   Social History:  The patient  reports that he quit smoking about 11 years ago. His smoking use included cigarettes and cigars. He started smoking about 54 years ago. He has a 64.5 pack-year smoking history. He has never used smokeless tobacco. He reports current alcohol use. He reports that he does not use drugs.   Family History:  The patient's family history includes Atrial fibrillation in his mother and sister; COPD in his father; Cerebral aneurysm in his brother; Healthy in his daughter; Heart failure in his father.   ROS:  Please see the history of present illness.   All other systems are personally reviewed and negative.   Recent Labs: 11/18/2022: B Natriuretic Peptide 41.7 07/22/2023: ALT 18; Hemoglobin 13.2; Magnesium 2.3; Platelets 247; TSH 2.570 10/04/2023: BUN 36; Creatinine, Ser 2.19; Potassium 4.4; Sodium 138  Personally reviewed   Wt Readings from Last 3 Encounters:  10/18/23 (!) 137.8 kg (303 lb 12.8 oz)  09/20/23 (!) 137.2 kg (302 lb 6.4 oz)  08/31/23 135.2 kg (298 lb)    BP 118/66   Pulse 85   Wt (!) 137.8 kg (303 lb 12.8 oz)   SpO2 97%   BMI 42.37 kg/m   Physical Exam General:  NAD. No resp difficulty, walked into clinic HEENT: Normal Neck: Supple. No JVD, thick neck Carotids 2+ bilat; no bruits. No lymphadenopathy or thryomegaly appreciated. Cor: PMI nondisplaced. Regular rate & rhythm. No rubs, gallops or murmurs. Lungs: Clear, diminished Abdomen: Soft, obese, nontender, nondistended. No hepatosplenomegaly. No bruits or masses. Good bowel sounds. Extremities: No cyanosis, clubbing, rash, edema Neuro: Alert & oriented x 3, cranial nerves grossly intact. Moves all 4 extremities w/o difficulty. Affect pleasant.  Device interrogation (personally reviewed): OptiVol up and down cyclically, thoracic impedence at baseline now. BIV pacing 98.2%, 3 hr day/activity, No AF  ASSESSMENT AND PLAN:  1.  Chronic Combined Systolic/Diastolic HF - NICM (thought due to LBBB). 12/2018 LHC no coronary disease.  - s/p Medtronic CRT-P - Echo (1/19) EF 35-40%. ECHO 11/19 EF 45-50%.  - Echo (11/20): EF read as 35-40% but with Definity I felt closed to 45% - Echo (3/21): EF read as 35-40% I think 40-45% -  Echo EF (2/22): EF 45% (in some images 45-50%) Limited by septal paradox. - CPX test (10/20) suboptimal effort mostly limited to obesity  - Echo (5/23) EF 50-55%, LV dyysynchrony, grade I DD, RV ok.  - TEE (2/24): EF 55%, moderate LAE, RV mildy HK - Echo (10/24): EF 45-50%, RV ok - Stable NYHA II-early III. Suspect waxing/waning symptoms related to fluctuations in volume status - Increase Entresto to 49/51 mg bid.  - Continue Lasix 80 mg bid. - Continue carvedilol 25 mg bid. Discussed taking BID. - Continue spiro 25 mg daily. - Continue Jadiance 10 mg daily. - Recent labs reviewed and are stable; K 4.4, SCr 2.19. Repeat labs in 2 weeks - ICD interrogate personally as above   2. PAF  - In the past he was on amiodarone. Stopped ~2017 - Underwent AF ablation 2/24  - Regular on exam - Continue amiodarone 100 mg daily. - Continue Eliquis 5 bid. No bleeding issues. - No AF on device interrogation today  3. OSA - Wears CPAP on a daily basis and even takes it with him when he is travelling    4. Morbid Obesity - Body mass index is 42.37 kg/m.  - Continue weight loss efforts.  - Insurance denied [employer]. Has new insurance now. Will re-refer (no dx of DM2 or CAD)  5. COPD  - No longer smoking.  6. CKD 3b - Baseline SCr 1.5-1.6 (most recent 2.19 on 10/04/23) - Followed by Dr. Valentino Foley - Continue SGLT2i. - Labs in 2 weeks as above.  Follow up in 3 months APP.  Anderson Malta [place], FNP  10/18/2023 11:28 AM  Advanced [facility] Failure [facility] [address] Heart and Vascular [place] [address] [phone] (office) [phone] (fax)

## 2023-10-18 ENCOUNTER — Encounter (HOSPITAL_COMMUNITY): Payer: Self-pay

## 2023-10-18 ENCOUNTER — Ambulatory Visit (HOSPITAL_COMMUNITY)
Admission: RE | Admit: 2023-10-18 | Discharge: 2023-10-18 | Disposition: A | Discharge status: Home / Self Care | Payer: Medicare Other | Source: Ambulatory Visit | Attending: Family Medicine | Admitting: Family Medicine

## 2023-10-18 VITALS — BP 118/66 | HR 85 | Wt 303.8 lb

## 2023-10-18 DIAGNOSIS — I5042 Chronic combined systolic (congestive) and diastolic (congestive) heart failure: Secondary | ICD-10-CM | POA: Diagnosis not present

## 2023-10-18 DIAGNOSIS — N1832 Chronic kidney disease, stage 3b: Secondary | ICD-10-CM | POA: Diagnosis not present

## 2023-10-18 DIAGNOSIS — I480 Paroxysmal atrial fibrillation: Secondary | ICD-10-CM | POA: Diagnosis not present

## 2023-10-18 DIAGNOSIS — Z87891 Personal history of nicotine dependence: Secondary | ICD-10-CM | POA: Insufficient documentation

## 2023-10-18 DIAGNOSIS — J449 Chronic obstructive pulmonary disease, unspecified: Secondary | ICD-10-CM

## 2023-10-18 DIAGNOSIS — I130 Hypertensive heart and chronic kidney disease with heart failure and stage 1 through stage 4 chronic kidney disease, or unspecified chronic kidney disease: Secondary | ICD-10-CM | POA: Insufficient documentation

## 2023-10-18 DIAGNOSIS — I447 Left bundle-branch block, unspecified: Secondary | ICD-10-CM | POA: Diagnosis not present

## 2023-10-18 DIAGNOSIS — Z7901 Long term (current) use of anticoagulants: Secondary | ICD-10-CM | POA: Diagnosis not present

## 2023-10-18 DIAGNOSIS — Z7984 Long term (current) use of oral hypoglycemic drugs: Secondary | ICD-10-CM | POA: Diagnosis not present

## 2023-10-18 DIAGNOSIS — Z79899 Other long term (current) drug therapy: Secondary | ICD-10-CM | POA: Diagnosis not present

## 2023-10-18 DIAGNOSIS — I428 Other cardiomyopathies: Secondary | ICD-10-CM | POA: Insufficient documentation

## 2023-10-18 DIAGNOSIS — I4819 Other persistent atrial fibrillation: Secondary | ICD-10-CM | POA: Diagnosis not present

## 2023-10-18 DIAGNOSIS — Z6841 Body Mass Index (BMI) 40.0 and over, adult: Secondary | ICD-10-CM | POA: Diagnosis not present

## 2023-10-18 DIAGNOSIS — G4733 Obstructive sleep apnea (adult) (pediatric): Secondary | ICD-10-CM

## 2023-10-18 MED ORDER — ENTRESTO 49-51 MG PO TABS: 1.0000 | ORAL_TABLET | Freq: Two times a day (BID) | ORAL | 6 refills | Status: DC

## 2023-10-18 NOTE — Patient Instructions (Addendum)
Thank you for coming in today  If you had labs drawn today, any labs that are abnormal the clinic will call you No news is good news  You have been referred to pharmacy, their office will call you for further appointment details    Medications: Increase entresto to 49/51 mg 1 tablet twice daily Take Coreg 25 mg 1 tablet twice daily  Follow up appointments:  Your physician recommends that you schedule a follow-up appointment in:  3 months in clinic   Do the following things EVERYDAY: Weigh yourself in the morning before breakfast. Write it down and keep it in a log. Take your medicines as prescribed Eat low salt foods--Limit salt (sodium) to 2000 mg per day.  Stay as active as you can everyday Limit all fluids for the day to less than 2 liters   At the [facility] Clinic, you and your health needs are our priority. As part of our continuing mission to provide you with exceptional heart care, we have created designated Provider Care Teams. These Care Teams include your primary Cardiologist (physician) and Advanced Practice Providers (APPs- Physician Assistants and Nurse Practitioners) who all work together to provide you with the care you need, when you need it.   You may see any of the following providers on your designated Care Team at your next follow up: Dr Arvilla Meres Dr Marca Ancona Dr. Marcos Eke, NP Robbie Lis, [place] [facility] [place], [place] Brynda Peon, NP Karle Plumber, PharmD   Please be sure to bring in all your medications bottles to every appointment.    Thank you for choosing [facility]  If you have any questions or concerns before your next appointment please send [place] a message through [place] or call our office at [phone].    TO LEAVE A MESSAGE FOR THE NURSE SELECT OPTION 2, PLEASE LEAVE A MESSAGE INCLUDING: YOUR NAME DATE OF BIRTH CALL BACK NUMBER REASON FOR  CALL**this is important as we prioritize the call backs  YOU WILL RECEIVE A CALL BACK THE SAME DAY AS LONG AS YOU CALL BEFORE 4:00 PM

## 2023-10-19 ENCOUNTER — Ambulatory Visit (INDEPENDENT_AMBULATORY_CARE_PROVIDER_SITE_OTHER): Payer: Medicare Other

## 2023-10-19 ENCOUNTER — Other Ambulatory Visit: Payer: Self-pay | Admitting: Internal Medicine

## 2023-10-19 ENCOUNTER — Other Ambulatory Visit (HOSPITAL_COMMUNITY): Payer: Self-pay

## 2023-10-19 DIAGNOSIS — I428 Other cardiomyopathies: Secondary | ICD-10-CM

## 2023-10-21 LAB — CUP PACEART REMOTE DEVICE CHECK
Battery Remaining Longevity: 43 mo
Battery Voltage: 2.94 V
Brady Statistic AP VP Percent: 0.06 %
Brady Statistic AP VS Percent: 0.01 %
Brady Statistic AS VP Percent: 98.25 %
Brady Statistic AS VS Percent: 1.68 %
Brady Statistic RA Percent Paced: 0.08 %
Brady Statistic RV Percent Paced: 89.43 %
Date Time Interrogation Session: 20241127212905
Implantable Lead Connection Status: 753985
Implantable Lead Connection Status: 753985
Implantable Lead Connection Status: 753985
Implantable Lead Implant Date: 20200123
Implantable Lead Implant Date: 20200123
Implantable Lead Implant Date: 20200123
Implantable Lead Location: 753858
Implantable Lead Location: 753859
Implantable Lead Location: 753860
Implantable Lead Model: 4598
Implantable Lead Model: 5076
Implantable Lead Model: 5076
Implantable Pulse Generator Implant Date: 20200123
Lead Channel Impedance Value: 1045 Ohm
Lead Channel Impedance Value: 1064 Ohm
Lead Channel Impedance Value: 1102 Ohm
Lead Channel Impedance Value: 1197 Ohm
Lead Channel Impedance Value: 1463 Ohm
Lead Channel Impedance Value: 1520 Ohm
Lead Channel Impedance Value: 342 Ohm
Lead Channel Impedance Value: 399 Ohm
Lead Channel Impedance Value: 418 Ohm
Lead Channel Impedance Value: 475 Ohm
Lead Channel Impedance Value: 532 Ohm
Lead Channel Impedance Value: 760 Ohm
Lead Channel Impedance Value: 817 Ohm
Lead Channel Impedance Value: 874 Ohm
Lead Channel Pacing Threshold Amplitude: 0.375 V
Lead Channel Pacing Threshold Amplitude: 0.75 V
Lead Channel Pacing Threshold Amplitude: 2.75 V
Lead Channel Pacing Threshold Pulse Width: 0.4 ms
Lead Channel Pacing Threshold Pulse Width: 0.4 ms
Lead Channel Pacing Threshold Pulse Width: 0.8 ms
Lead Channel Sensing Intrinsic Amplitude: 1.125 mV
Lead Channel Sensing Intrinsic Amplitude: 1.125 mV
Lead Channel Sensing Intrinsic Amplitude: 4.875 mV
Lead Channel Sensing Intrinsic Amplitude: 4.875 mV
Lead Channel Setting Pacing Amplitude: 1.5 V
Lead Channel Setting Pacing Amplitude: 2.5 V
Lead Channel Setting Pacing Amplitude: 3.75 V
Lead Channel Setting Pacing Pulse Width: 0.4 ms
Lead Channel Setting Pacing Pulse Width: 0.8 ms
Lead Channel Setting Sensing Sensitivity: 2 mV
Zone Setting Status: 755011
Zone Setting Status: 755011

## 2023-10-26 ENCOUNTER — Ambulatory Visit: Payer: Medicare Other | Attending: Cardiology | Admitting: Cardiology

## 2023-10-26 ENCOUNTER — Encounter: Payer: Self-pay | Admitting: Cardiology

## 2023-10-26 VITALS — BP 106/70 | HR 64 | Ht 71.0 in | Wt 301.0 lb

## 2023-10-26 DIAGNOSIS — Z950 Presence of cardiac pacemaker: Secondary | ICD-10-CM | POA: Insufficient documentation

## 2023-10-26 DIAGNOSIS — I5042 Chronic combined systolic (congestive) and diastolic (congestive) heart failure: Secondary | ICD-10-CM | POA: Diagnosis not present

## 2023-10-26 DIAGNOSIS — I4819 Other persistent atrial fibrillation: Secondary | ICD-10-CM | POA: Insufficient documentation

## 2023-10-26 NOTE — Patient Instructions (Signed)
Medication Instructions:  Your physician has recommended you make the following change in your medication:  1) STOP taking amiodarone  *If you need a refill on your cardiac medications before your next appointment, please call your pharmacy*  Follow-Up: At [facility], you and your health needs are our priority.  As part of our continuing mission to provide you with exceptional heart care, we have created designated Provider Care Teams.  These Care Teams include your primary Cardiologist (physician) and Advanced Practice Providers (APPs -  Physician Assistants and Nurse Practitioners) who all work together to provide you with the care you need, when you need it.  Your next appointment:   6 months  Provider:   You will see one of the following Advanced Practice Providers on your designated Care Team:   Francis Dowse, Charlott Holler [address]" [place], [place] Sherie Don, NP Canary Brim, NP

## 2023-10-26 NOTE — Progress Notes (Signed)
Electrophysiology Office Follow up Visit Note:    Date:  10/26/2023   ID:  Nicholas Foley, DOB [birth year], MRN [id number]  PCP:  Camie Patience, FNP  [facility] Cardiologist:  Lesleigh Noe, MD (Inactive)  [facility] Electrophysiologist:  Lanier Prude, MD    Interval History:     Nicholas Foley is a 72 y.o. male who presents for a follow up visit.  I last saw the patient Apr 20, 2019 for.  He had an A-fib ablation in February 2024.  At the last appointment we reduced his amiodarone to 100 mg by mouth once daily.  The plan was to eventually discontinue this medication altogether.  At our last appointment his heart failure symptoms were seemingly well-controlled.  He saw Rosalita Chessman in clinic July 22, 2023.  At that appointment he reported increasing palpitation episodes since reducing his amiodarone dose.  He Jacqlyn Larsen Dr. Gala Romney in clinic September 20, 2023.  At the appointment with Jesusita Oka he was in sinus rhythm.  He takes Eliquis for stroke prophylaxis There was no atrial fibrillation on device interrogation at the appointment with Dr. Gala Romney.   Discussed the use of AI scribe software for clinical note transcription with the patient, who gave verbal consent to proceed.  History of Present Illness   The patient, with a history of cardiac disease, presents for a routine follow-up. He reports feeling 'tired, worn out and short of breath' on some days, attributing these symptoms to his 'bad heart' and age. He is currently on amiodarone and Entresto, with the latter recently increased to a mid-level dose by another provider.  The patient also uses a CPAP machine for sleep apnea, which he reports using consistently, even during travel. He expresses a need for a new CPAP machine as his current one is 'worn out.'            Past medical, surgical, social and family history were reviewed.  ROS:   Please see the history of present illness.    All other systems reviewed and  are negative.  EKGs/Labs/Other Studies Reviewed:    The following studies were reviewed today:  October 26, 2023 in clinic device interrogation personally reviewed 0% A-fib burden Lead parameter stable No programming changes made today  September 20, 2023 echo EF 45-50 RV normal No MR  October 19, 2023 remote device interrogation personally reviewed BiV pacing 98.4%  July 22, 2023 EKG shows biventricular pacing 100%       Physical Exam:    VS:  BP 106/70   Pulse 64   Ht 5\' 11"  (1.803 m)   Wt (!) 301 lb (136.5 kg)   SpO2 97%   BMI 41.98 kg/m     Wt Readings from Last 3 Encounters:  10/26/23 (!) 301 lb (136.5 kg)  10/18/23 (!) 303 lb 12.8 oz (137.8 kg)  09/20/23 (!) 302 lb 6.4 oz (137.2 kg)     GEN: no distress.  Obese CARD: RRR, No MRG.  CIED pocket well-healed. RESP: No IWOB. CTAB.      ASSESSMENT:    1. Chronic combined systolic and diastolic heart failure (HCC)   2. Cardiac resynchronization therapy pacemaker (CRT-P) in place   3. Persistent atrial fibrillation (HCC)   4. Morbid obesity (HCC)    PLAN:    In order of problems listed above:  #Chronic systolic and diastolic heart failure #CRT in situ Doing well.  NYHA class II today.  Warm and dry on physical exam.  Follows with Dr. Gala Romney in the heart failure clinic.  Last EF 45%.  Has had a nice response to CRT. Continue GDMT Rhythm control indicated  #Persistent atrial fibrillation #High risk med monitoring-amiodarone Doing well after his atrial fibrillation ablation without sustained recurrence.  By device interrogation, marked reduction in A-fib burden. Continue anticoagulation  Stop amiodarone today.  #Morbid obesity I suspect his obesity is also contributing to his fatigue.    Follow-up 6 months with APP          Signed, Steffanie Dunn, MD, [facility], [facility] 10/26/2023 2:45 PM    Electrophysiology [facility]

## 2023-11-01 ENCOUNTER — Other Ambulatory Visit (HOSPITAL_COMMUNITY): Payer: Medicare Other

## 2023-11-03 NOTE — Telephone Encounter (Signed)
Message sent to [employer] [facility] asking why patient never got his cpap machine.

## 2023-11-04 ENCOUNTER — Encounter: Payer: Self-pay | Admitting: Cardiology

## 2023-11-04 ENCOUNTER — Telehealth (HOSPITAL_COMMUNITY): Payer: Self-pay

## 2023-11-04 ENCOUNTER — Ambulatory Visit (HOSPITAL_COMMUNITY)
Admission: RE | Admit: 2023-11-04 | Discharge: 2023-11-04 | Disposition: A | Discharge status: Home / Self Care | Payer: Medicare Other | Source: Ambulatory Visit | Attending: Cardiology | Admitting: Cardiology

## 2023-11-04 DIAGNOSIS — I5042 Chronic combined systolic (congestive) and diastolic (congestive) heart failure: Secondary | ICD-10-CM | POA: Diagnosis present

## 2023-11-04 LAB — BASIC METABOLIC PANEL
Anion gap: 9 (ref 5–15)
BUN: 43 mg/dL — ABNORMAL HIGH (ref 8–23)
CO2: 23 mmol/L (ref 22–32)
Calcium: 8.9 mg/dL (ref 8.9–10.3)
Chloride: 105 mmol/L (ref 98–111)
Creatinine, Ser: 1.98 mg/dL — ABNORMAL HIGH (ref 0.61–1.24)
GFR, Estimated: 36 mL/min — ABNORMAL LOW (ref >60)
Glucose, Bld: 128 mg/dL — ABNORMAL HIGH (ref 70–99)
Potassium: 5.2 mmol/L — ABNORMAL HIGH (ref 3.5–5.1)
Sodium: 137 mmol/L (ref 135–145)

## 2023-11-04 LAB — BRAIN NATRIURETIC PEPTIDE: B Natriuretic Peptide: 27.6 pg/mL (ref 0.0–100.0)

## 2023-11-04 NOTE — Telephone Encounter (Signed)
Me     11/04/23  2:24 PM Result Note Spoke with patient regarding the following results. Patient made aware and patient verbalized understanding. Patient scheduled for repeat labs- and labs ordered.

## 2023-11-04 NOTE — Telephone Encounter (Signed)
-----   Message from Anderson Malta [place] sent at 11/04/2023 12:06 PM EST ----- K is mildly elevated, but looks like sample may have been hemolyzed.  No change to meds for now.  Please repeat BMET in 10-14 days to follow

## 2023-11-05 LAB — CUP PACEART INCLINIC DEVICE CHECK
Date Time Interrogation Session: 20241204143755
Implantable Lead Connection Status: 753985
Implantable Lead Connection Status: 753985
Implantable Lead Connection Status: 753985
Implantable Lead Implant Date: 20200123
Implantable Lead Implant Date: 20200123
Implantable Lead Implant Date: 20200123
Implantable Lead Location: 753858
Implantable Lead Location: 753859
Implantable Lead Location: 753860
Implantable Lead Model: 4598
Implantable Lead Model: 5076
Implantable Lead Model: 5076
Implantable Pulse Generator Implant Date: 20200123

## 2023-11-08 NOTE — Telephone Encounter (Signed)
Per community message with Luellen Pucker ([employer]) [place], Madison Hickman, CMA; Thornell Sartorius,  Please see note below :  [id number] Luellen Pucker 09/13/2023 2:31:52 PM Patient CSR Note Closed   Spoke to Chilton [place] info from system of needing f65f but also date he recieved last pap ( 11/21/2018). INs follows medicare guidelines he will not be elg. for new pap until late Dec 2024 or 11/23/2023.  He Did get supplies on 09/26/23.  Thank you,  Luellen Pucker

## 2023-11-14 ENCOUNTER — Ambulatory Visit: Payer: Medicare Other | Attending: Cardiology

## 2023-11-14 DIAGNOSIS — I5042 Chronic combined systolic (congestive) and diastolic (congestive) heart failure: Secondary | ICD-10-CM

## 2023-11-14 DIAGNOSIS — Z950 Presence of cardiac pacemaker: Secondary | ICD-10-CM

## 2023-11-15 ENCOUNTER — Telehealth: Payer: Self-pay

## 2023-11-15 NOTE — Telephone Encounter (Signed)
 Remote ICM transmission received.  Attempted call to patient regarding ICM remote transmission and left detailed message per DPR.  Left ICM phone number and advised to return call for any fluid symptoms or questions. Next ICM remote transmission scheduled 11/28/2023.

## 2023-11-15 NOTE — Progress Notes (Signed)
EPIC Encounter for ICM Monitoring  Patient Name: Nicholas Foley is a 72 y.o. male Date: 11/15/2023 Primary Care Physican: Camie Patience, FNP Primary Cardiologist: Smith/Bensimhon Electrophysiologist: Lalla Brothers Nephrologist: [facility]: Dr Valentino Nose Bi-V Pacing:  98.3% 01/03/2023 Weight: 299 lbs 01/05/2023 Weight: 297 lbs 02/09/2023 Weight: 296 lbs 03/15/2023 Weight: 292 lbs 08/02/2023 Weight: 291 lbs    Since 20-Sep-2023 Time in AT/AF <0.1 hr/day (<0.1%)         Attempted call to patient and unable to reach.  Left detailed message per DPR regarding transmission.  Transmission results reviewed.             Optivol thoracic impedance suggesting possible fluid accumulation starting 12/16.      Prescribed: Furosemide 80 mg take 1 tablet(s) (80 mg total) twice a day.   Potassium 20 mEq take 2 tablet(s) daily Spironolactone 25 mg take 1 tablet daily   Labs: 10/04/2023 Creatinine 2.19, BUN 36, Potassium 4.4, Sodium 138, GFR 32 07/22/2023 Creatinine 1.86, BUN 34, Potassium 4.4, Sodium 129, GFR 38 06/08/2023 Creatinine 2.03, BUN 33, Potassium 4.9, Sodium 136, GFR 35 01/03/2023 Creatinine 1.75, BUN 23, Potassium 3.8, Sodium 137, GFR 42  12/08/2022 Creatinine 1.84, BUN 32, Potassium 4.2, Sodium 137  A complete set of results can be found in Results Review.   Recommendations:  Left voice mail with ICM number and encouraged to call if experiencing any fluid symptoms.   Follow-up plan: ICM clinic phone appointment on 11/28/2023 to recheck fluid levels.   91 day device clinic remote transmission 01/18/2024.     EP/Cardiology Office Visits:   Recall 04/23/2024 with Dr Lalla Brothers or APP.   01/18/2024 with HF clinic.     Copy of ICM check sent to Dr Lalla Brothers.    3 month ICM trend: 11/14/2023.    12-14 Month ICM trend:     Karie Soda, RN 11/15/2023 9:32 AM

## 2023-11-18 ENCOUNTER — Ambulatory Visit (HOSPITAL_COMMUNITY)
Admission: RE | Admit: 2023-11-18 | Discharge: 2023-11-18 | Disposition: A | Discharge status: Home / Self Care | Payer: Medicare Other | Source: Ambulatory Visit | Attending: Cardiology | Admitting: Cardiology

## 2023-11-18 DIAGNOSIS — I5042 Chronic combined systolic (congestive) and diastolic (congestive) heart failure: Secondary | ICD-10-CM | POA: Diagnosis present

## 2023-11-18 LAB — BASIC METABOLIC PANEL
Anion gap: 9 (ref 5–15)
BUN: 40 mg/dL — ABNORMAL HIGH (ref 8–23)
CO2: 21 mmol/L — ABNORMAL LOW (ref 22–32)
Calcium: 8.8 mg/dL — ABNORMAL LOW (ref 8.9–10.3)
Chloride: 106 mmol/L (ref 98–111)
Creatinine, Ser: 1.94 mg/dL — ABNORMAL HIGH (ref 0.61–1.24)
GFR, Estimated: 37 mL/min — ABNORMAL LOW (ref >60)
Glucose, Bld: 110 mg/dL — ABNORMAL HIGH (ref 70–99)
Potassium: 4.7 mmol/L (ref 3.5–5.1)
Sodium: 136 mmol/L (ref 135–145)

## 2023-11-24 ENCOUNTER — Encounter: Payer: Self-pay | Admitting: Cardiology

## 2023-11-28 ENCOUNTER — Ambulatory Visit: Payer: Medicare Other | Attending: Cardiology

## 2023-11-28 DIAGNOSIS — I5042 Chronic combined systolic (congestive) and diastolic (congestive) heart failure: Secondary | ICD-10-CM

## 2023-11-28 DIAGNOSIS — Z950 Presence of cardiac pacemaker: Secondary | ICD-10-CM

## 2023-11-28 NOTE — Progress Notes (Signed)
 EPIC Encounter for ICM Monitoring  Patient Name: Nicholas Foley is a 73 y.o. male Date: 11/28/2023 Primary Care Physican: Dyane Anthony RAMAN, FNP Primary Cardiologist: Smith/Bensimhon Electrophysiologist: Cindie Nephrologist: [facility]: Dr Macel Bi-V Pacing:  98.3% 01/03/2023 Weight: 299 lbs 01/05/2023 Weight: 297 lbs 02/09/2023 Weight: 296 lbs 03/15/2023 Weight: 292 lbs 08/02/2023 Weight: 291 lbs    Since 13-Nov-2023 Time in AT/AF <0.1 hr/day (<0.1%)         Spoke with patient and heart failure questions reviewed.  Transmission results reviewed.  Pt asymptomatic for fluid accumulation.  He does have very painful sore feet and making it hard to walk. He has been to the physician but did not get a diagnosis.  He will go back to the physician tomorrow if no improvement.          Optivol thoracic impedance suggesting possible fluid accumulation starting 1/2.      Prescribed: Furosemide  80 mg take 1 tablet(s) (80 mg total) twice a day.   Potassium 20 mEq take 2 tablet(s) daily Spironolactone  25 mg take 1 tablet daily   Labs: 11/18/2023 Creatinine 1.94, BUN 40, Potassium 4.7, Sodium 136, GFR 37 11/04/2023 Creatinine 1.98, BUN 43, Potassium 5.2, Sodium 137, GFR 36 10/04/2023 Creatinine 2.19, BUN 36, Potassium 4.4, Sodium 138, GFR 32 07/22/2023 Creatinine 1.86, BUN 34, Potassium 4.4, Sodium 129, GFR 38 06/08/2023 Creatinine 2.03, BUN 33, Potassium 4.9, Sodium 136, GFR 35 01/03/2023 Creatinine 1.75, BUN 23, Potassium 3.8, Sodium 137, GFR 42  12/08/2022 Creatinine 1.84, BUN 32, Potassium 4.2, Sodium 137  A complete set of results can be found in Results Review.   Recommendations:   He work on getting fluid retention resolved.  He has not been as active due to pain in feet.   .   Follow-up plan: ICM clinic phone appointment on 12/05/2023 to recheck fluid levels.   91 day device clinic remote transmission 01/18/2024.     EP/Cardiology Office Visits:   Recall 04/23/2024 with Dr  Cindie or APP.   01/18/2024 with HF clinic.     Copy of ICM check sent to Dr Cindie.    3 month ICM trend: 11/28/2023.    12-14 Month ICM trend:     [name], RN 11/28/2023 2:01 PM

## 2023-12-02 ENCOUNTER — Other Ambulatory Visit: Payer: Self-pay | Admitting: *Deleted

## 2023-12-02 DIAGNOSIS — Z122 Encounter for screening for malignant neoplasm of respiratory organs: Secondary | ICD-10-CM

## 2023-12-02 DIAGNOSIS — Z87891 Personal history of nicotine dependence: Secondary | ICD-10-CM

## 2023-12-05 ENCOUNTER — Ambulatory Visit: Payer: Medicare Other | Attending: Cardiology

## 2023-12-05 DIAGNOSIS — Z950 Presence of cardiac pacemaker: Secondary | ICD-10-CM

## 2023-12-05 DIAGNOSIS — I5042 Chronic combined systolic (congestive) and diastolic (congestive) heart failure: Secondary | ICD-10-CM

## 2023-12-07 NOTE — Progress Notes (Signed)
Spoke with patient he has been taking Oxycontin for foot pain since 1/5 and has not felt well since then.    His fluid intake has been <64 oz for this past week.    Discussed transmission results and advised report suggesting dryness since 1/9 but was fluid levels were balanced prior to 1/5.  Advised dehydration may be causing him to feel weak and he also experiencing some SOB.    Fluid levels have been balanced prior to 1/9.    He is still having some foot pain. PCP was not 100% sure if he was having gout.    Advised to the 80 mg Furosemide dosage this afternoon only then resume prescribed dosages tomorrow on 1/16.  Advised to increase fluid intake today and tomorrow.   Starting Friday, he needs to continue to drink 64 oz fluid to help with hydration.   Will recheck fluid levels on 1/21.  Copy sent to Dr Gala Romney for review and any further recommendations.    Updated report for 12/07/2023

## 2023-12-07 NOTE — Progress Notes (Signed)
EPIC Encounter for ICM Monitoring  Patient Name: Nicholas Foley is a 73 y.o. male Date: 12/07/2023 Primary Care Physican: Camie Patience, FNP Primary Cardiologist: Smith/Bensimhon Electrophysiologist: Lalla Brothers Nephrologist: [facility]: Dr Valentino Nose Bi-V Pacing:  98.0% 01/03/2023 Weight: 299 lbs 01/05/2023 Weight: 297 lbs 02/09/2023 Weight: 296 lbs 03/15/2023 Weight: 292 lbs 08/02/2023 Weight: 291 lbs    Since 27-Nov-2023 Time in AT/AF  0.0 hr/day (0.0%)         Spoke with wife per DPR, pt was unavailable.  She said patient is still not feeling well.  He is weak and having difficulty walking.  PCP last week thought the bilateral foot pain was due to gout and he did take a round of Prednisone. He has not been able to work for the past week.  Discussed remote transmission and advised weakness can be a sign of dehydration.           Optivol thoracic impedance suggesting possible dryness starting 1/9.      Prescribed: Furosemide 80 mg take 1 tablet(s) (80 mg total) twice a day.   Potassium 20 mEq take 2 tablet(s) daily Spironolactone 25 mg take 1 tablet daily   Labs: 11/18/2023 Creatinine 1.94, BUN 40, Potassium 4.7, Sodium 136, GFR 37 11/04/2023 Creatinine 1.98, BUN 43, Potassium 5.2, Sodium 137, GFR 36 10/04/2023 Creatinine 2.19, BUN 36, Potassium 4.4, Sodium 138, GFR 32 07/22/2023 Creatinine 1.86, BUN 34, Potassium 4.4, Sodium 129, GFR 38 06/08/2023 Creatinine 2.03, BUN 33, Potassium 4.9, Sodium 136, GFR 35 01/03/2023 Creatinine 1.75, BUN 23, Potassium 3.8, Sodium 137, GFR 42  12/08/2022 Creatinine 1.84, BUN 32, Potassium 4.2, Sodium 137  A complete set of results can be found in Results Review.   Recommendations:   Advised to have patient send an update report for 1/15 for review and she will have him do so.     Follow-up plan: ICM clinic phone appointment on 12/26/2023.   91 day device clinic remote transmission 01/18/2024.     EP/Cardiology Office Visits:   Recall  04/23/2024 with Dr Lalla Brothers or APP.   01/18/2024 with HF clinic.     Copy of ICM check sent to Dr Lalla Brothers.    3 month ICM trend: 12/04/2023.    12-14 Month ICM trend:     Karie Soda, RN 12/07/2023 8:40 AM

## 2023-12-08 ENCOUNTER — Telehealth: Payer: Self-pay | Admitting: *Deleted

## 2023-12-08 DIAGNOSIS — I10 Essential (primary) hypertension: Secondary | ICD-10-CM

## 2023-12-08 DIAGNOSIS — G4733 Obstructive sleep apnea (adult) (pediatric): Secondary | ICD-10-CM

## 2023-12-08 DIAGNOSIS — R0683 Snoring: Secondary | ICD-10-CM

## 2023-12-08 DIAGNOSIS — I5042 Chronic combined systolic (congestive) and diastolic (congestive) heart failure: Secondary | ICD-10-CM

## 2023-12-08 DIAGNOSIS — R5383 Other fatigue: Secondary | ICD-10-CM

## 2023-12-08 NOTE — Telephone Encounter (Signed)
Rx has to be within the last 90 days   Order placed to [facility] order ResMed auto CPAP with following settings:  AutoSet  Min Pressure 7 cmH2O  Max Pressure 15 cmH2O  EPR Fulltime  EPR level 2  With heated humidity

## 2023-12-09 NOTE — Progress Notes (Signed)
Spoke with patient and he is feeling a little better.  He still has some weakness but dizziness has resolved.  He is walking better around the house.  He is improving.  Advised will recheck fluid levels on 1/21.

## 2023-12-13 ENCOUNTER — Ambulatory Visit: Payer: Medicare Other | Attending: Cardiology

## 2023-12-13 DIAGNOSIS — I5042 Chronic combined systolic (congestive) and diastolic (congestive) heart failure: Secondary | ICD-10-CM

## 2023-12-13 DIAGNOSIS — Z950 Presence of cardiac pacemaker: Secondary | ICD-10-CM

## 2023-12-13 NOTE — Progress Notes (Signed)
EPIC Encounter for ICM Monitoring  Patient Name: Nicholas Foley is a 73 y.o. male Date: 12/13/2023 Primary Care Physican: Camie Patience, FNP Primary Cardiologist: Smith/Bensimhon Electrophysiologist: Lalla Brothers Nephrologist: [facility]: Dr Valentino Nose Bi-V Pacing:  98.3% 01/03/2023 Weight: 299 lbs 01/05/2023 Weight: 297 lbs 02/09/2023 Weight: 296 lbs 03/15/2023 Weight: 292 lbs 08/02/2023 Weight: 291 lbs    Since 07-Dec-2023 Time in AT/AF  0.0 hr/day (0.0%)         Spoke with patient.  He is feeling better and returned to work.  He continues to have some ankle pain but is working through it.  He thinks the Oxycontin he was taking for ankle pain disagreed with him and will not be taking it anymore.            Optivol thoracic impedance suggesting fluid levels returned to normal.      Prescribed: Furosemide 80 mg take 1 tablet(s) (80 mg total) twice a day.   Potassium 20 mEq take 2 tablet(s) daily Spironolactone 25 mg take 1 tablet daily   Labs: 11/18/2023 Creatinine 1.94, BUN 40, Potassium 4.7, Sodium 136, GFR 37 11/04/2023 Creatinine 1.98, BUN 43, Potassium 5.2, Sodium 137, GFR 36 10/04/2023 Creatinine 2.19, BUN 36, Potassium 4.4, Sodium 138, GFR 32 07/22/2023 Creatinine 1.86, BUN 34, Potassium 4.4, Sodium 129, GFR 38 06/08/2023 Creatinine 2.03, BUN 33, Potassium 4.9, Sodium 136, GFR 35 01/03/2023 Creatinine 1.75, BUN 23, Potassium 3.8, Sodium 137, GFR 42  12/08/2022 Creatinine 1.84, BUN 32, Potassium 4.2, Sodium 137  A complete set of results can be found in Results Review.   Recommendations:   No changes and encouraged to call if experiencing any fluid symptoms.   Follow-up plan: ICM clinic phone appointment on 12/26/2023.   91 day device clinic remote transmission 01/18/2024.     EP/Cardiology Office Visits:   Recall 04/23/2024 with Dr Lalla Brothers or APP.   01/18/2024 with HF clinic.     Copy of ICM check sent to Dr Lalla Brothers.   3 month ICM trend: 12/13/2023.    12-14 Month  ICM trend:     Karie Soda, RN 12/13/2023 3:46 PM

## 2023-12-26 ENCOUNTER — Ambulatory Visit: Payer: Medicare Other | Attending: Cardiology

## 2023-12-26 ENCOUNTER — Telehealth: Payer: Self-pay

## 2023-12-26 DIAGNOSIS — Z950 Presence of cardiac pacemaker: Secondary | ICD-10-CM | POA: Diagnosis not present

## 2023-12-26 DIAGNOSIS — I5042 Chronic combined systolic (congestive) and diastolic (congestive) heart failure: Secondary | ICD-10-CM | POA: Diagnosis not present

## 2023-12-26 NOTE — Progress Notes (Signed)
EPIC Encounter for ICM Monitoring  Patient Name: Nicholas Foley is a 73 y.o. male Date: 12/26/2023 Primary Care Physican: Camie Patience, FNP Primary Cardiologist: Smith/Bensimhon Electrophysiologist: Lalla Brothers Nephrologist: [facility]: Dr Valentino Nose Bi-V Pacing:  98.1% 01/03/2023 Weight: 299 lbs 01/05/2023 Weight: 297 lbs 02/09/2023 Weight: 296 lbs 03/15/2023 Weight: 292 lbs 08/02/2023 Weight: 291 lbs    Since 13-Dec-2023 Time in AT/AF 0.0 hr/day (0.0%)        Attempted call to patient and unable to reach.  Left detailed message per DPR regarding transmission.  Transmission results reviewed.          Optivol thoracic impedance suggesting possible fluid accumulation starting 1/30.      Prescribed: Furosemide 80 mg take 1 tablet(s) (80 mg total) twice a day.   Potassium 20 mEq take 2 tablet(s) daily Spironolactone 25 mg take 1 tablet daily   Labs: 11/18/2023 Creatinine 1.94, BUN 40, Potassium 4.7, Sodium 136, GFR 37 11/04/2023 Creatinine 1.98, BUN 43, Potassium 5.2, Sodium 137, GFR 36 10/04/2023 Creatinine 2.19, BUN 36, Potassium 4.4, Sodium 138, GFR 32 07/22/2023 Creatinine 1.86, BUN 34, Potassium 4.4, Sodium 129, GFR 38 06/08/2023 Creatinine 2.03, BUN 33, Potassium 4.9, Sodium 136, GFR 35 01/03/2023 Creatinine 1.75, BUN 23, Potassium 3.8, Sodium 137, GFR 42  12/08/2022 Creatinine 1.84, BUN 32, Potassium 4.2, Sodium 137  A complete set of results can be found in Results Review.   Recommendations:   Left voice mail with ICM number and encouraged to call if experiencing any fluid symptoms.  Pt takes extra Furosemide if needed to help with fluid accumulation.   Follow-up plan: ICM clinic phone appointment on 01/03/2024 to recheck fluid levels.   91 day device clinic remote transmission 01/18/2024.     EP/Cardiology Office Visits:   Recall 04/23/2024 with Dr Lalla Brothers or APP.   01/18/2024 with HF clinic.     Copy of ICM check sent to Dr Lalla Brothers.    3 month ICM trend:  12/26/2023.    12-14 Month ICM trend:     Karie Soda, RN 12/26/2023 4:03 PM

## 2023-12-26 NOTE — Telephone Encounter (Signed)
Remote ICM transmission received.  Attempted call to patient regarding ICM remote transmission and left detailed message per DPR.  Left ICM phone number and advised to return call for any fluid symptoms or questions. Next ICM remote transmission scheduled 01/03/2024.

## 2023-12-27 ENCOUNTER — Other Ambulatory Visit: Payer: Self-pay | Admitting: Neurology

## 2023-12-28 ENCOUNTER — Telehealth: Payer: Self-pay | Admitting: Neurology

## 2023-12-28 NOTE — Telephone Encounter (Signed)
 Pt called in stating he has been taking gabapentin  and it has been working really well. He tried to get a refill and there are not any. His next appointment is 01/18/24 and wants to follow up to see if another refill could be sent in or if he is supposed to stop taking it until he see's her?

## 2023-12-29 ENCOUNTER — Other Ambulatory Visit (HOSPITAL_COMMUNITY): Payer: Self-pay

## 2023-12-29 ENCOUNTER — Other Ambulatory Visit: Payer: Self-pay

## 2023-12-29 MED ORDER — GABAPENTIN 100 MG PO CAPS: 200.0000 mg | ORAL_CAPSULE | Freq: Every day | ORAL | 3 refills | Status: DC

## 2023-12-29 MED ORDER — GABAPENTIN 100 MG PO CAPS
200.0000 mg | ORAL_CAPSULE | Freq: Every day | ORAL | 3 refills | Status: DC
  Filled 2023-12-29 (×2): qty 180, 90d supply, fill #0

## 2023-12-29 NOTE — Addendum Note (Signed)
 Addended by: Rox Cope A on: 12/29/2023 01:21 PM   Modules accepted: Orders

## 2023-12-29 NOTE — Telephone Encounter (Signed)
Resent. Thank you.

## 2023-12-29 NOTE — Telephone Encounter (Signed)
 Patient called in and stated Gabapentin  was sent to the wrong pharmacy. He needs rx sent to: [id number] - [place], [place] - [address] AT [facility] [address] [facility] [address]

## 2023-12-29 NOTE — Telephone Encounter (Signed)
 Called patient and left a detailed message per DPR that Gabapentin  was sent to pharmacy.

## 2023-12-29 NOTE — Telephone Encounter (Signed)
 Rx refilled.

## 2023-12-29 NOTE — Addendum Note (Signed)
 Addended by: Rayhana Slider K on: 12/29/2023 02:59 PM   Modules accepted: Orders

## 2024-01-03 ENCOUNTER — Ambulatory Visit: Payer: Medicare Other | Attending: Cardiology

## 2024-01-03 DIAGNOSIS — Z950 Presence of cardiac pacemaker: Secondary | ICD-10-CM

## 2024-01-03 DIAGNOSIS — I5042 Chronic combined systolic (congestive) and diastolic (congestive) heart failure: Secondary | ICD-10-CM

## 2024-01-04 ENCOUNTER — Ambulatory Visit: Payer: 59 | Admitting: Neurology

## 2024-01-04 NOTE — Progress Notes (Signed)
EPIC Encounter for ICM Monitoring  Patient Name: Nicholas Foley is a 73 y.o. male Date: 01/04/2024 Primary Care Physican: Camie Patience, FNP Primary Cardiologist: Smith/Bensimhon Electrophysiologist: Lalla Brothers Nephrologist: [facility]: Dr Nita Sickle Pacing:  98.4% 01/03/2023 Weight: 299 lbs 01/05/2023 Weight: 297 lbs 02/09/2023 Weight: 296 lbs 03/15/2023 Weight: 292 lbs 08/02/2023 Weight: 291 lbs 10/26/2023 Office Weight: 301 lbs    Since 25-Dec-2023 Time in AT/AF 0.0 hr/day (0.0%)        Transmission results reviewed.          Optivol thoracic impedance suggesting fluid levels returned to normal.     Prescribed: Furosemide 80 mg take 1 tablet(s) (80 mg total) twice a day.   Potassium 20 mEq take 2 tablet(s) daily Spironolactone 25 mg take 1 tablet daily   Labs: 11/18/2023 Creatinine 1.94, BUN 40, Potassium 4.7, Sodium 136, GFR 37 11/04/2023 Creatinine 1.98, BUN 43, Potassium 5.2, Sodium 137, GFR 36 10/04/2023 Creatinine 2.19, BUN 36, Potassium 4.4, Sodium 138, GFR 32 07/22/2023 Creatinine 1.86, BUN 34, Potassium 4.4, Sodium 129, GFR 38 06/08/2023 Creatinine 2.03, BUN 33, Potassium 4.9, Sodium 136, GFR 35 01/03/2023 Creatinine 1.75, BUN 23, Potassium 3.8, Sodium 137, GFR 42  12/08/2022 Creatinine 1.84, BUN 32, Potassium 4.2, Sodium 137  A complete set of results can be found in Results Review.   Recommendations:   No changes.   Follow-up plan: ICM clinic phone appointment on 01/30/2024.   91 day device clinic remote transmission 01/18/2024.     EP/Cardiology Office Visits:   Recall 04/23/2024 with Dr Lalla Brothers or APP.   01/18/2024 with HF clinic.     Copy of ICM check sent to Dr Lalla Brothers.    3 month ICM trend: 01/02/2024.    12-14 Month ICM trend:     Karie Soda, RN 01/04/2024 12:09 PM

## 2024-01-17 ENCOUNTER — Telehealth (HOSPITAL_COMMUNITY): Payer: Self-pay

## 2024-01-17 NOTE — Telephone Encounter (Signed)
 Called and left patient a voice message to confirm/remind patient of their appointment at the [facility] on 01/18/24.   And to bring in all medications and/or complete list.

## 2024-01-18 ENCOUNTER — Ambulatory Visit (INDEPENDENT_AMBULATORY_CARE_PROVIDER_SITE_OTHER): Payer: 59

## 2024-01-18 ENCOUNTER — Ambulatory Visit (INDEPENDENT_AMBULATORY_CARE_PROVIDER_SITE_OTHER): Payer: Medicare Other | Admitting: Neurology

## 2024-01-18 ENCOUNTER — Ambulatory Visit (HOSPITAL_COMMUNITY)
Admission: RE | Admit: 2024-01-18 | Discharge: 2024-01-18 | Disposition: A | Discharge status: Home / Self Care | Payer: Medicare Other | Source: Ambulatory Visit | Attending: Family Medicine | Admitting: Family Medicine

## 2024-01-18 ENCOUNTER — Encounter (HOSPITAL_COMMUNITY): Payer: Self-pay

## 2024-01-18 VITALS — BP 138/76 | HR 81 | Wt 306.2 lb

## 2024-01-18 VITALS — BP 114/69 | HR 88 | Ht 71.0 in | Wt 306.0 lb

## 2024-01-18 DIAGNOSIS — Z7984 Long term (current) use of oral hypoglycemic drugs: Secondary | ICD-10-CM | POA: Insufficient documentation

## 2024-01-18 DIAGNOSIS — J449 Chronic obstructive pulmonary disease, unspecified: Secondary | ICD-10-CM | POA: Diagnosis not present

## 2024-01-18 DIAGNOSIS — R5383 Other fatigue: Secondary | ICD-10-CM | POA: Insufficient documentation

## 2024-01-18 DIAGNOSIS — Z7901 Long term (current) use of anticoagulants: Secondary | ICD-10-CM | POA: Insufficient documentation

## 2024-01-18 DIAGNOSIS — N1832 Chronic kidney disease, stage 3b: Secondary | ICD-10-CM | POA: Diagnosis not present

## 2024-01-18 DIAGNOSIS — I5022 Chronic systolic (congestive) heart failure: Secondary | ICD-10-CM | POA: Insufficient documentation

## 2024-01-18 DIAGNOSIS — Z8616 Personal history of COVID-19: Secondary | ICD-10-CM | POA: Insufficient documentation

## 2024-01-18 DIAGNOSIS — R0602 Shortness of breath: Secondary | ICD-10-CM | POA: Diagnosis not present

## 2024-01-18 DIAGNOSIS — I428 Other cardiomyopathies: Secondary | ICD-10-CM

## 2024-01-18 DIAGNOSIS — Z79899 Other long term (current) drug therapy: Secondary | ICD-10-CM | POA: Diagnosis not present

## 2024-01-18 DIAGNOSIS — R202 Paresthesia of skin: Secondary | ICD-10-CM

## 2024-01-18 DIAGNOSIS — Z87891 Personal history of nicotine dependence: Secondary | ICD-10-CM | POA: Diagnosis not present

## 2024-01-18 DIAGNOSIS — I130 Hypertensive heart and chronic kidney disease with heart failure and stage 1 through stage 4 chronic kidney disease, or unspecified chronic kidney disease: Secondary | ICD-10-CM | POA: Diagnosis not present

## 2024-01-18 DIAGNOSIS — I447 Left bundle-branch block, unspecified: Secondary | ICD-10-CM | POA: Diagnosis not present

## 2024-01-18 DIAGNOSIS — G4733 Obstructive sleep apnea (adult) (pediatric): Secondary | ICD-10-CM | POA: Diagnosis not present

## 2024-01-18 DIAGNOSIS — Z6841 Body Mass Index (BMI) 40.0 and over, adult: Secondary | ICD-10-CM | POA: Insufficient documentation

## 2024-01-18 DIAGNOSIS — I480 Paroxysmal atrial fibrillation: Secondary | ICD-10-CM | POA: Insufficient documentation

## 2024-01-18 DIAGNOSIS — I5042 Chronic combined systolic (congestive) and diastolic (congestive) heart failure: Secondary | ICD-10-CM | POA: Insufficient documentation

## 2024-01-18 LAB — BASIC METABOLIC PANEL
Anion gap: 13 (ref 5–15)
BUN: 42 mg/dL — ABNORMAL HIGH (ref 8–23)
CO2: 22 mmol/L (ref 22–32)
Calcium: 9 mg/dL (ref 8.9–10.3)
Chloride: 101 mmol/L (ref 98–111)
Creatinine, Ser: 2.17 mg/dL — ABNORMAL HIGH (ref 0.61–1.24)
GFR, Estimated: 32 mL/min — ABNORMAL LOW (ref >60)
Glucose, Bld: 105 mg/dL — ABNORMAL HIGH (ref 70–99)
Potassium: 5.4 mmol/L — ABNORMAL HIGH (ref 3.5–5.1)
Sodium: 136 mmol/L (ref 135–145)

## 2024-01-18 LAB — CBC
HCT: 36.3 % — ABNORMAL LOW (ref 39.0–52.0)
Hemoglobin: 11.2 g/dL — ABNORMAL LOW (ref 13.0–17.0)
MCH: 28.3 pg (ref 26.0–34.0)
MCHC: 30.9 g/dL (ref 30.0–36.0)
MCV: 91.7 fL (ref 80.0–100.0)
Platelets: 279 10*3/uL (ref 150–400)
RBC: 3.96 MIL/uL — ABNORMAL LOW (ref 4.22–5.81)
RDW: 15 % (ref 11.5–15.5)
WBC: 8.6 10*3/uL (ref 4.0–10.5)
nRBC: 0 % (ref 0.0–0.2)

## 2024-01-18 LAB — IRON AND TIBC
Iron: 57 ug/dL (ref 45–182)
Saturation Ratios: 12 % — ABNORMAL LOW (ref 17.9–39.5)
TIBC: 468 ug/dL — ABNORMAL HIGH (ref 250–450)
UIBC: 411 ug/dL

## 2024-01-18 LAB — FERRITIN: Ferritin: 18 ng/mL — ABNORMAL LOW (ref 24–336)

## 2024-01-18 NOTE — Progress Notes (Signed)
 Follow-up Visit   Date: 01/18/2024    Nicholas Foley MRN: [id number] DOB: 02-Dec-1950    Nicholas Foley is a 73 y.o. right-handed Caucasian male with  OSA on CPAP, hypertension, atrial fibrillation, CHF, COPD, and CKD returning to the clinic for follow-up of bilateral feet pain.  The patient was accompanied to the clinic by self.  IMPRESSION/PLAN: Bilateral feet paresthesias, possible small fiber neuropathy.  NCS/EMG of the legs was normal.  Neuropathy labs are normal. Pain is significantly improved with gabapentin 200mg  at bedtime, which will be continued.  If symptoms get worse, skin biopsy can be considered.   Return to clinic in 9 months  --------------------------------------------- History of present illness: Starting around 2022, he began having numbness in the balls of the feet and toes.  It now involves the midfoot and also has shooting pain associated with it.  No falls or imbalance. No low back or radicular pain.  He denies weakness.     Nonsmoker.  He rarely drinks alcohol.  No exposure to chemotherapy.  No family history of neuropathy.    In 2017, he was seen by me for bilateral hand numbness and found to have sensorimotor polyneuropathy with demyelinating and axonal changes.  He no longer has hand paresthesias.    UPDATE 01/18/2024:  He is here for follow-up visit.  Since starting gabapentin 200mg  at bedtime, his bilateral feet pain has significantly improved.  He rarely has sharp shooting pain.  He continues to have numbness.  Overall, he is very pleased with pain relief and is able to walk better, also.    Medications:  Current Outpatient Medications on File Prior to Visit  Medication Sig Dispense Refill   acetaminophen (TYLENOL) 650 MG CR tablet Take 1,300 mg by mouth in the morning and at bedtime. Arthritis strength     ADVAIR DISKUS 250-50 MCG/DOSE AEPB Inhale 1 puff into the lungs 2 (two) times daily.     carvedilol (COREG) 25 MG tablet Take 1 tablet (25 mg  total) by mouth 2 (two) times daily with a meal. (Patient taking differently: Take 25 mg by mouth daily.) 180 tablet 3   cholecalciferol (VITAMIN D3) 25 MCG (1000 UT) tablet Take 1,000 Units by mouth 2 times daily at 12 noon and 4 pm.     CIALIS 20 MG tablet Take 20 mg by mouth daily as needed for erectile dysfunction.   0   cyclobenzaprine (FLEXERIL) 10 MG tablet Take 10 mg by mouth 3 (three) times daily.     ELIQUIS 5 MG TABS tablet TAKE 1 TABLET(5 MG) BY MOUTH TWICE DAILY. NEED FOLLOW UP APPOINTMENT FOR MORE REFILL S 180 tablet 0   empagliflozin (JARDIANCE) 10 MG TABS tablet Take 1 tablet (10 mg total) by mouth daily before breakfast. 90 tablet 3   fluticasone (FLONASE) 50 MCG/ACT nasal spray Place 1 spray into both nostrils 2 (two) times daily.   0   gabapentin (NEURONTIN) 100 MG capsule Take 2 capsules (200 mg total) by mouth at bedtime. 180 capsule 3   Multiple Vitamins-Minerals (PRESERVISION AREDS PO) Take 1 capsule by mouth 2 (two) times daily.     omeprazole (PRILOSEC OTC) 20 MG tablet Take 20 mg by mouth daily.     potassium chloride SA (KLOR-CON M) 20 MEQ tablet TAKE 2 TABLETS(40 MEQ) BY MOUTH DAILY 180 tablet 1   sacubitril-valsartan (ENTRESTO) 49-51 MG Take 1 tablet by mouth 2 (two) times daily. 60 tablet 6   spironolactone (ALDACTONE) 25 MG tablet  TAKE 1 TABLET(25 MG) BY MOUTH DAILY 90 tablet 1   tamsulosin (FLOMAX) 0.4 MG CAPS capsule Take 0.4 mg by mouth daily.     vitamin B-12 (CYANOCOBALAMIN) 1000 MCG tablet Take 1,000 mcg by mouth daily.     zolpidem (AMBIEN CR) 12.5 MG CR tablet Take 12.5 mg by mouth at bedtime as needed for sleep.     furosemide (LASIX) 80 MG tablet Take 1 tablet (80 mg total) by mouth 2 (two) times daily. 360 tablet 3   No current facility-administered medications on file prior to visit.    Allergies:  Allergies  Allergen Reactions   Ace Inhibitors Cough   Angiotensin Receptor Blockers Cough    Vital Signs:  BP 114/69   Pulse 88   Ht 5\' 11"  (1.803  m)   Wt (!) 306 lb (138.8 kg)   SpO2 96%   BMI 42.68 kg/m   Neurological Exam: MENTAL STATUS including orientation to time, place, person, recent and remote memory, attention span and concentration, language, and fund of knowledge is normal.  Speech is not dysarthric.  CRANIAL NERVES:  Pupils equal round and reactive to light.  Normal conjugate, extra-ocular eye movements in all directions of gaze.  No ptosis.  Face is symmetric.  MOTOR:  Motor strength is 5/5 in all extremities.  No atrophy, fasciculations or abnormal movements.  No pronator drift.  Tone is normal.    MSRs:  Reflexes are 2+/4 throughout.  SENSORY:  Intact to vibration throughout.  COORDINATION/GAIT:   Gait narrow based and stable. Stressed and tandem gait intact.   Data: MRI cervical spine 07/15/2016: 1. At C4-5 there is a mild broad-based disc bulge. Mild bilateral foraminal narrowing. 2. At C5-6 there is a mild broad-based disc bulge. Left uncovertebral degenerative change resulting in moderate left foraminal narrowing. 3. At C6-7 there is a mild broad-based disc bulge. Left uncovertebral degenerative change. Mild left foraminal stenosis.   NCS/EMG of the legs 08/12/2016: This is a normal study of the lower extremities. In particular, there is no evidence of a sensorimotor polyneuropathy or lumbosacral radiculopathy.    NCS/EMG of the arms 07/22/2016: The electrophysiologic studies are most consistent with a subacute, symmetric sensorimotor polyneuropathy, demyelinating and axon loss in type, affecting the upper extremities. Overall, these findings are moderate in degree electrically.    NCS/EMG of the legs 09/09/2023: This is a normal study of the lower extremities.  In particular, there is no evidence of a large fiber sensorimotor polyneuropathy or lumbosacral radiculopathy.    Thank you for allowing me to participate in patient's care.  If I can answer any additional questions, I would be pleased to do so.     Sincerely,    Karlin Binion K. Allena Katz, DO

## 2024-01-18 NOTE — Patient Instructions (Addendum)
 Thank you for coming in today  If you had labs drawn today, any labs that are abnormal the clinic will call you No news is good news  You have been referred to Pharmacy for Zepbound , their office will call you for further appointment details    Medications: No changes  Follow up appointments:  Your physician recommends that you schedule a follow-up appointment in:  4 months With Dr. Gala Romney  You will receive a reminder letter in the mail a few months in advance. If you don't receive a letter, please call our office to schedule the follow-up appointment.    Do the following things EVERYDAY: Weigh yourself in the morning before breakfast. Write it down and keep it in a log. Take your medicines as prescribed Eat low salt foods--Limit salt (sodium) to 2000 mg per day.  Stay as active as you can everyday Limit all fluids for the day to less than 2 liters   At the [facility], you and your health needs are our priority. As part of our continuing mission to provide you with exceptional heart care, we have created designated Provider Care Teams. These Care Teams include your primary Cardiologist (physician) and Advanced Practice Providers (APPs- Physician Assistants and Nurse Practitioners) who all work together to provide you with the care you need, when you need it.   You may see any of the following providers on your designated Care Team at your next follow up: Dr Arvilla Meres Dr Marca Ancona Dr. Marcos Eke, NP Robbie Lis, [place] [facility] [place], [place] Brynda Peon, NP Karle Plumber, PharmD   Please be sure to bring in all your medications bottles to every appointment.    Thank you for choosing [facility]  If you have any questions or concerns before your next appointment please send [place] a message through [place] or call our office at [phone].    TO LEAVE A MESSAGE FOR THE  NURSE SELECT OPTION 2, PLEASE LEAVE A MESSAGE INCLUDING: YOUR NAME DATE OF BIRTH CALL BACK NUMBER REASON FOR CALL**this is important as we prioritize the call backs  YOU WILL RECEIVE A CALL BACK THE SAME DAY AS LONG AS YOU CALL BEFORE 4:00 PM

## 2024-01-18 NOTE — Addendum Note (Signed)
 Encounter addended by: Jacklynn Ganong, FNP on: 01/18/2024 2:51 PM  Actions taken: Clinical Note Signed

## 2024-01-18 NOTE — Addendum Note (Signed)
 Encounter addended by: Demetrius Charity, RN on: 01/18/2024 2:56 PM  Actions taken: Order list changed, Diagnosis association updated, Clinical Note Signed, Charge Capture section accepted

## 2024-01-18 NOTE — Progress Notes (Addendum)
 Advanced Heart Failure Clinic Note   Date:  01/18/2024   ID:  Nicholas Foley, DOB 05-31-1951, MRN [id number]  Location: Home  Provider location: [facility] Type of Visit: Established patient  PCP:  Nicholas Patience, FNP  Primary Cardiologist:  Nicholas Noe, MD (Inactive) Nephrology: Dr. Valentino Foley HF Cardiologist: Nicholas Foley  Chief Complaint: Heart Failure follow-up    HPI: Nicholas Foley is a 73 y.o. with morbid obesity, chronic systolic heart failure, COPD, OSA, PAF, previously on amio-> stopped 2018,  HTN, LBBB, Medtronic CRT-P, and former smoker.   He has h/o NICM which seems to date back to 2014. Had cardiac cath in 1/20 with no CAD and well compensated filling pressures.    Admitted in 1/20 for CRT-P device in setting of LBBB. Intially he felt a lot better but has gradually declined.   Echo 01/30/20 EF 40-45% (read as 35-40%)  Echo EF 2/22 45% (in some images 45-50%) Limited by septal paradox.   Got COVID again in 1/22 but was a mild case.   Had recurrent AF. Underwent DC-CV 8/22.   Echo 04/12/22 showed EF 50-55%, LV dyysynchrony, grade I DD,  RV ok.   Developed recurrent AF in early 9/23. Seen in AF clinic. Amio started. Had DC-CV attempt on 08/13/22 which failed x 3. Successful DCCV 11/23 to NSR. Acute visit 12/23, NYHA II-III and volume up. Instructed to take metolazone 2.5/40 KCL x 1.  TEE 12/30/22 showing EF 55%, moderate LAE, RV mildy HK, moderate pericardial effusion and no LAA clot. Then had successful PVI. Started on colchicine and PPI.  Had AF ablation in 2/24  Echo 10/24 EF 45-50%, RV ok, aortic root dilation 41 mm  Today he returns for HF follow up. Feels more SOB and fatigued x past year. Working out with trainer and says even trainer has noticed. SOB walking short distances on flat ground, continues to go to gym and lift weights. Had "long-haul COVID", wondering if that's contributing. Occasional dizziness when bending over. Denies  palpitations, abnormal bleeding, CP, edema, or PND/Orthopnea. Appetite ok. No fever or chills. Weight at home 295 pounds. Taking all medications, now off amiodarone. Wears BiPap religiously. BP at home "good."   Cardiac Studies - Echo (10/24): EF 45-50%, RV ok  - TEE (2/24): EF 55%, moderate LAE, RV mildy HK, moderate pericardial effusion and no LAA clot.  - Echo (5/23): EF 50-55%, severe LV dyysynchrony, grade I DD< RV ok,   - Echo (2/22): EF45%  - Echo (3/21): EF 40-45% (read as 35-40%).  - CPX (09/18/19):  Limited by obesity and mild heart failure Peak VO2 15.1  (80% predicted peak VO2) - when corrected to ibw pVO2 27.5 Slope 33 RER 1.02    - L/RHC (1/20) No coronary disease.  RA 13 PA 37/20 (27)  PCWP 14 CO 8.5 CI 3.3    - Echo (11/20): RV normal EF 45-50%   - Echo (11/19): EF 35-40% Grade II DD     Past Medical History:  Diagnosis Date   Atrial fibrillation with rapid ventricular response (HCC) 08/02/2013   CHF (congestive heart failure) (HCC)    Chronic systolic dysfunction of left ventricle 08/02/2013   BNP greater than 2000    COPD (chronic obstructive pulmonary disease) (HCC)    Moderate by PFTs with response to bronchodilators, May 2014   Erectile dysfunction    Hypertension    Insomnia 08/02/13   Left bundle branch block 08/02/2013  Morbid obesity (HCC) 08/02/2013   OSA (obstructive sleep apnea)    severe OSA with AHI 74/hr now on CPAP at 8cm H2O   Past Surgical History:  Procedure Laterality Date   ATRIAL FIBRILLATION ABLATION N/A 12/30/2022   Procedure: ATRIAL FIBRILLATION ABLATION;  Surgeon: Nicholas Prude, MD;  Location: [facility];  Service: Cardiovascular;  Laterality: N/A;   BIV PACEMAKER INSERTION CRT-P N/A 12/14/2018   Medtronic model [id number] Percepta Quad CRT-P MRI Surescan (serial Number [id number]) device implanted by Dr Nicholas Foley for CHF and LBBB   CARDIOVERSION N/A 08/06/2013   Procedure: CARDIOVERSION;  Surgeon: Nicholas Bunting, MD;   Location: [facility] ENDOSCOPY;  Service: Cardiovascular;  Laterality: N/A;  spoke with Nicholas Foley    CARDIOVERSION N/A 09/03/2013   Procedure: CARDIOVERSION;  Surgeon: Nicholas Noe, MD;  Location: [facility] ENDOSCOPY;  Service: Cardiovascular;  Laterality: N/A;   CARDIOVERSION N/A 07/21/2021   Procedure: CARDIOVERSION;  Surgeon: Nicholas Morale, MD;  Location: [facility] ENDOSCOPY;  Service: Cardiovascular;  Laterality: N/A;   CARDIOVERSION N/A 08/13/2022   Procedure: CARDIOVERSION;  Surgeon: Nicholas Stade, MD;  Location: [facility] ENDOSCOPY;  Service: Cardiovascular;  Laterality: N/A;   CARDIOVERSION N/A 09/03/2022   Procedure: CARDIOVERSION;  Surgeon: Nicholas Patty, MD;  Location: [facility] ENDOSCOPY;  Service: Cardiovascular;  Laterality: N/A;   CARDIOVERSION N/A 09/28/2022   Procedure: CARDIOVERSION;  Surgeon: Nicholas Lemming, MD;  Location: [facility]VAS[facility] CV LAB;  Service: Cardiovascular;  Laterality: N/A;   chronic systolic dysfu     NASAL SEPTUM SURGERY     RIGHT/LEFT HEART CATH AND CORONARY ANGIOGRAPHY N/A 12/04/2018   Procedure: RIGHT/LEFT HEART CATH AND CORONARY ANGIOGRAPHY;  Surgeon: Nicholas Records, MD;  Location: [facility]AS[facility] CV LAB;  Service: Cardiovascular;  Laterality: N/A;   TEE WITHOUT CARDIOVERSION N/A 08/06/2013   Procedure: TRANSESOPHAGEAL ECHOCARDIOGRAM (TEE);  Surgeon: Nicholas Bunting, MD;  Location: [facility] ENDOSCOPY;  Service: Cardiovascular;  Laterality: N/A;   TEE WITHOUT CARDIOVERSION N/A 12/30/2022   Procedure: TRANSESOPHAGEAL ECHOCARDIOGRAM (TEE);  Surgeon: Nicholas Patty, MD;  Location: [facility] INVASIVE CV LAB;  Service: Cardiovascular;  Laterality: N/A;   VASECTOMY     Current Outpatient Medications  Medication Sig Dispense Refill   acetaminophen (TYLENOL) 650 MG CR tablet Take 1,300 mg by mouth in the morning and at bedtime. Arthritis strength     ADVAIR DISKUS 250-50 MCG/DOSE AEPB Inhale 1 puff into the lungs 2 (two) times daily.     carvedilol (COREG) 25 MG tablet Take 1 tablet (25 mg total)  by mouth 2 (two) times daily with a meal. (Patient taking differently: Take 25 mg by mouth daily.) 180 tablet 3   cholecalciferol (VITAMIN D3) 25 MCG (1000 UT) tablet Take 1,000 Units by mouth 2 times daily at 12 noon and 4 pm.     CIALIS 20 MG tablet Take 20 mg by mouth daily as needed for erectile dysfunction.   0   cyclobenzaprine (FLEXERIL) 10 MG tablet Take 10 mg by mouth 3 (three) times daily.     ELIQUIS 5 MG TABS tablet TAKE 1 TABLET(5 MG) BY MOUTH TWICE DAILY. NEED FOLLOW UP APPOINTMENT FOR MORE REFILL S 180 tablet 0   empagliflozin (JARDIANCE) 10 MG TABS tablet Take 1 tablet (10 mg total) by mouth daily before breakfast. 90 tablet 3   fluticasone (FLONASE) 50 MCG/ACT nasal spray Place 1 spray into both nostrils 2 (two) times daily.   0   furosemide (LASIX) 80 MG tablet Take 1 tablet (80  mg total) by mouth 2 (two) times daily. 360 tablet 3   gabapentin (NEURONTIN) 100 MG capsule Take 2 capsules (200 mg total) by mouth at bedtime. 180 capsule 3   Multiple Vitamins-Minerals (PRESERVISION AREDS PO) Take 1 capsule by mouth 2 (two) times daily.     omeprazole (PRILOSEC OTC) 20 MG tablet Take 20 mg by mouth daily.     potassium chloride SA (KLOR-CON M) 20 MEQ tablet TAKE 2 TABLETS(40 MEQ) BY MOUTH DAILY 180 tablet 1   sacubitril-valsartan (ENTRESTO) 49-51 MG Take 1 tablet by mouth 2 (two) times daily. 60 tablet 6   spironolactone (ALDACTONE) 25 MG tablet TAKE 1 TABLET(25 MG) BY MOUTH DAILY 90 tablet 1   tamsulosin (FLOMAX) 0.4 MG CAPS capsule Take 0.4 mg by mouth daily.     vitamin B-12 (CYANOCOBALAMIN) 1000 MCG tablet Take 1,000 mcg by mouth daily.     zolpidem (AMBIEN CR) 12.5 MG CR tablet Take 12.5 mg by mouth at bedtime as needed for sleep.     No current facility-administered medications for this encounter.    Allergies:   Ace inhibitors and Angiotensin receptor blockers   Social History:  The patient  reports that he quit smoking about 11 years ago. His smoking use included cigarettes  and cigars. He started smoking about 54 years ago. He has a 64.5 pack-year smoking history. He has never used smokeless tobacco. He reports current alcohol use. He reports that he does not use drugs.   Family History:  The patient's family history includes Atrial fibrillation in his mother and sister; COPD in his father; Cerebral aneurysm in his brother; Healthy in his daughter; Heart failure in his father.   ROS:  Please see the history of present illness.   All other systems are personally reviewed and negative.   Recent Labs: 07/22/2023: ALT 18; Hemoglobin 13.2; Magnesium 2.3; Platelets 247; TSH 2.570 11/04/2023: B Natriuretic Peptide 27.6 11/18/2023: BUN 40; Creatinine, Ser 1.94; Potassium 4.7; Sodium 136  Personally reviewed   Wt Readings from Last 3 Encounters:  01/18/24 (!) 138.9 kg (306 lb 3.2 oz)  01/18/24 (!) 138.8 kg (306 lb)  10/26/23 (!) 136.5 kg (301 lb)    BP 138/76   Pulse 81   Wt (!) 138.9 kg (306 lb 3.2 oz)   SpO2 97%   BMI 42.71 kg/m   Physical Exam General:  NAD. No resp difficulty, walked into clinic HEENT: Normal Neck: Supple. No JVD. Thick neck Cor: Regular rate & rhythm. No rubs, gallops or murmurs. Lungs: Clear Abdomen: Soft, obese, nontender, nondistended.  Extremities: No cyanosis, clubbing, rash, edema Neuro: Alert & oriented x 3, moves all 4 extremities w/o difficulty. Affect pleasant.  ECG (personally reviewed): A sensed V paced, 78 bpm  Device interrogation (personally reviewed): 98% VP, 2.7 hr/day activity, no AT/AF, OptiVol and thoracic impedence stable.   ASSESSMENT AND PLAN: 1. Chronic Combined Systolic/Diastolic HF - NICM (thought due to LBBB). 12/2018 LHC no coronary disease.  - s/p Medtronic CRT-P - Echo (1/19) EF 35-40%. ECHO 11/19 EF 45-50%.  - Echo (11/20): EF read as 35-40% but with Definity I felt closed to 45% - Echo (3/21): EF read as 35-40% I think 40-45% - Echo EF (2/22): EF 45% (in some images 45-50%) Limited by septal  paradox. - CPX test (10/20) suboptimal effort mostly limited to obesity  - Echo (5/23) EF 50-55%, LV dyysynchrony, grade I DD, RV ok.  - TEE (2/24): EF 55%, moderate LAE, RV mildy HK - Echo (10/24):  EF 45-50%, RV ok - Stable NYHA II-early III. Suspect symptoms related to body habitus. Volume ok on exam and by OptiVol - Consider repeat CPX down the road - Continue Entresto 49/51 mg bid.  - Continue Lasix 80 mg bid. - Continue carvedilol 25 mg bid.  - Continue spiro 25 mg daily. - Continue Jadiance 10 mg daily. - ICD interrogate personally as above - Labs today.   2. PAF  - In the past he was on amiodarone. Stopped ~2017 - Underwent AF ablation 2/24  - Now off amio - No AF on device interrogation today. - Continue Eliquis   3. OSA - Wears BiPap religiously - No change   4. Morbid Obesity - Body mass index is 42.71 kg/m.  - Continue weight loss efforts.  - Insurance denied [employer]. Has new insurance now. Will re-refer (no dx of DM2 or CAD), ? If able to get Zepbound with OSA.  5. COPD  - No longer smoking. - No change. - Followed by Pulmonary  6. CKD 3b - Baseline SCr 1.7-2.0 - Followed by Dr. Valentino Foley - Continue SGLT2i. - Labs today.  7. Fatigue - Likely multifactorial - TSH ok - Check BMET, CBC and iron panel - Suspect obesity contributing, hopefully can get GLP1 - Can consider CPX down the road  Follow up in 4 months with Dr. Mickle Plumb, FNP  01/18/2024 1:59 PM  [facility] [address] [facility] [place] [address] [phone] (office) [phone] (fax)

## 2024-01-19 LAB — CUP PACEART REMOTE DEVICE CHECK
Battery Remaining Longevity: 41 mo
Battery Voltage: 2.94 V
Brady Statistic AP VP Percent: 0.08 %
Brady Statistic AP VS Percent: 0.01 %
Brady Statistic AS VP Percent: 98.21 %
Brady Statistic AS VS Percent: 1.69 %
Brady Statistic RA Percent Paced: 0.1 %
Brady Statistic RV Percent Paced: 84.34 %
Date Time Interrogation Session: 20250225202456
Implantable Lead Connection Status: 753985
Implantable Lead Connection Status: 753985
Implantable Lead Connection Status: 753985
Implantable Lead Implant Date: 20200123
Implantable Lead Implant Date: 20200123
Implantable Lead Implant Date: 20200123
Implantable Lead Location: 753858
Implantable Lead Location: 753859
Implantable Lead Location: 753860
Implantable Lead Model: 4598
Implantable Lead Model: 5076
Implantable Lead Model: 5076
Implantable Pulse Generator Implant Date: 20200123
Lead Channel Impedance Value: 1064 Ohm
Lead Channel Impedance Value: 1083 Ohm
Lead Channel Impedance Value: 1121 Ohm
Lead Channel Impedance Value: 1159 Ohm
Lead Channel Impedance Value: 1444 Ohm
Lead Channel Impedance Value: 1482 Ohm
Lead Channel Impedance Value: 361 Ohm
Lead Channel Impedance Value: 456 Ohm
Lead Channel Impedance Value: 456 Ohm
Lead Channel Impedance Value: 494 Ohm
Lead Channel Impedance Value: 532 Ohm
Lead Channel Impedance Value: 779 Ohm
Lead Channel Impedance Value: 817 Ohm
Lead Channel Impedance Value: 855 Ohm
Lead Channel Pacing Threshold Amplitude: 0.375 V
Lead Channel Pacing Threshold Amplitude: 0.75 V
Lead Channel Pacing Threshold Amplitude: 2.5 V
Lead Channel Pacing Threshold Pulse Width: 0.4 ms
Lead Channel Pacing Threshold Pulse Width: 0.4 ms
Lead Channel Pacing Threshold Pulse Width: 0.8 ms
Lead Channel Sensing Intrinsic Amplitude: 0.75 mV
Lead Channel Sensing Intrinsic Amplitude: 0.75 mV
Lead Channel Sensing Intrinsic Amplitude: 5.375 mV
Lead Channel Sensing Intrinsic Amplitude: 5.375 mV
Lead Channel Setting Pacing Amplitude: 1.5 V
Lead Channel Setting Pacing Amplitude: 2.5 V
Lead Channel Setting Pacing Amplitude: 3.5 V
Lead Channel Setting Pacing Pulse Width: 0.4 ms
Lead Channel Setting Pacing Pulse Width: 0.8 ms
Lead Channel Setting Sensing Sensitivity: 2 mV
Zone Setting Status: 755011
Zone Setting Status: 755011

## 2024-01-23 ENCOUNTER — Other Ambulatory Visit (HOSPITAL_COMMUNITY): Payer: Self-pay | Admitting: Internal Medicine

## 2024-01-24 ENCOUNTER — Encounter: Payer: Self-pay | Admitting: Cardiology

## 2024-01-26 ENCOUNTER — Encounter (HOSPITAL_COMMUNITY): Payer: Self-pay

## 2024-01-26 ENCOUNTER — Other Ambulatory Visit (HOSPITAL_COMMUNITY): Payer: Self-pay

## 2024-01-26 ENCOUNTER — Ambulatory Visit (HOSPITAL_COMMUNITY)
Admission: RE | Admit: 2024-01-26 | Discharge: 2024-01-26 | Disposition: A | Discharge status: Home / Self Care | Source: Ambulatory Visit | Attending: Internal Medicine | Admitting: Internal Medicine

## 2024-01-26 DIAGNOSIS — R5383 Other fatigue: Secondary | ICD-10-CM | POA: Diagnosis present

## 2024-01-26 DIAGNOSIS — Z950 Presence of cardiac pacemaker: Secondary | ICD-10-CM | POA: Diagnosis present

## 2024-01-26 DIAGNOSIS — I5022 Chronic systolic (congestive) heart failure: Secondary | ICD-10-CM | POA: Diagnosis present

## 2024-01-26 DIAGNOSIS — I5042 Chronic combined systolic (congestive) and diastolic (congestive) heart failure: Secondary | ICD-10-CM | POA: Diagnosis present

## 2024-01-26 LAB — BASIC METABOLIC PANEL
Anion gap: 10 (ref 5–15)
BUN: 38 mg/dL — ABNORMAL HIGH (ref 8–23)
CO2: 23 mmol/L (ref 22–32)
Calcium: 8.8 mg/dL — ABNORMAL LOW (ref 8.9–10.3)
Chloride: 105 mmol/L (ref 98–111)
Creatinine, Ser: 1.67 mg/dL — ABNORMAL HIGH (ref 0.61–1.24)
GFR, Estimated: 44 mL/min — ABNORMAL LOW (ref >60)
Glucose, Bld: 110 mg/dL — ABNORMAL HIGH (ref 70–99)
Potassium: 4.4 mmol/L (ref 3.5–5.1)
Sodium: 138 mmol/L (ref 135–145)

## 2024-01-26 MED ORDER — SODIUM CHLORIDE 0.9 % IV SOLN
510.0000 mg | Freq: Once | INTRAVENOUS | Status: AC
  Administered 2024-01-26: 510 mg via INTRAVENOUS
  Filled 2024-01-26: qty 510

## 2024-01-30 ENCOUNTER — Other Ambulatory Visit: Payer: Self-pay

## 2024-01-30 ENCOUNTER — Ambulatory Visit (INDEPENDENT_AMBULATORY_CARE_PROVIDER_SITE_OTHER): Payer: Medicare Other

## 2024-01-30 DIAGNOSIS — Z950 Presence of cardiac pacemaker: Secondary | ICD-10-CM | POA: Diagnosis not present

## 2024-01-30 DIAGNOSIS — I5022 Chronic systolic (congestive) heart failure: Secondary | ICD-10-CM

## 2024-01-30 MED ORDER — APIXABAN 5 MG PO TABS: 5.0000 mg | ORAL_TABLET | Freq: Two times a day (BID) | ORAL | 1 refills | Status: DC

## 2024-01-30 NOTE — Progress Notes (Signed)
 EPIC Encounter for ICM Monitoring  Patient Name: Nicholas Foley is a 73 y.o. male Date: 01/30/2024 Primary Care Physican: Camie Patience, FNP Primary Cardiologist: Smith/Bensimhon Electrophysiologist: Lalla Brothers Nephrologist: [facility]: Dr Valentino Nose Bi-V Pacing:  98.0% 01/03/2023 Weight: 299 lbs 01/05/2023 Weight: 297 lbs 02/09/2023 Weight: 296 lbs 03/15/2023 Weight: 292 lbs 08/02/2023 Weight: 291 lbs 10/26/2023 Office Weight: 301 lbs 01/30/2024 Weight: 295 lbs    Since 18-Jan-2024 Time in AT/AF   <0.1 hr/day (<0.1%)        Spoke with patient and heart failure questions reviewed.  Transmission results reviewed.  Pt asymptomatic for fluid accumulation.   Pt has been having gout episodes and was told to increase fluids.   He's had iron infusions.           Optivol thoracic impedance suggesting possible fluid accumulation starting 2/26.     Prescribed: Furosemide 80 mg take 1 tablet(s) (80 mg total) twice a day.   Potassium 20 mEq take 2 tablet(s) daily Spironolactone 25 mg take 1 tablet daily   Labs: 01/26/2024 Creatinine 1.67, BUN 38, Potassium 4.4, Sodium 138, GFR 44 01/18/2024 Creatinine 2.17, BUN 42, Potassium 5.4, Sodium 136, GFR 32 A complete set of results can be found in Results Review.   Recommendations:  No changes and encouraged to call if experiencing any fluid symptoms.   Follow-up plan: ICM clinic phone appointment on 02/06/2024 to recheck fluid levels.   91 day device clinic remote transmission 04/18/2024.     EP/Cardiology Office Visits:   Recall 04/23/2024 with Dr Lalla Brothers or APP.   Recall 05/17/2024 with Dr Gala Romney.     Copy of ICM check sent to Dr Lalla Brothers.    3 month ICM trend: 01/30/2024.    12-14 Month ICM trend:     Karie Soda, RN 01/30/2024 3:51 PM

## 2024-01-30 NOTE — Telephone Encounter (Signed)
 Prescription refill request for Eliquis received. Indication:afib Last office visit:12/24 Scr:1.67  2/25 Age: 73 Weight:133.8  kg  Prescription refilled

## 2024-01-30 NOTE — Telephone Encounter (Signed)
@  Shanda Bumps Would you like to repeat BMET to follow potassium?

## 2024-01-31 ENCOUNTER — Encounter: Payer: Self-pay | Admitting: Cardiology

## 2024-02-03 ENCOUNTER — Other Ambulatory Visit (HOSPITAL_COMMUNITY)

## 2024-02-06 ENCOUNTER — Other Ambulatory Visit (HOSPITAL_COMMUNITY): Payer: Self-pay

## 2024-02-06 ENCOUNTER — Ambulatory Visit (INDEPENDENT_AMBULATORY_CARE_PROVIDER_SITE_OTHER)

## 2024-02-06 DIAGNOSIS — I5022 Chronic systolic (congestive) heart failure: Secondary | ICD-10-CM

## 2024-02-06 DIAGNOSIS — Z950 Presence of cardiac pacemaker: Secondary | ICD-10-CM

## 2024-02-06 MED ORDER — EMPAGLIFLOZIN 10 MG PO TABS: 10.0000 mg | ORAL_TABLET | Freq: Every day | ORAL | 3 refills | Status: AC

## 2024-02-06 MED ORDER — POTASSIUM CHLORIDE CRYS ER 20 MEQ PO TBCR: EXTENDED_RELEASE_TABLET | ORAL | 1 refills | Status: DC

## 2024-02-07 ENCOUNTER — Telehealth: Payer: Self-pay

## 2024-02-07 NOTE — Progress Notes (Signed)
 EPIC Encounter for ICM Monitoring  Patient Name: Nicholas Foley is a 73 y.o. male Date: 02/07/2024 Primary Care Physican: Camie Patience, FNP Primary Cardiologist: Smith/Bensimhon Electrophysiologist: Lalla Brothers Nephrologist:  [facility]: Dr Valentino Nose Bi-V Pacing:  96.5% 01/03/2023 Weight: 299 lbs 01/05/2023 Weight: 297 lbs 02/09/2023 Weight: 296 lbs 03/15/2023 Weight: 292 lbs 08/02/2023 Weight: 291 lbs 10/26/2023 Office Weight: 301 lbs 01/30/2024 Weight: 295 lbs    Since 18-Jan-2024 Time in AT/AF   <0.1 hr/day (<0.1%)         Attempted call to patient and unable to reach.  Left detailed message per DPR regarding transmission.  Transmission results reviewed.          Optivol thoracic impedance suggesting fluid levels returned to normal.     Prescribed: Furosemide 80 mg take 1 tablet(s) (80 mg total) twice a day.   Potassium 20 mEq take 2 tablet(s) daily Spironolactone 25 mg take 1 tablet daily   Labs: 01/26/2024 Creatinine 1.67, BUN 38, Potassium 4.4, Sodium 138, GFR 44 01/18/2024 Creatinine 2.17, BUN 42, Potassium 5.4, Sodium 136, GFR 32 A complete set of results can be found in Results Review.   Recommendations:  Left voice mail with ICM number and encouraged to call if experiencing any fluid symptoms.   Follow-up plan: ICM clinic phone appointment on 03/05/2024.   91 day device clinic remote transmission 04/18/2024.     EP/Cardiology Office Visits:   Recall 04/23/2024 with Dr Lalla Brothers or APP.   Recall 05/17/2024 with Dr Gala Romney.     Copy of ICM check sent to Dr Lalla Brothers.    3 month ICM trend: 02/05/2024.    12-14 Month ICM trend:     Karie Soda, RN 02/07/2024 3:25 PM

## 2024-02-07 NOTE — Telephone Encounter (Signed)
 Remote ICM transmission received.  Attempted call to patient regarding ICM remote transmission and left detailed message per DPR.  Left ICM phone number and advised to return call for any fluid symptoms or questions. Next ICM remote transmission scheduled 03/05/2024.

## 2024-02-08 ENCOUNTER — Other Ambulatory Visit (HOSPITAL_COMMUNITY)

## 2024-02-15 ENCOUNTER — Ambulatory Visit (HOSPITAL_COMMUNITY)
Admission: RE | Admit: 2024-02-15 | Discharge: 2024-02-15 | Disposition: A | Discharge status: Home / Self Care | Source: Ambulatory Visit | Attending: Cardiology | Admitting: Cardiology

## 2024-02-15 DIAGNOSIS — I428 Other cardiomyopathies: Secondary | ICD-10-CM | POA: Insufficient documentation

## 2024-02-15 LAB — BASIC METABOLIC PANEL
Anion gap: 11 (ref 5–15)
BUN: 37 mg/dL — ABNORMAL HIGH (ref 8–23)
CO2: 25 mmol/L (ref 22–32)
Calcium: 8.7 mg/dL — ABNORMAL LOW (ref 8.9–10.3)
Chloride: 102 mmol/L (ref 98–111)
Creatinine, Ser: 1.87 mg/dL — ABNORMAL HIGH (ref 0.61–1.24)
GFR, Estimated: 38 mL/min — ABNORMAL LOW (ref >60)
Glucose, Bld: 102 mg/dL — ABNORMAL HIGH (ref 70–99)
Potassium: 4.3 mmol/L (ref 3.5–5.1)
Sodium: 138 mmol/L (ref 135–145)

## 2024-02-22 ENCOUNTER — Ambulatory Visit (INDEPENDENT_AMBULATORY_CARE_PROVIDER_SITE_OTHER): Admitting: Podiatrist

## 2024-02-22 ENCOUNTER — Encounter: Payer: Self-pay | Admitting: Podiatrist

## 2024-02-22 ENCOUNTER — Ambulatory Visit (INDEPENDENT_AMBULATORY_CARE_PROVIDER_SITE_OTHER)

## 2024-02-22 DIAGNOSIS — M109 Gout, unspecified: Secondary | ICD-10-CM | POA: Diagnosis not present

## 2024-02-22 DIAGNOSIS — M79672 Pain in left foot: Secondary | ICD-10-CM

## 2024-02-22 DIAGNOSIS — M79671 Pain in right foot: Secondary | ICD-10-CM

## 2024-02-22 MED ORDER — TRIAMCINOLONE ACETONIDE 10 MG/ML IJ SUSP
10.0000 mg | Freq: Once | INTRAMUSCULAR | Status: AC
  Administered 2024-02-22: 10 mg via INTRA_ARTICULAR

## 2024-02-22 NOTE — Progress Notes (Signed)
 Remote pacemaker transmission.

## 2024-02-22 NOTE — Addendum Note (Signed)
 Addended by: Elease Etienne A on: 02/22/2024 09:04 AM   Modules accepted: Orders

## 2024-02-22 NOTE — Patient Instructions (Addendum)
Gout  Gout is a condition that causes painful swelling of the joints. Gout is a type of inflammation of the joints (arthritis). This condition is caused by having too much uric acid in the body. Uric acid is a chemical that forms when the body breaks down substances called purines. Purines are important for building body proteins. When the body has too much uric acid, sharp crystals can form and build up inside the joints. This causes pain and swelling. Gout attacks can happen quickly and may be very painful (acute gout). Over time, the attacks can affect more joints and become more frequent (chronic gout). Gout can also cause uric acid to build up under the skin and inside the kidneys. What are the causes? This condition is caused by too much uric acid in your blood. This can happen because: Your kidneys do not remove enough uric acid from your blood. This is the most common cause. Your body makes too much uric acid. This can happen with some cancers and cancer treatments. It can also occur if your body is breaking down too many red blood cells (hemolytic anemia). You eat too many foods that are high in purines. These foods include organ meats and some seafood. Alcohol, especially beer, is also high in purines. A gout attack may be triggered by trauma or stress. What increases the risk? The following factors may make you more likely to develop this condition: Having a family history of gout. Being male and middle-aged. Being male and having gone through menopause. Taking certain medicines, including aspirin, cyclosporine, diuretics, levodopa, and niacin. Having an organ transplant. Having certain conditions, such as: Being obese. Lead poisoning. Kidney disease. A skin condition called psoriasis. Other factors include: Losing weight too quickly. Being dehydrated. Frequently drinking alcohol, especially beer. Frequently drinking beverages that are sweetened with a type of sugar called  fructose. What are the signs or symptoms? An attack of acute gout happens quickly. It usually occurs in just one joint. The most common place is the big toe. Attacks often start at night. Other joints that may be affected include joints of the feet, ankle, knee, fingers, wrist, or elbow. Symptoms of this condition may include: Severe pain. Warmth. Swelling. Stiffness. Tenderness. The affected joint may be very painful to touch. Shiny, red, or purple skin. Chills and fever. Chronic gout may cause symptoms more frequently. More joints may be involved. You may also have white or yellow lumps (tophi) on your hands or feet or in other areas near your joints. How is this diagnosed? This condition is diagnosed based on your symptoms, your medical history, and a physical exam. You may have tests, such as: Blood tests to measure uric acid levels. Removal of joint fluid with a thin needle (aspiration) to look for uric acid crystals. X-rays to look for joint damage. How is this treated? Treatment for this condition has two phases: treating an acute attack and preventing future attacks. Acute gout treatment may include medicines to reduce pain and swelling, including: NSAIDs, such as ibuprofen. Steroids. These are strong anti-inflammatory medicines that can be taken by mouth (orally) or injected into a joint. Colchicine. This medicine relieves pain and swelling when it is taken soon after an attack. It can be given by mouth or through an IV. Preventive treatment may include: Daily use of smaller doses of NSAIDs or colchicine. Use of a medicine that reduces uric acid levels in your blood, such as allopurinol. Changes to your diet. You may need to see   a dietitian about what to eat and drink to prevent gout. Follow these instructions at home: During a gout attack  If directed, put ice on the affected area. To do this: Put ice in a plastic bag. Place a towel between your skin and the bag. Leave the  ice on for 20 minutes, 2-3 times a day. Remove the ice if your skin turns bright red. This is very important. If you cannot feel pain, heat, or cold, you have a greater risk of damage to the area. Raise (elevate) the affected joint above the level of your heart as often as possible. Rest the joint as much as possible. If the affected joint is in your leg, you may be given crutches to use. Follow instructions from your health care provider about eating or drinking restrictions. Avoiding future gout attacks Follow a low-purine diet as told by your dietitian or health care provider. Avoid foods and drinks that are high in purines, including liver, kidney, anchovies, asparagus, herring, mushrooms, mussels, and beer. Maintain a healthy weight or lose weight if you are overweight. If you want to lose weight, talk with your health care provider. Do not lose weight too quickly. Start or maintain an exercise program as told by your health care provider. Eating and drinking Avoid drinking beverages that contain fructose. Drink enough fluids to keep your urine pale yellow. If you drink alcohol: Limit how much you have to: 0-1 drink a day for women who are not pregnant. 0-2 drinks a day for men. Know how much alcohol is in a drink. In the [place] one drink equals one 12 oz bottle of beer (355 mL), one 5 oz glass of wine (148 mL), or one 1 oz glass of hard liquor (44 mL). General instructions Take over-the-counter and prescription medicines only as told by your health care provider. Ask your health care provider if the medicine prescribed to you requires you to avoid driving or using machinery. Return to your normal activities as told by your health care provider. Ask your health care provider what activities are safe for you. Keep all follow-up visits. This is important. Where to find more information National Institutes of Health: www.niams.nih.gov Contact a health care provider if you have: Another  gout attack. Continuing symptoms of a gout attack after 10 days of treatment. Side effects from your medicines. Chills or a fever. Burning pain when you urinate. Pain in your lower back or abdomen. Get help right away if you: Have severe or uncontrolled pain. Cannot urinate. Summary Gout is painful swelling of the joints caused by having too much uric acid in the body. The most common site for gout to occur is in the big toe, but it can affect other joints in the body. Medicines and dietary changes can help to prevent and treat gout attacks. This information is not intended to replace advice given to you by your health care provider. Make sure you discuss any questions you have with your health care provider. Document Revised: 08/12/2021 Document Reviewed: 08/12/2021 Elsevier Patient Education  2024 [employer]  

## 2024-02-22 NOTE — Progress Notes (Signed)
 Chief Complaint  Patient presents with   Gout    RM#2 Bilateral foot pain patient not responding to treatment has had gout flare up for the last 8 weeks.Patient is currently taking gout medication but not on medication list and he doesn't know the name.     HPI: Patient is 73 y.o. male who presents today for pain in bilateral feet likely due to gout.  He has been treated with allopurinol and 2 rounds of oral steroids by his primary care provider Chari Manning, FNP.  He states the pain has moved from his ankles to his feet and has been bothering him for about 8 weeks.  He relates his feet would improve after taking the oral steroids but have failed to resolve completely.    Patient Active Problem List   Diagnosis Date Noted   Chronic systolic heart failure (HCC) 01/18/2024   Persistent atrial fibrillation (HCC) 08/09/2022   Hypercoagulable state due to persistent atrial fibrillation (HCC) 08/09/2022   Former smoker 06/30/2021   Pulmonary nodules 06/30/2021   Cardiac resynchronization therapy pacemaker (CRT-P) in place 03/13/2019   Nonischemic cardiomyopathy (HCC) 12/14/2018   On amiodarone therapy 09/09/2017   Polyneuropathy, peripheral sensorimotor axonal 08/19/2016   OSA (obstructive sleep apnea)    Chronic anticoagulation 10/12/2013    Class: Chronic   COPD (chronic obstructive pulmonary disease) (HCC) 08/02/2013    Class: Chronic   Paroxysmal A-fib (HCC) 08/02/2013    Class: Acute   Chronic combined systolic and diastolic heart failure (HCC) 08/02/2013    Class: Chronic   Hypertension, essential 08/02/2013    Class: Chronic   Morbid obesity (HCC) 08/02/2013    Class: Chronic   Snoring 08/02/2013    Class: Chronic   Left bundle branch block 08/02/2013    Class: Acute    Current Outpatient Medications on File Prior to Visit  Medication Sig Dispense Refill   acetaminophen (TYLENOL) 650 MG CR tablet Take 1,300 mg by mouth in the morning and at bedtime. Arthritis strength      ADVAIR DISKUS 250-50 MCG/DOSE AEPB Inhale 1 puff into the lungs 2 (two) times daily.     apixaban (ELIQUIS) 5 MG TABS tablet Take 1 tablet (5 mg total) by mouth 2 (two) times daily. 180 tablet 1   carvedilol (COREG) 25 MG tablet Take 1 tablet (25 mg total) by mouth 2 (two) times daily with a meal. (Patient taking differently: Take 25 mg by mouth daily.) 180 tablet 3   cholecalciferol (VITAMIN D3) 25 MCG (1000 UT) tablet Take 1,000 Units by mouth 2 times daily at 12 noon and 4 pm.     CIALIS 20 MG tablet Take 20 mg by mouth daily as needed for erectile dysfunction.   0   cyclobenzaprine (FLEXERIL) 10 MG tablet Take 10 mg by mouth 3 (three) times daily.     empagliflozin (JARDIANCE) 10 MG TABS tablet Take 1 tablet (10 mg total) by mouth daily before breakfast. 90 tablet 3   fluticasone (FLONASE) 50 MCG/ACT nasal spray Place 1 spray into both nostrils 2 (two) times daily.   0   furosemide (LASIX) 80 MG tablet Take 1 tablet (80 mg total) by mouth 2 (two) times daily. 360 tablet 3   gabapentin (NEURONTIN) 100 MG capsule Take 2 capsules (200 mg total) by mouth at bedtime. 180 capsule 3   Multiple Vitamins-Minerals (PRESERVISION AREDS PO) Take 1 capsule by mouth 2 (two) times daily.     omeprazole (PRILOSEC OTC) 20 MG tablet Take 20  mg by mouth daily.     potassium chloride SA (KLOR-CON M) 20 MEQ tablet Patient takes 2 tablets by mouth daily. 180 tablet 1   sacubitril-valsartan (ENTRESTO) 49-51 MG Take 1 tablet by mouth 2 (two) times daily. 60 tablet 6   spironolactone (ALDACTONE) 25 MG tablet TAKE 1 TABLET(25 MG) BY MOUTH DAILY 90 tablet 1   tamsulosin (FLOMAX) 0.4 MG CAPS capsule Take 0.4 mg by mouth daily.     vitamin B-12 (CYANOCOBALAMIN) 1000 MCG tablet Take 1,000 mcg by mouth daily.     zolpidem (AMBIEN CR) 12.5 MG CR tablet Take 12.5 mg by mouth at bedtime as needed for sleep.     No current facility-administered medications on file prior to visit.    Allergies  Allergen Reactions   Ace  Inhibitors Cough   Angiotensin Receptor Blockers Cough    Review of Systems No fevers, chills, nausea, muscle aches, no difficulty breathing, no calf pain, no chest pain or shortness of breath.   Physical Exam  GENERAL APPEARANCE: Alert, conversant. Appropriately groomed. No acute distress.   VASCULAR: Pedal pulses palpable 2/4 DP and 2/4 PT bilateral.  Capillary refill time is immediate to all digits,  Proximal to distal cooling is warm to warm.  Digital perfusion adequate.   NEUROLOGIC: sensation is intact to 5.07 monofilament at 5/5 sites bilateral.  Light touch is intact bilateral, vibratory sensation intact bilateral-  general paresthesia to toes present- non painful.   MUSCULOSKELETAL: acceptable muscle strength, tone and stability bilateral. Warmth noted to the midfoot area of bilateral feet.  Some warmth in the medial ankle right is also noted. No appreciable redness is noted.    DERMATOLOGIC: skin is warm, supple, and dry.  Color, texture, and turgor of skin within normal limits.  No open wounds are noted.  No preulcerative lesions are seen.  Digital nails are asymptomatic.    Radiographic exam- right foot:  Decreased joint space first metatarsal phalangeal joint-   No fracture or dislocation or acute osseous abnormalities present.  Joint spaces are normal. No sign of gouty tophi noted.   Radiographic exam:  Normal osseous mineralization.  No fracture or dislocation or acute osseous abnormalities present.  Joint spaces are normal. No sign of gouty tophi noted.    Assessment     ICD-10-CM   1. Bilateral foot pain  M79.671 DG Foot Complete Right   M79.672 DG Foot Complete Left    2. Acute gout of left foot, unspecified cause  M10.9     3. Acute gout of right foot, unspecified cause  M10.9        Plan  Discussed exam and treatment recommendations.  I recommended an injection of steroid into the area of discomfort -  [place] was in agreement-  I prepped the skin with  alcohol and infiltrated 20mg  of kenalog and 0.5% marcaine plain into the right foot in the midfoot and the sinus tarsi;  and another injection of 20mg  kenalog and 0.5% marcaine was infiltrated into the left foot at the 2/3 metatarsal base and 4/5 metatarsal base.    He tolerated this well.  Bandaids were applied and he was given information about gout.   He will return in 1 week for follow up.  If symptoms are resolved he may call and cancel this appointment.  Recommended he continue taking the allopurinol.

## 2024-02-29 ENCOUNTER — Encounter: Payer: Self-pay | Admitting: Podiatrist

## 2024-02-29 ENCOUNTER — Ambulatory Visit (INDEPENDENT_AMBULATORY_CARE_PROVIDER_SITE_OTHER): Admitting: Podiatrist

## 2024-02-29 VITALS — Ht 71.0 in | Wt 295.0 lb

## 2024-02-29 DIAGNOSIS — M109 Gout, unspecified: Secondary | ICD-10-CM | POA: Diagnosis not present

## 2024-02-29 MED ORDER — TRIAMCINOLONE ACETONIDE 10 MG/ML IJ SUSP
10.0000 mg | Freq: Once | INTRAMUSCULAR | Status: AC
  Administered 2024-02-29: 10 mg via INTRA_ARTICULAR

## 2024-02-29 NOTE — Progress Notes (Signed)
 Chief Complaint  Patient presents with   Foot Pain    "My feet are doing better and I still have some soreness in my right foot along my big toe at times"     HPI: Patient is 73 y.o. male who presents today for follow-up from an acute gout attack he was having he relates his symptoms are improved and he was able to walk on the treadmill.  He still has a couple spots that are painful.   Allergies  Allergen Reactions   Ace Inhibitors Cough   Angiotensin Receptor Blockers Cough    Review of systems is negative except as noted in the HPI.  Denies nausea/ vomiting/ fevers/ chills or night sweats.   Denies difficulty breathing, denies calf pain or tenderness  Physical Exam  Neuro vascular r status intact and unchanged from his last visit.  He continues to have some pain between the second and third metatarsal of the left foot and the medial aspect of the right first metatarsal due to a gout attack..    Assessment:   ICD-10-CM   1. Acute gout of left foot, unspecified cause  M10.9     2. Acute gout of right foot, unspecified cause  M10.9        Plan: Discussed treatment options and recommendations.  Agreed to another cortisone injection bilateral feet to help alleviate the pain he is experiencing from the gout attack.  An injection of 10 mg of Kenalog with Marcaine plain was infiltrated into the right and left foot without complication at today's visit.  Also discussed should he continue to have issues with gout I would recommend a rheumatology referral.  He will see how he does with the injections and let me know if he would like this referral.

## 2024-03-05 ENCOUNTER — Ambulatory Visit: Attending: Cardiology

## 2024-03-05 DIAGNOSIS — Z950 Presence of cardiac pacemaker: Secondary | ICD-10-CM

## 2024-03-05 DIAGNOSIS — I5022 Chronic systolic (congestive) heart failure: Secondary | ICD-10-CM | POA: Diagnosis not present

## 2024-03-07 ENCOUNTER — Telehealth: Payer: Self-pay

## 2024-03-07 NOTE — Telephone Encounter (Signed)
 Opened in Error, duplicate.

## 2024-03-07 NOTE — Telephone Encounter (Signed)
 Remote ICM transmission received.  Attempted call to patient regarding ICM remote transmission and left detailed message per DPR.  Left ICM phone number and advised to return call for any fluid symptoms or questions. Next ICM remote transmission scheduled 04/09/2024.

## 2024-03-07 NOTE — Progress Notes (Signed)
 EPIC Encounter for ICM Monitoring  Patient Name: Nicholas Foley is a 73 y.o. male Date: 03/07/2024 Primary Care Physican: [name], FNP Primary Cardiologist: [facility] Electrophysiologist: Marven Slimmer Nephrologist: [facility]: Dr Cindra Cree Bi-V Pacing:  96.0% 01/03/2023 Weight: 299 lbs 01/05/2023 Weight: 297 lbs 02/09/2023 Weight: 296 lbs 03/15/2023 Weight: 292 lbs 08/02/2023 Weight: 291 lbs 10/26/2023 Office Weight: 301 lbs 01/30/2024 Weight: 295 lbs    Since 05-Feb-2024 Monitored VT (>4 beats)  1 Time in AT/AF   0.4 hr/day (1.7%)  Taking Eliquis         Attempted call to patient and unable to reach.  Left detailed message per DPR regarding transmission.  Transmission results reviewed.          Optivol thoracic impedance suggesting normal fluid levels since 4/6.  Suggested possible fluid accumulation from 3/14-3/24 and 3/26-4/5.   Prescribed: Furosemide 80 mg take 1 tablet(s) (80 mg total) twice a day.   Potassium 20 mEq take 2 tablet(s) daily Spironolactone 25 mg take 1 tablet daily   Labs: 01/26/2024 Creatinine 1.67, BUN 38, Potassium 4.4, Sodium 138, GFR 44 01/18/2024 Creatinine 2.17, BUN 42, Potassium 5.4, Sodium 136, GFR 32 A complete set of results can be found in Results Review.   Recommendations:  Left voice mail with ICM number and encouraged to call if experiencing any fluid symptoms.   Follow-up plan: ICM clinic phone appointment on 04/09/2024.   91 day device clinic remote transmission 04/18/2024.     EP/Cardiology Office Visits:   Recall 04/23/2024 with Dr Marven Slimmer or APP.   Recall 05/17/2024 with Dr Julane Ny.     Copy of ICM check sent to Dr Marven Slimmer.    3 month ICM trend: 03/05/2024.    12-14 Month ICM trend:     Almyra Jain, RN 03/07/2024 3:41 PM

## 2024-03-08 ENCOUNTER — Ambulatory Visit
Admission: RE | Admit: 2024-03-08 | Discharge: 2024-03-08 | Disposition: A | Discharge status: Home / Self Care | Source: Ambulatory Visit | Attending: Acute Care | Admitting: Acute Care

## 2024-03-08 DIAGNOSIS — Z87891 Personal history of nicotine dependence: Secondary | ICD-10-CM

## 2024-03-08 DIAGNOSIS — Z122 Encounter for screening for malignant neoplasm of respiratory organs: Secondary | ICD-10-CM

## 2024-03-13 NOTE — Progress Notes (Deleted)
 Patient ID: Nicholas Foley                 DOB: [birth year]                    MRN: [id number]     HPI: Nicholas Foley is a 73 y.o. male patient referred to pharmacy clinic by *** to initiate GLP1-RA therapy. PMH is significant for ***, and obesity. Most recent BMI *** kg/m .  Baseline weight and BMI: *** kg/m  Current weight and BMI: *** kg/m  Current meds that affect weight: ****  *** If diabetic and on insulin/sulfonylurea, can consider reducing dose to reduce risk of hypoglycemia  *** Follow-up visit  Assess % weight loss Assess adverse effects Missed doses  Diet:   Exercise:   Family History:   Social History:   Labs: Lab Results  Component Value Date   HGBA1C 5.4 08/27/2020    Wt Readings from Last 1 Encounters:  02/29/24 295 lb (133.8 kg)    BP Readings from Last 1 Encounters:  01/26/24 113/65   Pulse Readings from Last 1 Encounters:  01/26/24 (!) 58       Component Value Date/Time   CHOL 184 08/03/2013 0640   TRIG 149 08/03/2013 0640   HDL 50 08/03/2013 0640   CHOLHDL 3.7 08/03/2013 0640   VLDL 30 08/03/2013 0640   LDLCALC 104 (H) 08/03/2013 0640    Past Medical History:  Diagnosis Date   Atrial fibrillation with rapid ventricular response (HCC) 08/02/2013   CHF (congestive heart failure) (HCC)    Chronic systolic dysfunction of left ventricle 08/02/2013   BNP greater than 2000    COPD (chronic obstructive pulmonary disease) (HCC)    Moderate by PFTs with response to bronchodilators, May 2014   Erectile dysfunction    Hypertension    Insomnia 08/02/13   Left bundle branch block 08/02/2013   Morbid obesity (HCC) 08/02/2013   OSA (obstructive sleep apnea)    severe OSA with AHI 74/hr now on CPAP at 8cm H2O    Current Outpatient Medications on File Prior to Visit  Medication Sig Dispense Refill   acetaminophen  (TYLENOL ) 650 MG CR tablet Take 1,300 mg by mouth in the morning and at bedtime. Arthritis strength     ADVAIR DISKUS 250-50 MCG/DOSE  AEPB Inhale 1 puff into the lungs 2 (two) times daily.     apixaban  (ELIQUIS ) 5 MG TABS tablet Take 1 tablet (5 mg total) by mouth 2 (two) times daily. 180 tablet 1   carvedilol  (COREG ) 25 MG tablet Take 1 tablet (25 mg total) by mouth 2 (two) times daily with a meal. (Patient taking differently: Take 25 mg by mouth daily.) 180 tablet 3   cholecalciferol (VITAMIN D3) 25 MCG (1000 UT) tablet Take 1,000 Units by mouth 2 times daily at 12 noon and 4 pm.     CIALIS 20 MG tablet Take 20 mg by mouth daily as needed for erectile dysfunction.   0   cyclobenzaprine (FLEXERIL) 10 MG tablet Take 10 mg by mouth 3 (three) times daily.     empagliflozin  (JARDIANCE ) 10 MG TABS tablet Take 1 tablet (10 mg total) by mouth daily before breakfast. 90 tablet 3   fluticasone  (FLONASE ) 50 MCG/ACT nasal spray Place 1 spray into both nostrils 2 (two) times daily.   0   furosemide  (LASIX ) 80 MG tablet Take 1 tablet (80 mg total) by mouth 2 (two) times daily. 360 tablet 3  gabapentin  (NEURONTIN ) 100 MG capsule Take 2 capsules (200 mg total) by mouth at bedtime. 180 capsule 3   Multiple Vitamins-Minerals (PRESERVISION AREDS PO) Take 1 capsule by mouth 2 (two) times daily.     omeprazole (PRILOSEC OTC) 20 MG tablet Take 20 mg by mouth daily.     potassium chloride  SA (KLOR-CON  M) 20 MEQ tablet Patient takes 2 tablets by mouth daily. 180 tablet 1   sacubitril -valsartan  (ENTRESTO ) 49-51 MG Take 1 tablet by mouth 2 (two) times daily. 60 tablet 6   spironolactone  (ALDACTONE ) 25 MG tablet TAKE 1 TABLET(25 MG) BY MOUTH DAILY 90 tablet 1   tamsulosin (FLOMAX) 0.4 MG CAPS capsule Take 0.4 mg by mouth daily.     vitamin B-12 (CYANOCOBALAMIN ) 1000 MCG tablet Take 1,000 mcg by mouth daily.     zolpidem  (AMBIEN  CR) 12.5 MG CR tablet Take 12.5 mg by mouth at bedtime as needed for sleep.     No current facility-administered medications on file prior to visit.    Allergies  Allergen Reactions   Ace Inhibitors Cough   Angiotensin  Receptor Blockers Cough     Assessment/Plan:  1. Weight loss - Patient has not met goal of at least 5% of body weight loss with comprehensive lifestyle modifications alone in the past 3-6 months. Pharmacotherapy is appropriate to pursue as augmentation. Will start ***. Confirmed patient not ***pregnant and no personal or family history of medullary thyroid  carcinoma (MTC) or Multiple Endocrine Neoplasia syndrome type 2 (MEN 2). Injection technique reviewed at today's visit.  Advised patient on common side effects including nausea, diarrhea, dyspepsia, decreased appetite, and fatigue. Counseled patient on reducing meal size and how to titrate medication to minimize side effects. Counseled patient to call if intolerable side effects or if experiencing dehydration, abdominal pain, or dizziness. Patient will adhere to dietary modifications and will target at least 150 minutes of moderate intensity exercise weekly.   Follow up in 1 month via telephone for tolerability update and dose titration.

## 2024-03-14 ENCOUNTER — Telehealth: Payer: Self-pay | Admitting: Pharmacist

## 2024-03-14 ENCOUNTER — Ambulatory Visit: Admitting: Pharmacist

## 2024-03-14 ENCOUNTER — Telehealth: Payer: Self-pay | Admitting: Pharmacy Technician

## 2024-03-14 ENCOUNTER — Other Ambulatory Visit (HOSPITAL_COMMUNITY): Payer: Self-pay

## 2024-03-14 NOTE — Telephone Encounter (Signed)
 Pharmacy Patient Advocate Encounter  Received notification from [employer] that Prior Authorization for zepbound has been APPROVED from 11/23/23 to 11/21/24. Ran test claim, Copay is $0.00- one month. This test claim was processed through [facility]- copay amounts may vary at other pharmacies due to pharmacy/plan contracts, or as the patient moves through the different stages of their insurance plan.   PA #/Case ID/Reference #: [id number]

## 2024-03-14 NOTE — Telephone Encounter (Signed)
 Pharmacy Patient Advocate Encounter   Received notification from Pt Calls Messages that prior authorization for zepbound is required/requested.   Insurance verification completed.   The patient is insured through [employer] .   Per test claim: PA required; PA submitted to above mentioned insurance via CoverMyMeds Key/confirmation #/EOC [id number] Status is pending

## 2024-03-14 NOTE — Telephone Encounter (Signed)
 PA approved see other encounter for more info

## 2024-03-22 ENCOUNTER — Encounter (HOSPITAL_COMMUNITY): Payer: Self-pay

## 2024-03-22 NOTE — Telephone Encounter (Signed)
 PT came in this morning asking about his meds that was approved he said that it's been 12 days and it's still not there can you please give him a call.

## 2024-03-22 NOTE — Telephone Encounter (Signed)
 Returned call to patient Pt is checking the status of medication that was approved with insurance (zepbound )  Educated this medication will likely be driven by [facility] PharmD, unsure of dose titrations/medication education and again will likely come from Pharm D  -will send message so script can be sent to correct pharmacy. Pt requests a returned call to cell #

## 2024-03-23 ENCOUNTER — Other Ambulatory Visit (HOSPITAL_COMMUNITY): Payer: Self-pay

## 2024-03-23 ENCOUNTER — Encounter: Payer: Self-pay | Admitting: Pharmacist

## 2024-03-23 MED ORDER — ZEPBOUND 2.5 MG/0.5ML ~~LOC~~ SOAJ
2.5000 mg | SUBCUTANEOUS | 0 refills | Status: DC
  Filled 2024-03-23: qty 2, 28d supply, fill #0

## 2024-03-26 ENCOUNTER — Telehealth: Payer: Self-pay | Admitting: Pharmacist

## 2024-03-26 ENCOUNTER — Telehealth: Payer: Self-pay

## 2024-03-26 NOTE — Telephone Encounter (Signed)
 Patient ended up canceling apt with me on 03/14/24 for GLP1 consultation. Patient had rescheduled on 06/16. PA for Zepbound  had been approved and we accidentally sent prescription so trying to reach to patient either hold on to Zepbound  initiation till 06/16 or we can move appointment to early date in May. N/A, LVM.

## 2024-03-26 NOTE — Telephone Encounter (Signed)
 Copied from CRM [id number]. Topic: Clinical - Lab/Test Results >> Mar 23, 2024  9:58 AM Nicholas Foley wrote: Reason for CRM: Patient had CT on 4/17 and would like a call to discuss results.  Sending Mychart message. Scan has not been read.

## 2024-03-26 NOTE — Progress Notes (Unsigned)
 Patient ID: Nicholas Foley                 DOB: 02/17/1951                    MRN: [id number]     HPI: Nicholas Foley is a 73 y.o. male patient referred to pharmacy clinic by Vernia Good, FNP to initiate GLP1-RA therapy. PMH is significant for NICM, PAF,CHF,COPD,OSA,polyneuropathy , and obesity. Most recent BMI 41.16 kg/m .  Baseline weight and BMI: 306 lbs 42.7  kg/m  Current weight and BMI: 295 lbs 41.16 kg/m  Current meds that affect weight: none  Lost 40 lbs and goal weight 240 or 250 lbs   Diet:  Doesn't eat fast food or processed food  Eats more protein an fruits and vegetables  Drink: water, 1 -12 oz pepsi and 1- 12 oz Gatorade Snack: not much, fruits  Breakfast: protein bar  Lunch: sandwiches with deli meat or left over supper  Dinner: rice, salads, likes blue cheese dressing chicken and noodles    Social history:  Alcohol: 5- 6 beers on Friday Smoking: quit 15 years ago   Exercise: Tues and Thursday - go to trainer  Work involves lot of walking.    Family History:  Relation Problem Comments  Mother (Deceased) Atrial fibrillation     Father (Deceased) COPD   Heart failure     Sister Metallurgist)   Sister Metallurgist) Atrial fibrillation     Brother (Deceased) Cerebral aneurysm       Labs: Lab Results  Component Value Date   HGBA1C 5.4 08/27/2020    Wt Readings from Last 1 Encounters:  02/29/24 295 lb (133.8 kg)    BP Readings from Last 1 Encounters:  01/26/24 113/65   Pulse Readings from Last 1 Encounters:  01/26/24 (!) 58       Component Value Date/Time   CHOL 184 08/03/2013 0640   TRIG 149 08/03/2013 0640   HDL 50 08/03/2013 0640   CHOLHDL 3.7 08/03/2013 0640   VLDL 30 08/03/2013 0640   LDLCALC 104 (H) 08/03/2013 0640    Past Medical History:  Diagnosis Date   Atrial fibrillation with rapid ventricular response (HCC) 08/02/2013   CHF (congestive heart failure) (HCC)    Chronic systolic dysfunction of left ventricle 08/02/2013   BNP  greater than 2000    COPD (chronic obstructive pulmonary disease) (HCC)    Moderate by PFTs with response to bronchodilators, May 2014   Erectile dysfunction    Hypertension    Insomnia 08/02/13   Left bundle branch block 08/02/2013   Morbid obesity (HCC) 08/02/2013   OSA (obstructive sleep apnea)    severe OSA with AHI 74/hr now on CPAP at 8cm H2O    Current Outpatient Medications on File Prior to Visit  Medication Sig Dispense Refill   acetaminophen  (TYLENOL ) 650 MG CR tablet Take 1,300 mg by mouth in the morning and at bedtime. Arthritis strength     ADVAIR DISKUS 250-50 MCG/DOSE AEPB Inhale 1 puff into the lungs 2 (two) times daily.     apixaban  (ELIQUIS ) 5 MG TABS tablet Take 1 tablet (5 mg total) by mouth 2 (two) times daily. 180 tablet 1   carvedilol  (COREG ) 25 MG tablet Take 1 tablet (25 mg total) by mouth 2 (two) times daily with a meal. (Patient taking differently: Take 25 mg by mouth daily.) 180 tablet 3   cholecalciferol (VITAMIN D3) 25 MCG (1000 UT) tablet Take 1,000  Units by mouth 2 times daily at 12 noon and 4 pm.     CIALIS 20 MG tablet Take 20 mg by mouth daily as needed for erectile dysfunction.   0   cyclobenzaprine (FLEXERIL) 10 MG tablet Take 10 mg by mouth 3 (three) times daily.     empagliflozin  (JARDIANCE ) 10 MG TABS tablet Take 1 tablet (10 mg total) by mouth daily before breakfast. 90 tablet 3   fluticasone  (FLONASE ) 50 MCG/ACT nasal spray Place 1 spray into both nostrils 2 (two) times daily.   0   furosemide  (LASIX ) 80 MG tablet Take 1 tablet (80 mg total) by mouth 2 (two) times daily. 360 tablet 3   gabapentin  (NEURONTIN ) 100 MG capsule Take 2 capsules (200 mg total) by mouth at bedtime. 180 capsule 3   Multiple Vitamins-Minerals (PRESERVISION AREDS PO) Take 1 capsule by mouth 2 (two) times daily.     omeprazole (PRILOSEC OTC) 20 MG tablet Take 20 mg by mouth daily.     potassium chloride  SA (KLOR-CON  M) 20 MEQ tablet Patient takes 2 tablets by mouth daily. 180  tablet 1   sacubitril -valsartan  (ENTRESTO ) 49-51 MG Take 1 tablet by mouth 2 (two) times daily. 60 tablet 6   spironolactone  (ALDACTONE ) 25 MG tablet TAKE 1 TABLET(25 MG) BY MOUTH DAILY 90 tablet 1   tamsulosin (FLOMAX) 0.4 MG CAPS capsule Take 0.4 mg by mouth daily.     tirzepatide  (ZEPBOUND ) 2.5 MG/0.5ML Pen Inject 2.5 mg into the skin once a week. 2 mL 0   vitamin B-12 (CYANOCOBALAMIN ) 1000 MCG tablet Take 1,000 mcg by mouth daily.     zolpidem  (AMBIEN  CR) 12.5 MG CR tablet Take 12.5 mg by mouth at bedtime as needed for sleep.     No current facility-administered medications on file prior to visit.    Allergies  Allergen Reactions   Ace Inhibitors Cough   Angiotensin Receptor Blockers Cough     Assessment/Plan:  1. Weight loss - Patient has not met goal of at least 5% of body weight loss with comprehensive lifestyle modifications alone in the past 3-6 months. Pharmacotherapy is appropriate to pursue as augmentation. Will start Zepbound  2.5 mg once a week . Confirmed patient has no personal or family history of medullary thyroid  carcinoma (MTC) or Multiple Endocrine Neoplasia syndrome type 2 (MEN 2). Injection technique reviewed at today's visit.  Advised patient on common side effects including nausea, diarrhea, dyspepsia, decreased appetite, and fatigue. Counseled patient on reducing meal size and how to titrate medication to minimize side effects. Counseled patient to call if intolerable side effects or if experiencing dehydration, abdominal pain, or dizziness. Patient will adhere to dietary modifications and will target at least 150 minutes of moderate intensity exercise weekly.   Follow up in 1 month via telephone for tolerability update and dose titration.  Nickola Baron, Pharm.D [place] [facility] [address], [place], [place] [address] Phone: [phone]; Fax: [phone]

## 2024-03-27 ENCOUNTER — Encounter: Payer: Self-pay | Admitting: Pharmacist

## 2024-03-27 ENCOUNTER — Ambulatory Visit: Attending: Cardiology | Admitting: Pharmacist

## 2024-03-27 DIAGNOSIS — G4733 Obstructive sleep apnea (adult) (pediatric): Secondary | ICD-10-CM | POA: Diagnosis present

## 2024-03-27 NOTE — Patient Instructions (Signed)
 GLP1 Agonist Titration Plan:  Will plan to follow the titration plan as below, pending patient is tolerating each dose before increasing to the next. Can slow titration if needed for tolerability.   Zepbound    Treatment week Recommended Zepbound  (tirzepatide ) dosage for weight loss and sleep apnea  Weeks 1-4  2.5 mg once a week  Weeks 5-8    5 mg once a week   Week 9 and beyond For weight loss: Can raise dosage again, if needed, by no more than 2.5 mg at once. Dosage increases should be separated by at least 4 weeks.  For sleep apnea: Raise dosage to 7.5 mg once a week. At week 13, raise it again to 10 mg once a week. Starting at week 17, your dose can be raised again, if needed, by no more than 2.5 mg at once. Dosage increases should be separated by at least 4 weeks.   Maximum dosage 15 mg once a week  Recommended maintenance dosage for weight loss  5 mg, 10 mg, or 15 mg once a week  Recommended maintenance dosage for sleep apnea 10 mg or 15 mg once a week

## 2024-04-09 ENCOUNTER — Telehealth: Payer: Self-pay | Admitting: Acute Care

## 2024-04-09 ENCOUNTER — Ambulatory Visit: Attending: Cardiology

## 2024-04-09 ENCOUNTER — Telehealth: Payer: Self-pay | Admitting: Pharmacist

## 2024-04-09 ENCOUNTER — Other Ambulatory Visit (HOSPITAL_COMMUNITY): Payer: Self-pay

## 2024-04-09 DIAGNOSIS — Z950 Presence of cardiac pacemaker: Secondary | ICD-10-CM

## 2024-04-09 DIAGNOSIS — I5022 Chronic systolic (congestive) heart failure: Secondary | ICD-10-CM | POA: Diagnosis not present

## 2024-04-09 DIAGNOSIS — R918 Other nonspecific abnormal finding of lung field: Secondary | ICD-10-CM

## 2024-04-09 DIAGNOSIS — Z87891 Personal history of nicotine dependence: Secondary | ICD-10-CM

## 2024-04-09 MED ORDER — ZEPBOUND 5 MG/0.5ML ~~LOC~~ SOAJ
5.0000 mg | SUBCUTANEOUS | 0 refills | Status: DC
  Filled 2024-04-09: qty 2, 28d supply, fill #0

## 2024-04-09 NOTE — Progress Notes (Signed)
 EPIC Encounter for ICM Monitoring  Patient Name: Nicholas Foley is a 73 y.o. male Date: 04/09/2024 Primary Care Physican: [name], FNP Primary Cardiologist: [facility] Electrophysiologist: Marven Slimmer Nephrologist:  [facility]: Dr Cindra Cree Bi-V Pacing:  96.0% 01/03/2023 Weight: 299 lbs 01/05/2023 Weight: 297 lbs 02/09/2023 Weight: 296 lbs 03/15/2023 Weight: 292 lbs 08/02/2023 Weight: 291 lbs 10/26/2023 Office Weight: 301 lbs 01/30/2024 Weight: 295 lbs 04/09/2024 Weight: 302 lbs    Since 05-Mar-2024 Time in AT/AF  <0.1 hr/day (<0.1%)  Taking Eliquis          Spoke with patient and heart failure questions reviewed.  Transmission results reviewed.  Pt reports stomach bloating the last couple of days due to eating foods high in salt.  He has started Zepbound  in the last couple of weeks and his appetite is already decreasing.             Optivol thoracic impedance suggesting possible fluid accumulation starting 5/15.   Prescribed: Furosemide  80 mg take 1 tablet(s) (80 mg total) twice a day.   Potassium 20 mEq take 2 tablet(s) daily Spironolactone  25 mg take 1 tablet daily   Labs: 02/15/2024 Creatinine 1.87, BUN 37, Potassium 4.3, Sodium 138, GFR 38 01/26/2024 Creatinine 1.67, BUN 38, Potassium 4.4, Sodium 138, GFR 44 01/18/2024 Creatinine 2.17, BUN 42, Potassium 5.4, Sodium 136, GFR 32 A complete set of results can be found in Results Review.   Recommendations:  He will limit salt intake and call back if fluid does not resolve.   Follow-up plan: ICM clinic phone appointment on 04/18/2024 to recheck fluid levels.   91 day device clinic remote transmission 04/18/2024.     EP/Cardiology Office Visits:   Recall 04/23/2024 with Dr Marven Slimmer or APP.   Recall 05/17/2024 with Dr Julane Ny.     Copy of ICM check sent to Dr Marven Slimmer.    3 month ICM trend: 04/08/2024.    12-14 Month ICM trend:     Almyra Jain, RN 04/09/2024 4:15 PM

## 2024-04-09 NOTE — Telephone Encounter (Signed)
 Spoke with patient and reviewed CT results. 2 new small nodules noted. We will plan to repeat CT in 6 months. Pt verbalized understanding. Results/ plans faxed to PCP. Order placed for 6 month nodule f/u CT.

## 2024-04-09 NOTE — Telephone Encounter (Signed)
 Called and left VM for pt to review results.    IMPRESSION: 1. Lung-RADS 3, probably benign findings. Short-term follow-up in 6 months is recommended with repeat low-dose chest CT without contrast (please use the following order, CT CHEST LCS NODULE FOLLOW-UP W/O CM). 2. Two new solid pulmonary nodules within the right middle lobe and right lower lobe measuring 5.5 mm and 5.5 mm respectively. Remaining pulmonary nodules are stable or resolved. 3. Mild coronary artery calcification.   Aortic Atherosclerosis (ICD10-I70.0).     Electronically Signed   By: Worthy Heads M.D.   On: 04/06/2024 20:20

## 2024-04-09 NOTE — Addendum Note (Signed)
 Addended by: Pearl Bottcher D on: 04/09/2024 02:06 PM   Modules accepted: Orders

## 2024-04-09 NOTE — Telephone Encounter (Signed)
 Patient called, states he tolerates Zepbound  2.5 mg dose well. Ready to move on to 5 mg once a week. Updated prescription sent to the pharmacy. F/u die on 4 weeks for frther dose titration

## 2024-04-18 ENCOUNTER — Ambulatory Visit: Payer: 59

## 2024-04-18 ENCOUNTER — Ambulatory Visit (INDEPENDENT_AMBULATORY_CARE_PROVIDER_SITE_OTHER)

## 2024-04-18 DIAGNOSIS — I5022 Chronic systolic (congestive) heart failure: Secondary | ICD-10-CM

## 2024-04-18 DIAGNOSIS — Z950 Presence of cardiac pacemaker: Secondary | ICD-10-CM

## 2024-04-18 DIAGNOSIS — I428 Other cardiomyopathies: Secondary | ICD-10-CM | POA: Diagnosis not present

## 2024-04-18 NOTE — Progress Notes (Unsigned)
 EPIC Encounter for ICM Monitoring  Patient Name: Nicholas Foley is a 73 y.o. male Date: 04/18/2024 Primary Care Physican: [name], FNP Primary Cardiologist: [facility] Electrophysiologist: Marven Slimmer Nephrologist: [facility]: Dr Cindra Cree Bi-V Pacing:  92.9% 01/03/2023 Weight: 299 lbs 01/05/2023 Weight: 297 lbs 02/09/2023 Weight: 296 lbs 03/15/2023 Weight: 292 lbs 08/02/2023 Weight: 291 lbs 10/26/2023 Office Weight: 301 lbs 01/30/2024 Weight: 295 lbs 04/09/2024 Weight: 302 lbs    Clinical Status (08-Apr-2024 to 17-Apr-2024) Monitored VT (>4 beats)   3 Time in AT/AF  <0.1 hr/day (<0.1%)  Taking Eliquis          Attempted call to patient and unable to reach.  Left detailed message per DPR regarding transmission.  Transmission results reviewed.          Optivol thoracic impedance suggesting fluid levels returned to normal 5/26.   Prescribed: Furosemide  80 mg take 1 tablet(s) (80 mg total) twice a day.   Potassium 20 mEq take 2 tablet(s) daily Spironolactone  25 mg take 1 tablet daily   Labs: 02/15/2024 Creatinine 1.87, BUN 37, Potassium 4.3, Sodium 138, GFR 38 01/26/2024 Creatinine 1.67, BUN 38, Potassium 4.4, Sodium 138, GFR 44 01/18/2024 Creatinine 2.17, BUN 42, Potassium 5.4, Sodium 136, GFR 32 A complete set of results can be found in Results Review.   Recommendations:  Left voice mail with ICM number and encouraged to call if experiencing any fluid symptoms.   Follow-up plan: ICM clinic phone appointment on 06/04/2024.   91 day device clinic remote transmission 07/18/2024.     EP/Cardiology Office Visits:   Recall 04/23/2024 with Dr Marven Slimmer or APP.   Recall 05/17/2024 with Dr Julane Ny.     Copy of ICM check sent to Dr Marven Slimmer.    3 month ICM trend: 04/18/2024.    12-14 Month ICM trend:     Almyra Jain, RN 04/18/2024 7:58 AM

## 2024-04-19 ENCOUNTER — Telehealth: Payer: Self-pay

## 2024-04-19 LAB — CUP PACEART REMOTE DEVICE CHECK
Battery Remaining Longevity: 35 mo
Battery Voltage: 2.93 V
Brady Statistic AP VP Percent: 0.16 %
Brady Statistic AP VS Percent: 0.02 %
Brady Statistic AS VP Percent: 92.79 %
Brady Statistic AS VS Percent: 7.03 %
Brady Statistic RA Percent Paced: 0.2 %
Brady Statistic RV Percent Paced: 40.82 %
Date Time Interrogation Session: 20250527222242
Implantable Lead Connection Status: 753985
Implantable Lead Connection Status: 753985
Implantable Lead Connection Status: 753985
Implantable Lead Implant Date: 20200123
Implantable Lead Implant Date: 20200123
Implantable Lead Implant Date: 20200123
Implantable Lead Location: 753858
Implantable Lead Location: 753859
Implantable Lead Location: 753860
Implantable Lead Model: 4598
Implantable Lead Model: 5076
Implantable Lead Model: 5076
Implantable Pulse Generator Implant Date: 20200123
Lead Channel Impedance Value: 1007 Ohm
Lead Channel Impedance Value: 1064 Ohm
Lead Channel Impedance Value: 1121 Ohm
Lead Channel Impedance Value: 1311 Ohm
Lead Channel Impedance Value: 1425 Ohm
Lead Channel Impedance Value: 361 Ohm
Lead Channel Impedance Value: 475 Ohm
Lead Channel Impedance Value: 475 Ohm
Lead Channel Impedance Value: 513 Ohm
Lead Channel Impedance Value: 513 Ohm
Lead Channel Impedance Value: 665 Ohm
Lead Channel Impedance Value: 779 Ohm
Lead Channel Impedance Value: 836 Ohm
Lead Channel Impedance Value: 950 Ohm
Lead Channel Pacing Threshold Amplitude: 0.375 V
Lead Channel Pacing Threshold Amplitude: 0.75 V
Lead Channel Pacing Threshold Amplitude: 3 V
Lead Channel Pacing Threshold Pulse Width: 0.4 ms
Lead Channel Pacing Threshold Pulse Width: 0.4 ms
Lead Channel Pacing Threshold Pulse Width: 0.8 ms
Lead Channel Sensing Intrinsic Amplitude: 0.75 mV
Lead Channel Sensing Intrinsic Amplitude: 0.75 mV
Lead Channel Sensing Intrinsic Amplitude: 6.25 mV
Lead Channel Sensing Intrinsic Amplitude: 6.25 mV
Lead Channel Setting Pacing Amplitude: 1.5 V
Lead Channel Setting Pacing Amplitude: 2.5 V
Lead Channel Setting Pacing Amplitude: 4 V
Lead Channel Setting Pacing Pulse Width: 0.4 ms
Lead Channel Setting Pacing Pulse Width: 0.8 ms
Lead Channel Setting Sensing Sensitivity: 2 mV
Zone Setting Status: 755011
Zone Setting Status: 755011

## 2024-04-19 NOTE — Telephone Encounter (Signed)
Remote ICM transmission received.  Attempted call to patient regarding ICM remote transmission and left detailed message per DPR.  Left ICM phone number and advised to return call for any fluid symptoms or questions.

## 2024-04-23 ENCOUNTER — Other Ambulatory Visit: Payer: Self-pay | Admitting: Cardiology

## 2024-04-23 ENCOUNTER — Other Ambulatory Visit: Payer: Self-pay | Admitting: Family Medicine

## 2024-04-23 ENCOUNTER — Ambulatory Visit: Payer: Self-pay | Admitting: Cardiology

## 2024-04-25 ENCOUNTER — Other Ambulatory Visit (HOSPITAL_COMMUNITY): Payer: Self-pay | Admitting: *Deleted

## 2024-04-25 ENCOUNTER — Telehealth: Payer: Self-pay

## 2024-04-25 NOTE — Telephone Encounter (Signed)
 Auth Submission: NO AUTH NEEDED Site of care: Site of care: [facility] Payer: Medicare A/B with BCBS supplement Medication & CPT/J Code(s) submitted: Feraheme (ferumoxytol ) [id number] Route of submission (phone, fax, portal):  Phone # Fax # Auth type: Buy/Bill PB Units/visits requested: 510mg  x 2 doses Reference number:  Approval from: 04/23/24 to 08/23/24

## 2024-04-26 ENCOUNTER — Other Ambulatory Visit (HOSPITAL_COMMUNITY): Payer: Self-pay | Admitting: Internal Medicine

## 2024-04-26 ENCOUNTER — Ambulatory Visit (HOSPITAL_COMMUNITY)
Admission: RE | Admit: 2024-04-26 | Discharge: 2024-04-26 | Disposition: A | Discharge status: Home / Self Care | Source: Ambulatory Visit | Attending: Cardiology | Admitting: Cardiology

## 2024-04-26 ENCOUNTER — Other Ambulatory Visit (HOSPITAL_COMMUNITY): Payer: Self-pay | Admitting: Family Medicine

## 2024-04-26 DIAGNOSIS — E611 Iron deficiency: Secondary | ICD-10-CM | POA: Diagnosis present

## 2024-04-26 MED ORDER — SODIUM CHLORIDE 0.9 % IV SOLN
510.0000 mg | INTRAVENOUS | Status: DC
  Administered 2024-04-26: 510 mg via INTRAVENOUS
  Filled 2024-04-26: qty 510

## 2024-05-03 ENCOUNTER — Encounter (HOSPITAL_COMMUNITY)
Admission: RE | Admit: 2024-05-03 | Discharge: 2024-05-03 | Disposition: A | Discharge status: Home / Self Care | Source: Ambulatory Visit | Attending: Internal Medicine | Admitting: Internal Medicine

## 2024-05-03 DIAGNOSIS — E611 Iron deficiency: Secondary | ICD-10-CM | POA: Diagnosis present

## 2024-05-03 MED ORDER — SODIUM CHLORIDE 0.9 % IV SOLN
510.0000 mg | INTRAVENOUS | Status: DC
  Administered 2024-05-03: 510 mg via INTRAVENOUS
  Filled 2024-05-03: qty 510

## 2024-05-07 ENCOUNTER — Ambulatory Visit: Admitting: Pharmacist

## 2024-05-09 ENCOUNTER — Other Ambulatory Visit (HOSPITAL_COMMUNITY): Payer: Self-pay

## 2024-05-09 ENCOUNTER — Other Ambulatory Visit: Payer: Self-pay | Admitting: Cardiology

## 2024-05-11 ENCOUNTER — Encounter (HOSPITAL_COMMUNITY): Payer: Self-pay

## 2024-05-11 ENCOUNTER — Other Ambulatory Visit (HOSPITAL_COMMUNITY): Payer: Self-pay

## 2024-05-11 MED ORDER — ZEPBOUND 7.5 MG/0.5ML ~~LOC~~ SOAJ
7.5000 mg | SUBCUTANEOUS | 0 refills | Status: DC
  Filled 2024-05-11: qty 2, 28d supply, fill #0

## 2024-05-14 ENCOUNTER — Other Ambulatory Visit (HOSPITAL_COMMUNITY): Payer: Self-pay

## 2024-06-04 ENCOUNTER — Ambulatory Visit: Attending: Cardiology

## 2024-06-04 DIAGNOSIS — I5022 Chronic systolic (congestive) heart failure: Secondary | ICD-10-CM | POA: Diagnosis not present

## 2024-06-04 DIAGNOSIS — Z950 Presence of cardiac pacemaker: Secondary | ICD-10-CM | POA: Diagnosis not present

## 2024-06-08 ENCOUNTER — Other Ambulatory Visit (HOSPITAL_COMMUNITY): Payer: Self-pay

## 2024-06-08 ENCOUNTER — Other Ambulatory Visit: Payer: Self-pay | Admitting: Cardiology

## 2024-06-08 DIAGNOSIS — G4733 Obstructive sleep apnea (adult) (pediatric): Secondary | ICD-10-CM

## 2024-06-08 NOTE — Progress Notes (Signed)
 EPIC Encounter for ICM Monitoring  Patient Name: Nicholas Foley is a 73 y.o. male Date: 06/08/2024 Primary Care Physican: Dyane Anthony RAMAN, FNP Primary Cardiologist: [facility] Electrophysiologist: Cindie Nephrologist: [facility]: Dr Macel Pore Pacing:  92.2% 10/26/2023 Office Weight: 301 lbs 01/30/2024 Weight: 295 lbs 04/09/2024 Weight: 302 lbs 06/08/2024 Weight: 290 lbs    Since 17-Apr-2024 Time in AT/AF  <0.1 hr/day (<0.1%)  Taking Eliquis          Spoke with patient and heart failure questions reviewed.  Transmission results reviewed.  Pt asymptomatic for fluid accumulation.  Reports feeling well at this time and voices no complaints.           Optivol thoracic impedance suggesting intermittent days with possible fluid accumulation within the last month.   Prescribed: Furosemide  80 mg take 1 tablet(s) (80 mg total) twice a day.   Potassium 20 mEq take 2 tablet(s) daily Spironolactone  25 mg take 1 tablet daily   Labs: 02/15/2024 Creatinine 1.87, BUN 37, Potassium 4.3, Sodium 138, GFR 38 01/26/2024 Creatinine 1.67, BUN 38, Potassium 4.4, Sodium 138, GFR 44 01/18/2024 Creatinine 2.17, BUN 42, Potassium 5.4, Sodium 136, GFR 32 A complete set of results can be found in Results Review.   Recommendations:  No changes and encouraged to call if experiencing any fluid symptoms.   Follow-up plan: ICM clinic phone appointment on 07/09/2024.   91 day device clinic remote transmission 07/18/2024.     EP/Cardiology Office Visits:  Advised to call the offices of Dr Cherrie and Dr Cindie for appts.  Recall 04/23/2024 with Dr Cindie or APP.   Recall 05/17/2024 with Dr Cherrie.     Copy of ICM check sent to Dr Cindie.    3 month ICM trend: 06/04/2024.    12-14 Month ICM trend:     [name], RN 06/08/2024 1:25 PM

## 2024-06-08 NOTE — Progress Notes (Signed)
 Remote pacemaker transmission.

## 2024-06-08 NOTE — Addendum Note (Signed)
 Addended by: VICCI SELLER A on: 06/08/2024 04:06 PM   Modules accepted: Orders

## 2024-06-11 ENCOUNTER — Other Ambulatory Visit (HOSPITAL_COMMUNITY): Payer: Self-pay

## 2024-06-11 MED ORDER — ZEPBOUND 10 MG/0.5ML ~~LOC~~ SOAJ
10.0000 mg | SUBCUTANEOUS | 0 refills | Status: DC
  Filled 2024-06-11: qty 2, 28d supply, fill #0

## 2024-06-12 ENCOUNTER — Other Ambulatory Visit (HOSPITAL_COMMUNITY): Payer: Self-pay

## 2024-06-20 NOTE — Progress Notes (Unsigned)
 Ben Jackson D[facility] [address] [place] [address] Phone: [phone]   Assessment and Plan:     There are no diagnoses linked to this encounter.  ***   Pertinent previous records reviewed include ***    Follow Up: ***     Subjective:   I, Charisa Twitty, am serving as a Neurosurgeon for Doctor Morene Mace  Chief Complaint: left elbow,knee, and bilat ankle   HPI:   06/21/2024 Patient is a 73 year old male with left elbow, knee, and bilat ankle pain. Patient states   Relevant Historical Information: ***  Additional pertinent review of systems negative.   Current Outpatient Medications:    acetaminophen  (TYLENOL ) 650 MG CR tablet, Take 1,300 mg by mouth in the morning and at bedtime. Arthritis strength, Disp: , Rfl:    ADVAIR DISKUS 250-50 MCG/DOSE AEPB, Inhale 1 puff into the lungs 2 (two) times daily., Disp: , Rfl:    apixaban  (ELIQUIS ) 5 MG TABS tablet, Take 1 tablet (5 mg total) by mouth 2 (two) times daily., Disp: 180 tablet, Rfl: 1   carvedilol  (COREG ) 25 MG tablet, TAKE 1 TABLET(25 MG) BY MOUTH TWICE DAILY WITH A MEAL, Disp: 180 tablet, Rfl: 3   cholecalciferol (VITAMIN D3) 25 MCG (1000 UT) tablet, Take 1,000 Units by mouth 2 times daily at 12 noon and 4 pm., Disp: , Rfl:    CIALIS 20 MG tablet, Take 20 mg by mouth daily as needed for erectile dysfunction. , Disp: , Rfl: 0   cyclobenzaprine (FLEXERIL) 10 MG tablet, Take 10 mg by mouth 3 (three) times daily., Disp: , Rfl:    empagliflozin  (JARDIANCE ) 10 MG TABS tablet, Take 1 tablet (10 mg total) by mouth daily before breakfast., Disp: 90 tablet, Rfl: 3   fluticasone  (FLONASE ) 50 MCG/ACT nasal spray, Place 1 spray into both nostrils 2 (two) times daily. , Disp: , Rfl: 0   furosemide  (LASIX ) 80 MG tablet, Take 1 tablet (80 mg total) by mouth 2 (two) times daily., Disp: 360 tablet, Rfl: 3   gabapentin  (NEURONTIN ) 100 MG capsule, Take 2 capsules (200 mg total) by mouth at  bedtime., Disp: 180 capsule, Rfl: 3   Multiple Vitamins-Minerals (PRESERVISION AREDS PO), Take 1 capsule by mouth 2 (two) times daily., Disp: , Rfl:    omeprazole (PRILOSEC OTC) 20 MG tablet, Take 20 mg by mouth daily., Disp: , Rfl:    potassium chloride  SA (KLOR-CON  M) 20 MEQ tablet, Patient takes 2 tablets by mouth daily., Disp: 180 tablet, Rfl: 1   sacubitril -valsartan  (ENTRESTO ) 49-51 MG, TAKE 1 TABLET BY MOUTH TWICE DAILY, Disp: 180 tablet, Rfl: 3   spironolactone  (ALDACTONE ) 25 MG tablet, TAKE 1 TABLET(25 MG) BY MOUTH DAILY, Disp: 90 tablet, Rfl: 3   tamsulosin (FLOMAX) 0.4 MG CAPS capsule, Take 0.4 mg by mouth daily., Disp: , Rfl:    tirzepatide  (ZEPBOUND ) 10 MG/0.5ML Pen, Inject 10 mg into the skin once a week., Disp: 2 mL, Rfl: 0   vitamin B-12 (CYANOCOBALAMIN ) 1000 MCG tablet, Take 1,000 mcg by mouth daily., Disp: , Rfl:    zolpidem  (AMBIEN  CR) 12.5 MG CR tablet, Take 12.5 mg by mouth at bedtime as needed for sleep., Disp: , Rfl:    Objective:     There were no vitals filed for this visit.    There is no height or weight on file to calculate BMI.    Physical Exam:    ***   Electronically signed by:  Ben Jackson D[facility] 7:40 AM 06/20/24

## 2024-06-21 ENCOUNTER — Ambulatory Visit (INDEPENDENT_AMBULATORY_CARE_PROVIDER_SITE_OTHER)

## 2024-06-21 ENCOUNTER — Ambulatory Visit: Admitting: Sports Medicine

## 2024-06-21 ENCOUNTER — Ambulatory Visit: Payer: Self-pay | Admitting: Sports Medicine

## 2024-06-21 VITALS — BP 102/60 | HR 98 | Ht 71.0 in | Wt 293.0 lb

## 2024-06-21 DIAGNOSIS — M25571 Pain in right ankle and joints of right foot: Secondary | ICD-10-CM

## 2024-06-21 DIAGNOSIS — N189 Chronic kidney disease, unspecified: Secondary | ICD-10-CM

## 2024-06-21 DIAGNOSIS — M25562 Pain in left knee: Secondary | ICD-10-CM

## 2024-06-21 DIAGNOSIS — M25522 Pain in left elbow: Secondary | ICD-10-CM

## 2024-06-21 DIAGNOSIS — M1039 Gout due to renal impairment, multiple sites: Secondary | ICD-10-CM

## 2024-06-21 DIAGNOSIS — M25572 Pain in left ankle and joints of left foot: Secondary | ICD-10-CM | POA: Diagnosis not present

## 2024-06-21 DIAGNOSIS — M2550 Pain in unspecified joint: Secondary | ICD-10-CM

## 2024-06-21 LAB — URIC ACID: Uric Acid, Serum: 6.3 mg/dL (ref 4.0–7.8)

## 2024-06-21 NOTE — Patient Instructions (Addendum)
 Lab work uric acid level on the way out. Will contact you about lab results once they are back. Follow up in 1 month.

## 2024-06-26 ENCOUNTER — Telehealth: Payer: Self-pay | Admitting: Acute Care

## 2024-06-26 NOTE — Telephone Encounter (Signed)
 Patient called in and left VM to schedule October scan. Left VM for pt to call back to schedule at [phone].

## 2024-07-02 NOTE — Progress Notes (Signed)
 Cardiology Office Note Date:  07/02/2024  Patient ID:  Nicholas Foley, Nicholas Foley [birth year], MRN [id number] PCP:  Dyane Anthony RAMAN, FNP  Cardiologist:  Dr. Claudene (inactive) AHF: Dr. Cherrie Electrophysiologist: Dr. Kelsie >> Dr. Cindie   Chief Complaint:   6 mo  History of Present Illness: Nicholas Foley is a 73 y.o. male with history of  OSA w/CPAP, HTN, morbid obesity, AFib, NICM, chronic CHF (systolic),  LBBB CRT-P  He saw Dr. Cindie 10/26/23, fatigued, no energy, worn out, noted a recent increase in his Entresto  No AFib, intact device function Amiodarone  stopped  He saw the AHF team 2.26.25, again, reported reduced energy, stamina, asked perhaps is long haul COVID, some dizziness when bending forward Good CPAP compliance Felt volume stable, condieration for CPX In the future Fatigue suspect multifocal, obesity likely driving insurance denied GLP1   TODAY  He continues to intentionally lose weight, Zep bound has facilitated this. No CP, palpitations or SOB No SOB, no DOE He is getting lightheaded, dizzy., regularly upon standing > though still getting his work outs done. No near syncope or syncope On occasion has gotten a little weak seated No bleeding or signs of bleeding Mentions historically has had issues with low iron.   Device information MDT CRT-P implanted 12/21/2018  AFib/AAD hx Diagnosed 2014 amiodarone  > stopped Dec 2024 AFib ablation 12/30/22  Past Medical History:  Diagnosis Date   Atrial fibrillation with rapid ventricular response (HCC) 08/02/2013   CHF (congestive heart failure) (HCC)    Chronic systolic dysfunction of left ventricle 08/02/2013   BNP greater than 2000    COPD (chronic obstructive pulmonary disease) (HCC)    Moderate by PFTs with response to bronchodilators, May 2014   Erectile dysfunction    Hypertension    Insomnia 08/02/13   Left bundle branch block 08/02/2013   Morbid obesity (HCC) 08/02/2013   OSA (obstructive sleep apnea)     severe OSA with AHI 74/hr now on CPAP at 8cm H2O    Past Surgical History:  Procedure Laterality Date   ATRIAL FIBRILLATION ABLATION N/A 12/30/2022   Procedure: ATRIAL FIBRILLATION ABLATION;  Surgeon: Cindie Ole DASEN, MD;  Location: [facility] INVASIVE CV LAB;  Service: Cardiovascular;  Laterality: N/A;   BIV PACEMAKER INSERTION CRT-P N/A 12/14/2018   Medtronic model [id number] Percepta Quad CRT-P MRI Surescan (serial Number [id number]) device implanted by Dr Kelsie for CHF and LBBB   CARDIOVERSION N/A 08/06/2013   Procedure: CARDIOVERSION;  Surgeon: Redell RAMAN Shallow, MD;  Location: [facility] ENDOSCOPY;  Service: Cardiovascular;  Laterality: N/A;  spoke with [name]    CARDIOVERSION N/A 09/03/2013   Procedure: CARDIOVERSION;  Surgeon: Victory LELON Claudene DOUGLAS, MD;  Location: [facility] ENDOSCOPY;  Service: Cardiovascular;  Laterality: N/A;   CARDIOVERSION N/A 07/21/2021   Procedure: CARDIOVERSION;  Surgeon: Rolan Ezra RAMAN, MD;  Location: [facility] ENDOSCOPY;  Service: Cardiovascular;  Laterality: N/A;   CARDIOVERSION N/A 08/13/2022   Procedure: CARDIOVERSION;  Surgeon: Delford Maude BROCKS, MD;  Location: [facility] ENDOSCOPY;  Service: Cardiovascular;  Laterality: N/A;   CARDIOVERSION N/A 09/03/2022   Procedure: CARDIOVERSION;  Surgeon: Cherrie Toribio SAUNDERS, MD;  Location: [facility] ENDOSCOPY;  Service: Cardiovascular;  Laterality: N/A;   CARDIOVERSION N/A 09/28/2022   Procedure: CARDIOVERSION;  Surgeon: Inocencio Soyla Lunger, MD;  Location: [facility] INVASIVE CV LAB;  Service: Cardiovascular;  Laterality: N/A;   chronic systolic dysfu     NASAL SEPTUM SURGERY     RIGHT/LEFT HEART CATH AND CORONARY ANGIOGRAPHY N/A 12/04/2018   Procedure: RIGHT/LEFT HEART  CATH AND CORONARY ANGIOGRAPHY;  Surgeon: Claudene Victory ORN, MD;  Location: [facility] INVASIVE CV LAB;  Service: Cardiovascular;  Laterality: N/A;   TEE WITHOUT CARDIOVERSION N/A 08/06/2013   Procedure: TRANSESOPHAGEAL ECHOCARDIOGRAM (TEE);  Surgeon: Redell GORMAN Shallow, MD;  Location: [facility] ENDOSCOPY;  Service: Cardiovascular;   Laterality: N/A;   TEE WITHOUT CARDIOVERSION N/A 12/30/2022   Procedure: TRANSESOPHAGEAL ECHOCARDIOGRAM (TEE);  Surgeon: Cherrie Toribio SAUNDERS, MD;  Location: [facility] INVASIVE CV LAB;  Service: Cardiovascular;  Laterality: N/A;   VASECTOMY      Current Outpatient Medications  Medication Sig Dispense Refill   acetaminophen  (TYLENOL ) 650 MG CR tablet Take 1,300 mg by mouth in the morning and at bedtime. Arthritis strength     ADVAIR DISKUS 250-50 MCG/DOSE AEPB Inhale 1 puff into the lungs 2 (two) times daily.     apixaban  (ELIQUIS ) 5 MG TABS tablet Take 1 tablet (5 mg total) by mouth 2 (two) times daily. 180 tablet 1   carvedilol  (COREG ) 25 MG tablet TAKE 1 TABLET(25 MG) BY MOUTH TWICE DAILY WITH A MEAL 180 tablet 3   cholecalciferol (VITAMIN D3) 25 MCG (1000 UT) tablet Take 1,000 Units by mouth 2 times daily at 12 noon and 4 pm.     CIALIS 20 MG tablet Take 20 mg by mouth daily as needed for erectile dysfunction.   0   cyclobenzaprine (FLEXERIL) 10 MG tablet Take 10 mg by mouth 3 (three) times daily.     empagliflozin  (JARDIANCE ) 10 MG TABS tablet Take 1 tablet (10 mg total) by mouth daily before breakfast. 90 tablet 3   fluticasone  (FLONASE ) 50 MCG/ACT nasal spray Place 1 spray into both nostrils 2 (two) times daily.   0   furosemide  (LASIX ) 80 MG tablet Take 1 tablet (80 mg total) by mouth 2 (two) times daily. 360 tablet 3   gabapentin  (NEURONTIN ) 100 MG capsule Take 2 capsules (200 mg total) by mouth at bedtime. 180 capsule 3   Multiple Vitamins-Minerals (PRESERVISION AREDS PO) Take 1 capsule by mouth 2 (two) times daily.     omeprazole (PRILOSEC OTC) 20 MG tablet Take 20 mg by mouth daily.     sacubitril -valsartan  (ENTRESTO ) 49-51 MG TAKE 1 TABLET BY MOUTH TWICE DAILY 180 tablet 3   spironolactone  (ALDACTONE ) 25 MG tablet TAKE 1 TABLET(25 MG) BY MOUTH DAILY 90 tablet 3   tamsulosin (FLOMAX) 0.4 MG CAPS capsule Take 0.4 mg by mouth daily.     tirzepatide  (ZEPBOUND ) 10 MG/0.5ML Pen Inject 10 mg into  the skin once a week. 2 mL 0   vitamin B-12 (CYANOCOBALAMIN ) 1000 MCG tablet Take 1,000 mcg by mouth daily.     zolpidem  (AMBIEN  CR) 12.5 MG CR tablet Take 12.5 mg by mouth at bedtime as needed for sleep.     No current facility-administered medications for this visit.    Allergies:   Ace inhibitors and Angiotensin receptor blockers   Social History:  The patient  reports that he quit smoking about 11 years ago. His smoking use included cigarettes and cigars. He started smoking about 54 years ago. He has a 64.5 pack-year smoking history. He has never used smokeless tobacco. He reports current alcohol use. He reports that he does not use drugs.   Family History:  The patient's family history includes Atrial fibrillation in his mother and sister; COPD in his father; Cerebral aneurysm in his brother; Healthy in his daughter; Heart failure in his father.  ROS:  Please see the history of present illness.  All other systems are reviewed and otherwise negative.   PHYSICAL EXAM:  VS:  There were no vitals taken for this visit. BMI: There is no height or weight on file to calculate BMI. Well nourished, well developed, in no acute distress HEENT: normocephalic, atraumatic Neck: no JVD, carotid bruits or masses Cardiac: RRR; no significant murmurs, no rubs, or gallops Lungs: CTA b/l, no wheezing, rhonchi or rales Abd: soft, nontender MS: no deformity or  atrophy Ext: no edema Skin: warm and dry, no rash Neuro:  No gross deficits appreciated Psych: euthymic mood, full affect  PPM site is stable, no tethering or discomfort   EKG:  not done today 01/18/24 excellent QRS morphology,  Device interrogation done today and reviewed by myself:  Battery and lead measurements are good 93% BP Infrequent NSVT Low AFib burden <1% OptiVol looks good   09/20/23: TTE 1. Left ventricular ejection fraction, by estimation, is 45 to 50%. The  left ventricle has mildly decreased function. The left  ventricle  demonstrates global hypokinesis. There is moderate concentric left  ventricular hypertrophy. Left ventricular  diastolic parameters are indeterminate.   2. Right ventricular systolic function is normal. The right ventricular  size is normal. Tricuspid regurgitation signal is inadequate for assessing  PA pressure.   3. The mitral valve is normal in structure. No evidence of mitral valve  regurgitation. No evidence of mitral stenosis.   4. The aortic valve is normal in structure. Aortic valve regurgitation is  not visualized. No aortic stenosis is present.   5. There is mild dilatation of the aortic root, measuring 41 mm.   6. The inferior vena cava is normal in size with greater than 50%  respiratory variability, suggesting right atrial pressure of 3 mmHg.   12/30/22: EPS/ablation CONCLUSIONS: 1. Successful PVI 2. Successful ablation/isolation of the posterior wall 3. Intracardiac echo reveals trivial-small pericardial effusion, dilated LA 4. Transesophageal echocardiogram before the procedure showed no LAA thrombus 5. No early apparent complications. 6. Colchicine  0.6mg  PO BID x 5 days 7. Protonix  40mg  PO daily x 45 days    04/12/22; TTE  1. There is significant LV dyysynchrony. Left ventricular ejection  fraction, by estimation, is 50 to 55%. The left ventricle has low normal  function. The left ventricle has no regional wall motion abnormalities.  There is moderate concentric left  ventricular hypertrophy. Left ventricular diastolic parameters are  consistent with Grade I diastolic dysfunction (impaired relaxation).   2. Right ventricular systolic function is normal. The right ventricular  size is normal.   3. Left atrial size was mildly dilated.   4. The mitral valve is normal in structure. No evidence of mitral valve  regurgitation. No evidence of mitral stenosis.   5. The aortic valve is tricuspid. There is mild calcification of the  aortic valve. Aortic valve  regurgitation is not visualized. No aortic  stenosis is present.   6. The inferior vena cava is normal in size with greater than 50%  respiratory variability, suggesting right atrial pressure of 3 mmHg.    L/RHC (1/20) No coronary disease.  RA 13 PA 37/20 (27)  PCWP 14 CO 8.5 CI 3.3   Recent Labs: 07/22/2023: ALT 18; Magnesium 2.3; TSH 2.570 11/04/2023: B Natriuretic Peptide 27.6 01/18/2024: Hemoglobin 11.2; Platelets 279 02/15/2024: BUN 37; Creatinine, Ser 1.87; Potassium 4.3; Sodium 138  No results found for requested labs within last 365 days.   CrCl cannot be calculated (Patient's most recent lab result is older than the  maximum 21 days allowed.).   Wt Readings from Last 3 Encounters:  06/21/24 293 lb (132.9 kg)  03/27/24 295 lb (133.8 kg)  02/29/24 295 lb (133.8 kg)     Other studies reviewed: Additional studies/records reviewed today include: summarized above  ASSESSMENT AND PLAN:  CRT-P Intact function No programming changes made  Paroxysmal Afib CHA2DS2Vasc is 3, on Eliquis , appropriately dosed  <1% Burden  NICM Chronic CHF Recovered LVEF >> 45-50% by his last echo 2024 No symptoms of volume OL Exam and OptiVol look good Hypotensive with symptoms or dizziness/orthostatic especially 93 % BP  Will go ahead and reduce his Entresto  dose He is having some PVCs left his coreg  alone Advised he not take coreg  dose tonight No Entresto  tonight/tomorrow > Fiday to start the lowered dose C/w Dr. Boone, sees them soon   Disposition:remotes as usual in clinic in a year again, sooner if needed.  Sess AHF team soon    Current medicines are reviewed at length with the patient today.  The patient did not have any concerns regarding medicines.  Bonney Charlies Arthur, PA-C 07/02/2024 7:50 AM     [facility] [address] [place] [address] [phone] (office)  [phone] (fax)

## 2024-07-04 ENCOUNTER — Other Ambulatory Visit (HOSPITAL_COMMUNITY): Payer: Self-pay

## 2024-07-04 ENCOUNTER — Ambulatory Visit: Attending: Physician Assistant | Admitting: Physician Assistant

## 2024-07-04 ENCOUNTER — Encounter: Payer: Self-pay | Admitting: Physician Assistant

## 2024-07-04 VITALS — BP 86/57 | HR 92 | Ht 71.0 in | Wt 286.0 lb

## 2024-07-04 DIAGNOSIS — Z950 Presence of cardiac pacemaker: Secondary | ICD-10-CM

## 2024-07-04 DIAGNOSIS — I5022 Chronic systolic (congestive) heart failure: Secondary | ICD-10-CM | POA: Diagnosis present

## 2024-07-04 DIAGNOSIS — I480 Paroxysmal atrial fibrillation: Secondary | ICD-10-CM | POA: Insufficient documentation

## 2024-07-04 DIAGNOSIS — I428 Other cardiomyopathies: Secondary | ICD-10-CM | POA: Diagnosis present

## 2024-07-04 LAB — CUP PACEART INCLINIC DEVICE CHECK
Battery Remaining Longevity: 34 mo
Battery Voltage: 2.92 V
Brady Statistic AP VP Percent: 0.32 %
Brady Statistic AP VS Percent: 0.02 %
Brady Statistic AS VP Percent: 93.34 %
Brady Statistic AS VS Percent: 6.31 %
Brady Statistic RA Percent Paced: 0.38 %
Brady Statistic RV Percent Paced: 53.01 %
Date Time Interrogation Session: 20250813183231
Implantable Lead Connection Status: 753985
Implantable Lead Connection Status: 753985
Implantable Lead Connection Status: 753985
Implantable Lead Implant Date: 20200123
Implantable Lead Implant Date: 20200123
Implantable Lead Implant Date: 20200123
Implantable Lead Location: 753858
Implantable Lead Location: 753859
Implantable Lead Location: 753860
Implantable Lead Model: 4598
Implantable Lead Model: 5076
Implantable Lead Model: 5076
Implantable Pulse Generator Implant Date: 20200123
Lead Channel Impedance Value: 1064 Ohm
Lead Channel Impedance Value: 1064 Ohm
Lead Channel Impedance Value: 1083 Ohm
Lead Channel Impedance Value: 1216 Ohm
Lead Channel Impedance Value: 1254 Ohm
Lead Channel Impedance Value: 1501 Ohm
Lead Channel Impedance Value: 1577 Ohm
Lead Channel Impedance Value: 399 Ohm
Lead Channel Impedance Value: 494 Ohm
Lead Channel Impedance Value: 532 Ohm
Lead Channel Impedance Value: 551 Ohm
Lead Channel Impedance Value: 570 Ohm
Lead Channel Impedance Value: 760 Ohm
Lead Channel Impedance Value: 817 Ohm
Lead Channel Pacing Threshold Amplitude: 0.5 V
Lead Channel Pacing Threshold Amplitude: 0.75 V
Lead Channel Pacing Threshold Amplitude: 3.25 V
Lead Channel Pacing Threshold Pulse Width: 0.4 ms
Lead Channel Pacing Threshold Pulse Width: 0.4 ms
Lead Channel Pacing Threshold Pulse Width: 0.8 ms
Lead Channel Sensing Intrinsic Amplitude: 0.75 mV
Lead Channel Sensing Intrinsic Amplitude: 2.75 mV
Lead Channel Sensing Intrinsic Amplitude: 6.75 mV
Lead Channel Sensing Intrinsic Amplitude: 9.125 mV
Lead Channel Setting Pacing Amplitude: 1.5 V
Lead Channel Setting Pacing Amplitude: 2.5 V
Lead Channel Setting Pacing Amplitude: 4 V
Lead Channel Setting Pacing Pulse Width: 0.4 ms
Lead Channel Setting Pacing Pulse Width: 0.8 ms
Lead Channel Setting Sensing Sensitivity: 2 mV
Zone Setting Status: 755011
Zone Setting Status: 755011

## 2024-07-04 MED ORDER — SACUBITRIL-VALSARTAN 24-26 MG PO TABS: 1.0000 | ORAL_TABLET | Freq: Two times a day (BID) | ORAL | 3 refills | Status: DC

## 2024-07-04 MED ORDER — ZEPBOUND 12.5 MG/0.5ML ~~LOC~~ SOAJ
12.5000 mg | SUBCUTANEOUS | 0 refills | Status: DC
  Filled 2024-07-04 (×2): qty 2, 28d supply, fill #0

## 2024-07-04 NOTE — Patient Instructions (Addendum)
 Medication Instructions:   NO COREG  TONIGHT  8-13   2.   NO ENTRESTO   TODAY  8-13 OR TOMORROW 8-14   THEN START TAKING NEW ENTRESO DOSE FRIDAY  8-15  :  ENTRESTO   24-26  TWICE A DAY   *If you need a refill on your cardiac medications before your next appointment, please call your pharmacy*   Lab Work:   PLEASE GO DOWN STAIRS  [facility]  FIRST FLOOR   ( GET OFF ELEVATORS WALK TOWARDS WAITING AREA LAB LOCATED BY PHARMACY):  BMET AND CBC  TODAY    If you have labs (blood work) drawn today and your tests are completely normal, you will receive your results only by: MyChart Message (if you have MyChart) OR A paper copy in the mail If you have any lab test that is abnormal or we need to change your treatment, we will call you to review the results.    Testing/Procedures: NONE ORDERED  TODAY    Follow-Up:  At [facility], you and your health needs are our priority.  As part of our continuing mission to provide you with exceptional heart care, our providers are all part of one team.  This team includes your primary Cardiologist (physician) and Advanced Practice Providers or APPs (Physician Assistants and Nurse Practitioners) who all work together to provide you with the care you need, when you need it.  Your next appointment:   6 month(s)  Provider:   You may see OLE ONEIDA HOLTS, MD or   Charlies Arthur, PA-C   We recommend signing up for the patient portal called MyChart.  Sign up information is provided on this After Visit Summary.  MyChart is used to connect with patients for Virtual Visits (Telemedicine).  Patients are able to view lab/test results, encounter notes, upcoming appointments, etc.  Non-urgent messages can be sent to your provider as well.   To learn more about what you can do with MyChart, go to [email or web address].   Other Instructions

## 2024-07-04 NOTE — Addendum Note (Signed)
 Addended by: DARRELL BRUCKNER on: 07/04/2024 04:02 PM   Modules accepted: Orders

## 2024-07-05 ENCOUNTER — Ambulatory Visit: Payer: Self-pay | Admitting: Cardiology

## 2024-07-05 LAB — BASIC METABOLIC PANEL WITH GFR
BUN/Creatinine Ratio: 14 (ref 10–24)
BUN: 28 mg/dL — ABNORMAL HIGH (ref 8–27)
CO2: 23 mmol/L (ref 20–29)
Calcium: 9.2 mg/dL (ref 8.6–10.2)
Chloride: 98 mmol/L (ref 96–106)
Creatinine, Ser: 2 mg/dL — ABNORMAL HIGH (ref 0.76–1.27)
Glucose: 86 mg/dL (ref 70–99)
Potassium: 4.3 mmol/L (ref 3.5–5.2)
Sodium: 139 mmol/L (ref 134–144)
eGFR: 35 mL/min/1.73 — ABNORMAL LOW (ref >59)

## 2024-07-05 LAB — CBC
Hematocrit: 45.7 % (ref 37.5–51.0)
Hemoglobin: 15.2 g/dL (ref 13.0–17.7)
MCH: 33.6 pg — ABNORMAL HIGH (ref 26.6–33.0)
MCHC: 33.3 g/dL (ref 31.5–35.7)
MCV: 101 fL — ABNORMAL HIGH (ref 79–97)
Platelets: 248 x10E3/uL (ref 150–450)
RBC: 4.52 x10E6/uL (ref 4.14–5.80)
RDW: 14.2 % (ref 11.6–15.4)
WBC: 11.8 x10E3/uL — ABNORMAL HIGH (ref 3.4–10.8)

## 2024-07-09 ENCOUNTER — Other Ambulatory Visit (HOSPITAL_COMMUNITY): Payer: Self-pay

## 2024-07-09 ENCOUNTER — Ambulatory Visit: Attending: Cardiology

## 2024-07-09 DIAGNOSIS — Z950 Presence of cardiac pacemaker: Secondary | ICD-10-CM

## 2024-07-09 DIAGNOSIS — I5022 Chronic systolic (congestive) heart failure: Secondary | ICD-10-CM

## 2024-07-10 NOTE — Progress Notes (Signed)
 EPIC Encounter for ICM Monitoring  Patient Name: Nicholas Foley is a 73 y.o. male Date: 07/10/2024 Primary Care Physican: Dyane Anthony RAMAN, FNP Primary Cardiologist: Smith/Bensimhon Electrophysiologist: Cindie Nephrologist: [facility]: Dr Macel Pore Pacing:  92.8% 10/26/2023 Office Weight: 301 lbs 01/30/2024 Weight: 295 lbs 04/09/2024 Weight: 302 lbs 06/08/2024 Weight: 290 lbs 07/10/2024 Weight: 285 lbs (total of 40 lb weight loss)    Since 04-Jul-2024 Time in AT/AF  0.0 hr/day (0.0%)  Taking Eliquis          Spoke with patient and heart failure questions reviewed.  Transmission results reviewed.  Pt reports very low BP and followed up with Dr Cindie last week and meds were adjusted.  He feels better since meds were adjusted and BP is back to normal.  Spironolactone  was held for a few days per instructions at the office which may have contributed to decreased impedance.         Optivol thoracic impedance suggesting possible fluid accumulation starting 8/10.   Prescribed: Furosemide  80 mg take 1 tablet(s) (80 mg total) twice a day.   Potassium 20 mEq take 2 tablet(s) daily Spironolactone  25 mg take 1 tablet daily   Labs: 07/04/2024 Creatinine 2.00, BUN 28, Potassium 4.3, Sodium 139, GFR 35 02/15/2024 Creatinine 1.87, BUN 37, Potassium 4.3, Sodium 138, GFR 38 01/26/2024 Creatinine 1.67, BUN 38, Potassium 4.4, Sodium 138, GFR 44 01/18/2024 Creatinine 2.17, BUN 42, Potassium 5.4, Sodium 136, GFR 32 A complete set of results can be found in Results Review.   Recommendations:  He will monitor for fluid sx and call if any occur.  Fluid levels will be rechecked at 8/26 HF clinic visit.    Follow-up plan: ICM clinic phone appointment on 08/13/2024 (fluid level recheck at 8/26 HF clinic visit).   91 day device clinic remote transmission 07/18/2024.     EP/Cardiology Office Visits:  07/17/2024 with Dr Cherrie    Recall 08/17/2024 with Dr Cindie.      Copy of ICM check sent to  Dr Cindie.    3 month ICM trend: 07/09/2024.    12-14 Month ICM trend:     [name], RN 07/10/2024 10:45 AM

## 2024-07-16 ENCOUNTER — Telehealth: Payer: Self-pay | Admitting: Internal Medicine

## 2024-07-16 NOTE — Telephone Encounter (Signed)
 Called to confirm/remind patient of their appointment at [facility] on 07/17/24.   Appointment:   [] Confirmed  [x] Left mess   [] No answer/No voice mail  [] VM Full/unable to leave message  [] Phone not in service  Patient reminded to bring all medications and/or complete list.  Confirmed patient has transportation. Gave directions, instructed to utilize valet parking.

## 2024-07-17 ENCOUNTER — Ambulatory Visit: Attending: Internal Medicine | Admitting: Internal Medicine

## 2024-07-17 ENCOUNTER — Encounter: Payer: Self-pay | Admitting: Internal Medicine

## 2024-07-17 VITALS — BP 112/64 | HR 54 | Wt 290.1 lb

## 2024-07-17 DIAGNOSIS — I447 Left bundle-branch block, unspecified: Secondary | ICD-10-CM | POA: Insufficient documentation

## 2024-07-17 DIAGNOSIS — I480 Paroxysmal atrial fibrillation: Secondary | ICD-10-CM | POA: Diagnosis not present

## 2024-07-17 DIAGNOSIS — I428 Other cardiomyopathies: Secondary | ICD-10-CM | POA: Diagnosis not present

## 2024-07-17 DIAGNOSIS — Z6841 Body Mass Index (BMI) 40.0 and over, adult: Secondary | ICD-10-CM | POA: Diagnosis not present

## 2024-07-17 DIAGNOSIS — Z7901 Long term (current) use of anticoagulants: Secondary | ICD-10-CM | POA: Diagnosis not present

## 2024-07-17 DIAGNOSIS — G4733 Obstructive sleep apnea (adult) (pediatric): Secondary | ICD-10-CM | POA: Insufficient documentation

## 2024-07-17 DIAGNOSIS — I5042 Chronic combined systolic (congestive) and diastolic (congestive) heart failure: Secondary | ICD-10-CM | POA: Insufficient documentation

## 2024-07-17 DIAGNOSIS — Z87891 Personal history of nicotine dependence: Secondary | ICD-10-CM | POA: Diagnosis not present

## 2024-07-17 DIAGNOSIS — Z7984 Long term (current) use of oral hypoglycemic drugs: Secondary | ICD-10-CM | POA: Insufficient documentation

## 2024-07-17 DIAGNOSIS — J449 Chronic obstructive pulmonary disease, unspecified: Secondary | ICD-10-CM | POA: Insufficient documentation

## 2024-07-17 DIAGNOSIS — N1832 Chronic kidney disease, stage 3b: Secondary | ICD-10-CM | POA: Insufficient documentation

## 2024-07-17 DIAGNOSIS — I130 Hypertensive heart and chronic kidney disease with heart failure and stage 1 through stage 4 chronic kidney disease, or unspecified chronic kidney disease: Secondary | ICD-10-CM | POA: Insufficient documentation

## 2024-07-17 DIAGNOSIS — Z950 Presence of cardiac pacemaker: Secondary | ICD-10-CM | POA: Insufficient documentation

## 2024-07-17 DIAGNOSIS — I110 Hypertensive heart disease with heart failure: Secondary | ICD-10-CM | POA: Diagnosis present

## 2024-07-17 DIAGNOSIS — I5022 Chronic systolic (congestive) heart failure: Secondary | ICD-10-CM

## 2024-07-17 MED ORDER — CARVEDILOL 12.5 MG PO TABS: 12.5000 mg | ORAL_TABLET | Freq: Two times a day (BID) | ORAL | 6 refills | Status: DC

## 2024-07-17 NOTE — Progress Notes (Signed)
 [facility] Note   Date:  07/17/2024   ID:  Nicholas Foley, DOB June 10, 1951, MRN [id number]  Location: Home  Provider location: [facility] Type of Visit: Established patient  PCP:  Nicholas Anthony RAMAN, FNP  Primary Cardiologist:  Nicholas LELON Claudene DOUGLAS, MD (Inactive) Nephrology: Dr. Macel HF Cardiologist: Nicholas Foley  Chief Complaint: Heart Failure follow-up    HPI: [name] is a 73 y.o. with morbid obesity, chronic systolic heart failure, COPD, OSA, PAF, previously on amio-> stopped 2018,  HTN, LBBB, Medtronic CRT-P, and former smoker.   He has h/o NICM which seems to date back to 2014. Had cardiac cath in 1/20 with no CAD and well compensated filling pressures.    Admitted in 1/20 for CRT-P device in setting of LBBB. Intially he felt a lot better but has gradually declined.   Echo 01/30/20 EF 40-45% (read as 35-40%)  Echo EF 2/22 45% (in some images 45-50%) Limited by septal paradox.   Got COVID again in 1/22 but was a mild case.   Had recurrent AF. Underwent DC-CV 8/22.   Echo 04/12/22 showed EF 50-55%, LV dyysynchrony, grade I DD,  RV ok.   Developed recurrent AF in early 9/23. Seen in AF clinic. Amio started. Had DC-CV attempt on 08/13/22 which failed x 3. Successful DCCV 11/23 to NSR. Acute visit 12/23, NYHA II-III and volume up. Instructed to take metolazone  2.5/40 KCL x 1.  TEE 12/30/22 showing EF 55%, moderate LAE, RV mildy HK, moderate pericardial effusion and no LAA clot. Then had successful PVI. Started on colchicine  and PPI.  Had AF ablation in 2/24  Echo 10/24 EF 45-50%, RV ok, aortic root dilation 41 mm  Today he returns for HF follow up. Feels good. Going to gym several times per week. Mild DOE. Has been on ZepBound  and lost 30-40 pounds. Fluid has been up and down a bit and he is followed by Nicholas Foley in [facility] clinic. Compliant with meds,   Cardiac Studies - Echo (10/24): EF 45-50%, RV ok  - TEE (2/24): EF 55%,  moderate LAE, RV mildy HK, moderate pericardial effusion and no LAA clot.  - Echo (5/23): EF 50-55%, severe LV dyysynchrony, grade I DD< RV ok,   - Echo (2/22): EF45%  - Echo (3/21): EF 40-45% (read as 35-40%).  - CPX (09/18/19):  Limited by obesity and mild heart failure Peak VO2 15.1  (80% predicted peak VO2) - when corrected to ibw pVO2 27.5 Slope 33 RER 1.02    - L/RHC (1/20) No coronary disease.  RA 13 PA 37/20 (27)  PCWP 14 CO 8.5 CI 3.3    - Echo (11/20): RV normal EF 45-50%   - Echo (11/19): EF 35-40% Grade II DD     Past Medical History:  Diagnosis Date   Atrial fibrillation with rapid ventricular response (HCC) 08/02/2013   CHF (congestive heart failure) (HCC)    Chronic systolic dysfunction of left ventricle 08/02/2013   BNP greater than 2000    COPD (chronic obstructive pulmonary disease) (HCC)    Moderate by PFTs with response to bronchodilators, May 2014   Erectile dysfunction    Hypertension    Insomnia 08/02/13   Left bundle branch block 08/02/2013   Morbid obesity (HCC) 08/02/2013   OSA (obstructive sleep apnea)    severe OSA with AHI 74/hr now on CPAP at 8cm H2O   Past Surgical History:  Procedure Laterality Date   ATRIAL  FIBRILLATION ABLATION N/A 12/30/2022   Procedure: ATRIAL FIBRILLATION ABLATION;  Surgeon: Nicholas Ole DASEN, MD;  Location: [facility] INVASIVE CV LAB;  Service: Cardiovascular;  Laterality: N/A;   BIV PACEMAKER INSERTION CRT-P N/A 12/14/2018   Medtronic model [id number] Percepta Quad CRT-P MRI Surescan (serial Number [id number]) device implanted by Dr Nicholas Foley for CHF and LBBB   CARDIOVERSION N/A 08/06/2013   Procedure: CARDIOVERSION;  Surgeon: Nicholas Foley Shallow, MD;  Location: [facility] ENDOSCOPY;  Service: Cardiovascular;  Laterality: N/A;  spoke with Nicholas Foley    CARDIOVERSION N/A 09/03/2013   Procedure: CARDIOVERSION;  Surgeon: Nicholas LELON Claudene DOUGLAS, MD;  Location: [facility] ENDOSCOPY;  Service: Cardiovascular;  Laterality: N/A;   CARDIOVERSION N/A 07/21/2021    Procedure: CARDIOVERSION;  Surgeon: Nicholas Ezra GORMAN, MD;  Location: [facility] ENDOSCOPY;  Service: Cardiovascular;  Laterality: N/A;   CARDIOVERSION N/A 08/13/2022   Procedure: CARDIOVERSION;  Surgeon: Nicholas Maude BROCKS, MD;  Location: [facility] ENDOSCOPY;  Service: Cardiovascular;  Laterality: N/A;   CARDIOVERSION N/A 09/03/2022   Procedure: CARDIOVERSION;  Surgeon: Nicholas Toribio SAUNDERS, MD;  Location: [facility] ENDOSCOPY;  Service: Cardiovascular;  Laterality: N/A;   CARDIOVERSION N/A 09/28/2022   Procedure: CARDIOVERSION;  Surgeon: Nicholas Soyla Lunger, MD;  Location: [facility] INVASIVE CV LAB;  Service: Cardiovascular;  Laterality: N/A;   chronic systolic dysfu     NASAL SEPTUM SURGERY     RIGHT/LEFT HEART CATH AND CORONARY ANGIOGRAPHY N/A 12/04/2018   Procedure: RIGHT/LEFT HEART CATH AND CORONARY ANGIOGRAPHY;  Surgeon: Claudene Nicholas LELON, MD;  Location: [facility] INVASIVE CV LAB;  Service: Cardiovascular;  Laterality: N/A;   TEE WITHOUT CARDIOVERSION N/A 08/06/2013   Procedure: TRANSESOPHAGEAL ECHOCARDIOGRAM (TEE);  Surgeon: Nicholas Foley Shallow, MD;  Location: [facility] ENDOSCOPY;  Service: Cardiovascular;  Laterality: N/A;   TEE WITHOUT CARDIOVERSION N/A 12/30/2022   Procedure: TRANSESOPHAGEAL ECHOCARDIOGRAM (TEE);  Surgeon: Nicholas Toribio SAUNDERS, MD;  Location: [facility] INVASIVE CV LAB;  Service: Cardiovascular;  Laterality: N/A;   VASECTOMY     Current Outpatient Medications  Medication Sig Dispense Refill   acetaminophen  (TYLENOL ) 650 MG CR tablet Take 1,300 mg by mouth in the morning and at bedtime. Arthritis strength     ADVAIR DISKUS 250-50 MCG/DOSE AEPB Inhale 1 puff into the lungs 2 (two) times daily.     allopurinol (ZYLOPRIM) 100 MG tablet Take 200 mg by mouth daily.     apixaban  (ELIQUIS ) 5 MG TABS tablet Take 1 tablet (5 mg total) by mouth 2 (two) times daily. 180 tablet 1   carvedilol  (COREG ) 25 MG tablet TAKE 1 TABLET(25 MG) BY MOUTH TWICE DAILY WITH A MEAL 180 tablet 3   cholecalciferol (VITAMIN D3) 25 MCG (1000 UT) tablet Take 1,000  Units by mouth 2 times daily at 12 noon and 4 pm.     CIALIS 20 MG tablet Take 20 mg by mouth daily as needed for erectile dysfunction.   0   cyclobenzaprine (FLEXERIL) 10 MG tablet Take 10 mg by mouth 3 (three) times daily.     empagliflozin  (JARDIANCE ) 10 MG TABS tablet Take 1 tablet (10 mg total) by mouth daily before breakfast. 90 tablet 3   ferrous sulfate 325 (65 FE) MG tablet Take 325 mg by mouth daily with breakfast.     fluticasone  (FLONASE ) 50 MCG/ACT nasal spray Place 1 spray into both nostrils 2 (two) times daily.   0   furosemide  (LASIX ) 80 MG tablet Take 1 tablet (80 mg total) by mouth 2 (two) times daily. 360 tablet 3   gabapentin  (NEURONTIN ) 100 MG capsule  Take 2 capsules (200 mg total) by mouth at bedtime. 180 capsule 3   Multiple Vitamins-Minerals (PRESERVISION AREDS PO) Take 1 capsule by mouth 2 (two) times daily.     omeprazole (PRILOSEC OTC) 20 MG tablet Take 20 mg by mouth daily.     sacubitril -valsartan  (ENTRESTO ) 24-26 MG Take 1 tablet by mouth 2 (two) times daily. 180 tablet 3   spironolactone  (ALDACTONE ) 25 MG tablet TAKE 1 TABLET(25 MG) BY MOUTH DAILY 90 tablet 3   tamsulosin (FLOMAX) 0.4 MG CAPS capsule Take 0.4 mg by mouth daily.     tirzepatide  (ZEPBOUND ) 12.5 MG/0.5ML Pen Inject 12.5 mg into the skin once a week. 2 mL 0   vitamin B-12 (CYANOCOBALAMIN ) 1000 MCG tablet Take 1,000 mcg by mouth daily.     zolpidem  (AMBIEN  CR) 12.5 MG CR tablet Take 12.5 mg by mouth at bedtime as needed for sleep.     No current facility-administered medications for this visit.    Allergies:   Ace inhibitors and Angiotensin receptor blockers   Social History:  The patient  reports that he quit smoking about 11 years ago. His smoking use included cigarettes and cigars. He started smoking about 54 years ago. He has a 64.5 pack-year smoking history. He has never used smokeless tobacco. He reports current alcohol use. He reports that he does not use drugs.   Family History:  The  patient's family history includes Atrial fibrillation in his mother and sister; COPD in his father; Cerebral aneurysm in his brother; Healthy in his daughter; Heart failure in his father.   ROS:  Please see the history of present illness.   All other systems are personally reviewed and negative.   Recent Labs: 07/22/2023: ALT 18; Magnesium 2.3; TSH 2.570 11/04/2023: B Natriuretic Peptide 27.6 07/04/2024: BUN 28; Creatinine, Ser 2.00; Hemoglobin 15.2; Platelets 248; Potassium 4.3; Sodium 139  Personally reviewed   Wt Readings from Last 3 Encounters:  07/17/24 290 lb 2 oz (131.6 kg)  07/04/24 286 lb (129.7 kg)  06/21/24 293 lb (132.9 kg)    BP 112/64 (BP Location: Right Arm, Patient Position: Sitting, Cuff Size: Large)   Pulse (!) 54   Wt 290 lb 2 oz (131.6 kg)   SpO2 96%   BMI 40.46 kg/m   Physical Exam General:  Well appearing. No resp difficulty HEENT: normal Neck: supple. no JVD. Carotids 2+ bilat; no bruits. No lymphadenopathy or thryomegaly appreciated. Cor: PMI nondisplaced. Regular rate & rhythm. No rubs, gallops or murmurs. Lungs: clear Abdomen: obese soft, nontender, nondistended. No hepatosplenomegaly. No bruits or masses. Good bowel sounds. Extremities: no cyanosis, clubbing, rash, edema Neuro: alert & orientedx3, cranial nerves grossly intact. moves all 4 extremities w/o difficulty. Affect pleasant   Device interrogation (personally reviewed): 94% VP, 2.6 hr/day activity, no AT/F. Optivol slightly up Personally reviewed   ASSESSMENT AND PLAN: 1. Chronic Combined Systolic/Diastolic HF - NICM (thought due to LBBB). 12/2018 LHC no coronary disease.  - s/p Medtronic CRT-P - Echo (1/19) EF 35-40%. ECHO 11/19 EF 45-50%.  - Echo (11/20): EF read as 35-40% but with Definity  I felt closed to 45% - Echo (3/21): EF read as 35-40% I think 40-45% - Echo EF (2/22): EF 45% (in some images 45-50%) Limited by septal paradox. - CPX test (10/20) suboptimal effort mostly limited to  obesity  - Echo (5/23) EF 50-55%, LV dyysynchrony, grade I DD, RV ok.  - TEE (2/24): EF 55%, moderate LAE, RV mildy HK - Echo (10/24): EF 45-50%, RV  ok - Doing very well. NYHA II. Best I have seen him in years - Continue Entresto  24/26 mg bid (failed 49/51 due to hypotension).  - Continue Lasix  80 mg bid. - Cut carvedilol  25 mg bid.-> to 12.5 bid due to fatigue and periods of low BP   - Continue spiro 25 mg daily. - Continue Jadiance 10 mg daily. - ICD interrogated as above and reviewed with him - F/u 6 months with echp    2. PAF  - Underwent AF ablation 2/24  - Now off amio - No AF on device today - Continue Eliquis    3. OSA - Wears BiPap religiously - No changes   4. Morbid Obesity - Body mass index is 40.46 kg/m. .  - Continue ZepBound   5. COPD  - No longer smoking. - No change. - Followed by Pulmonary  6. CKD 3b - Baseline SCr 1.7-2.0 - Continue SGLT2i. - Follows with Dr. Macel Toribio Fuel, MD  07/17/2024 10:01 AM  [facility] [address] [facility] [place] [address] [phone] (office) [phone] (fax)

## 2024-07-17 NOTE — Patient Instructions (Signed)
 Medication Changes:  DECREASE Carvedilol  12.5mg  (1 tab) two times daily   Testing/Procedures:  Your physician has requested that you have an echocardiogram. Echocardiography is a painless test that uses sound waves to create images of your heart. It provides your doctor with information about the size and shape of your heart and how well your heart's chambers and valves are working. This procedure takes approximately one hour. There are no restrictions for this procedure. Please do NOT wear cologne, perfume, aftershave, or lotions (deodorant is allowed). Please arrive 15 minutes prior to your appointment time.  Please note: We ask at that you not bring children with you during ultrasound (echo/ vascular) testing. Due to room size and safety concerns, children are not allowed in the ultrasound rooms during exams. Our front office staff cannot provide observation of children in our lobby area while testing is being conducted. An adult accompanying a patient to their appointment will only be allowed in the ultrasound room at the discretion of the ultrasound technician under special circumstances. We apologize for any inconvenience.  Someone will contact you in order to schedule your appointment.   Follow-Up in: Please follow up with the Advanced [facility] Failure Clinic in 6 month with Dr. Cherrie. We do not currently have that schedule. Please call us  in January in order to schedule your appointment for February 2026.   Thank you for choosing [facility].    At the [facility] Clinic, you and your health needs are our priority. We have a designated team specialized in the treatment of Heart Failure. This Care Team includes your primary Heart Failure Specialized Cardiologist (physician), Advanced Practice Providers (APPs- Physician Assistants and Nurse Practitioners), and Pharmacist who all work together to provide you with the care you need, when you  need it.   You may see any of the following providers on your designated Care Team at your next follow up:  Dr. Toribio Cherrie Dr. Ezra Shuck Dr. Ria Commander Dr. Morene Brownie Ellouise Class, FNP Jaun Bash, RPH-CPP  Please be sure to bring in all your medications bottles to every appointment.   Need to Contact Us :  If you have any questions or concerns before your next appointment please send us  a message through [place] or call our office at [phone].    TO LEAVE A MESSAGE FOR THE NURSE SELECT OPTION 2, PLEASE LEAVE A MESSAGE INCLUDING: YOUR NAME DATE OF BIRTH CALL BACK NUMBER REASON FOR CALL**this is important as we prioritize the call backs  YOU WILL RECEIVE A CALL BACK THE SAME DAY AS LONG AS YOU CALL BEFORE 4:00 PM

## 2024-07-18 ENCOUNTER — Ambulatory Visit (INDEPENDENT_AMBULATORY_CARE_PROVIDER_SITE_OTHER)

## 2024-07-18 DIAGNOSIS — I428 Other cardiomyopathies: Secondary | ICD-10-CM | POA: Diagnosis not present

## 2024-07-20 ENCOUNTER — Ambulatory Visit: Admitting: Sports Medicine

## 2024-07-20 ENCOUNTER — Ambulatory Visit: Payer: Self-pay | Admitting: Cardiology

## 2024-07-20 LAB — CUP PACEART REMOTE DEVICE CHECK
Battery Remaining Longevity: 31 mo
Battery Voltage: 2.92 V
Brady Statistic AP VP Percent: 0.04 %
Brady Statistic AP VS Percent: 0 %
Brady Statistic AS VP Percent: 93.18 %
Brady Statistic AS VS Percent: 6.78 %
Brady Statistic RA Percent Paced: 0.06 %
Brady Statistic RV Percent Paced: 60.31 %
Date Time Interrogation Session: 20250826195455
Implantable Lead Connection Status: 753985
Implantable Lead Connection Status: 753985
Implantable Lead Connection Status: 753985
Implantable Lead Implant Date: 20200123
Implantable Lead Implant Date: 20200123
Implantable Lead Implant Date: 20200123
Implantable Lead Location: 753858
Implantable Lead Location: 753859
Implantable Lead Location: 753860
Implantable Lead Model: 4598
Implantable Lead Model: 5076
Implantable Lead Model: 5076
Implantable Pulse Generator Implant Date: 20200123
Lead Channel Impedance Value: 1026 Ohm
Lead Channel Impedance Value: 1064 Ohm
Lead Channel Impedance Value: 1083 Ohm
Lead Channel Impedance Value: 1178 Ohm
Lead Channel Impedance Value: 1406 Ohm
Lead Channel Impedance Value: 1463 Ohm
Lead Channel Impedance Value: 380 Ohm
Lead Channel Impedance Value: 475 Ohm
Lead Channel Impedance Value: 513 Ohm
Lead Channel Impedance Value: 513 Ohm
Lead Channel Impedance Value: 513 Ohm
Lead Channel Impedance Value: 722 Ohm
Lead Channel Impedance Value: 798 Ohm
Lead Channel Impedance Value: 855 Ohm
Lead Channel Pacing Threshold Amplitude: 0.5 V
Lead Channel Pacing Threshold Amplitude: 0.75 V
Lead Channel Pacing Threshold Amplitude: 3.25 V
Lead Channel Pacing Threshold Pulse Width: 0.4 ms
Lead Channel Pacing Threshold Pulse Width: 0.4 ms
Lead Channel Pacing Threshold Pulse Width: 0.8 ms
Lead Channel Sensing Intrinsic Amplitude: 0.75 mV
Lead Channel Sensing Intrinsic Amplitude: 0.75 mV
Lead Channel Sensing Intrinsic Amplitude: 5.625 mV
Lead Channel Sensing Intrinsic Amplitude: 5.625 mV
Lead Channel Setting Pacing Amplitude: 1.5 V
Lead Channel Setting Pacing Amplitude: 2.5 V
Lead Channel Setting Pacing Amplitude: 4 V
Lead Channel Setting Pacing Pulse Width: 0.4 ms
Lead Channel Setting Pacing Pulse Width: 0.8 ms
Lead Channel Setting Sensing Sensitivity: 2 mV
Zone Setting Status: 755011
Zone Setting Status: 755011

## 2024-07-27 NOTE — Progress Notes (Signed)
 Nicholas Foley Nicholas Foley [address] [place] [address] Phone: [phone]   Assessment and Plan:     1. Acute bilateral ankle pain (Primary) 2.  Left elbow pain 3. Acute pain of left knee 4. Polyarthralgia 5. Acute gout due to renal impairment involving multiple sites 6. Chronic kidney disease, unspecified CKD stage -Chronic, improving, subsequent visit - No acute gout flares since previous office visit with patient maintaining allopurinol 200 mg daily - Uric acid on 06/21/2024 was 6.3 which is within normal limits, however ideally we try to keep patients with gout flares <6.0.  GFR 35 on 07/04/2024 which appears to be patient's baseline based on past 2 years of trending.  With patient's past medical history of CKD, no recent acute gout flares, I do not feel that we should further increase allopurinol dose at this time.  Will maintain allopurinol 200 mg daily.  Patient can call for refill if needed - If patient experiences an acute flare of gout, would recommend he contact our office and we could prescribe a prednisone  course.  Do not recommend NSAIDs/colchicine  use due to chronic anticoagulation on Eliquis .    Pertinent previous records reviewed include lab work 07/04/2024, uric acid level 06/21/2024   Follow Up: 2 to 3 months for reevaluation.  Would recheck kidney function and uric acid     Subjective:   I, Nicholas Foley, am serving as a Neurosurgeon for Doctor Nicholas Foley  Chief Complaint: left elbow,knee, and bilat ankle    HPI:    06/21/2024 Patient is a 73 year old male with left elbow, knee, and bilat ankle pain. Patient states taking over the counter supplement called ferrous sulfate 325 mg once daily. Over the past three months right foot left foot left elbow then left knee. 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 Went to sleep one night and woke up with the pain. Did do pain medication and ice when it  started. Off this treatment now for about a week and a half. Hot to the touch if you could touch the areas at all. All areas had the same symptoms.    Duration? About 3 months Did you have an Injury to cause this pain?no Taking Medication for pain? no Numbness or Tingling?no Does the pain Radiate? no Altered gait or use? yes ROM/ impairment of movement?yes   07/30/2024 Patient states no gout flares is doing better     Relevant Historical Information: CKD, paroxysmal atrial fibrillation on chronic anticoagulation with Eliquis , hypertension, COPD, elevated BMI  Additional pertinent review of systems negative.   Current Outpatient Medications:    acetaminophen  (TYLENOL ) 650 MG CR tablet, Take 1,300 mg by mouth in the morning and at bedtime. Arthritis strength, Disp: , Rfl:    ADVAIR DISKUS 250-50 MCG/DOSE AEPB, Inhale 1 puff into the lungs 2 (two) times daily., Disp: , Rfl:    allopurinol (ZYLOPRIM) 100 MG tablet, Take 200 mg by mouth daily., Disp: , Rfl:    apixaban  (ELIQUIS ) 5 MG TABS tablet, Take 1 tablet (5 mg total) by mouth 2 (two) times daily., Disp: 180 tablet, Rfl: 1   carvedilol  (COREG ) 12.5 MG tablet, Take 1 tablet (12.5 mg total) by mouth 2 (two) times daily with a meal., Disp: 60 tablet, Rfl: 6   cholecalciferol (VITAMIN D3) 25 MCG (1000 UT) tablet, Take 1,000 Units by mouth 2 times daily at 12 noon and 4 pm., Disp: , Rfl:    CIALIS 20 MG tablet,  Take 20 mg by mouth daily as needed for erectile dysfunction. , Disp: , Rfl: 0   cyclobenzaprine (FLEXERIL) 10 MG tablet, Take 10 mg by mouth 3 (three) times daily., Disp: , Rfl:    empagliflozin  (JARDIANCE ) 10 MG TABS tablet, Take 1 tablet (10 mg total) by mouth daily before breakfast., Disp: 90 tablet, Rfl: 3   ferrous sulfate 325 (65 FE) MG tablet, Take 325 mg by mouth daily with breakfast., Disp: , Rfl:    fluticasone  (FLONASE ) 50 MCG/ACT nasal spray, Place 1 spray into both nostrils 2 (two) times daily. , Disp: , Rfl: 0    furosemide  (LASIX ) 80 MG tablet, Take 1 tablet (80 mg total) by mouth 2 (two) times daily., Disp: 360 tablet, Rfl: 3   gabapentin  (NEURONTIN ) 100 MG capsule, Take 2 capsules (200 mg total) by mouth at bedtime., Disp: 180 capsule, Rfl: 3   Multiple Vitamins-Minerals (PRESERVISION AREDS PO), Take 1 capsule by mouth 2 (two) times daily., Disp: , Rfl:    omeprazole (PRILOSEC OTC) 20 MG tablet, Take 20 mg by mouth daily., Disp: , Rfl:    sacubitril -valsartan  (ENTRESTO ) 24-26 MG, Take 1 tablet by mouth 2 (two) times daily., Disp: 180 tablet, Rfl: 3   spironolactone  (ALDACTONE ) 25 MG tablet, TAKE 1 TABLET(25 MG) BY MOUTH DAILY, Disp: 90 tablet, Rfl: 3   tamsulosin (FLOMAX) 0.4 MG CAPS capsule, Take 0.4 mg by mouth daily., Disp: , Rfl:    tirzepatide  (ZEPBOUND ) 12.5 MG/0.5ML Pen, Inject 12.5 mg into the skin once a week., Disp: 2 mL, Rfl: 0   vitamin B-12 (CYANOCOBALAMIN ) 1000 MCG tablet, Take 1,000 mcg by mouth daily., Disp: , Rfl:    zolpidem  (AMBIEN  CR) 12.5 MG CR tablet, Take 12.5 mg by mouth at bedtime as needed for sleep., Disp: , Rfl:    Objective:     Vitals:   07/30/24 1253  BP: 132/84  Pulse: 95  SpO2: 96%  Weight: 286 lb (129.7 kg)  Height: 5' 11 (1.803 m)      Body mass index is 39.89 kg/m.    Physical Exam:    General: Well-appearing, cooperative, sitting comfortably in no acute distress.  HEENT: Normocephalic, atraumatic.   Neck: No gross abnormality.  Cardiovascular: No pallor or cyanosis. Resp: Comfortable WOB.   Abdomen: Non distended.   Skin: Warm and dry; no focal rashes identified on limited exam. Extremities: No cyanosis or edema.  Neuro: Gross motor and sensory intact. Gait normal. Psychiatric: Mood and affect are appropriate.    Electronically signed by:  Nicholas Foley Foley Nicholas Foley 1:12 PM 07/30/24

## 2024-07-30 ENCOUNTER — Ambulatory Visit (INDEPENDENT_AMBULATORY_CARE_PROVIDER_SITE_OTHER): Admitting: Sports Medicine

## 2024-07-30 VITALS — BP 132/84 | HR 95 | Ht 71.0 in | Wt 286.0 lb

## 2024-07-30 DIAGNOSIS — M2550 Pain in unspecified joint: Secondary | ICD-10-CM | POA: Diagnosis not present

## 2024-07-30 DIAGNOSIS — N189 Chronic kidney disease, unspecified: Secondary | ICD-10-CM

## 2024-07-30 DIAGNOSIS — M25571 Pain in right ankle and joints of right foot: Secondary | ICD-10-CM | POA: Diagnosis not present

## 2024-07-30 DIAGNOSIS — M1039 Gout due to renal impairment, multiple sites: Secondary | ICD-10-CM

## 2024-07-30 DIAGNOSIS — M25572 Pain in left ankle and joints of left foot: Secondary | ICD-10-CM

## 2024-07-30 DIAGNOSIS — M25562 Pain in left knee: Secondary | ICD-10-CM | POA: Diagnosis not present

## 2024-07-30 DIAGNOSIS — M25522 Pain in left elbow: Secondary | ICD-10-CM | POA: Diagnosis not present

## 2024-07-30 NOTE — Patient Instructions (Signed)
 Continue allopurinol 200 mg daily   Call if you need a refill   If you have an acute flare of gout call and ask for a prednisone  pak  2-3 month follow up

## 2024-07-31 ENCOUNTER — Other Ambulatory Visit (HOSPITAL_COMMUNITY): Payer: Self-pay

## 2024-07-31 MED ORDER — ZEPBOUND 15 MG/0.5ML ~~LOC~~ SOAJ
15.0000 mg | SUBCUTANEOUS | 1 refills | Status: DC
  Filled 2024-07-31: qty 2, 28d supply, fill #0
  Filled 2024-08-30: qty 2, 28d supply, fill #1

## 2024-07-31 NOTE — Addendum Note (Signed)
 Addended by: Rula Keniston L on: 07/31/2024 09:36 AM   Modules accepted: Orders

## 2024-08-01 ENCOUNTER — Other Ambulatory Visit (HOSPITAL_COMMUNITY): Payer: Self-pay

## 2024-08-06 NOTE — Progress Notes (Signed)
 Remote PPM Transmission

## 2024-08-13 ENCOUNTER — Ambulatory Visit: Attending: Cardiology

## 2024-08-13 DIAGNOSIS — Z950 Presence of cardiac pacemaker: Secondary | ICD-10-CM

## 2024-08-13 DIAGNOSIS — I5022 Chronic systolic (congestive) heart failure: Secondary | ICD-10-CM | POA: Diagnosis not present

## 2024-08-13 NOTE — Progress Notes (Signed)
 EPIC Encounter for ICM Monitoring  Patient Name: Nicholas Foley is a 73 y.o. male Date: 08/13/2024 Primary Care Physican: Dyane Anthony RAMAN, FNP Primary Cardiologist: Smith/Bensimhon Electrophysiologist: Cindie Nephrologist: [facility]: Dr Macel Pore Pacing:  89.5% 10/26/2023 Office Weight: 301 lbs (highest weight 330 lbs) 01/30/2024 Weight: 295 lbs 04/09/2024 Weight: 302 lbs 06/08/2024 Weight: 290 lbs 07/10/2024 Weight: 285 lbs (total of 40 lb weight loss) 08/13/2024 Weight: 280 lbs     Since 04-Jul-2024 Time in AT/AF  <0.1 hr/day (<0.1%) Taking Eliquis  VT (>4 beats)  3         Spoke with patient and heart failure questions reviewed.  Transmission results reviewed.  Pt asymptomatic for fluid accumulation.  Reports feeling well at this time and voices no complaints.           Optivol thoracic impedance suggesting possible fluid accumulation starting 9/7.   Prescribed: Furosemide  80 mg take 1 tablet(s) (80 mg total) twice a day.  Pt reports 9/22 that Dr Cherrie said he could take an extra 80 mg if needed.  Potassium 20 mEq take 2 tablet(s) daily Spironolactone  25 mg take 1 tablet daily   Labs: 07/04/2024 Creatinine 2.00, BUN 28, Potassium 4.3, Sodium 139, GFR 35 02/15/2024 Creatinine 1.87, BUN 37, Potassium 4.3, Sodium 138, GFR 38 01/26/2024 Creatinine 1.67, BUN 38, Potassium 4.4, Sodium 138, GFR 44 01/18/2024 Creatinine 2.17, BUN 42, Potassium 5.4, Sodium 136, GFR 32 A complete set of results can be found in Results Review.   Recommendations:   Recommended to take Lasix  40 mg x 1 dose and then return to prescribed dosage.   Sent to Dr Cherrie as [place] and review.    Follow-up plan: ICM clinic phone appointment on 08/20/2024 to recheck fluid levels.   91 day device clinic remote transmission 10/17/2024.     EP/Cardiology Office Visits:   Recall 01/13/2025 with Dr Cherrie    Recall 12/31/2024 with Dr Cindie.      Copy of ICM check sent to Dr Cindie.    3 month  ICM trend: 08/13/2024.    12-14 Month ICM trend:     [name], RN 08/13/2024 1:39 PM

## 2024-08-14 ENCOUNTER — Other Ambulatory Visit (HOSPITAL_COMMUNITY): Payer: Self-pay | Admitting: Internal Medicine

## 2024-08-14 DIAGNOSIS — I5022 Chronic systolic (congestive) heart failure: Secondary | ICD-10-CM

## 2024-08-21 ENCOUNTER — Ambulatory Visit: Attending: Cardiology

## 2024-08-21 DIAGNOSIS — Z950 Presence of cardiac pacemaker: Secondary | ICD-10-CM

## 2024-08-21 DIAGNOSIS — I5022 Chronic systolic (congestive) heart failure: Secondary | ICD-10-CM

## 2024-08-21 NOTE — Progress Notes (Unsigned)
 EPIC Encounter for ICM Monitoring  Patient Name: Nicholas Foley is a 73 y.o. male Date: 08/21/2024 Primary Care Physican: Nicholas Anthony RAMAN, FNP Primary Cardiologist: Nicholas Foley Electrophysiologist: Nicholas Foley Nephrologist: [facility]: Nicholas Nicholas Foley Pacing:  84.0% 10/26/2023 Office Weight: 301 lbs (highest weight 330 lbs) 01/30/2024 Weight: 295 lbs 04/09/2024 Weight: 302 lbs 06/08/2024 Weight: 290 lbs 07/10/2024 Weight: 285 lbs (total of 40 lb weight loss) 08/13/2024 Weight: 280 lbs     Clinical Status (12-Aug-2024 to 20-Aug-2024) Time in AT/AF   0.5 hr/day (2.2%) Taking Eliquis  VT (>4 beats)  3         Spoke with patient and heart failure questions reviewed.  Transmission results reviewed.  Pt has been feeling like he is retaining fluid and currently both he and his wife have respiratory infection.           Since 08/13/2024 ICM Remote Transmission: Optivol thoracic impedance continues to suggest possible fluid accumulation starting 07/29/2024.   Prescribed: Furosemide  80 mg take 1 tablet(s) (80 mg total) twice a day.  Pt reports 08/13/2024 that Nicholas Bensimhon said he could take an extra 80 mg if needed.  Potassium 20 mEq take 2 tablet(s) daily Spironolactone  25 mg take 1 tablet daily   Labs: 07/04/2024 Creatinine 2.00, BUN 28, Potassium 4.3, Sodium 139, GFR 35 02/15/2024 Creatinine 1.87, BUN 37, Potassium 4.3, Sodium 138, GFR 38 01/26/2024 Creatinine 1.67, BUN 38, Potassium 4.4, Sodium 138, GFR 44 01/18/2024 Creatinine 2.17, BUN 42, Potassium 5.4, Sodium 136, GFR 32 A complete set of results can be found in Results Review.   Recommendations:  Pt self adjusts Furosemide  when needed.  He will take extra 40 mg today and tomorrow the resume prescribed dosage.  Copy sent to Nicholas Foley as Nicholas Foley.     Follow-up plan: ICM clinic phone appointment on 09/03/2024 to recheck fluid levels.   91 day device clinic remote transmission 10/17/2024.     EP/Cardiology Office Visits:    Recall 01/13/2025 with Nicholas Foley    Recall 12/31/2024 with Nicholas Foley.      Copy of ICM check sent to Nicholas Foley.    Remote monitoring is medically necessary for Heart Failure Management.    90 day Daily Thoracic Impedance ICM trend: 05/21/2024 through 08/20/2024.    12-14 Month Thoracic Impedance ICM trend:     Nicholas Foley [name], RN 08/21/2024 7:15 AM

## 2024-08-28 ENCOUNTER — Other Ambulatory Visit: Payer: Self-pay | Admitting: *Deleted

## 2024-08-28 ENCOUNTER — Other Ambulatory Visit (HOSPITAL_COMMUNITY): Payer: Self-pay | Admitting: Internal Medicine

## 2024-08-28 DIAGNOSIS — I5022 Chronic systolic (congestive) heart failure: Secondary | ICD-10-CM

## 2024-08-28 DIAGNOSIS — I480 Paroxysmal atrial fibrillation: Secondary | ICD-10-CM

## 2024-08-28 MED ORDER — APIXABAN 5 MG PO TABS: 5.0000 mg | ORAL_TABLET | Freq: Two times a day (BID) | ORAL | 1 refills | Status: DC

## 2024-08-28 NOTE — Telephone Encounter (Signed)
 Eliquis  5mg  refill request received. Patient is 73 years old, weight-129.7kg, Crea-2.00 on 07/04/24, Diagnosis-Afib, and last seen by Dr. Cherrie on 07/17/24. Dose is appropriate based on dosing criteria. Will send in refill to requested pharmacy.

## 2024-08-30 ENCOUNTER — Other Ambulatory Visit

## 2024-09-03 ENCOUNTER — Ambulatory Visit: Attending: Cardiology

## 2024-09-03 ENCOUNTER — Ambulatory Visit (HOSPITAL_COMMUNITY)
Admission: RE | Admit: 2024-09-03 | Discharge: 2024-09-03 | Disposition: A | Discharge status: Home / Self Care | Source: Ambulatory Visit | Attending: Internal Medicine | Admitting: Internal Medicine

## 2024-09-03 DIAGNOSIS — I5022 Chronic systolic (congestive) heart failure: Secondary | ICD-10-CM | POA: Insufficient documentation

## 2024-09-03 DIAGNOSIS — I110 Hypertensive heart disease with heart failure: Secondary | ICD-10-CM | POA: Insufficient documentation

## 2024-09-03 DIAGNOSIS — I77810 Thoracic aortic ectasia: Secondary | ICD-10-CM | POA: Insufficient documentation

## 2024-09-03 DIAGNOSIS — Z950 Presence of cardiac pacemaker: Secondary | ICD-10-CM

## 2024-09-03 LAB — ECHOCARDIOGRAM COMPLETE
Area-P 1/2: 5.66 cm2
Calc EF: 50.3 %
S' Lateral: 4.1 cm
Single Plane A2C EF: 51.6 %
Single Plane A4C EF: 47.9 %

## 2024-09-04 MED ORDER — METOLAZONE 2.5 MG PO TABS: 2.5000 mg | ORAL_TABLET | Freq: Every day | ORAL | 0 refills | Status: DC

## 2024-09-04 MED ORDER — POTASSIUM CHLORIDE CRYS ER 20 MEQ PO TBCR: EXTENDED_RELEASE_TABLET | ORAL | 0 refills | Status: DC

## 2024-09-04 NOTE — Progress Notes (Signed)
  Received: Today Milford, Harlene HERO, FNP  Cheyenne Bordeaux, Mitzie RAMAN, RN Take metolazone  2.5 mg + 40 KCL x 1 dose. Will need a BMET in 1 week

## 2024-09-04 NOTE — Progress Notes (Signed)
 EPIC Encounter for ICM Monitoring  Patient Name: Nicholas Foley is a 73 y.o. male Date: 09/04/2024 Primary Care Physican: Nicholas Anthony RAMAN, FNP Primary Cardiologist: Nicholas Foley Electrophysiologist: Nicholas Foley Nephrologist: Nicholas [facility]: Dr Nicholas Foley Pacing:  91% 10/26/2023 Office Weight: 301 lbs (weight 319 lbs in 2022 & 2023) 01/30/2024 Weight: 295 lbs 04/09/2024 Weight: 302 lbs 06/08/2024 Weight: 290 lbs 07/10/2024 Weight: 285 lbs (total of 40 lb weight loss) 08/13/2024 Weight: 280 lbs  09/04/2024 Weight: 280 lbs    Clinical Status Since 20-Aug-2024 Time in AT/AF  <0.1 hr/day (<0.1%) Taking Eliquis          Spoke with patient and heart failure questions reviewed.  Transmission results reviewed.  Pt asymptomatic for fluid accumulation.  Reports feeling well at this time and  does not feel like he has any fluid.  He did take extra Lasix  2 weeks ago which improved thoracic impedance for a few days.   He has weight loss over the last year of about          Since 08/21/2024 ICM Remote Transmission: Optivol thoracic impedance continues to suggest possible fluid accumulation continues to ongoing since 07/29/2024.  Fluid index > normal index starting 08/10/2024.   Prescribed: Furosemide  80 mg take 1 tablet(s) (80 mg total) twice a day.  Pt reports 08/13/2024 that Dr Bensimhon said he could take an extra 80 mg if needed.  Spironolactone  25 mg take 1 tablet daily   Labs: 07/04/2024 Creatinine 2.00, BUN 28, Potassium 4.3, Sodium 139, GFR 35 02/15/2024 Creatinine 1.87, BUN 37, Potassium 4.3, Sodium 138, GFR 38 01/26/2024 Creatinine 1.67, BUN 38, Potassium 4.4, Sodium 138, GFR 44 01/18/2024 Creatinine 2.17, BUN 42, Potassium 5.4, Sodium 136, GFR 32 A complete set of results can be found in Results Review.   Recommendations:  Copy sent to Nicholas Gainer, NP for review and recommendations.  Pt plans on taking extra Lasix  80 mg today and will wait for any further recommendations if  needed.      Follow-up plan: ICM clinic phone appointment on 09/17/2024 for 31 day & to recheck fluid levels.   91 day device clinic remote transmission 10/17/2024.     EP/Cardiology Office Visits:   Recall 01/13/2025 with Dr Cherrie    Recall 12/31/2024 with Dr Nicholas Foley.      Copy of ICM check sent to Dr Nicholas Foley.    Remote monitoring is medically necessary for Heart Failure Management.    Daily Thoracic Impedance ICM trend: 06/03/2024 through 09/02/2024.    12-14 Month Thoracic Impedance ICM trend:     Nicholas Foley [name], RN 09/04/2024 2:30 PM

## 2024-09-04 NOTE — Progress Notes (Signed)
 Spoke with patient and advised Harlene Gainer, NP at HF clinic recommended to take Metolazone  2.5 mg 1 tablet with 2 tablets of Potassium 20 mEq =40 mEq tomorrow morning 30 minutes prior to taking Furosemide  morning dose.  Advised not to take any extra Furosemide  this week since he will be taking Metolazone  tomorrow.  He has taken a dose of Metolazone  in the past.    BMET scheduled for 09/12/2024 at HF Clinic  Pt verbalized understanding and agreed with plan.  Epic med list updated and sent to preferred pharmacy.    Rescheduled ICM remote transmission for

## 2024-09-07 ENCOUNTER — Ambulatory Visit
Admission: RE | Admit: 2024-09-07 | Discharge: 2024-09-07 | Disposition: A | Source: Ambulatory Visit | Attending: Acute Care | Admitting: Acute Care

## 2024-09-07 DIAGNOSIS — Z87891 Personal history of nicotine dependence: Secondary | ICD-10-CM

## 2024-09-07 DIAGNOSIS — R918 Other nonspecific abnormal finding of lung field: Secondary | ICD-10-CM

## 2024-09-10 ENCOUNTER — Ambulatory Visit: Attending: Cardiology

## 2024-09-10 DIAGNOSIS — Z950 Presence of cardiac pacemaker: Secondary | ICD-10-CM

## 2024-09-10 DIAGNOSIS — I5022 Chronic systolic (congestive) heart failure: Secondary | ICD-10-CM

## 2024-09-11 NOTE — Progress Notes (Signed)
 EPIC Encounter for ICM Monitoring  Patient Name: Nicholas Foley is a 73 y.o. male Date: 09/11/2024 Primary Care Physican: Dyane Anthony RAMAN, FNP Primary Cardiologist: Smith/Bensimhon Electrophysiologist: Cindie Nephrologist: [facility]: Dr Macel Pore Pacing:  90.4% 10/26/2023 Office Weight: 301 lbs (weight 319 lbs in 2022 & 2023) 01/30/2024 Weight: 295 lbs 04/09/2024 Weight: 302 lbs 06/08/2024 Weight: 290 lbs 07/10/2024 Weight: 285 lbs (total of 40 lb weight loss) 08/13/2024 Weight: 280 lbs  09/04/2024 Weight: 280 lbs 09/11/2024 Weight: 277 lbs    Clinical Status Since 02-Sep-2024 Time in AT/AF  <0.1 hr/day (<0.1%) Taking Eliquis          Spoke with patient and heart failure questions reviewed.  Transmission results reviewed.  Pt asymptomatic for fluid accumulation.  He lost 3 pounds after taking Metolazone .   He did receive a flu and Covid shot last week and has not felt well since then.  He has labs scheduled for tomorrow.          Since 09/03/2024 ICM Remote Transmission: Optivol thoracic impedance continues to suggest fluid levels returned to normal after taking Metolazone  x 1 dose.   Prescribed: Furosemide  80 mg take 1 tablet(s) (80 mg total) twice a day.  Pt reports 08/13/2024 that Dr Bensimhon said he could take an extra 80 mg if needed.  Spironolactone  25 mg take 1 tablet daily   Labs: 09/12/2024 BMET scheduled at HF clinic 07/04/2024 Creatinine 2.00, BUN 28, Potassium 4.3, Sodium 139, GFR 35 02/15/2024 Creatinine 1.87, BUN 37, Potassium 4.3, Sodium 138, GFR 38 01/26/2024 Creatinine 1.67, BUN 38, Potassium 4.4, Sodium 138, GFR 44 01/18/2024 Creatinine 2.17, BUN 42, Potassium 5.4, Sodium 136, GFR 32 A complete set of results can be found in Results Review.   Recommendations:  No changes and encouraged to call if experiencing any fluid symptoms.   Follow-up plan: ICM clinic phone appointment on 09/24/2024.   91 day device clinic remote transmission 10/17/2024.      EP/Cardiology Office Visits:   Recall 01/13/2025 with Dr Cherrie    Recall 12/31/2024 with Dr Cindie.      Copy of ICM check sent to Dr Cindie.    Remote monitoring is medically necessary for Heart Failure Management.    Daily Thoracic Impedance ICM trend: 06/11/2024 through 09/10/2024.    12-14 Month Thoracic Impedance ICM trend:     [name], RN 09/11/2024 9:42 AM

## 2024-09-12 ENCOUNTER — Telehealth: Payer: Self-pay | Admitting: Acute Care

## 2024-09-12 ENCOUNTER — Ambulatory Visit (HOSPITAL_COMMUNITY): Payer: Self-pay | Admitting: Family Medicine

## 2024-09-12 ENCOUNTER — Ambulatory Visit (HOSPITAL_COMMUNITY)
Admission: RE | Admit: 2024-09-12 | Discharge: 2024-09-12 | Disposition: A | Discharge status: Home / Self Care | Source: Ambulatory Visit | Attending: Cardiology | Admitting: Cardiology

## 2024-09-12 DIAGNOSIS — Z87891 Personal history of nicotine dependence: Secondary | ICD-10-CM

## 2024-09-12 DIAGNOSIS — Z950 Presence of cardiac pacemaker: Secondary | ICD-10-CM | POA: Insufficient documentation

## 2024-09-12 DIAGNOSIS — I5022 Chronic systolic (congestive) heart failure: Secondary | ICD-10-CM | POA: Insufficient documentation

## 2024-09-12 DIAGNOSIS — R911 Solitary pulmonary nodule: Secondary | ICD-10-CM

## 2024-09-12 LAB — BASIC METABOLIC PANEL WITH GFR
Anion gap: 15 (ref 5–15)
BUN: 36 mg/dL — ABNORMAL HIGH (ref 8–23)
CO2: 24 mmol/L (ref 22–32)
Calcium: 9 mg/dL (ref 8.9–10.3)
Chloride: 97 mmol/L — ABNORMAL LOW (ref 98–111)
Creatinine, Ser: 1.95 mg/dL — ABNORMAL HIGH (ref 0.61–1.24)
GFR, Estimated: 36 mL/min — ABNORMAL LOW (ref >60)
Glucose, Bld: 105 mg/dL — ABNORMAL HIGH (ref 70–99)
Potassium: 3.6 mmol/L (ref 3.5–5.1)
Sodium: 136 mmol/L (ref 135–145)

## 2024-09-12 NOTE — Telephone Encounter (Signed)
 Called patient to review results. LVM.

## 2024-09-12 NOTE — Telephone Encounter (Signed)
 Results reviewed by Lauraine Lites, NP with the following recommendations:  LR2 with new GG nodule 1.6cm.  If patient has been sick, follow up for care with PCP.  A follow up LDCT is recommended at 6 months due to new GG nodule.  This was a follow up scan with previous nodules of interest resolved.  Please fax results/plan to PCP and place follow up LDCT order.

## 2024-09-14 NOTE — Telephone Encounter (Signed)
LVM to review results.

## 2024-09-17 ENCOUNTER — Ambulatory Visit

## 2024-09-17 NOTE — Telephone Encounter (Signed)
 Spoke with patient by phone using two patient identifiers.  Reviewed results of recent LDCT.  Previous nodules of interest have resolved.  New GG nodule with recommendation to follow up CT in 6 months, per pulmonary provider Lauraine Lites, NP.  Patient denies any recent illness, fever, increased congestion or shortness of breath.  States he rarely has seasonal allergies and has not noticed any recent signs of illness.  He had a 'viral illness' a few months ago but described it as brief and no lingering cough or congestion. Had very little cough or increased congestion with the virus.  Patient is agreeable to repeat LDCT in 6 months.  Results/Plan faxed to PCP and new order placed for follow up LDCT.

## 2024-09-17 NOTE — Addendum Note (Signed)
 Addended by: DIONISIO AQUAS on: 09/17/2024 10:48 AM   Modules accepted: Orders

## 2024-09-19 ENCOUNTER — Encounter (INDEPENDENT_AMBULATORY_CARE_PROVIDER_SITE_OTHER): Payer: Medicare Other | Admitting: Ophthalmology

## 2024-09-24 ENCOUNTER — Ambulatory Visit: Attending: Cardiology

## 2024-09-24 DIAGNOSIS — I5022 Chronic systolic (congestive) heart failure: Secondary | ICD-10-CM

## 2024-09-24 DIAGNOSIS — Z950 Presence of cardiac pacemaker: Secondary | ICD-10-CM | POA: Diagnosis not present

## 2024-09-24 MED ORDER — ZEPBOUND 15 MG/0.5ML ~~LOC~~ SOAJ: 15.0000 mg | SUBCUTANEOUS | 2 refills | Status: DC

## 2024-09-24 NOTE — Progress Notes (Signed)
 EPIC Encounter for ICM Monitoring  Patient Name: Nicholas Foley is a 73 y.o. male Date: 09/24/2024 Primary Care Physican: Dyane Anthony RAMAN, FNP Primary Cardiologist: Smith/Bensimhon Electrophysiologist: Cindie Nephrologist: [facility]: Dr Macel Pore Pacing:  91.6% 10/26/2023 Office Weight: 301 lbs (weight 319 lbs in 2022 & 2023) 01/30/2024 Weight: 295 lbs 04/09/2024 Weight: 302 lbs 06/08/2024 Weight: 290 lbs 07/10/2024 Weight: 285 lbs (total of 40 lb weight loss) 08/13/2024 Weight: 280 lbs  09/04/2024 Weight: 280 lbs 09/11/2024 Weight: 277 lbs 09/23/2024 Weight: 274 lbs    Clinical Status Since 10-Sep-2024 Time in AT/AF  <0.1 hr/day (<0.1%) Taking Eliquis          Spoke with patient and heart failure questions reviewed.  Transmission results reviewed.  Pt asymptomatic for fluid accumulation.  He has been strict on salt and fluid intake and unsure what is causing fluid accumulation.  He did not feel well after taking the last Covid vaccine a couple of weeks ago and just now feeling better.         Since 09/10/2024 ICM Remote Transmission: Optivol thoracic impedance continues to suggesting possible fluid accumulation starting 09/12/2024.   Prescribed: Furosemide  80 mg take 1 tablet(s) (80 mg total) twice a day.  Pt reports 08/13/2024 that Dr Bensimhon said he could take an extra 80 mg if needed.  Spironolactone  25 mg take 1 tablet daily   Labs: 09/12/2024 Creatinine 1.95, BUN 36, Potassium 3.6, Sodium 136, GFR 36 07/04/2024 Creatinine 2.00, BUN 28, Potassium 4.3, Sodium 139, GFR 35 02/15/2024 Creatinine 1.87, BUN 37, Potassium 4.3, Sodium 138, GFR 38 01/26/2024 Creatinine 1.67, BUN 38, Potassium 4.4, Sodium 138, GFR 44 01/18/2024 Creatinine 2.17, BUN 42, Potassium 5.4, Sodium 136, GFR 32 A complete set of results can be found in Results Review.   Recommendation:  He will take extra 40 mg of Lasix  x 3 doses with his prescribed 80 mg which he reports Dr Bensimhon as approved  for him to do.  Copy to Dr Cherrie for review of plan.     Follow-up plan: ICM clinic phone appointment on 10/01/2024 to recheck fluid levels.   91 day device clinic remote transmission 10/17/2024.     EP/Cardiology Office Visits:   Recall 01/13/2025 with Dr Cherrie    Recall 12/31/2024 with Dr Cindie.      Copy of ICM check sent to Dr Cindie.     Remote monitoring is medically necessary for Heart Failure Management.    Daily Thoracic Impedance ICM trend: 06/25/2024 through 09/24/2024.    12-14 Month Thoracic Impedance ICM trend:     [name], RN 09/24/2024 1:42 PM

## 2024-09-24 NOTE — Addendum Note (Signed)
 Addended by: Iasia Forcier L on: 09/24/2024 01:18 PM   Modules accepted: Orders

## 2024-09-24 NOTE — Progress Notes (Deleted)
 [facility] [address] [place] [address] Phone: [phone]   Assessment and Plan:     ***    Pertinent previous records reviewed include ***   Follow Up: ***     Subjective:   I, Nicholas Foley, am serving as a neurosurgeon for Doctor Nicholas Foley   Chief Complaint: left elbow,knee, and bilat ankle    HPI:    06/21/2024 Patient is a 73 year old male with left elbow, knee, and bilat ankle pain. Patient states taking over the counter supplement called ferrous sulfate 325 mg once daily. Over the past three months right foot left foot left elbow then left knee. 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 Went to sleep one night and woke up with the pain. Did do pain medication and ice when it started. Off this treatment now for about a week and a half. Hot to the touch if you could touch the areas at all. All areas had the same symptoms.    Duration? About 3 months Did you have an Injury to cause this pain?no Taking Medication for pain? no Numbness or Tingling?no Does the pain Radiate? no Altered gait or use? yes ROM/ impairment of movement?yes   07/30/2024 Patient states no gout flares is doing better    10/01/2024 Patient states   Relevant Historical Information: CKD, paroxysmal atrial fibrillation on chronic anticoagulation with Eliquis , hypertension, COPD, elevated BMI  Additional pertinent review of systems negative.   Current Outpatient Medications:    acetaminophen  (TYLENOL ) 650 MG CR tablet, Take 1,300 mg by mouth in the morning and at bedtime. Arthritis strength, Disp: , Rfl:    ADVAIR DISKUS 250-50 MCG/DOSE AEPB, Inhale 1 puff into the lungs 2 (two) times daily., Disp: , Rfl:    allopurinol (ZYLOPRIM) 100 MG tablet, Take 200 mg by mouth daily., Disp: , Rfl:    apixaban  (ELIQUIS ) 5 MG TABS tablet, Take 1 tablet (5 mg total) by mouth 2 (two) times daily., Disp: 180 tablet, Rfl: 1    carvedilol  (COREG ) 12.5 MG tablet, Take 1 tablet (12.5 mg total) by mouth 2 (two) times daily with a meal., Disp: 60 tablet, Rfl: 6   cholecalciferol (VITAMIN D3) 25 MCG (1000 UT) tablet, Take 1,000 Units by mouth 2 times daily at 12 noon and 4 pm., Disp: , Rfl:    CIALIS 20 MG tablet, Take 20 mg by mouth daily as needed for erectile dysfunction. , Disp: , Rfl: 0   cyclobenzaprine (FLEXERIL) 10 MG tablet, Take 10 mg by mouth 3 (three) times daily., Disp: , Rfl:    empagliflozin  (JARDIANCE ) 10 MG TABS tablet, Take 1 tablet (10 mg total) by mouth daily before breakfast., Disp: 90 tablet, Rfl: 3   ferrous sulfate 325 (65 FE) MG tablet, Take 325 mg by mouth daily with breakfast., Disp: , Rfl:    fluticasone  (FLONASE ) 50 MCG/ACT nasal spray, Place 1 spray into both nostrils 2 (two) times daily. , Disp: , Rfl: 0   furosemide  (LASIX ) 80 MG tablet, Take 1 tablet (80 mg total) by mouth 2 (two) times daily., Disp: 360 tablet, Rfl: 3   gabapentin  (NEURONTIN ) 100 MG capsule, Take 2 capsules (200 mg total) by mouth at bedtime., Disp: 180 capsule, Rfl: 3   metolazone  (ZAROXOLYN ) 2.5 MG tablet, Take 1 tablet (2.5 mg total) by mouth daily for 1 dose. Take with 40 mEq of Potassium 30 minutes prior to taking Furosemide  morning dosage., Disp: 1 tablet,  Rfl: 0   Multiple Vitamins-Minerals (PRESERVISION AREDS PO), Take 1 capsule by mouth 2 (two) times daily., Disp: , Rfl:    omeprazole (PRILOSEC OTC) 20 MG tablet, Take 20 mg by mouth daily., Disp: , Rfl:    potassium chloride  SA (KLOR-CON  M20) 20 MEQ tablet, Take 2 tablets (40 mEq total) with 1 dose of Metolazone ., Disp: 2 tablet, Rfl: 0   sacubitril -valsartan  (ENTRESTO ) 24-26 MG, Take 1 tablet by mouth 2 (two) times daily., Disp: 180 tablet, Rfl: 3   spironolactone  (ALDACTONE ) 25 MG tablet, TAKE 1 TABLET(25 MG) BY MOUTH DAILY, Disp: 90 tablet, Rfl: 3   tamsulosin (FLOMAX) 0.4 MG CAPS capsule, Take 0.4 mg by mouth daily., Disp: , Rfl:    tirzepatide  (ZEPBOUND ) 15  MG/0.5ML Pen, Inject 15 mg into the skin once a week., Disp: 2 mL, Rfl: 1   vitamin B-12 (CYANOCOBALAMIN ) 1000 MCG tablet, Take 1,000 mcg by mouth daily., Disp: , Rfl:    zolpidem  (AMBIEN  CR) 12.5 MG CR tablet, Take 12.5 mg by mouth at bedtime as needed for sleep., Disp: , Rfl:    Objective:     There were no vitals filed for this visit.    There is no height or weight on file to calculate BMI.    Physical Exam:    ***   Electronically signed by:  Nicholas Foley [facility] 7:40 AM 09/24/24

## 2024-09-25 ENCOUNTER — Other Ambulatory Visit: Payer: Self-pay | Admitting: Neurology

## 2024-09-26 ENCOUNTER — Encounter (INDEPENDENT_AMBULATORY_CARE_PROVIDER_SITE_OTHER): Admitting: Ophthalmology

## 2024-09-26 DIAGNOSIS — H33302 Unspecified retinal break, left eye: Secondary | ICD-10-CM

## 2024-09-26 DIAGNOSIS — H353122 Nonexudative age-related macular degeneration, left eye, intermediate dry stage: Secondary | ICD-10-CM | POA: Diagnosis not present

## 2024-09-26 DIAGNOSIS — H43813 Vitreous degeneration, bilateral: Secondary | ICD-10-CM

## 2024-09-26 DIAGNOSIS — I10 Essential (primary) hypertension: Secondary | ICD-10-CM | POA: Diagnosis not present

## 2024-09-26 DIAGNOSIS — H35033 Hypertensive retinopathy, bilateral: Secondary | ICD-10-CM | POA: Diagnosis not present

## 2024-10-01 ENCOUNTER — Ambulatory Visit: Admitting: Sports Medicine

## 2024-10-01 ENCOUNTER — Ambulatory Visit: Attending: Cardiology

## 2024-10-01 ENCOUNTER — Telehealth: Payer: Self-pay

## 2024-10-01 DIAGNOSIS — I5022 Chronic systolic (congestive) heart failure: Secondary | ICD-10-CM

## 2024-10-01 DIAGNOSIS — Z950 Presence of cardiac pacemaker: Secondary | ICD-10-CM

## 2024-10-01 NOTE — Progress Notes (Signed)
  Received: Today Milford, Harlene HERO, FNP  Pilar Corrales, Mitzie RAMAN, RN Not convinced his OptiVol is accurate, in setting of weight loss. Echo on 09/03/24 showed normal IVC and device interrogation from that day shows volume up. Recommend OptiVol reset before we make anymore diuretic changes.

## 2024-10-01 NOTE — Telephone Encounter (Signed)
 Remote ICM transmission received.  Attempted call to patient regarding ICM remote transmission and no answer.

## 2024-10-01 NOTE — Progress Notes (Signed)
 Spoke with patient and advised of Jessica Milford's recommendation.  Explained Nicholas Foley device clinic nurse can reset the Optivol anytime on Thursday, 10/04/2024.  He said he will be there between 9-9:30 AM and advised to ask front desk check in for [employer].  He verbalized understanding.

## 2024-10-01 NOTE — Progress Notes (Signed)
 EPIC Encounter for ICM Monitoring  Patient Name: Nicholas Foley is a 73 y.o. male Date: 10/01/2024 Primary Care Physican: Dyane Anthony RAMAN, FNP Primary Cardiologist: Smith/Bensimhon Electrophysiologist: Cindie Nephrologist: [facility]: Dr Macel Pore Pacing:  91.3% 10/26/2023 Office Weight: 301 lbs (weight 319 lbs in 2022 & 2023) 01/30/2024 Weight: 295 lbs 04/09/2024 Weight: 302 lbs 06/08/2024 Weight: 290 lbs 07/10/2024 Weight: 285 lbs (total of 40 lb weight loss) 08/13/2024 Weight: 280 lbs  09/04/2024 Weight: 280 lbs 09/11/2024 Weight: 277 lbs 09/23/2024 Weight: 274 lbs 10/01/2024 Weight: 272 lbs    Clinical Status Since 24-Sep-2024 Time in AT/AF  <0.1 hr/day (<0.1%) Taking Eliquis          Spoke with patient and heart failure questions reviewed.  Transmission results reviewed.  Pt asymptomatic for fluid accumulation.  Weight decreased 2 lbs after taking extra Lasix .  He does not feel like he has any fluid.           Since 09/10/2024 ICM Remote Transmission: Optivol thoracic impedance continues to suggesting ongoing possible fluid accumulation starting 09/12/2024 despite taking extra 40 mg tablet x 3 days and an extra 80 mg x 1.      Prescribed: Furosemide  80 mg take 1 tablet(s) (80 mg total) twice a day.  Pt reports 08/13/2024 that Dr Bensimhon said he could take an extra 80 mg if needed.  Spironolactone  25 mg take 1 tablet daily Metolazone  2.5 mg 1 time dose 09/04/2024   Labs: 09/12/2024 Creatinine 1.95, BUN 36, Potassium 3.6, Sodium 136, GFR 36 07/04/2024 Creatinine 2.00, BUN 28, Potassium 4.3, Sodium 139, GFR 35 02/15/2024 Creatinine 1.87, BUN 37, Potassium 4.3, Sodium 138, GFR 38 01/26/2024 Creatinine 1.67, BUN 38, Potassium 4.4, Sodium 138, GFR 44 01/18/2024 Creatinine 2.17, BUN 42, Potassium 5.4, Sodium 136, GFR 32 A complete set of results can be found in Results Review.   Recommendation:   Copy sent to Harlene Gainer, NP for review.   Pt also would like to  get the result of hi Echo that was done in October.  He asked if he should make an appointment.     Follow-up plan: ICM clinic phone appointment on 10/17/2024 to recheck fluid levels.   91 day device clinic remote transmission 10/17/2024.     EP/Cardiology Office Visits:   Recall 01/13/2025 with Dr Cherrie    Recall 12/31/2024 with Dr Cindie.      Copy of ICM check sent to Dr Cindie.      Remote monitoring is medically necessary for Heart Failure Management.    Daily Thoracic Impedance ICM trend: 07/02/2024 through 10/01/2024.    12-14 Month Thoracic Impedance ICM trend:     [name], RN 10/01/2024 12:39 PM

## 2024-10-01 NOTE — Progress Notes (Signed)
 Attempted return call to patient. Left voice mail message

## 2024-10-04 ENCOUNTER — Telehealth: Payer: Self-pay | Admitting: *Deleted

## 2024-10-04 NOTE — Telephone Encounter (Signed)
 Patient seen in clinic today to reset Optivol   Optivol impedance reset and information saved to [employer]  No device testing performed today  No charge for visit  Patient will follow up as scheduled

## 2024-10-09 NOTE — Progress Notes (Unsigned)
 [facility] [address] [place] [address] Phone: [phone]   Assessment and Plan:     1. Acute bilateral ankle pain (Primary) 2. Acute gout due to renal impairment involving multiple sites 3. Chronic kidney disease, unspecified CKD stage -Chronic, stable, subsequent visit - No acute gout flares since previous office visit.  Patient appears to be well-controlled with allopurinol 71 mg daily, tart cherry extract. - Uric acid on 06/21/2024 was 6.3 which is within normal limits, however ideally we try to keep patients with gout flares <6.0.  GFR 35 on 07/04/2024 which appears to be patient's baseline based on past 2 years of trending.  With patient's past medical history of CKD, no recent acute gout flares, I do not feel that we should further increase allopurinol dose at this time.  Will maintain allopurinol 200 mg daily.  Patient can call for refill if needed.  Will recheck kidney function and uric acid at today's visit - If patient experiences an acute flare of gout, would recommend he contact our office and we could prescribe a prednisone  course.  Do not recommend NSAIDs/colchicine  use due to chronic anticoagulation on Eliquis .    Pertinent previous records reviewed include none   Follow Up: As needed for acute gout flare.  If BMP or uric acid levels are abnormal and require medication alteration, I will contact patient.   Subjective:   I, Claretha Schimke am a scribe for Dr. Leonce.   Chief Complaint: left elbow,knee, and bilat ankle    HPI:    06/21/2024 Patient is a 73 year old male with left elbow, knee, and bilat ankle pain. Patient states taking over the counter supplement called ferrous sulfate 325 mg once daily. Over the past three months right foot left foot left elbow then left knee. 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 Went to sleep one night and woke up with the pain. Did do pain  medication and ice when it started. Off this treatment now for about a week and a half. Hot to the touch if you could touch the areas at all. All areas had the same symptoms.    Duration? About 3 months Did you have an Injury to cause this pain?no Taking Medication for pain? no Numbness or Tingling?no Does the pain Radiate? no Altered gait or use? yes ROM/ impairment of movement?yes   07/30/2024 Patient states no gout flares is doing better    10/10/2024 Patient states that he is feeling good today. No gout issues since the last visit.    Relevant Historical Information: CKD, paroxysmal atrial fibrillation on chronic anticoagulation with Eliquis , hypertension, COPD, elevated BMI  Additional pertinent review of systems negative.   Current Outpatient Medications:    acetaminophen  (TYLENOL ) 650 MG CR tablet, Take 1,300 mg by mouth in the morning and at bedtime. Arthritis strength, Disp: , Rfl:    ADVAIR DISKUS 250-50 MCG/DOSE AEPB, Inhale 1 puff into the lungs 2 (two) times daily., Disp: , Rfl:    allopurinol (ZYLOPRIM) 100 MG tablet, Take 200 mg by mouth daily., Disp: , Rfl:    apixaban  (ELIQUIS ) 5 MG TABS tablet, Take 1 tablet (5 mg total) by mouth 2 (two) times daily., Disp: 180 tablet, Rfl: 1   carvedilol  (COREG ) 12.5 MG tablet, Take 1 tablet (12.5 mg total) by mouth 2 (two) times daily with a meal., Disp: 60 tablet, Rfl: 6   cholecalciferol (VITAMIN D3) 25 MCG (1000 UT) tablet, Take 1,000  Units by mouth 2 times daily at 12 noon and 4 pm., Disp: , Rfl:    CIALIS 20 MG tablet, Take 20 mg by mouth daily as needed for erectile dysfunction. , Disp: , Rfl: 0   cyclobenzaprine (FLEXERIL) 10 MG tablet, Take 10 mg by mouth 3 (three) times daily., Disp: , Rfl:    empagliflozin  (JARDIANCE ) 10 MG TABS tablet, Take 1 tablet (10 mg total) by mouth daily before breakfast., Disp: 90 tablet, Rfl: 3   ferrous sulfate 325 (65 FE) MG tablet, Take 325 mg by mouth daily with breakfast., Disp: , Rfl:     fluticasone  (FLONASE ) 50 MCG/ACT nasal spray, Place 1 spray into both nostrils 2 (two) times daily. , Disp: , Rfl: 0   furosemide  (LASIX ) 80 MG tablet, Take 1 tablet (80 mg total) by mouth 2 (two) times daily., Disp: 360 tablet, Rfl: 3   gabapentin  (NEURONTIN ) 100 MG capsule, Take 2 capsules (200 mg total) by mouth at bedtime., Disp: 180 capsule, Rfl: 3   metolazone  (ZAROXOLYN ) 2.5 MG tablet, Take 1 tablet (2.5 mg total) by mouth daily for 1 dose. Take with 40 mEq of Potassium 30 minutes prior to taking Furosemide  morning dosage., Disp: 1 tablet, Rfl: 0   Multiple Vitamins-Minerals (PRESERVISION AREDS PO), Take 1 capsule by mouth 2 (two) times daily., Disp: , Rfl:    omeprazole (PRILOSEC OTC) 20 MG tablet, Take 20 mg by mouth daily., Disp: , Rfl:    potassium chloride  SA (KLOR-CON  M20) 20 MEQ tablet, Take 2 tablets (40 mEq total) with 1 dose of Metolazone ., Disp: 2 tablet, Rfl: 0   sacubitril -valsartan  (ENTRESTO ) 24-26 MG, Take 1 tablet by mouth 2 (two) times daily., Disp: 180 tablet, Rfl: 3   spironolactone  (ALDACTONE ) 25 MG tablet, TAKE 1 TABLET(25 MG) BY MOUTH DAILY, Disp: 90 tablet, Rfl: 3   tamsulosin (FLOMAX) 0.4 MG CAPS capsule, Take 0.4 mg by mouth daily., Disp: , Rfl:    tirzepatide  (ZEPBOUND ) 15 MG/0.5ML Pen, Inject 15 mg into the skin once a week., Disp: 2 mL, Rfl: 2   vitamin B-12 (CYANOCOBALAMIN ) 1000 MCG tablet, Take 1,000 mcg by mouth daily., Disp: , Rfl:    zolpidem  (AMBIEN  CR) 12.5 MG CR tablet, Take 12.5 mg by mouth at bedtime as needed for sleep., Disp: , Rfl:    Objective:     Vitals:   10/10/24 1019  BP: 118/70  Pulse: 91  SpO2: 98%  Weight: 276 lb (125.2 kg)  Height: 5' 11 (1.803 m)      Body mass index is 38.49 kg/m.    Physical Exam:    General: Well-appearing, cooperative, sitting comfortably in no acute distress.  HEENT: Normocephalic, atraumatic.   Neck: No gross abnormality.  Cardiovascular: No pallor or cyanosis. Resp: Comfortable WOB.   Abdomen: Non  distended.   Skin: Warm and dry; no focal rashes identified on limited exam. Extremities: No cyanosis or edema.  Neuro: Gross motor and sensory intact. Gait normal. Psychiatric: Mood and affect are appropriate.    Electronically signed by:  Odis Mace D.CLEMENTEEN [facility] 10:35 AM 10/10/24

## 2024-10-10 ENCOUNTER — Ambulatory Visit: Admitting: Sports Medicine

## 2024-10-10 ENCOUNTER — Ambulatory Visit: Payer: Self-pay | Admitting: Sports Medicine

## 2024-10-10 VITALS — BP 118/70 | HR 91 | Ht 71.0 in | Wt 276.0 lb

## 2024-10-10 DIAGNOSIS — N189 Chronic kidney disease, unspecified: Secondary | ICD-10-CM | POA: Diagnosis not present

## 2024-10-10 DIAGNOSIS — M25572 Pain in left ankle and joints of left foot: Secondary | ICD-10-CM

## 2024-10-10 DIAGNOSIS — M25571 Pain in right ankle and joints of right foot: Secondary | ICD-10-CM | POA: Diagnosis not present

## 2024-10-10 DIAGNOSIS — M1039 Gout due to renal impairment, multiple sites: Secondary | ICD-10-CM | POA: Diagnosis not present

## 2024-10-10 LAB — BASIC METABOLIC PANEL WITH GFR
BUN: 24 mg/dL — ABNORMAL HIGH (ref 6–23)
CO2: 28 meq/L (ref 19–32)
Calcium: 9.2 mg/dL (ref 8.4–10.5)
Chloride: 99 meq/L (ref 96–112)
Creatinine, Ser: 1.39 mg/dL (ref 0.40–1.50)
GFR: 51.08 mL/min — ABNORMAL LOW (ref >60.00)
Glucose, Bld: 86 mg/dL (ref 70–99)
Potassium: 3.2 meq/L — ABNORMAL LOW (ref 3.5–5.1)
Sodium: 137 meq/L (ref 135–145)

## 2024-10-10 LAB — URIC ACID: Uric Acid, Serum: 5.5 mg/dL (ref 4.0–7.8)

## 2024-10-10 NOTE — Patient Instructions (Signed)
 Bmp and uric acid labs on the way out. Follow up as needed.

## 2024-10-10 NOTE — Addendum Note (Signed)
 Addended by: MAYBELL ALSTON R on: 10/10/2024 10:41 AM   Modules accepted: Orders

## 2024-10-11 MED ORDER — POTASSIUM CHLORIDE CRYS ER 20 MEQ PO TBCR: 20.0000 meq | EXTENDED_RELEASE_TABLET | Freq: Every day | ORAL | 1 refills | Status: DC

## 2024-10-11 NOTE — Addendum Note (Signed)
 Addended by: Grissel Tyrell L on: 10/11/2024 04:24 PM   Modules accepted: Orders

## 2024-10-17 ENCOUNTER — Ambulatory Visit

## 2024-10-17 ENCOUNTER — Ambulatory Visit: Attending: Cardiology

## 2024-10-17 DIAGNOSIS — I5022 Chronic systolic (congestive) heart failure: Secondary | ICD-10-CM

## 2024-10-17 DIAGNOSIS — Z950 Presence of cardiac pacemaker: Secondary | ICD-10-CM

## 2024-10-17 NOTE — Progress Notes (Signed)
 EPIC Encounter for ICM Monitoring  Patient Name: Nicholas Foley is a 73 y.o. male Date: 10/17/2024 Primary Care Physican: Dyane Anthony RAMAN, FNP Primary Cardiologist: Smith/Bensimhon Electrophysiologist: Cindie Nephrologist: [facility]: Dr Macel Pore Pacing:  89.9% 10/26/2023 Office Weight: 301 lbs (weight 319 lbs in 2022 & 2023) 01/30/2024 Weight: 295 lbs 04/09/2024 Weight: 302 lbs 06/08/2024 Weight: 290 lbs 07/10/2024 Weight: 285 lbs (total of 40 lb weight loss) 08/13/2024 Weight: 280 lbs  09/04/2024 Weight: 280 lbs 09/11/2024 Weight: 277 lbs 09/23/2024 Weight: 274 lbs 10/01/2024 Weight: 272 lbs 10/17/2024 Weight: 272 lbs    Clinical Status Since 04-Oct-2024 Time in AT/AF  <0.1 hr/day (<0.1%) Taking Eliquis         Spoke with patient and heart failure questions reviewed.  Transmission results reviewed.  Pt asymptomatic for fluid accumulation.  Reports feeling well at this time and voices no complaints.           Since 10/04/2024 Optivol reset: Optivol thoracic impedance continues to suggesting possible fluid accumulation starting 10/11/2024 and returned to baseline 10/16/2024.  Optivol reset 10/04/2024.      Prescribed: Furosemide  80 mg take 1 tablet(s) (80 mg total) twice a day.  Pt reports 08/13/2024 that Dr Bensimhon said he could take an extra 80 mg if needed.  Spironolactone  25 mg take 1 tablet daily Metolazone  2.5 mg 1 time dose 09/04/2024   Labs: 09/12/2024 Creatinine 1.95, BUN 36, Potassium 3.6, Sodium 136, GFR 36 07/04/2024 Creatinine 2.00, BUN 28, Potassium 4.3, Sodium 139, GFR 35 02/15/2024 Creatinine 1.87, BUN 37, Potassium 4.3, Sodium 138, GFR 38 01/26/2024 Creatinine 1.67, BUN 38, Potassium 4.4, Sodium 138, GFR 44 01/18/2024 Creatinine 2.17, BUN 42, Potassium 5.4, Sodium 136, GFR 32 A complete set of results can be found in Results Review.   Recommendation:  No changes and encouraged to call if experiencing any fluid symptoms.   Follow-up plan: ICM  clinic phone appointment on 11/26/2024.   91 day device clinic remote transmission 01/16/2025.     EP/Cardiology Office Visits:   Recall 01/13/2025 with Dr Cherrie    Recall 12/31/2024 with Dr Cindie.      Copy of ICM check sent to Dr Cindie.      Remote monitoring is medically necessary for Heart Failure Management.    Daily Thoracic Impedance ICM trend: 07/17/2024 through 10/16/2024.    12-14 Month Thoracic Impedance ICM trend:     [name], RN 10/17/2024 8:21 AM

## 2024-10-19 LAB — CUP PACEART REMOTE DEVICE CHECK
Battery Remaining Longevity: 29 mo
Battery Voltage: 2.91 V
Brady Statistic AP VP Percent: 0.22 %
Brady Statistic AP VS Percent: 0.02 %
Brady Statistic AS VP Percent: 89.69 %
Brady Statistic AS VS Percent: 10.07 %
Brady Statistic RA Percent Paced: 0.29 %
Brady Statistic RV Percent Paced: 69.41 %
Date Time Interrogation Session: 20251125210208
Implantable Lead Connection Status: 753985
Implantable Lead Connection Status: 753985
Implantable Lead Connection Status: 753985
Implantable Lead Implant Date: 20200123
Implantable Lead Implant Date: 20200123
Implantable Lead Implant Date: 20200123
Implantable Lead Location: 753858
Implantable Lead Location: 753859
Implantable Lead Location: 753860
Implantable Lead Model: 4598
Implantable Lead Model: 5076
Implantable Lead Model: 5076
Implantable Pulse Generator Implant Date: 20200123
Lead Channel Impedance Value: 1102 Ohm
Lead Channel Impedance Value: 1292 Ohm
Lead Channel Impedance Value: 1330 Ohm
Lead Channel Impedance Value: 361 Ohm
Lead Channel Impedance Value: 456 Ohm
Lead Channel Impedance Value: 456 Ohm
Lead Channel Impedance Value: 456 Ohm
Lead Channel Impedance Value: 475 Ohm
Lead Channel Impedance Value: 684 Ohm
Lead Channel Impedance Value: 684 Ohm
Lead Channel Impedance Value: 798 Ohm
Lead Channel Impedance Value: 950 Ohm
Lead Channel Impedance Value: 969 Ohm
Lead Channel Impedance Value: 988 Ohm
Lead Channel Pacing Threshold Amplitude: 0.375 V
Lead Channel Pacing Threshold Amplitude: 0.625 V
Lead Channel Pacing Threshold Amplitude: 3.25 V
Lead Channel Pacing Threshold Pulse Width: 0.4 ms
Lead Channel Pacing Threshold Pulse Width: 0.4 ms
Lead Channel Pacing Threshold Pulse Width: 0.8 ms
Lead Channel Sensing Intrinsic Amplitude: 1.5 mV
Lead Channel Sensing Intrinsic Amplitude: 1.5 mV
Lead Channel Sensing Intrinsic Amplitude: 8 mV
Lead Channel Sensing Intrinsic Amplitude: 8 mV
Lead Channel Setting Pacing Amplitude: 1.5 V
Lead Channel Setting Pacing Amplitude: 2.5 V
Lead Channel Setting Pacing Amplitude: 4 V
Lead Channel Setting Pacing Pulse Width: 0.4 ms
Lead Channel Setting Pacing Pulse Width: 0.8 ms
Lead Channel Setting Sensing Sensitivity: 2 mV
Zone Setting Status: 755011
Zone Setting Status: 755011

## 2024-10-22 ENCOUNTER — Ambulatory Visit: Payer: Medicare Other | Admitting: Neurology

## 2024-10-22 ENCOUNTER — Encounter: Payer: Self-pay | Admitting: Neurology

## 2024-10-22 ENCOUNTER — Ambulatory Visit: Payer: Self-pay | Admitting: Cardiology

## 2024-10-23 NOTE — Progress Notes (Signed)
 Remote PPM Transmission

## 2024-10-31 ENCOUNTER — Telehealth: Payer: Self-pay | Admitting: Physician Assistant

## 2024-10-31 ENCOUNTER — Other Ambulatory Visit: Payer: Self-pay | Admitting: *Deleted

## 2024-10-31 DIAGNOSIS — Z79899 Other long term (current) drug therapy: Secondary | ICD-10-CM

## 2024-10-31 NOTE — Telephone Encounter (Signed)
 BMET ordered and released for potassium check.

## 2024-10-31 NOTE — Telephone Encounter (Signed)
 Pt is here regarding starting potassium. Pt was informed to go to [facility] to have checked but [facility] told him they don't do it. Please advise.

## 2024-11-01 LAB — BASIC METABOLIC PANEL WITH GFR
BUN/Creatinine Ratio: 19 (ref 10–24)
BUN: 36 mg/dL — ABNORMAL HIGH (ref 8–27)
CO2: 22 mmol/L (ref 20–29)
Calcium: 9.5 mg/dL (ref 8.6–10.2)
Chloride: 99 mmol/L (ref 96–106)
Creatinine, Ser: 1.85 mg/dL — ABNORMAL HIGH (ref 0.76–1.27)
Glucose: 87 mg/dL (ref 70–99)
Potassium: 4.7 mmol/L (ref 3.5–5.2)
Sodium: 138 mmol/L (ref 134–144)
eGFR: 38 mL/min/1.73 — ABNORMAL LOW (ref >59)

## 2024-11-02 ENCOUNTER — Other Ambulatory Visit (HOSPITAL_COMMUNITY): Payer: Self-pay | Admitting: Cardiology

## 2024-11-02 MED ORDER — FUROSEMIDE 80 MG PO TABS: 80.0000 mg | ORAL_TABLET | Freq: Two times a day (BID) | ORAL | 3 refills | Status: AC

## 2024-11-06 ENCOUNTER — Ambulatory Visit: Payer: Self-pay | Admitting: Cardiology

## 2024-11-23 ENCOUNTER — Encounter: Payer: Self-pay | Admitting: Cardiology

## 2024-11-26 ENCOUNTER — Telehealth: Payer: Self-pay

## 2024-11-26 ENCOUNTER — Ambulatory Visit: Attending: Cardiovascular Disease

## 2024-11-26 DIAGNOSIS — Z950 Presence of cardiac pacemaker: Secondary | ICD-10-CM | POA: Diagnosis not present

## 2024-11-26 DIAGNOSIS — I5022 Chronic systolic (congestive) heart failure: Secondary | ICD-10-CM

## 2024-11-26 NOTE — Progress Notes (Signed)
 EPIC Encounter for ICM Monitoring  Patient Name: Nicholas Foley is a 74 y.o. male Date: 11/26/2024 Primary Care Physican: Dyane Anthony RAMAN, FNP Primary Cardiologist: Smith/Bensimhon Electrophysiologist: Mealor Nephrologist: [facility]: Dr Macel Pore Pacing:  93.1% 10/26/2023 Office Weight: 301 lbs (weight 319 lbs in 2022 & 2023) 01/30/2024 Weight: 295 lbs 04/09/2024 Weight: 302 lbs 06/08/2024 Weight: 290 lbs 07/10/2024 Weight: 285 lbs (total of 40 lb weight loss) 08/13/2024 Weight: 280 lbs  09/04/2024 Weight: 280 lbs 09/11/2024 Weight: 277 lbs 09/23/2024 Weight: 274 lbs 10/01/2024 Weight: 272 lbs 10/17/2024 Weight: 272 lbs    Clinical Status Since 16-Oct-2024 Time in AT/AF  <0.1 hr/day (<0.1%) Taking Eliquis         Attempted call to patient and unable to reach.    Transmission results reviewed.          Since 10/04/2024 Optivol reset: Optivol thoracic impedance continues to suggesting possible fluid accumulation starting 11/24/2024.  Optivol reset 10/04/2024.      Prescribed: Furosemide  80 mg take 1 tablet(s) (80 mg total) twice a day.  Pt reports 08/13/2024 that Dr Bensimhon said he could take an extra 80 mg if needed.  Spironolactone  25 mg take 1 tablet daily Metolazone  2.5 mg 1 time dose 09/04/2024   Labs: 10/31/2024 Creatinine 1.85, BUN 36, Potassium 4.7, Sodium 138, GFR 38 10/10/2024 Creatinine 1.39, BUN 24, Potassium 3.2, Sodium 137 09/12/2024 Creatinine 1.95, BUN 36, Potassium 3.6, Sodium 136, GFR 36 07/04/2024 Creatinine 2.00, BUN 28, Potassium 4.3, Sodium 139, GFR 35 02/15/2024 Creatinine 1.87, BUN 37, Potassium 4.3, Sodium 138, GFR 38 01/26/2024 Creatinine 1.67, BUN 38, Potassium 4.4, Sodium 138, GFR 44 01/18/2024 Creatinine 2.17, BUN 42, Potassium 5.4, Sodium 136, GFR 32 A complete set of results can be found in Results Review.   Recommendation:  Unable to reach.     Follow-up plan: ICM clinic phone appointment on  12/27/2024.   91 day device clinic remote  transmission 01/16/2025.     EP/Cardiology Office Visits:   Recall 01/13/2025 with Dr Cherrie.  12/26/2024 with Charlies Arthur, PA.    Recall 12/31/2024 with Dr Cindie.      Copy of ICM check sent to Dr Nancey.     Remote monitoring is medically necessary for Heart Failure Management.    Daily Thoracic Impedance ICM trend: 08/26/2024 through 11/26/2024.    12-14 Month Thoracic Impedance ICM trend:     [name], RN 11/26/2024 4:35 PM

## 2024-11-26 NOTE — Telephone Encounter (Signed)
 Remote ICM transmission received.  Attempted call to patient regarding ICM remote transmission and no answer.

## 2024-11-27 DIAGNOSIS — I480 Paroxysmal atrial fibrillation: Secondary | ICD-10-CM

## 2024-11-27 NOTE — Progress Notes (Addendum)
 Attempted return call to patient and unable to reach.  Left detailed message per DPR regarding transmission.  Transmission results reviewed.

## 2024-11-27 NOTE — Progress Notes (Signed)
 Spoke with patient and heart failure questions reviewed.  Transmission results reviewed.  Pt had the stomach flu between Christmas and New Years.  He increased his fluid intake after he started feeling better which may be the reason for fluid accumulation.  His took extra Lasix  today and will send remote transmission to recheck fluid levels on Friday 12/01/2023.  Current wt is 265 lbs (75 lb wt loss).

## 2024-11-30 ENCOUNTER — Ambulatory Visit: Attending: Cardiovascular Disease

## 2024-11-30 DIAGNOSIS — I5022 Chronic systolic (congestive) heart failure: Secondary | ICD-10-CM

## 2024-11-30 DIAGNOSIS — Z950 Presence of cardiac pacemaker: Secondary | ICD-10-CM

## 2024-11-30 NOTE — Progress Notes (Signed)
 EPIC Encounter for ICM Monitoring  Patient Name: Nicholas Foley is a 74 y.o. male Date: 11/30/2024 Primary Care Physican: Dyane Anthony RAMAN, FNP Primary Cardiologist: Smith/Bensimhon Electrophysiologist: Mealor Nephrologist: [facility]: Dr Macel Pore Pacing:  93.1% 10/26/2023 Office Weight: 301 lbs (weight 319 lbs in 2022 & 2023) 01/30/2024 Weight: 295 lbs 04/09/2024 Weight: 302 lbs 06/08/2024 Weight: 290 lbs 07/10/2024 Weight: 285 lbs (total of 40 lb weight loss) 08/13/2024 Weight: 280 lbs  09/04/2024 Weight: 280 lbs 09/11/2024 Weight: 277 lbs 09/23/2024 Weight: 274 lbs 10/01/2024 Weight: 272 lbs 10/17/2024 Weight: 272 lbs 11/27/2024 Weight: 265 lbs    Clinical Status Since 16-Oct-2024 Time in AT/AF  <0.1 hr/day (<0.1%) Taking Eliquis         Attempted call to patient and unable to reach.    Transmission results reviewed.          Since 11/26/2024 ICM Remote Transmission: Optivol thoracic impedance continues to suggesting fluid levels returned close to baseline after taking extra Lasix .  Optivol reset 10/04/2024.      Prescribed: Furosemide  80 mg take 1 tablet(s) (80 mg total) twice a day.  Pt reports 08/13/2024 that Dr Bensimhon said he could take an extra 80 mg if needed.  Spironolactone  25 mg take 1 tablet daily Metolazone  2.5 mg 1 time dose 09/04/2024   Labs: 10/31/2024 Creatinine 1.85, BUN 36, Potassium 4.7, Sodium 138, GFR 38 10/10/2024 Creatinine 1.39, BUN 24, Potassium 3.2, Sodium 137 09/12/2024 Creatinine 1.95, BUN 36, Potassium 3.6, Sodium 136, GFR 36 07/04/2024 Creatinine 2.00, BUN 28, Potassium 4.3, Sodium 139, GFR 35 02/15/2024 Creatinine 1.87, BUN 37, Potassium 4.3, Sodium 138, GFR 38 01/26/2024 Creatinine 1.67, BUN 38, Potassium 4.4, Sodium 138, GFR 44 01/18/2024 Creatinine 2.17, BUN 42, Potassium 5.4, Sodium 136, GFR 32 A complete set of results can be found in Results Review.   Recommendation:  No changes and encouraged to call if experiencing any fluid  symptoms.   Follow-up plan: ICM clinic phone appointment on 12/27/2024.   91 day device clinic remote transmission 01/16/2025.     EP/Cardiology Office Visits:   12/20/2024 with Dr Cherrie.  12/26/2024 with Charlies Arthur, PA.    Recall 12/31/2024 with Dr Cindie.      Copy of ICM check sent to Dr Nancey.  Remote monitoring is medically necessary for Heart Failure Management.    Daily Thoracic Impedance ICM trend: 08/31/2024 through 11/30/2024.    12-14 Month Thoracic Impedance ICM trend:     [name], RN 11/30/2024 11:06 AM

## 2024-12-06 ENCOUNTER — Other Ambulatory Visit: Payer: Self-pay

## 2024-12-06 NOTE — Telephone Encounter (Signed)
 In accordance with refill protocols, please review and address the following requirements before this medication refill can be authorized:  Labs  -Cr 10/31/24 Abn

## 2024-12-07 ENCOUNTER — Telehealth (HOSPITAL_COMMUNITY): Payer: Self-pay

## 2024-12-07 NOTE — Telephone Encounter (Signed)
 Spoke to patient about lab work. Aware of stopping potassium. Has  follow up appointment scheduled

## 2024-12-10 ENCOUNTER — Other Ambulatory Visit (HOSPITAL_COMMUNITY): Payer: Self-pay | Admitting: Internal Medicine

## 2024-12-11 ENCOUNTER — Encounter: Payer: Self-pay | Admitting: Neurology

## 2024-12-11 ENCOUNTER — Ambulatory Visit: Admitting: Neurology

## 2024-12-11 VITALS — BP 115/74 | HR 67 | Ht 71.0 in | Wt 272.0 lb

## 2024-12-11 DIAGNOSIS — R202 Paresthesia of skin: Secondary | ICD-10-CM | POA: Diagnosis not present

## 2024-12-11 MED ORDER — GABAPENTIN 100 MG PO CAPS: 200.0000 mg | ORAL_CAPSULE | Freq: Every day | ORAL | 3 refills | Status: AC

## 2024-12-11 NOTE — Progress Notes (Signed)
 "   Follow-up Visit   Date: 12/11/2024    Nicholas Foley MRN: [id number] DOB: March 04, 1951    Nicholas Foley is a 74 y.o. right-handed Caucasian male with  OSA on CPAP, hypertension, atrial fibrillation, CHF, COPD, and CKD returning to the clinic for follow-up of bilateral feet pain.  The patient was accompanied to the clinic by self.  IMPRESSION/PLAN: Bilateral feet paresthesias, possible small fiber neuropathy.  NCS/EMG of the legs was normal.  Neuropathy labs are normal. Symptoms are controlled on gabapentin  200mg  at bedtime, which was refilled.  Return to clinic in 1 year  ------------------------------------------ History of present illness: Starting around 2022, he began having numbness in the balls of the feet and toes.  It now involves the midfoot and also has shooting pain associated with it.  No falls or imbalance. No low back or radicular pain.  He denies weakness.     Nonsmoker.  He rarely drinks alcohol.  No exposure to chemotherapy.  No family history of neuropathy.    In 2017, he was seen by me for bilateral hand numbness and found to have sensorimotor polyneuropathy with demyelinating and axonal changes.  He no longer has hand paresthesias.    UPDATE 01/18/2024:  He is here for follow-up visit.  Since starting gabapentin  200mg  at bedtime, his bilateral feet pain has significantly improved.  He rarely has sharp shooting pain.  He continues to have numbness.  Overall, he is very pleased with pain relief and is able to walk better, also.    UPDATE 12/11/2024:   He is here for 1 year follow-up visit.  He has been doing well and has lost 70lb with lifestyle changes, exercise, and Zepbound .  He is going to the gym with a trainer twice per week.  Neuropathy is stable and only bothersome at night time.  He takes gabapentin  200mg  at bedtime which helps.  No imbalance, falls, or weakness.      Medications:  Current Outpatient Medications on File Prior to Visit  Medication Sig  Dispense Refill   acetaminophen  (TYLENOL ) 650 MG CR tablet Take 1,300 mg by mouth in the morning and at bedtime. Arthritis strength     ADVAIR DISKUS 250-50 MCG/DOSE AEPB Inhale 1 puff into the lungs 2 (two) times daily.     allopurinol (ZYLOPRIM) 100 MG tablet Take 200 mg by mouth daily.     carvedilol  (COREG ) 12.5 MG tablet Take 1 tablet (12.5 mg total) by mouth 2 (two) times daily with a meal. 60 tablet 6   cholecalciferol (VITAMIN D3) 25 MCG (1000 UT) tablet Take 1,000 Units by mouth 2 times daily at 12 noon and 4 pm.     CIALIS 20 MG tablet Take 20 mg by mouth daily as needed for erectile dysfunction.   0   cyclobenzaprine (FLEXERIL) 10 MG tablet Take 10 mg by mouth 3 (three) times daily.     ELIQUIS  5 MG TABS tablet TAKE 1 TABLET(5 MG) BY MOUTH TWICE DAILY 180 tablet 1   empagliflozin  (JARDIANCE ) 10 MG TABS tablet Take 1 tablet (10 mg total) by mouth daily before breakfast. 90 tablet 3   ferrous sulfate 325 (65 FE) MG tablet Take 325 mg by mouth daily with breakfast.     fluticasone  (FLONASE ) 50 MCG/ACT nasal spray Place 1 spray into both nostrils 2 (two) times daily.   0   furosemide  (LASIX ) 80 MG tablet Take 1 tablet (80 mg total) by mouth 2 (two) times daily. 60 tablet 3  gabapentin  (NEURONTIN ) 100 MG capsule Take 2 capsules (200 mg total) by mouth at bedtime. 180 capsule 3   Multiple Vitamins-Minerals (PRESERVISION AREDS PO) Take 1 capsule by mouth 2 (two) times daily.     omeprazole (PRILOSEC OTC) 20 MG tablet Take 20 mg by mouth daily.     sacubitril -valsartan  (ENTRESTO ) 24-26 MG Take 1 tablet by mouth 2 (two) times daily. 180 tablet 3   spironolactone  (ALDACTONE ) 25 MG tablet TAKE 1 TABLET(25 MG) BY MOUTH DAILY 90 tablet 3   tamsulosin (FLOMAX) 0.4 MG CAPS capsule Take 0.4 mg by mouth daily.     tirzepatide  (ZEPBOUND ) 15 MG/0.5ML Pen Inject 15 mg into the skin once a week. 2 mL 2   vitamin B-12 (CYANOCOBALAMIN ) 1000 MCG tablet Take 1,000 mcg by mouth daily.     zolpidem  (AMBIEN  CR)  12.5 MG CR tablet Take 12.5 mg by mouth at bedtime as needed for sleep.     No current facility-administered medications on file prior to visit.    Allergies:  Allergies  Allergen Reactions   Ace Inhibitors Cough   Angiotensin Receptor Blockers Cough    Vital Signs:  BP 115/74   Pulse 67   Ht 5' 11 (1.803 m)   Wt 272 lb (123.4 kg)   SpO2 98%   BMI 37.94 kg/m   Neurological Exam: MENTAL STATUS including orientation to time, place, person, recent and remote memory, attention span and concentration, language, and fund of knowledge is normal.  Speech is not dysarthric.  CRANIAL NERVES: Normal conjugate, extra-ocular eye movements in all directions of gaze.  No ptosis.  Face is symmetric.  MOTOR:  Motor strength is 5/5 in all extremities.  No atrophy, fasciculations or abnormal movements.  No pronator drift.  Tone is normal.    MSRs:  Reflexes are 2+/4 throughout.  SENSORY:  Intact to vibration throughout.  COORDINATION/GAIT:   Gait narrow based and stable.    Data: MRI cervical spine 07/15/2016: 1. At C4-5 there is a mild broad-based disc bulge. Mild bilateral foraminal narrowing. 2. At C5-6 there is a mild broad-based disc bulge. Left uncovertebral degenerative change resulting in moderate left foraminal narrowing. 3. At C6-7 there is a mild broad-based disc bulge. Left uncovertebral degenerative change. Mild left foraminal stenosis.   NCS/EMG of the legs 08/12/2016: This is a normal study of the lower extremities. In particular, there is no evidence of a sensorimotor polyneuropathy or lumbosacral radiculopathy.    NCS/EMG of the arms 07/22/2016: The electrophysiologic studies are most consistent with a subacute, symmetric sensorimotor polyneuropathy, demyelinating and axon loss in type, affecting the upper extremities. Overall, these findings are moderate in degree electrically.    NCS/EMG of the legs 09/09/2023: This is a normal study of the lower extremities.  In  particular, there is no evidence of a large fiber sensorimotor polyneuropathy or lumbosacral radiculopathy.    Thank you for allowing me to participate in patient's care.  If I can answer any additional questions, I would be pleased to do so.    Sincerely,    Toriana Sponsel K. Tobie, DO   "

## 2024-12-14 ENCOUNTER — Other Ambulatory Visit (HOSPITAL_COMMUNITY): Payer: Self-pay

## 2024-12-14 MED ORDER — CARVEDILOL 12.5 MG PO TABS: 12.5000 mg | ORAL_TABLET | Freq: Two times a day (BID) | ORAL | 6 refills | Status: DC

## 2024-12-20 ENCOUNTER — Ambulatory Visit (HOSPITAL_COMMUNITY)
Admission: RE | Admit: 2024-12-20 | Discharge: 2024-12-20 | Disposition: A | Discharge status: Home / Self Care | Source: Ambulatory Visit | Attending: Internal Medicine | Admitting: Internal Medicine

## 2024-12-20 ENCOUNTER — Encounter (HOSPITAL_COMMUNITY): Payer: Self-pay | Admitting: Internal Medicine

## 2024-12-20 VITALS — BP 90/50 | HR 83 | Wt 265.0 lb

## 2024-12-20 DIAGNOSIS — Z79899 Other long term (current) drug therapy: Secondary | ICD-10-CM | POA: Diagnosis not present

## 2024-12-20 DIAGNOSIS — I5042 Chronic combined systolic (congestive) and diastolic (congestive) heart failure: Secondary | ICD-10-CM | POA: Insufficient documentation

## 2024-12-20 DIAGNOSIS — I459 Conduction disorder, unspecified: Secondary | ICD-10-CM | POA: Insufficient documentation

## 2024-12-20 DIAGNOSIS — I447 Left bundle-branch block, unspecified: Secondary | ICD-10-CM | POA: Diagnosis not present

## 2024-12-20 DIAGNOSIS — I428 Other cardiomyopathies: Secondary | ICD-10-CM | POA: Diagnosis not present

## 2024-12-20 DIAGNOSIS — I480 Paroxysmal atrial fibrillation: Secondary | ICD-10-CM | POA: Insufficient documentation

## 2024-12-20 DIAGNOSIS — N1832 Chronic kidney disease, stage 3b: Secondary | ICD-10-CM | POA: Diagnosis not present

## 2024-12-20 DIAGNOSIS — I3139 Other pericardial effusion (noninflammatory): Secondary | ICD-10-CM | POA: Insufficient documentation

## 2024-12-20 DIAGNOSIS — I959 Hypotension, unspecified: Secondary | ICD-10-CM | POA: Insufficient documentation

## 2024-12-20 DIAGNOSIS — Z6836 Body mass index (BMI) 36.0-36.9, adult: Secondary | ICD-10-CM | POA: Insufficient documentation

## 2024-12-20 DIAGNOSIS — I77810 Thoracic aortic ectasia: Secondary | ICD-10-CM | POA: Diagnosis not present

## 2024-12-20 DIAGNOSIS — Z7951 Long term (current) use of inhaled steroids: Secondary | ICD-10-CM | POA: Diagnosis not present

## 2024-12-20 DIAGNOSIS — R000 Tachycardia, unspecified: Secondary | ICD-10-CM | POA: Insufficient documentation

## 2024-12-20 DIAGNOSIS — K7689 Other specified diseases of liver: Secondary | ICD-10-CM | POA: Insufficient documentation

## 2024-12-20 DIAGNOSIS — I110 Hypertensive heart disease with heart failure: Secondary | ICD-10-CM | POA: Insufficient documentation

## 2024-12-20 DIAGNOSIS — Z950 Presence of cardiac pacemaker: Secondary | ICD-10-CM

## 2024-12-20 DIAGNOSIS — J449 Chronic obstructive pulmonary disease, unspecified: Secondary | ICD-10-CM | POA: Diagnosis not present

## 2024-12-20 DIAGNOSIS — Z7984 Long term (current) use of oral hypoglycemic drugs: Secondary | ICD-10-CM | POA: Insufficient documentation

## 2024-12-20 DIAGNOSIS — G4733 Obstructive sleep apnea (adult) (pediatric): Secondary | ICD-10-CM | POA: Diagnosis not present

## 2024-12-20 DIAGNOSIS — Z87891 Personal history of nicotine dependence: Secondary | ICD-10-CM | POA: Insufficient documentation

## 2024-12-20 DIAGNOSIS — Z7901 Long term (current) use of anticoagulants: Secondary | ICD-10-CM | POA: Insufficient documentation

## 2024-12-20 LAB — BASIC METABOLIC PANEL WITH GFR
Anion gap: 14 (ref 5–15)
BUN: 35 mg/dL — ABNORMAL HIGH (ref 8–23)
CO2: 26 mmol/L (ref 22–32)
Calcium: 9.3 mg/dL (ref 8.9–10.3)
Chloride: 99 mmol/L (ref 98–111)
Creatinine, Ser: 1.78 mg/dL — ABNORMAL HIGH (ref 0.61–1.24)
GFR, Estimated: 40 mL/min — ABNORMAL LOW
Glucose, Bld: 88 mg/dL (ref 70–99)
Potassium: 4 mmol/L (ref 3.5–5.1)
Sodium: 138 mmol/L (ref 135–145)

## 2024-12-20 LAB — PRO BRAIN NATRIURETIC PEPTIDE: Pro Brain Natriuretic Peptide: 454 pg/mL — ABNORMAL HIGH

## 2024-12-20 MED ORDER — CARVEDILOL 6.25 MG PO TABS: 6.2500 mg | ORAL_TABLET | Freq: Two times a day (BID) | ORAL | 6 refills | Status: AC

## 2024-12-20 MED ORDER — SACUBITRIL-VALSARTAN 24-26 MG PO TABS: ORAL_TABLET | ORAL | Status: AC

## 2024-12-20 NOTE — Patient Instructions (Signed)
 Medication Changes:  DECREASE Carvedilol  to 6.25 mg Twice daily   DECREASE Entresto  to 12/13 mg (1/2 tab) Twice daily   Lab Work:  Labs done today, your results will be available in MyChart, we will contact you for abnormal readings.   Special Instructions // Education:  Do the following things EVERYDAY: Weigh yourself in the morning before breakfast. Write it down and keep it in a log. Take your medicines as prescribed Eat low salt foods--Limit salt (sodium) to 2000 mg per day.  Stay as active as you can everyday Limit all fluids for the day to less than 2 liters   Follow-Up in:   Please follow up with our heart failure pharmacist in 2 weeks  Your physician recommends that you schedule a follow-up appointment in: 8 weeks   At the [facility], you and your health needs are our priority. We have a designated team specialized in the treatment of Heart Failure. This Care Team includes your primary Heart Failure Specialized Cardiologist (physician), Advanced Practice Providers (APPs- Physician Assistants and Nurse Practitioners), and Pharmacist who all work together to provide you with the care you need, when you need it.   You may see any of the following providers on your designated Care Team at your next follow up:  Dr. Toribio Fuel Dr. Ezra Shuck Dr. Odis Brownie Greig Mosses, NP Caffie Shed, [place] [facility] [place], [place] Beckey Coe, NP Jordan Lee, NP Tinnie Redman, PharmD   Please be sure to bring in all your medications bottles to every appointment.   Need to Contact Us :  If you have any questions or concerns before your next appointment please send us  a message through [place] or call our office at [phone].    TO LEAVE A MESSAGE FOR THE NURSE SELECT OPTION 2, PLEASE LEAVE A MESSAGE INCLUDING: YOUR NAME DATE OF BIRTH CALL BACK NUMBER REASON FOR CALL**this is important as we prioritize the call backs  YOU WILL RECEIVE A  CALL BACK THE SAME DAY AS LONG AS YOU CALL BEFORE 4:00 PM

## 2024-12-20 NOTE — Progress Notes (Signed)
"   ReDS Vest / Clip - 12/20/24 1100       ReDS Vest / Clip   Station Marker D    Ruler Value 34    ReDS Value Range Low volume    ReDS Actual Value 31          "

## 2024-12-20 NOTE — Progress Notes (Signed)
 "    Advanced [facility] Failure Clinic Note   Date:  12/20/2024   ID:  SHEMUEL Foley, DOB Mar 05, 1951, MRN [id number]  Location: Home  Provider location: [facility] Type of Visit: Established patient  PCP:  Dyane Anthony RAMAN, FNP  Primary Cardiologist:  Victory LELON Claudene DOUGLAS, MD (Inactive) Nephrology: Dr. Macel HF Cardiologist: Floride Hutmacher  Chief Complaint: Heart Failure follow-up    HPI: [name] is a 74 y.o. with morbid obesity, chronic systolic heart failure, COPD, OSA, PAF, previously on amio-> stopped 2018,  HTN, LBBB, Medtronic CRT-P, and former smoker.   He has h/o NICM which seems to date back to 2014. Had cardiac cath in 1/20 with no CAD and well compensated filling pressures.    Admitted in 1/20 for CRT-P device in setting of LBBB.   Echo 01/30/20 EF 40-45% (read as 35-40%) Echo EF 2/22 45% (in some images 45-50%) Limited by septal paradox.   Had recurrent AF. Underwent DC-CV 8/22.   Echo 04/12/22 showed EF 50-55%, LV dyysynchrony, grade I DD,  RV ok.   Developed recurrent AF in early 9/23. Seen in AF clinic. Amio started. Had DC-CV attempt on 08/13/22 which failed x 3. Successful DCCV 11/23 to NSR. Acute visit 12/23, NYHA II-III and volume up. Instructed to take metolazone  2.5/40 KCL x 1.  TEE 12/30/22 showing EF 55%, moderate LAE, RV mildy HK, moderate pericardial effusion and no LAA clot. Then had successful PVI. Started on colchicine  and PPI.  Had AF ablation in 2/24  Echo 10/24 EF 45-50%, RV ok, aortic root dilation 41 mm  Today he returns for HF follow up. Overall feeling good. Has lost 70 pounds on ZepBound . Goes to gym 2x/week with trainer and does cardio and free weights. Main issue is low BP and dizziness. Feels more sluggish. No edema, orthopnea or PND  Cardiac Studies  - CPX (09/18/19):  Limited by obesity and mild heart failure Peak VO2 15.1  (80% predicted peak VO2) - when corrected to ibw pVO2 27.5 Slope 33 RER 1.02    - L/RHC  (1/20) No coronary disease.  RA 13 PA 37/20 (27)  PCWP 14 CO 8.5 CI 3.3       Past Medical History:  Diagnosis Date   Atrial fibrillation with rapid ventricular response (HCC) 08/02/2013   CHF (congestive heart failure) (HCC)    Chronic systolic dysfunction of left ventricle 08/02/2013   BNP greater than 2000    COPD (chronic obstructive pulmonary disease) (HCC)    Moderate by PFTs with response to bronchodilators, May 2014   Erectile dysfunction    Hypertension    Insomnia 08/02/13   Left bundle branch block 08/02/2013   Morbid obesity (HCC) 08/02/2013   OSA (obstructive sleep apnea)    severe OSA with AHI 74/hr now on CPAP at 8cm H2O   Past Surgical History:  Procedure Laterality Date   ATRIAL FIBRILLATION ABLATION N/A 12/30/2022   Procedure: ATRIAL FIBRILLATION ABLATION;  Surgeon: Cindie Ole DASEN, MD;  Location: [facility];  Service: Cardiovascular;  Laterality: N/A;   BIV PACEMAKER INSERTION CRT-P N/A 12/14/2018   Medtronic model [id number] Percepta Quad CRT-P MRI Surescan (serial Number [id number]) device implanted by Dr Kelsie for CHF and LBBB   CARDIOVERSION N/A 08/06/2013   Procedure: CARDIOVERSION;  Surgeon: Redell RAMAN Shallow, MD;  Location: [facility] ENDOSCOPY;  Service: Cardiovascular;  Laterality: N/A;  spoke with [name]    CARDIOVERSION N/A 09/03/2013   Procedure: CARDIOVERSION;  Surgeon:  Victory LELON Claudene DOUGLAS, MD;  Location: [facility] ENDOSCOPY;  Service: Cardiovascular;  Laterality: N/A;   CARDIOVERSION N/A 07/21/2021   Procedure: CARDIOVERSION;  Surgeon: Rolan Ezra RAMAN, MD;  Location: [facility] ENDOSCOPY;  Service: Cardiovascular;  Laterality: N/A;   CARDIOVERSION N/A 08/13/2022   Procedure: CARDIOVERSION;  Surgeon: Delford Maude BROCKS, MD;  Location: [facility] ENDOSCOPY;  Service: Cardiovascular;  Laterality: N/A;   CARDIOVERSION N/A 09/03/2022   Procedure: CARDIOVERSION;  Surgeon: Cherrie Toribio SAUNDERS, MD;  Location: [facility] ENDOSCOPY;  Service: Cardiovascular;  Laterality: N/A;   CARDIOVERSION N/A  09/28/2022   Procedure: CARDIOVERSION;  Surgeon: Inocencio Soyla Lunger, MD;  Location: [facility] CV [facility];  Service: Cardiovascular;  Laterality: N/A;   chronic systolic dysfu     NASAL SEPTUM SURGERY     RIGHT/LEFT HEART CATH AND CORONARY ANGIOGRAPHY N/A 12/04/2018   Procedure: RIGHT/LEFT HEART CATH AND CORONARY ANGIOGRAPHY;  Surgeon: Claudene Victory LELON, MD;  Location: [facility];  Service: Cardiovascular;  Laterality: N/A;   TEE WITHOUT CARDIOVERSION N/A 08/06/2013   Procedure: TRANSESOPHAGEAL ECHOCARDIOGRAM (TEE);  Surgeon: Redell RAMAN Shallow, MD;  Location: [facility] ENDOSCOPY;  Service: Cardiovascular;  Laterality: N/A;   TEE WITHOUT CARDIOVERSION N/A 12/30/2022   Procedure: TRANSESOPHAGEAL ECHOCARDIOGRAM (TEE);  Surgeon: Cherrie Toribio SAUNDERS, MD;  Location: [facility] INVASIVE CV LAB;  Service: Cardiovascular;  Laterality: N/A;   VASECTOMY     Current Outpatient Medications  Medication Sig Dispense Refill   acetaminophen  (TYLENOL ) 650 MG CR tablet Take 1,300 mg by mouth in the morning and at bedtime. Arthritis strength     ADVAIR DISKUS 250-50 MCG/DOSE AEPB Inhale 1 puff into the lungs 2 (two) times daily.     allopurinol (ZYLOPRIM) 100 MG tablet Take 200 mg by mouth daily.     carvedilol  (COREG ) 12.5 MG tablet Take 1 tablet (12.5 mg total) by mouth 2 (two) times daily with a meal. 60 tablet 6   cholecalciferol (VITAMIN D3) 25 MCG (1000 UT) tablet Take 1,000 Units by mouth 2 times daily at 12 noon and 4 pm.     CIALIS 20 MG tablet Take 20 mg by mouth daily as needed for erectile dysfunction.   0   cyclobenzaprine (FLEXERIL) 10 MG tablet Take 10 mg by mouth 3 (three) times daily.     ELIQUIS  5 MG TABS tablet TAKE 1 TABLET(5 MG) BY MOUTH TWICE DAILY 180 tablet 1   empagliflozin  (JARDIANCE ) 10 MG TABS tablet Take 1 tablet (10 mg total) by mouth daily before breakfast. 90 tablet 3   ferrous sulfate 325 (65 FE) MG tablet Take 325 mg by mouth daily with breakfast.     fluticasone  (FLONASE ) 50 MCG/ACT nasal spray  Place 1 spray into both nostrils 2 (two) times daily.   0   furosemide  (LASIX ) 80 MG tablet Take 1 tablet (80 mg total) by mouth 2 (two) times daily. 60 tablet 3   gabapentin  (NEURONTIN ) 100 MG capsule Take 2 capsules (200 mg total) by mouth at bedtime. 180 capsule 3   Multiple Vitamins-Minerals (PRESERVISION AREDS PO) Take 1 capsule by mouth 2 (two) times daily.     omeprazole (PRILOSEC OTC) 20 MG tablet Take 20 mg by mouth daily.     sacubitril -valsartan  (ENTRESTO ) 24-26 MG Take 1 tablet by mouth 2 (two) times daily. 180 tablet 3   spironolactone  (ALDACTONE ) 25 MG tablet TAKE 1 TABLET(25 MG) BY MOUTH DAILY 90 tablet 3   tamsulosin (FLOMAX) 0.4 MG CAPS capsule Take 0.4 mg by mouth daily.  tirzepatide  (ZEPBOUND ) 15 MG/0.5ML Pen Inject 15 mg into the skin once a week. 2 mL 2   vitamin B-12 (CYANOCOBALAMIN ) 1000 MCG tablet Take 1,000 mcg by mouth daily.     zolpidem  (AMBIEN  CR) 12.5 MG CR tablet Take 12.5 mg by mouth at bedtime as needed for sleep.     No current facility-administered medications for this encounter.    Allergies:   Ace inhibitors and Angiotensin receptor blockers   Social History:  The patient  reports that he quit smoking about 12 years ago. His smoking use included cigarettes and cigars. He started smoking about 55 years ago. He has a 64.5 pack-year smoking history. He has never used smokeless tobacco. He reports current alcohol use. He reports that he does not use drugs.   Family History:  The patient's family history includes Atrial fibrillation in his mother and sister; COPD in his father; Cerebral aneurysm in his brother; Healthy in his daughter; Heart failure in his father.   ROS:  Please see the history of present illness.   All other systems are personally reviewed and negative.   Recent Labs: 07/04/2024: Hemoglobin 15.2; Platelets 248 10/31/2024: BUN 36; Creatinine, Ser 1.85; Potassium 4.7; Sodium 138  Personally reviewed   Wt Readings from Last 3 Encounters:   12/20/24 120.2 kg (265 lb)  12/11/24 123.4 kg (272 lb)  10/10/24 125.2 kg (276 lb)    BP (!) 90/50   Pulse 83   Wt 120.2 kg (265 lb)   SpO2 96%   BMI 36.96 kg/m   Physical Exam General:  Sitting up in bed. No resp difficulty HEENT: normal Neck: supple. no JVD.  Cor: Regular rate & rhythm. No rubs, gallops or murmurs. Lungs: clear Abdomen: obese soft, nontender, nondistended.Good bowel sounds. Extremities: no cyanosis, clubbing, rash, edema Neuro: alert & orientedx3, cranial nerves grossly intact. moves all 4 extremities w/o difficulty. Affect pleasant  Device interrogation (personally reviewed): 93% VP, 1.8 hr/day activity, no AT/F. Optivol up Personally reviewed   ASSESSMENT AND PLAN: 1. Chronic Combined Systolic/Diastolic HF - NICM (thought due to LBBB). 12/2018 [facility] no coronary disease.  - s/p Medtronic CRT-P - Echo (1/19) EF 35-40%. ECHO 11/19 EF 45-50%.  - Echo (11/20): EF read as 35-40% but with Definity  I felt closed to 45% - Echo (3/21): EF read as 35-40% I think 40-45% - Echo EF (2/22): EF 45% (in some images 45-50%) Limited by septal paradox. - CPX test (10/20) suboptimal effort mostly limited to obesity  - Echo (5/23) EF 50-55%, LV dyysynchrony, grade I DD, RV ok.  - TEE (2/24): EF 55%, moderate LAE, RV mildy HK - Echo (10/24): EF 45-50%, RV ok - Echo 10/25 EF 50% ? Liver cyst - NYHA II Volume ok on exam and ReDS. ICD impedance inaccurate - Cut Entresto  24/26 mg bid -> 12/13 bid - Continue Lasix  80 mg bid. ReDS 31%. Considered cutting back dose but ReDS is in perfect rnge so will not cut back today - Cut carvedilol  12.5 mg bid -> 6.25 bid - Continue spiro 25 mg daily. - Continue Jadiance 10 mg daily. - ICD interrogated as above. Volume sensor no longer accurate in setting of weight loss. ReDS 31% - See PharmD 2 weeks. If SBP still < 100. Stop Entresto     2. PAF  - Underwent AF ablation 2/24  - Now off amio - No AF on device - Continue Eliquis    3. OSA -  Wears BiPap religiously - No changes   4. Morbid  Obesity - Body mass index is 36.96 kg/m.  - Has lost 70+ pounds. Much improved - Continue ZepBound   5. COPD  - No longer smoking. - No change. - Followed by Pulmonary  6. CKD 3b - Baseline SCr 1.7-2.0 - Continue SGLT2i. - Follows with Dr. Macel in Nephrology - Labs today   Toribio Fuel, MD  12/20/2024 11:06 AM  [facility] [address] [facility] [place] [address] [phone] (office) [phone] (fax) "

## 2024-12-20 NOTE — Progress Notes (Signed)
 31 day ICM Remote transmission canceled due to [facility] clinic is on hold until further notice.  91 day remote monitoring will continue per protocol.

## 2024-12-24 ENCOUNTER — Other Ambulatory Visit: Payer: Self-pay | Admitting: Pharmacist Clinician (PhC)/ Clinical Pharmacy Specialist

## 2024-12-24 MED ORDER — ZEPBOUND 15 MG/0.5ML ~~LOC~~ SOAJ: 15.0000 mg | SUBCUTANEOUS | 2 refills | Status: AC

## 2024-12-26 ENCOUNTER — Ambulatory Visit: Admitting: Physician Assistant

## 2024-12-27 ENCOUNTER — Ambulatory Visit

## 2025-01-08 ENCOUNTER — Ambulatory Visit (HOSPITAL_COMMUNITY)

## 2025-01-16 ENCOUNTER — Ambulatory Visit

## 2025-02-14 ENCOUNTER — Ambulatory Visit (HOSPITAL_COMMUNITY)

## 2025-02-20 ENCOUNTER — Ambulatory Visit: Admitting: Neurology

## 2025-03-11 ENCOUNTER — Ambulatory Visit: Admitting: Neurology

## 2025-04-17 ENCOUNTER — Ambulatory Visit

## 2025-07-17 ENCOUNTER — Ambulatory Visit

## 2025-10-02 ENCOUNTER — Encounter (INDEPENDENT_AMBULATORY_CARE_PROVIDER_SITE_OTHER): Admitting: Ophthalmology

## 2025-12-16 ENCOUNTER — Ambulatory Visit: Payer: Self-pay | Admitting: Neurology
